# Patient Record
Sex: Female | Born: 1945 | Race: Black or African American | Hispanic: No | Smoking: Never smoker
Health system: Southern US, Community
[De-identification: ages and names within clinical notes are randomized; demographics above are authoritative.]

## PROBLEM LIST (undated history)

## (undated) ENCOUNTER — Emergency Department (HOSPITAL_COMMUNITY): Payer: MEDICAID

## (undated) DIAGNOSIS — E119 Type 2 diabetes mellitus without complications: Secondary | ICD-10-CM

## (undated) DIAGNOSIS — C3490 Malignant neoplasm of unspecified part of unspecified bronchus or lung: Secondary | ICD-10-CM

## (undated) DIAGNOSIS — M1990 Unspecified osteoarthritis, unspecified site: Secondary | ICD-10-CM

## (undated) DIAGNOSIS — I10 Essential (primary) hypertension: Secondary | ICD-10-CM

## (undated) HISTORY — PX: ABDOMINAL HYSTERECTOMY: SHX81

## (undated) HISTORY — PX: ECTOPIC PREGNANCY SURGERY: SHX613

---

## 2019-12-30 ENCOUNTER — Inpatient Hospital Stay (HOSPITAL_COMMUNITY)
Admission: EM | Admit: 2019-12-30 | Discharge: 2020-01-05 | DRG: 180 | Disposition: A | Discharge status: Home / Self Care | Payer: Medicare Other | Attending: Internal Medicine | Admitting: Internal Medicine

## 2019-12-30 ENCOUNTER — Observation Stay (HOSPITAL_COMMUNITY): Payer: Medicare Other

## 2019-12-30 ENCOUNTER — Encounter (HOSPITAL_COMMUNITY): Payer: Self-pay

## 2019-12-30 ENCOUNTER — Other Ambulatory Visit: Payer: Self-pay

## 2019-12-30 ENCOUNTER — Emergency Department (HOSPITAL_COMMUNITY): Payer: Medicare Other

## 2019-12-30 DIAGNOSIS — J9819 Other pulmonary collapse: Secondary | ICD-10-CM

## 2019-12-30 DIAGNOSIS — Z9114 Patient's other noncompliance with medication regimen: Secondary | ICD-10-CM

## 2019-12-30 DIAGNOSIS — J9602 Acute respiratory failure with hypercapnia: Secondary | ICD-10-CM | POA: Diagnosis present

## 2019-12-30 DIAGNOSIS — Z90710 Acquired absence of both cervix and uterus: Secondary | ICD-10-CM

## 2019-12-30 DIAGNOSIS — Z20822 Contact with and (suspected) exposure to covid-19: Secondary | ICD-10-CM | POA: Diagnosis present

## 2019-12-30 DIAGNOSIS — C3432 Malignant neoplasm of lower lobe, left bronchus or lung: Secondary | ICD-10-CM | POA: Diagnosis not present

## 2019-12-30 DIAGNOSIS — R0600 Dyspnea, unspecified: Secondary | ICD-10-CM | POA: Diagnosis not present

## 2019-12-30 DIAGNOSIS — E1165 Type 2 diabetes mellitus with hyperglycemia: Secondary | ICD-10-CM | POA: Diagnosis present

## 2019-12-30 DIAGNOSIS — Z9889 Other specified postprocedural states: Secondary | ICD-10-CM

## 2019-12-30 DIAGNOSIS — Z87891 Personal history of nicotine dependence: Secondary | ICD-10-CM

## 2019-12-30 DIAGNOSIS — J90 Pleural effusion, not elsewhere classified: Secondary | ICD-10-CM | POA: Diagnosis present

## 2019-12-30 DIAGNOSIS — R0609 Other forms of dyspnea: Secondary | ICD-10-CM

## 2019-12-30 DIAGNOSIS — E119 Type 2 diabetes mellitus without complications: Secondary | ICD-10-CM

## 2019-12-30 DIAGNOSIS — I313 Pericardial effusion (noninflammatory): Secondary | ICD-10-CM | POA: Diagnosis present

## 2019-12-30 DIAGNOSIS — J910 Malignant pleural effusion: Secondary | ICD-10-CM

## 2019-12-30 DIAGNOSIS — E278 Other specified disorders of adrenal gland: Secondary | ICD-10-CM | POA: Diagnosis present

## 2019-12-30 DIAGNOSIS — I5022 Chronic systolic (congestive) heart failure: Secondary | ICD-10-CM | POA: Diagnosis present

## 2019-12-30 DIAGNOSIS — I110 Hypertensive heart disease with heart failure: Secondary | ICD-10-CM | POA: Diagnosis present

## 2019-12-30 DIAGNOSIS — J9601 Acute respiratory failure with hypoxia: Secondary | ICD-10-CM | POA: Diagnosis present

## 2019-12-30 DIAGNOSIS — I10 Essential (primary) hypertension: Secondary | ICD-10-CM

## 2019-12-30 DIAGNOSIS — R590 Localized enlarged lymph nodes: Secondary | ICD-10-CM | POA: Diagnosis present

## 2019-12-30 DIAGNOSIS — Z79899 Other long term (current) drug therapy: Secondary | ICD-10-CM

## 2019-12-30 DIAGNOSIS — J9811 Atelectasis: Secondary | ICD-10-CM | POA: Diagnosis present

## 2019-12-30 DIAGNOSIS — R634 Abnormal weight loss: Secondary | ICD-10-CM | POA: Diagnosis present

## 2019-12-30 DIAGNOSIS — I3139 Other pericardial effusion (noninflammatory): Secondary | ICD-10-CM | POA: Diagnosis present

## 2019-12-30 HISTORY — DX: Type 2 diabetes mellitus without complications: E11.9

## 2019-12-30 HISTORY — DX: Essential (primary) hypertension: I10

## 2019-12-30 HISTORY — DX: Unspecified osteoarthritis, unspecified site: M19.90

## 2019-12-30 LAB — CBC WITH DIFFERENTIAL/PLATELET
Abs Immature Granulocytes: 0.02 10*3/uL (ref 0.00–0.07)
Basophils Absolute: 0 10*3/uL (ref 0.0–0.1)
Basophils Relative: 0 %
Eosinophils Absolute: 0 10*3/uL (ref 0.0–0.5)
Eosinophils Relative: 0 %
HCT: 43.1 % (ref 36.0–46.0)
Hemoglobin: 13.7 g/dL (ref 12.0–15.0)
Immature Granulocytes: 0 %
Lymphocytes Relative: 15 %
Lymphs Abs: 1 10*3/uL (ref 0.7–4.0)
MCH: 28.1 pg (ref 26.0–34.0)
MCHC: 31.8 g/dL (ref 30.0–36.0)
MCV: 88.5 fL (ref 80.0–100.0)
Monocytes Absolute: 0.4 10*3/uL (ref 0.1–1.0)
Monocytes Relative: 5 %
Neutro Abs: 5.3 10*3/uL (ref 1.7–7.7)
Neutrophils Relative %: 80 %
Platelets: 364 10*3/uL (ref 150–400)
RBC: 4.87 MIL/uL (ref 3.87–5.11)
RDW: 12.6 % (ref 11.5–15.5)
WBC: 6.6 10*3/uL (ref 4.0–10.5)
nRBC: 0 % (ref 0.0–0.2)

## 2019-12-30 LAB — CBG MONITORING, ED
Glucose-Capillary: 312 mg/dL — ABNORMAL HIGH (ref 70–99)
Glucose-Capillary: 302 mg/dL — ABNORMAL HIGH (ref 70–99)

## 2019-12-30 LAB — COMPREHENSIVE METABOLIC PANEL
ALT: 46 U/L — ABNORMAL HIGH (ref 0–44)
AST: 29 U/L (ref 15–41)
Albumin: 3.7 g/dL (ref 3.5–5.0)
Alkaline Phosphatase: 143 U/L — ABNORMAL HIGH (ref 38–126)
Anion gap: 12 (ref 5–15)
BUN: 10 mg/dL (ref 8–23)
CO2: 24 mmol/L (ref 22–32)
Calcium: 9.4 mg/dL (ref 8.9–10.3)
Chloride: 99 mmol/L (ref 98–111)
Creatinine, Ser: 0.69 mg/dL (ref 0.44–1.00)
GFR, Estimated: >60 mL/min (ref >60)
Glucose, Bld: 309 mg/dL — ABNORMAL HIGH (ref 70–99)
Potassium: 3.7 mmol/L (ref 3.5–5.1)
Sodium: 135 mmol/L (ref 135–145)
Total Bilirubin: 0.6 mg/dL (ref 0.3–1.2)
Total Protein: 8.6 g/dL — ABNORMAL HIGH (ref 6.5–8.1)

## 2019-12-30 LAB — BODY FLUID CELL COUNT WITH DIFFERENTIAL
Eos, Fluid: 0 %
Lymphs, Fluid: 81 %
Monocyte-Macrophage-Serous Fluid: 13 % — ABNORMAL LOW (ref 50–90)
Neutrophil Count, Fluid: 6 % (ref 0–25)
Total Nucleated Cell Count, Fluid: 1187 cu mm — ABNORMAL HIGH (ref 0–1000)

## 2019-12-30 LAB — RESPIRATORY PANEL BY RT PCR (FLU A&B, COVID)
Influenza A by PCR: NEGATIVE
Influenza B by PCR: NEGATIVE
SARS Coronavirus 2 by RT PCR: NEGATIVE

## 2019-12-30 LAB — BLOOD GAS, VENOUS
Acid-Base Excess: 2.8 mmol/L — ABNORMAL HIGH (ref 0.0–2.0)
Bicarbonate: 25.5 mmol/L (ref 20.0–28.0)
FIO2: 28
O2 Saturation: 49.1 %
Patient temperature: 37
pCO2, Ven: 42.6 mmHg — ABNORMAL LOW (ref 44.0–60.0)
pH, Ven: 7.418 (ref 7.250–7.430)
pO2, Ven: 31.1 mmHg — CL (ref 32.0–45.0)

## 2019-12-30 LAB — PROTEIN, PLEURAL OR PERITONEAL FLUID: Total protein, fluid: 5.1 g/dL

## 2019-12-30 LAB — HEMOGLOBIN A1C
Hgb A1c MFr Bld: 10.7 % — ABNORMAL HIGH (ref 4.8–5.6)
Mean Plasma Glucose: 260.39 mg/dL

## 2019-12-30 LAB — LACTIC ACID, PLASMA: Lactic Acid, Venous: 1.7 mmol/L (ref 0.5–1.9)

## 2019-12-30 LAB — LACTATE DEHYDROGENASE: LDH: 242 U/L — ABNORMAL HIGH (ref 98–192)

## 2019-12-30 LAB — PROTEIN, TOTAL: Total Protein: 8.1 g/dL (ref 6.5–8.1)

## 2019-12-30 LAB — LACTATE DEHYDROGENASE, PLEURAL OR PERITONEAL FLUID: LD, Fluid: 264 U/L — ABNORMAL HIGH (ref 3–23)

## 2019-12-30 MED ORDER — AMLODIPINE BESYLATE 5 MG PO TABS
5.0000 mg | ORAL_TABLET | Freq: Every day | ORAL | Status: DC
  Administered 2019-12-30 – 2020-01-05 (×7): 5 mg via ORAL
  Filled 2019-12-30 (×7): qty 1

## 2019-12-30 MED ORDER — SODIUM CHLORIDE 0.9 % IV SOLN: INTRAVENOUS | Status: DC

## 2019-12-30 MED ORDER — LABETALOL HCL 5 MG/ML IV SOLN
20.0000 mg | INTRAVENOUS | Status: DC | PRN
  Administered 2019-12-30 (×2): 20 mg via INTRAVENOUS
  Filled 2019-12-30 (×2): qty 4

## 2019-12-30 MED ORDER — IOHEXOL 300 MG/ML  SOLN
75.0000 mL | Freq: Once | INTRAMUSCULAR | Status: AC | PRN
  Administered 2019-12-30: 75 mL via INTRAVENOUS

## 2019-12-30 MED ORDER — HYDRALAZINE HCL 20 MG/ML IJ SOLN
5.0000 mg | Freq: Four times a day (QID) | INTRAMUSCULAR | Status: DC | PRN
  Administered 2019-12-30: 5 mg via INTRAVENOUS
  Filled 2019-12-30: qty 1

## 2019-12-30 MED ORDER — INSULIN ASPART 100 UNIT/ML ~~LOC~~ SOLN
0.0000 [IU] | Freq: Three times a day (TID) | SUBCUTANEOUS | Status: DC
  Administered 2019-12-30: 11 [IU] via SUBCUTANEOUS
  Administered 2019-12-31: 5 [IU] via SUBCUTANEOUS
  Administered 2019-12-31: 8 [IU] via SUBCUTANEOUS
  Administered 2019-12-31 – 2020-01-01 (×2): 2 [IU] via SUBCUTANEOUS
  Administered 2020-01-01 (×2): 5 [IU] via SUBCUTANEOUS
  Administered 2020-01-02 (×2): 8 [IU] via SUBCUTANEOUS
  Administered 2020-01-02: 3 [IU] via SUBCUTANEOUS
  Administered 2020-01-03: 5 [IU] via SUBCUTANEOUS
  Administered 2020-01-03: 8 [IU] via SUBCUTANEOUS
  Administered 2020-01-04: 3 [IU] via SUBCUTANEOUS
  Administered 2020-01-04: 8 [IU] via SUBCUTANEOUS
  Filled 2019-12-30: qty 1

## 2019-12-30 MED ORDER — INSULIN ASPART 100 UNIT/ML ~~LOC~~ SOLN
0.0000 [IU] | Freq: Every day | SUBCUTANEOUS | Status: DC
  Administered 2019-12-30: 4 [IU] via SUBCUTANEOUS
  Administered 2020-01-03: 3 [IU] via SUBCUTANEOUS
  Administered 2020-01-04: 2 [IU] via SUBCUTANEOUS
  Filled 2019-12-30: qty 1

## 2019-12-30 NOTE — ED Notes (Signed)
Pt in CT.

## 2019-12-30 NOTE — ED Notes (Signed)
Date and time results received: 12/30/19 1138   Test: VBG, PO2 Critical Value: 31.1  Name of Provider Notified: Eulis Foster

## 2019-12-30 NOTE — ED Notes (Signed)
RN notified of patient blood pressure

## 2019-12-30 NOTE — Procedures (Signed)
PreOperative Dx: LEFT pleural effusion Postoperative Dx: LEFT pleural effusion Procedure:   US guided LEFT thoracentesis Radiologist:  Thornton Papas Anesthesia:  10 ml of 1% lidocaine Specimen:  1.08 L of yellow sl coudy colored fluid EBL:   < 1 ml Complications: None

## 2019-12-30 NOTE — ED Triage Notes (Signed)
Pt presents to ED with complaints of increased SOB x 2 days. Pt states she had went to Urgent Care and was treated for bronchitis.

## 2019-12-30 NOTE — Progress Notes (Signed)
Patient admitted to 308. She is currently on 2L O2 via [place]. Her respiratory rate is 20-23 MD aware, O2 sats 100% and other vitals stable at this time. She denies pain or discomfort, will continue to monitor.

## 2019-12-30 NOTE — H&P (Signed)
TRH H&P   Patient Demographics:    Sara Ferguson, is a 74 y.o. female  MRN: [id number]   DOB - [birth year]  Admit Date - 12/30/2019  Outpatient Primary MD for the patient is System, Provider Not In  Referring MD/NP/PA: Dr Eulis Foster    Patient coming from: home  Chief Complaint  Patient presents with  . Shortness of Breath      HPI:    Sara Ferguson  is a 74 y.o. female, with past medical history of hypertension, diabetes mellitus (stopped taking Metformin on her own), history of hysterectomy in 1984, patient presents to ED secondary to shortness of breath, reported this is an ongoing problem for last 2 weeks, she was recently treated for bronchitis by urgent care, reports no improvement of symptoms which prompted her to come back to ED, reports she is an ex-smoker, but 40 years ago, but reports husband was a heavy smoker, and recently passed away due to malignancy in August/2021, she does report some cough, nonproductive, reports weakness, fatigue, she denies fever, chills, chest pain, nausea or vomiting, no focal deficits, no hemoptysis, no sick contact, she did test negative for Covid yesterday, she is not vaccinated against Covid infection. -In ED her CT chest with contrast was significant for diffuse large pleural effusion, with surrounding lymphadenopathy, and an adrenal nodule, highly suspicious for malignancy, patient report last colonoscopy in 1984, mammogram was many years ago, reports hysterectomy in 1984, given she is dyspneic [facility] consulted to admit.    Review of systems:    In addition to the HPI above,  No Fever-chills, No Headache, No changes with Vision or hearing, No problems swallowing food or Liquids, No Chest pain, does report cough and shortness of breath. No Abdominal pain, No Nausea or Vommitting, Bowel movements are regular, No Blood in stool or  Urine, No dysuria, No new skin rashes or bruises, No new joints pains-aches,  No new weakness, tingling, numbness in any extremity, No recent weight gain or loss, No polyuria, polydypsia or polyphagia, No significant Mental Stressors.  A full 10 point Review of Systems was done, except as stated above, all other Review of Systems were negative.   With Past History of the following :    Past Medical History:  Diagnosis Date  . Arthritis   . Diabetes mellitus without complication ([place])   . Hypertension       Past Surgical History:  Procedure Laterality Date  . ABDOMINAL HYSTERECTOMY    . ECTOPIC PREGNANCY SURGERY        Social History:     Social History   Tobacco Use  . Smoking status: Never Smoker  . Smokeless tobacco: Never Used  Substance Use Topics  . Alcohol use: Not Currently        Family History :   She reports family history of lung cancer in her spouse recently.  As well  report mother died of liver cancer, but otherwise no family history of malignancy.   Home Medications:   Prior to Admission medications   Medication Sig Start Date End Date Taking? Authorizing Provider  albuterol (VENTOLIN HFA) 108 (90 Base) MCG/ACT inhaler Inhale into the lungs. 12/17/19  Yes [provider]  lisinopril (ZESTRIL) 2.5 MG tablet Take 2.5 mg by mouth daily. 10/23/19  Yes [provider]     Allergies:    No Known Allergies   Physical Exam:   Vitals  Blood pressure (!) 182/89, pulse 98, temperature 98.6 F (37 C), temperature source Oral, resp. rate (!) 27, height 5\' 4"  (1.626 m), weight 72.6 kg, SpO2 96 %.   1. General elderly female, sitting position due to dyspnea, in mild discomfort.    2. Normal affect and insight, Not Suicidal or Homicidal, Awake Alert, Oriented X 3.  3. No F.N deficits, ALL C.Nerves Intact, Strength 5/5 all 4 extremities, Sensation intact all 4 extremities, Plantars down going.  4. Ears and Eyes appear Normal,  Conjunctivae clear, PERRLA. Moist Oral Mucosa.  5. Supple Neck, No JVD, some fullness in the left axilla with suspicion of lymphadenopathy.  6. Symmetrical Chest wall movement, some increased work of breathing, significantly diminished air entry in the left side.  7. RRR, No Gallops, Rubs or Murmurs, No Parasternal Heave.  8. Positive Bowel Sounds, Abdomen Soft, No tenderness, No organomegaly appriciated,No rebound -guarding or rigidity.  9.  No Cyanosis, Normal Skin Turgor, No Skin Rash or Bruise.  10. Good muscle tone,  joints appear normal , no effusions, Normal ROM.   female chaperone RN Rolena Infante was present during physical exam and breast exam.   Data Review:    CBC Recent Labs  Lab 12/30/19 1118  WBC 6.6  HGB 13.7  HCT 43.1  PLT 364  MCV 88.5  MCH 28.1  MCHC 31.8  RDW 12.6  LYMPHSABS 1.0  MONOABS 0.4  EOSABS 0.0  BASOSABS 0.0   ------------------------------------------------------------------------------------------------------------------  Chemistries  Recent Labs  Lab 12/30/19 1118  NA 135  K 3.7  CL 99  CO2 24  GLUCOSE 309*  BUN 10  CREATININE 0.69  CALCIUM 9.4  AST 29  ALT 46*  ALKPHOS 143*  BILITOT 0.6   ------------------------------------------------------------------------------------------------------------------ estimated creatinine clearance is 58.5 mL/min (by C-G formula based on SCr of 0.69 mg/dL). ------------------------------------------------------------------------------------------------------------------ No results for input(s): TSH, T4TOTAL, T3FREE, THYROIDAB in the last 72 hours.  Invalid input(s): FREET3  Coagulation profile No results for input(s): INR, PROTIME in the last 168 hours. ------------------------------------------------------------------------------------------------------------------- No results for input(s): DDIMER in the last 72  hours. -------------------------------------------------------------------------------------------------------------------  Cardiac Enzymes No results for input(s): CKMB, TROPONINI, MYOGLOBIN in the last 168 hours.  Invalid input(s): CK ------------------------------------------------------------------------------------------------------------------ No results found for: BNP   ---------------------------------------------------------------------------------------------------------------  Urinalysis No results found for: COLORURINE, APPEARANCEUR, [place], [place], [place], [place], [place], [place], PROTEINUR, UROBILINOGEN, NITRITE, LEUKOCYTESUR  ----------------------------------------------------------------------------------------------------------------   Imaging Results:    CT Chest W Contrast  Result Date: 12/30/2019 CLINICAL DATA:  Large left pleural effusion on chest x-ray. EXAM: CT CHEST WITH CONTRAST TECHNIQUE: Multidetector CT imaging of the chest was performed during intravenous contrast administration. CONTRAST:  47mL OMNIPAQUE IOHEXOL 300 MG/ML  SOLN COMPARISON:  Chest x-ray earlier today. FINDINGS: Cardiovascular: Heart is enlarged. Small to moderate pericardial effusion. Atherosclerotic calcification is noted in the wall of the thoracic aorta. Mediastinum/Nodes: Supraclavicular, thoracic inlet, mediastinal, left internal mammary and bilateral hilar lymphadenopathy evident. Index precarinal lymph node measures 2.5 cm short axis on image  44/series 3. Subcarinal lymph node measures 2.0 cm short axis on 63/3. 1.6 cm short axis right hilar node visible on 57/3 1.6 cm soft tissue nodule in the anterior left hilum is probably a lymph node (image 50/3). Tiny hiatal hernia. The esophagus has normal imaging features. No right axillary lymphadenopathy. 13 mm short axis left subpectoral node is visible on 28/3 with mild left axillary lymphadenopathy evident. Lungs/Pleura: Large left  pleural effusion has anterior loculated component. Some aerated lung noted left upper lobe with otherwise near complete collapse of the left upper and lower lobes. Multiple small enhancing pleural nodules are seen in the posterior left hemithorax (paraspinal on 70/3, posterior on 107/3, and posterior on 113/3. Upper Abdomen: 2.4 cm left adrenal nodule cannot be definitively characterized. No definite focal lesion within the visualized liver. Musculoskeletal: No worrisome lytic or sclerotic osseous abnormality. No obvious left breast mass although the entire left breast has not been included in the field of view. IMPRESSION: 1. Large left pleural effusion has anterior loculated component and near complete collapse of the left upper and lower lobes. 2. Supraclavicular, mediastinal, bilateral hilar, and left subpectoral/axillary lymphadenopathy consistent with metastatic disease. No primary lesion evident although left lung cancer or left breast cancer (left breast incompletely visualized on this study) would be distinct considerations. 3. 2.4 cm left adrenal nodule cannot be definitively characterized. Metastatic disease a concern. 4. Small to moderate pericardial effusion. 5. Aortic Atherosclerosis (ICD10-I70.0). Electronically Signed   By: Misty Stanley M.D.   On: 12/30/2019 14:29   DG Chest Portable 1 View  Result Date: 12/30/2019 CLINICAL DATA:  74 year old female with shortness of breath EXAM: PORTABLE CHEST 1 VIEW COMPARISON:  None. FINDINGS: Opacity of the left chest obscures the left hemidiaphragm and the left heart border with meniscus at the top of the opacity. No pneumothorax. Coarsened interstitial markings. No confluent airspace disease within the right lung. IMPRESSION: Large left pleural effusion with associated atelectasis/consolidation. Electronically Signed   By: Corrie Mckusick D.O.   On: 12/30/2019 10:21       Assessment & Plan:    Active Problems:   Essential hypertension   Pleural  effusion, malignant   Pleural effusion    Malignant large left pleural effusion -Patient presents with progressive dyspnea, CT chest with evidence of large pleural effusion with near complete collapse of left upper and lower lobes, and metastatic lymph nodes in supraclavicular, mediastinal and bilateral hilar and left sup pectoral/axillary lymphadenopathy, 2.3 cm left adrenal nodule as well, with small to moderate pericardial effusion. -Is most likely malignant, so far it is unclear, given she is symptomatic with significant dyspnea, will proceed with thoracentesis and will check cytology. -We will obtain CT abdomen and pelvis tomorrow after IV hydration as she received IV contrast today. -Encouraged use incentive spirometer, -Reports she had hysterectomy in 1984, she had colonoscopy then as well, no colonoscopy since, reports she did not have any mammograms for so many years, she denies history of smoking, but reports husband history of heavy smoking for many years, he passed away this 10-21-2022 due to cancer.  Hypertension -Blood pressure is elevated due to discomfort, will resume home dose lisinopril, will add as needed hydralazine, and blood pressure remains significantly elevated so she will be started on 5 mg of Norvasc.  Diabetes mellitus, type II, uncontrolled with hyperglycemia -Reports history of diabetes mellitus, she is supposed to be on Metformin but she did stop it herself for a while, glucose is elevated at 309, she will be  started on insulin sliding scale during hospital stay, and likely will need to be started on glipizide upon discharge (unlikely Metformin as she did receive IV contrast), and she will be kept on insulin sliding scale during hospital stay .   DVT Prophylaxis Heparin -with a start date tomorrow afternoon  AM Labs Ordered, also please review Full Orders  Family Communication: Admission, patients condition and plan of care including tests being ordered have been  discussed with the patient  who indicate understanding and agree with the plan and Code Status.  Code Status Full  Likely DC to  Home  Condition GUARDED    Consults called: none    Admission status: observation    Time spent in minutes : 60 minutes   Phillips Climes M.D on 12/30/2019 at 4:40 PM   [facility] - Office  [phone]

## 2019-12-30 NOTE — ED Notes (Signed)
Pt in bed, pt placed on 2L O2 via [place], secondary to decreased O2 sat, pt denies pain at this time, blood and blood culture number one drawn via IV start.  Lab at bedside for blood culture number two.

## 2019-12-30 NOTE — ED Provider Notes (Signed)
[facility] EMERGENCY DEPARTMENT Provider Note   CSN: [id number] Arrival date & time: 12/30/19  1443     History Chief Complaint  Patient presents with   Shortness of Breath    Sara Ferguson is a 74 y.o. female.  HPI She presents for evaluation of shortness of breath for 2 weeks, without improvement after treated with antibiotics and guaifenesin for "bronchitis."  She followed up with the urgent care again, yesterday and was referred to the ER for further evaluation.  She is an ex-smoker, and is continued to produce clear sputum with cough.  She denies chest pain, fever, chills, nausea, vomiting, weakness or dizziness.  She has lost some weight in the last few weeks.  No known sick contacts.  She states she tested negative for Covid, yesterday.  She has not been vaccinated, against Covid infection.  There are no other known modifying factors.    Past Medical History:  Diagnosis Date   Arthritis    Diabetes mellitus without complication ([place])    Hypertension     There are no problems to display for this patient.   Past Surgical History:  Procedure Laterality Date   ABDOMINAL HYSTERECTOMY     ECTOPIC PREGNANCY SURGERY       OB History   No obstetric history on file.     No family history on file.  Social History   Tobacco Use   Smoking status: Never Smoker   Smokeless tobacco: Never Used  Substance Use Topics   Alcohol use: Not Currently   Drug use: Never    Home Medications Prior to Admission medications   Medication Sig Start Date End Date Taking? Authorizing Provider  albuterol (VENTOLIN HFA) 108 (90 Base) MCG/ACT inhaler Inhale into the lungs. 12/17/19  Yes [provider]  lisinopril (ZESTRIL) 2.5 MG tablet Take 2.5 mg by mouth daily. 10/23/19  Yes [provider]    Allergies    Patient has no known allergies.  Review of Systems   Review of Systems  All other systems reviewed and are negative.   Physical Exam Updated  Vital Signs BP (!) 165/80    Pulse 98    Temp 98.6 F (37 C) (Oral)    Resp (!) 22    Ht $R'5\' 4"'HB$  (1.626 m)    Wt 72.6 kg    SpO2 94%    BMI 27.46 kg/m   Physical Exam Vitals and nursing note reviewed.  Constitutional:      General: She is in acute distress.     Appearance: She is well-developed. She is ill-appearing. She is not toxic-appearing or diaphoretic.  HENT:     Head: Normocephalic and atraumatic.     Right Ear: External ear normal.     Left Ear: External ear normal.  Eyes:     Conjunctiva/sclera: Conjunctivae normal.     Pupils: Pupils are equal, round, and reactive to light.  Neck:     Trachea: Phonation normal.  Cardiovascular:     Rate and Rhythm: Normal rate and regular rhythm.     Heart sounds: Normal heart sounds.  Pulmonary:     Effort: Pulmonary effort is normal. No respiratory distress.     Comments: Tachypneic.  Decreased air movement, left.  No wheezes.  No increased work of breathing. Chest:     Comments: Breast exam: Bilateral normal-appearing breast, with normal nipples.  On palpation, nonspecific nodularity, some of which is plate-like, on both breasts.  No distinct areas of large mass concerning  for tumor. Abdominal:     General: There is no distension.     Palpations: Abdomen is soft.     Tenderness: There is no abdominal tenderness.  Musculoskeletal:        General: No tenderness. Normal range of motion.     Cervical back: Normal range of motion and neck supple.     Right lower leg: Edema present.     Left lower leg: Edema present.     Comments: 1+ bilateral lower extremity edema  Skin:    General: Skin is warm and dry.  Neurological:     Mental Status: She is alert and oriented to person, place, and time.     Cranial Nerves: No cranial nerve deficit.     Sensory: No sensory deficit.     Motor: No abnormal muscle tone.     Coordination: Coordination normal.  Psychiatric:        Mood and Affect: Mood normal.        Behavior: Behavior normal.          Thought Content: Thought content normal.        Judgment: Judgment normal.     ED Results / Procedures / Treatments   Labs (all labs ordered are listed, but only abnormal results are displayed) Labs Reviewed  BLOOD GAS, VENOUS - Abnormal; Notable for the following components:      Result Value   pCO2, Ven 42.6 (*)    pO2, Ven 31.1 (*)    Acid-Base Excess 2.8 (*)    All other components within normal limits  COMPREHENSIVE METABOLIC PANEL - Abnormal; Notable for the following components:   Glucose, Bld 309 (*)    Total Protein 8.6 (*)    ALT 46 (*)    Alkaline Phosphatase 143 (*)    All other components within normal limits  CULTURE, BLOOD (ROUTINE X 2)  CULTURE, BLOOD (ROUTINE X 2)  CBC WITH DIFFERENTIAL/PLATELET  LACTIC ACID, PLASMA    EKG None    Date: 12/30/2019  Rate: 127  Rhythm: sinus tachycardia  QRS Axis: left  PR and QT Intervals: normal  ST/T Wave abnormalities: normal  PR and QRS Conduction Disutrbances:none     Radiology CT Chest W Contrast  Result Date: 12/30/2019 CLINICAL DATA:  Large left pleural effusion on chest x-ray. EXAM: CT CHEST WITH CONTRAST TECHNIQUE: Multidetector CT imaging of the chest was performed during intravenous contrast administration. CONTRAST:  72mL OMNIPAQUE IOHEXOL 300 MG/ML  SOLN COMPARISON:  Chest x-ray earlier today. FINDINGS: Cardiovascular: Heart is enlarged. Small to moderate pericardial effusion. Atherosclerotic calcification is noted in the wall of the thoracic aorta. Mediastinum/Nodes: Supraclavicular, thoracic inlet, mediastinal, left internal mammary and bilateral hilar lymphadenopathy evident. Index precarinal lymph node measures 2.5 cm short axis on image 44/series 3. Subcarinal lymph node measures 2.0 cm short axis on 63/3. 1.6 cm short axis right hilar node visible on 57/3 1.6 cm soft tissue nodule in the anterior left hilum is probably a lymph node (image 50/3). Tiny hiatal hernia. The esophagus has normal imaging  features. No right axillary lymphadenopathy. 13 mm short axis left subpectoral node is visible on 28/3 with mild left axillary lymphadenopathy evident. Lungs/Pleura: Large left pleural effusion has anterior loculated component. Some aerated lung noted left upper lobe with otherwise near complete collapse of the left upper and lower lobes. Multiple small enhancing pleural nodules are seen in the posterior left hemithorax (paraspinal on 70/3, posterior on 107/3, and posterior on 113/3. Upper Abdomen: 2.4 cm  left adrenal nodule cannot be definitively characterized. No definite focal lesion within the visualized liver. Musculoskeletal: No worrisome lytic or sclerotic osseous abnormality. No obvious left breast mass although the entire left breast has not been included in the field of view. IMPRESSION: 1. Large left pleural effusion has anterior loculated component and near complete collapse of the left upper and lower lobes. 2. Supraclavicular, mediastinal, bilateral hilar, and left subpectoral/axillary lymphadenopathy consistent with metastatic disease. No primary lesion evident although left lung cancer or left breast cancer (left breast incompletely visualized on this study) would be distinct considerations. 3. 2.4 cm left adrenal nodule cannot be definitively characterized. Metastatic disease a concern. 4. Small to moderate pericardial effusion. 5. Aortic Atherosclerosis (ICD10-I70.0). Electronically Signed   By: Misty Stanley M.D.   On: 12/30/2019 14:29   DG Chest Portable 1 View  Result Date: 12/30/2019 CLINICAL DATA:  74 year old female with shortness of breath EXAM: PORTABLE CHEST 1 VIEW COMPARISON:  None. FINDINGS: Opacity of the left chest obscures the left hemidiaphragm and the left heart border with meniscus at the top of the opacity. No pneumothorax. Coarsened interstitial markings. No confluent airspace disease within the right lung. IMPRESSION: Large left pleural effusion with associated  atelectasis/consolidation. Electronically Signed   By: Corrie Mckusick D.O.   On: 12/30/2019 10:21    Procedures .Critical Care Performed by: Daleen Bo, MD Authorized by: Daleen Bo, MD   Critical care provider statement:    Critical care time (minutes):  50   Critical care start time:  12/30/2019 10:55 AM   Critical care end time:  12/30/2019 3:13 PM   Critical care time was exclusive of:  Separately billable procedures and treating other patients   Critical care was necessary to treat or prevent imminent or life-threatening deterioration of the following conditions:  Respiratory failure   Critical care was time spent personally by me on the following activities:  Blood draw for specimens, development of treatment plan with patient or surrogate, discussions with consultants, evaluation of patient's response to treatment, examination of patient, obtaining history from patient or surrogate, ordering and performing treatments and interventions, ordering and review of laboratory studies, pulse oximetry, re-evaluation of patient's condition, review of old charts and ordering and review of radiographic studies   (including critical care time)  Medications Ordered in ED Medications  0.9 %  sodium chloride infusion ( Intravenous New Bag/Given 12/30/19 1124)  labetalol (NORMODYNE) injection 20 mg (20 mg Intravenous Given 12/30/19 1423)  iohexol (OMNIPAQUE) 300 MG/ML solution 75 mL (75 mLs Intravenous Contrast Given 12/30/19 1355)    ED Course  I have reviewed the triage vital signs and the nursing notes.  Pertinent labs & imaging results that were available during my care of the patient were reviewed by me and considered in my medical decision making (see chart for details).      Clinical Course as of Dec 30 1511  Fri Dec 30, 2019  1438 Patient with initial room air oxygen saturation 92% on arrival.  Trial now, off oxygen, within 5 minutes she became tachypneic and had increased  work of breathing and became subjectively dyspneic.  Oxygen replaced for comfort.   [EW]    Clinical Course User Index [EW] Daleen Bo, MD   MDM Rules/Calculators/A&P                           Patient Vitals for the past 24 hrs:  BP Temp Temp src Pulse Resp SpO2  Height Weight  12/30/19 1446 (!) 165/80 -- -- 98 (!) 22 94 % -- --  12/30/19 1425 (!) 178/94 98.6 F (37 C) Oral (!) 101 (!) 23 100 % -- --  12/30/19 1341 (!) 162/94 -- -- (!) 101 (!) 24 94 % -- --  12/30/19 1330 (!) 162/80 -- -- 100 (!) 24 97 % -- --  12/30/19 1315 (!) 190/82 -- -- (!) 118 (!) 24 100 % -- --  12/30/19 1313 (!) 190/82 99 F (37.2 C) Oral (!) 124 (!) 24 98 % -- --  12/30/19 1230 (!) 220/114 -- -- (!) 126 (!) 30 99 % -- --  12/30/19 1215 -- -- -- (!) 122 (!) 30 99 % -- --  12/30/19 1201 (!) 213/105 -- -- (!) 124 (!) 27 99 % -- --  12/30/19 1200 (!) 201/100 -- -- (!) 123 (!) 31 99 % -- --  12/30/19 1145 -- -- -- (!) 123 (!) 30 98 % -- --  12/30/19 1130 (!) 201/92 -- -- (!) 123 (!) 23 99 % -- --  12/30/19 1126 -- -- -- -- -- 98 % -- --  12/30/19 1125 (!) 229/110 98.6 F (37 C) Oral (!) 129 (!) 28 92 % -- --  12/30/19 1115 -- -- -- (!) 123 (!) 33 94 % -- --  12/30/19 1100 (!) 228/107 -- -- (!) 123 (!) 29 92 % -- --  12/30/19 1056 (!) 218/113 98.7 F (37.1 C) Oral (!) 123 (!) 27 92 % -- --  12/30/19 1045 -- -- -- (!) 123 (!) 28 91 % -- --  12/30/19 1030 (!) 212/108 -- -- (!) 123 (!) 26 91 % -- --  12/30/19 0957 (!) 212/114 98.1 F (36.7 C) Oral (!) 127 (!) 24 93 % -- --  12/30/19 0956 -- -- -- -- -- -- $Rem'5\' 4"'VBZe$  (1.626 m) 72.6 kg    3:07 PM Reevaluation with update and discussion. After initial assessment and treatment, an updated evaluation reveals patient is uncomfortable.  She is agreeable to hospitalization.  Blood pressure improved after IV labetalol.Daleen Bo   Medical Decision Making:  This patient is presenting for evaluation of shortness of breath, which does require a range of treatment  options, and is a complaint that involves a high risk of morbidity and mortality. The differential diagnoses include pneumonia, bronchitis, acute lung disorder. I decided to review old records, and in summary elderly female, recently treated for bronchitis without improvement presenting for persistent symptoms.  I did not require additional historical information from anyone.  Clinical Laboratory Tests Ordered, included lactate, CBC, Metabolic panel and Urinalysis. Review indicates normal lactate, glucose elevated, ALT high, alk phos  high. Radiologic Tests Ordered, included chest x-ray, CT chest.  I independently Visualized: Radiographic images, which show left pleural effusion, left lung collapse, adenopathy left chest  Cardiac Monitor Tracing which shows normal sinus rhythm    Critical Interventions-clinical evaluation, laboratory testing, chest x-ray, advanced imaging chest, stabilization of blood pressure with IV labetalol observation reassessment  After These Interventions, the Patient was reevaluated and was found to be uncomfortable will require hospitalization for symptomatic pleural effusion.  Initial evaluation is concerning for metastatic lung cancer, etiology not clear.  Abnormal breast exam without clear evidence for breast cancer.  She has had mammograms in the past but none recently.  While not hypoxic, she does develop respiratory distress with removal oxygen, and has significant lung collapse, which will likely require hospitalization, and likely therapeutic/diagnostic thoracocentesis.  Patient  does not currently have a cancer history.  CRITICAL CARE-yes Performed by: Daleen Bo  Nursing Notes Reviewed/ Care Coordinated Applicable Imaging Reviewed Interpretation of Laboratory Data incorporated into ED treatment  3:18 PM-Consult complete with hospitalist. Patient case explained and discussed.  He agrees to admit patient for further evaluation and treatment. Call ended at  4:15 PM    Final Clinical Impression(s) / ED Diagnoses Final diagnoses:  Pleural effusion  Lung collapse  Hypertension, unspecified type  Collapse of left lung    Rx / DC Orders ED Discharge Orders    None       Daleen Bo, MD 12/30/19 1623

## 2019-12-31 ENCOUNTER — Observation Stay (HOSPITAL_COMMUNITY): Payer: Medicare Other

## 2019-12-31 ENCOUNTER — Encounter (HOSPITAL_COMMUNITY): Payer: Self-pay | Admitting: Radiology

## 2019-12-31 DIAGNOSIS — Z9114 Patient's other noncompliance with medication regimen: Secondary | ICD-10-CM | POA: Diagnosis not present

## 2019-12-31 DIAGNOSIS — R0600 Dyspnea, unspecified: Secondary | ICD-10-CM | POA: Diagnosis present

## 2019-12-31 DIAGNOSIS — J9602 Acute respiratory failure with hypercapnia: Secondary | ICD-10-CM | POA: Diagnosis present

## 2019-12-31 DIAGNOSIS — E1165 Type 2 diabetes mellitus with hyperglycemia: Secondary | ICD-10-CM | POA: Diagnosis present

## 2019-12-31 DIAGNOSIS — R590 Localized enlarged lymph nodes: Secondary | ICD-10-CM | POA: Diagnosis present

## 2019-12-31 DIAGNOSIS — J910 Malignant pleural effusion: Secondary | ICD-10-CM | POA: Diagnosis present

## 2019-12-31 DIAGNOSIS — E119 Type 2 diabetes mellitus without complications: Secondary | ICD-10-CM

## 2019-12-31 DIAGNOSIS — I5022 Chronic systolic (congestive) heart failure: Secondary | ICD-10-CM | POA: Diagnosis present

## 2019-12-31 DIAGNOSIS — C3432 Malignant neoplasm of lower lobe, left bronchus or lung: Secondary | ICD-10-CM | POA: Diagnosis present

## 2019-12-31 DIAGNOSIS — I110 Hypertensive heart disease with heart failure: Secondary | ICD-10-CM | POA: Diagnosis present

## 2019-12-31 DIAGNOSIS — E278 Other specified disorders of adrenal gland: Secondary | ICD-10-CM | POA: Diagnosis present

## 2019-12-31 DIAGNOSIS — I3139 Other pericardial effusion (noninflammatory): Secondary | ICD-10-CM | POA: Diagnosis present

## 2019-12-31 DIAGNOSIS — Z90710 Acquired absence of both cervix and uterus: Secondary | ICD-10-CM | POA: Diagnosis not present

## 2019-12-31 DIAGNOSIS — J9601 Acute respiratory failure with hypoxia: Secondary | ICD-10-CM | POA: Diagnosis present

## 2019-12-31 DIAGNOSIS — R634 Abnormal weight loss: Secondary | ICD-10-CM | POA: Diagnosis present

## 2019-12-31 DIAGNOSIS — J90 Pleural effusion, not elsewhere classified: Secondary | ICD-10-CM | POA: Diagnosis present

## 2019-12-31 DIAGNOSIS — J9811 Atelectasis: Secondary | ICD-10-CM | POA: Diagnosis present

## 2019-12-31 DIAGNOSIS — Z20822 Contact with and (suspected) exposure to covid-19: Secondary | ICD-10-CM | POA: Diagnosis present

## 2019-12-31 DIAGNOSIS — Z79899 Other long term (current) drug therapy: Secondary | ICD-10-CM | POA: Diagnosis not present

## 2019-12-31 DIAGNOSIS — I313 Pericardial effusion (noninflammatory): Secondary | ICD-10-CM | POA: Diagnosis present

## 2019-12-31 DIAGNOSIS — Z87891 Personal history of nicotine dependence: Secondary | ICD-10-CM | POA: Diagnosis not present

## 2019-12-31 LAB — GLUCOSE, CAPILLARY
Glucose-Capillary: 127 mg/dL — ABNORMAL HIGH (ref 70–99)
Glucose-Capillary: 221 mg/dL — ABNORMAL HIGH (ref 70–99)
Glucose-Capillary: 275 mg/dL — ABNORMAL HIGH (ref 70–99)
Glucose-Capillary: 143 mg/dL — ABNORMAL HIGH (ref 70–99)

## 2019-12-31 LAB — CBC
HCT: 42.3 % (ref 36.0–46.0)
Hemoglobin: 13.3 g/dL (ref 12.0–15.0)
MCH: 28.2 pg (ref 26.0–34.0)
MCHC: 31.4 g/dL (ref 30.0–36.0)
MCV: 89.8 fL (ref 80.0–100.0)
Platelets: 372 10*3/uL (ref 150–400)
RBC: 4.71 MIL/uL (ref 3.87–5.11)
RDW: 12.9 % (ref 11.5–15.5)
WBC: 7 10*3/uL (ref 4.0–10.5)
nRBC: 0 % (ref 0.0–0.2)

## 2019-12-31 LAB — COMPREHENSIVE METABOLIC PANEL
ALT: 35 U/L (ref 0–44)
AST: 21 U/L (ref 15–41)
Albumin: 3.1 g/dL — ABNORMAL LOW (ref 3.5–5.0)
Alkaline Phosphatase: 114 U/L (ref 38–126)
Anion gap: 10 (ref 5–15)
BUN: 16 mg/dL (ref 8–23)
CO2: 25 mmol/L (ref 22–32)
Calcium: 9.3 mg/dL (ref 8.9–10.3)
Chloride: 102 mmol/L (ref 98–111)
Creatinine, Ser: 0.7 mg/dL (ref 0.44–1.00)
GFR, Estimated: >60 mL/min (ref >60)
Glucose, Bld: 131 mg/dL — ABNORMAL HIGH (ref 70–99)
Potassium: 4 mmol/L (ref 3.5–5.1)
Sodium: 137 mmol/L (ref 135–145)
Total Bilirubin: 0.7 mg/dL (ref 0.3–1.2)
Total Protein: 7.4 g/dL (ref 6.5–8.1)

## 2019-12-31 MED ORDER — IPRATROPIUM-ALBUTEROL 0.5-2.5 (3) MG/3ML IN SOLN
3.0000 mL | Freq: Three times a day (TID) | RESPIRATORY_TRACT | Status: DC
  Administered 2020-01-01 – 2020-01-04 (×10): 3 mL via RESPIRATORY_TRACT
  Filled 2019-12-31 (×9): qty 3

## 2019-12-31 MED ORDER — HEPARIN SODIUM (PORCINE) 5000 UNIT/ML IJ SOLN
5000.0000 [IU] | Freq: Three times a day (TID) | INTRAMUSCULAR | Status: DC
  Administered 2019-12-31 – 2020-01-05 (×15): 5000 [IU] via SUBCUTANEOUS
  Filled 2019-12-31 (×16): qty 1

## 2019-12-31 MED ORDER — FUROSEMIDE 10 MG/ML IJ SOLN
40.0000 mg | Freq: Once | INTRAMUSCULAR | Status: AC
  Administered 2019-12-31: 40 mg via INTRAVENOUS
  Filled 2019-12-31: qty 4

## 2019-12-31 MED ORDER — IOHEXOL 300 MG/ML  SOLN
80.0000 mL | Freq: Once | INTRAMUSCULAR | Status: AC | PRN
  Administered 2019-12-31: 80 mL via INTRAVENOUS

## 2019-12-31 MED ORDER — ACETAMINOPHEN 650 MG RE SUPP: 650.0000 mg | Freq: Four times a day (QID) | RECTAL | Status: DC | PRN

## 2019-12-31 MED ORDER — IPRATROPIUM-ALBUTEROL 0.5-2.5 (3) MG/3ML IN SOLN
3.0000 mL | Freq: Four times a day (QID) | RESPIRATORY_TRACT | Status: DC
  Administered 2019-12-31: 3 mL via RESPIRATORY_TRACT
  Filled 2019-12-31: qty 3

## 2019-12-31 MED ORDER — LISINOPRIL 5 MG PO TABS
2.5000 mg | ORAL_TABLET | Freq: Every day | ORAL | Status: DC
  Administered 2019-12-31 – 2020-01-02 (×3): 2.5 mg via ORAL
  Filled 2019-12-31 (×3): qty 1

## 2019-12-31 MED ORDER — HYDROCODONE-ACETAMINOPHEN 5-325 MG PO TABS: 1.0000 | ORAL_TABLET | ORAL | Status: DC | PRN

## 2019-12-31 MED ORDER — ACETAMINOPHEN 325 MG PO TABS: 650.0000 mg | ORAL_TABLET | Freq: Four times a day (QID) | ORAL | Status: DC | PRN

## 2019-12-31 NOTE — Progress Notes (Signed)
   12/31/19 0300  Assess: MEWS Score  Temp 98 F (36.7 C)  BP 129/82  Pulse Rate 97  Resp 19  Level of Consciousness Alert  SpO2 96 %  O2 Device Nasal Cannula  O2 Flow Rate (L/min) 2 L/min  Assess: MEWS Score  MEWS Temp 0  MEWS Systolic 0  MEWS Pulse 0  MEWS RR 0  MEWS LOC 0  MEWS Score 0  MEWS Score Color Green  Treat  Pain Scale 0-10  Pain Score 0  Document  Patient Outcome Stabilized after interventions   Patient states she feels much better, is resting comfortably at this time. Q2 hour vitals have been completed x2 and Q4 will resume per unit protocol. Will continue to monitor.

## 2019-12-31 NOTE — Progress Notes (Signed)
PROGRESS NOTE    Sara Ferguson  MAU:[id number] DOB: November 08, 1945 DOA: 12/30/2019 PCP: System, Provider Not In    Brief Narrative:  74 year old female with a history of hypertension, diabetes, presents to the emergency room with progressive shortness of breath.  She was found to have large left-sided pleural effusion as well as pericardial effusion and was admitted for further treatment.   Assessment & Plan:   Active Problems:   Essential hypertension   Pleural effusion, malignant   Pleural effusion   Pleural effusion, left   Type 2 diabetes mellitus without complication (HCC)   Acute respiratory failure with hypoxia (HCC)   Pericardial effusion   Large left pleural effusion -Concern for underlying malignancy -Status post thoracentesis on 11/12 -Cytology in process -Appears to be exudative on fluid analysis  Acute respiratory failure with hypoxia -Likely related to pleural effusion -She is status post thoracentesis -Still requiring supplemental oxygen -She is still dyspneic on exertion as well as with conversation -Continue to wean off oxygen as tolerated  Pericardial effusion with bilateral pleural effusions -She does have some evidence of volume overload with peripheral edema -May also have concurrent CHF -Check BNP -Check echocardiogram -We will give a dose of IV Lasix  Type 2 diabetes -A1c 10.7 -Currently on sliding scale insulin -Continue to monitor  Hypertension -Currently on amlodipine and lisinopril -Blood pressure currently stable   DVT prophylaxis: heparin injection 5,000 Units Start: 12/31/19 1400 SCDs Start: 12/31/19 0218  Code Status: Full code Family Communication: Discussed with patient.  She did not want me to contact any family Disposition Plan: Status is: Inpatient  Remains inpatient appropriate because:Inpatient level of care appropriate due to severity of illness   Dispo: The patient is from: Home              Anticipated d/c is to:  Home              Anticipated d/c date is: 2 days              Patient currently is not medically stable to d/c.         Consultants:     Procedures:   11/12 ultrasound-guided thoracentesis with removal of 1.08 L of fluid  Antimicrobials:      Subjective: Initially felt better after thoracentesis.  Since then, she feels like she is developing worsening shortness of breath again.  She is having difficulty completing a sentence.  Objective: Vitals:   12/31/19 0300 12/31/19 0900 12/31/19 0908 12/31/19 1457  BP: 129/82 (!) 142/60 140/64 (!) 168/78  Pulse: 97 (!) 111 98 97  Resp: 19 19 18 17   Temp: 98 F (36.7 C) 99.2 F (37.3 C) 99 F (37.2 C) 98.3 F (36.8 C)  TempSrc: Oral Oral    SpO2: 96% 99% 99% 98%  Weight:      Height:        Intake/Output Summary (Last 24 hours) at 12/31/2019 1954 Last data filed at 12/31/2019 1700 Gross per 24 hour  Intake 1310.56 ml  Output --  Net 1310.56 ml   Filed Weights   12/30/19 0956 12/30/19 2320  Weight: 72.6 kg 72.6 kg    Examination:  General exam: Appears calm and comfortable  Respiratory system: Crackles at bases with diminished breath sounds at left base. Respiratory effort normal. Cardiovascular system: S1 & S2 heard, RRR. No JVD, murmurs, rubs, gallops or clicks.  1+ pitting edema bilaterally Gastrointestinal system: Abdomen is nondistended, soft and nontender. No organomegaly or masses felt. Normal  bowel sounds heard. Central nervous system: Alert and oriented. No focal neurological deficits. Extremities: Symmetric 5 x 5 power. Skin: No rashes, lesions or ulcers Psychiatry: Judgement and insight appear normal. Mood & affect appropriate.     Data Reviewed: I have personally reviewed following labs and imaging studies  CBC: Recent Labs  Lab 12/30/19 1118 12/31/19 0656  WBC 6.6 7.0  NEUTROABS 5.3  --   HGB 13.7 13.3  HCT 43.1 42.3  MCV 88.5 89.8  PLT 364 732   Basic Metabolic Panel: Recent Labs   Lab 12/30/19 1118 12/31/19 0656  NA 135 137  K 3.7 4.0  CL 99 102  CO2 24 25  GLUCOSE 309* 131*  BUN 10 16  CREATININE 0.69 0.70  CALCIUM 9.4 9.3   GFR: Estimated Creatinine Clearance: 58.5 mL/min (by C-G formula based on SCr of 0.7 mg/dL). Liver Function Tests: Recent Labs  Lab 12/30/19 1118 12/30/19 1646 12/31/19 0656  AST 29  --  21  ALT 46*  --  35  ALKPHOS 143*  --  114  BILITOT 0.6  --  0.7  PROT 8.6* 8.1 7.4  ALBUMIN 3.7  --  3.1*   No results for input(s): LIPASE, AMYLASE in the last 168 hours. No results for input(s): AMMONIA in the last 168 hours. Coagulation Profile: No results for input(s): INR, PROTIME in the last 168 hours. Cardiac Enzymes: No results for input(s): CKTOTAL, CKMB, CKMBINDEX, TROPONINI in the last 168 hours. BNP (last 3 results) No results for input(s): PROBNP in the last 8760 hours. HbA1C: Recent Labs    12/30/19 1118  HGBA1C 10.7*   CBG: Recent Labs  Lab 12/30/19 1658 12/30/19 2216 12/31/19 0807 12/31/19 1116 12/31/19 1657  GLUCAP 312* 302* 127* 221* 275*   Lipid Profile: No results for input(s): CHOL, HDL, LDLCALC, TRIG, CHOLHDL, LDLDIRECT in the last 72 hours. Thyroid Function Tests: No results for input(s): TSH, T4TOTAL, FREET4, T3FREE, THYROIDAB in the last 72 hours. Anemia Panel: No results for input(s): VITAMINB12, FOLATE, FERRITIN, TIBC, IRON, RETICCTPCT in the last 72 hours. Sepsis Labs: Recent Labs  Lab 12/30/19 1118  LATICACIDVEN 1.7    Recent Results (from the past 240 hour(s))  Culture, blood (routine x 2)     Status: None (Preliminary result)   Collection Time: 12/30/19 11:19 AM   Specimen: Left Antecubital; Blood  Result Value Ref Range Status   Specimen Description   Final    LEFT ANTECUBITAL BOTTLES DRAWN AEROBIC AND ANAEROBIC   Special Requests Blood Culture adequate volume  Final   Culture   Final    NO GROWTH < 24 HOURS Performed at [facility], [address] [place], [place] [address]     Report Status PENDING  Incomplete  Culture, blood (routine x 2)     Status: None (Preliminary result)   Collection Time: 12/30/19 11:20 AM   Specimen: Right Antecubital; Blood  Result Value Ref Range Status   Specimen Description   Final    RIGHT ANTECUBITAL BOTTLES DRAWN AEROBIC AND ANAEROBIC   Special Requests   Final    Blood Culture results may not be optimal due to an inadequate volume of blood received in culture bottles   Culture   Final    NO GROWTH < 24 HOURS Performed at [facility], [address] [place],  [address]    Report Status PENDING  Incomplete  Respiratory Panel by RT PCR (Flu A&B, Covid) - Nasopharyngeal Swab     Status: None  Collection Time: 12/30/19  4:18 PM   Specimen: Nasopharyngeal Swab  Result Value Ref Range Status   SARS Coronavirus 2 by RT PCR NEGATIVE NEGATIVE Final    Comment: (NOTE) SARS-CoV-2 target nucleic acids are NOT DETECTED.  The SARS-CoV-2 RNA is generally detectable in upper respiratoy specimens during the acute phase of infection. The lowest concentration of SARS-CoV-2 viral copies this assay can detect is 131 copies/mL. A negative result does not preclude SARS-Cov-2 infection and should not be used as the sole basis for treatment or other patient management decisions. A negative result may occur with  improper specimen collection/handling, submission of specimen other than nasopharyngeal swab, presence of viral mutation(s) within the areas targeted by this assay, and inadequate number of viral copies (<131 copies/mL). A negative result must be combined with clinical observations, patient history, and epidemiological information. The expected result is Negative.  Fact Sheet for Patients:  PinkCheek.be  Fact Sheet for Healthcare Providers:  GravelBags.it  This test is no t yet approved or cleared by the [place] FDA and  has been authorized for detection and/or  diagnosis of SARS-CoV-2 by FDA under an Emergency Use Authorization (EUA). This EUA will remain  in effect (meaning this test can be used) for the duration of the COVID-19 declaration under Section 564(b)(1) of the Act, 21 U.S.C. section 360bbb-3(b)(1), unless the authorization is terminated or revoked sooner.     Influenza A by PCR NEGATIVE NEGATIVE Final   Influenza B by PCR NEGATIVE NEGATIVE Final    Comment: (NOTE) The Xpert Xpress SARS-CoV-2/FLU/RSV assay is intended as an aid in  the diagnosis of influenza from Nasopharyngeal swab specimens and  should not be used as a sole basis for treatment. Nasal washings and  aspirates are unacceptable for Xpert Xpress SARS-CoV-2/FLU/RSV  testing.  Fact Sheet for Patients: PinkCheek.be  Fact Sheet for Healthcare Providers: GravelBags.it  This test is not yet approved or cleared by the [place] FDA and  has been authorized for detection and/or diagnosis of SARS-CoV-2 by  FDA under an Emergency Use Authorization (EUA). This EUA will remain  in effect (meaning this test can be used) for the duration of the  Covid-19 declaration under Section 564(b)(1) of the Act, 21  U.S.C. section 360bbb-3(b)(1), unless the authorization is  terminated or revoked. Performed at [facility], [address] [place],  [address]          Radiology Studies: CT Chest W Contrast  Result Date: 12/30/2019 CLINICAL DATA:  Large left pleural effusion on chest x-ray. EXAM: CT CHEST WITH CONTRAST TECHNIQUE: Multidetector CT imaging of the chest was performed during intravenous contrast administration. CONTRAST:  80mL OMNIPAQUE IOHEXOL 300 MG/ML  SOLN COMPARISON:  Chest x-ray earlier today. FINDINGS: Cardiovascular: Heart is enlarged. Small to moderate pericardial effusion. Atherosclerotic calcification is noted in the wall of the thoracic aorta. Mediastinum/Nodes: Supraclavicular, thoracic  inlet, mediastinal, left internal mammary and bilateral hilar lymphadenopathy evident. Index precarinal lymph node measures 2.5 cm short axis on image 44/series 3. Subcarinal lymph node measures 2.0 cm short axis on 63/3. 1.6 cm short axis right hilar node visible on 57/3 1.6 cm soft tissue nodule in the anterior left hilum is probably a lymph node (image 50/3). Tiny hiatal hernia. The esophagus has normal imaging features. No right axillary lymphadenopathy. 13 mm short axis left subpectoral node is visible on 28/3 with mild left axillary lymphadenopathy evident. Lungs/Pleura: Large left pleural effusion has anterior loculated component. Some aerated lung noted left upper lobe with  otherwise near complete collapse of the left upper and lower lobes. Multiple small enhancing pleural nodules are seen in the posterior left hemithorax (paraspinal on 70/3, posterior on 107/3, and posterior on 113/3. Upper Abdomen: 2.4 cm left adrenal nodule cannot be definitively characterized. No definite focal lesion within the visualized liver. Musculoskeletal: No worrisome lytic or sclerotic osseous abnormality. No obvious left breast mass although the entire left breast has not been included in the field of view. IMPRESSION: 1. Large left pleural effusion has anterior loculated component and near complete collapse of the left upper and lower lobes. 2. Supraclavicular, mediastinal, bilateral hilar, and left subpectoral/axillary lymphadenopathy consistent with metastatic disease. No primary lesion evident although left lung cancer or left breast cancer (left breast incompletely visualized on this study) would be distinct considerations. 3. 2.4 cm left adrenal nodule cannot be definitively characterized. Metastatic disease a concern. 4. Small to moderate pericardial effusion. 5. Aortic Atherosclerosis (ICD10-I70.0). Electronically Signed   By: Misty Stanley M.D.   On: 12/30/2019 14:29   CT ABDOMEN PELVIS W CONTRAST  Result Date:  12/31/2019 CLINICAL DATA:  Cancer of unknown primary. EXAM: CT ABDOMEN AND PELVIS WITH CONTRAST TECHNIQUE: Multidetector CT imaging of the abdomen and pelvis was performed using the standard protocol following bolus administration of intravenous contrast. CONTRAST:  63mL OMNIPAQUE IOHEXOL 300 MG/ML  SOLN COMPARISON:  December 30, 2019 FINDINGS: Lower chest: Moderate pericardial effusion. Partial visualization of heterogeneously enhancing LEFT lower lung opacities as well as a LEFT pleural effusion. 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 Trace RIGHT pleural effusion. Mild interlobular septal thickening likely reflecting underlying pulmonary edema. Hepatobiliary: Focal fatty deposition adjacent to the falciform ligament. Excreted contrast within the gallbladder. No intrahepatic or extrahepatic biliary ductal dilation. Portal veins are patent. Pancreas: Unremarkable. No pancreatic ductal dilatation or surrounding inflammatory changes. Spleen: Normal in size without focal abnormality. Adrenals/Urinary Tract: There is a 2.4 cm LEFT adrenal nodule. RIGHT adrenal gland is unremarkable. No hydronephrosis. Kidneys enhance symmetrically. Punctate nonobstructive RIGHT-sided nephrolithiasis. Bladder is unremarkable. Stomach/Bowel: Metallic clips within the pelvis the level of the rectum and LEFT pelvic sidewall. Small hiatal hernia. No focal or diffuse bowel wall thickening. Appendix is normal. No evidence of bowel obstruction. Moderate colonic stool burden. Vascular/Lymphatic: There is a 11 mm likely lymph node posterior to the LEFT diaphragmatic crus (series 2, image 10). There is a prominent LEFT perinephric lymph node which measures 8 mm in the short axis (series 2, image 25). Moderate atherosclerotic calcifications throughout the aorta. Reproductive: Status post hysterectomy. No adnexal masses. Other: Trace free fluid. Musculoskeletal:  Osteopenia. Multilevel degenerative changes of the thoracolumbar spine. A LEFT-sided pars defect at L3-4. Degenerative changes of the RIGHT greater than LEFT hip. IMPRESSION: 1. No evidence of primary intra-abdominal or intrapelvic malignancy. Partial visualization of heterogeneously enhancing LEFT lower lung opacities as well as a LEFT pleural effusion. 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 Findings may reflect underlying primary pulmonary malignancy versus metastatic disease. 2. There is a 2.4 cm LEFT adrenal nodule. Findings are concerning for metastatic disease. This could be further evaluated with dedicated adrenal protocol CT or MRI if clinically indicated. 3. Prominent lymph node posterior to the LEFT diaphragmatic crus and prominent LEFT perinephric lymph node which are nonspecific but likely reflect nodal metastatic disease. 4. Moderate pericardial effusion. 5. Small RIGHT pleural effusion and likely pulmonary edema. Aortic Atherosclerosis (ICD10-I70.0). Electronically Signed   By: Colletta Maryland  Peacock MD   On: 12/31/2019 13:54   DG Chest Portable 1 View  Result Date: 12/30/2019 CLINICAL DATA:  Post thoracentesis on LEFT EXAM: PORTABLE CHEST 1 VIEW COMPARISON:  Portable exam 1738 hours compared to 10/11 hours FINDINGS: Enlargement of cardiac silhouette with vascular congestion. Atherosclerotic calcification aorta. Peribronchial thickening. Scattered interstitial infiltrates, question pulmonary edema, accentuated by expiratory technique. Significant decrease in LEFT pleural effusion post thoracentesis, with residual atelectasis at LEFT base. No pneumothorax. IMPRESSION: No pneumothorax following LEFT thoracentesis. Electronically Signed   By: Lavonia Dana M.D.   On: 12/30/2019 17:46   DG Chest Portable 1 View  Result Date: 12/30/2019 CLINICAL DATA:  74 year old female with shortness of breath EXAM: PORTABLE CHEST 1 VIEW  COMPARISON:  None. FINDINGS: Opacity of the left chest obscures the left hemidiaphragm and the left heart border with meniscus at the top of the opacity. No pneumothorax. Coarsened interstitial markings. No confluent airspace disease within the right lung. IMPRESSION: Large left pleural effusion with associated atelectasis/consolidation. Electronically Signed   By: Corrie Mckusick D.O.   On: 12/30/2019 10:21   US THORACENTESIS ASP PLEURAL SPACE W/IMG GUIDE  Result Date: 12/30/2019 INDICATION: LEFT pleural effusion EXAM: ULTRASOUND GUIDED DIAGNOSTIC AND THERAPEUTIC LEFT THORACENTESIS MEDICATIONS: None. COMPLICATIONS: None immediate. PROCEDURE: An ultrasound guided thoracentesis was thoroughly discussed with the patient and questions answered. The benefits, risks, alternatives and complications were also discussed. The patient understands and wishes to proceed with the procedure. Written consent was obtained. Ultrasound was performed to localize and mark an adequate pocket of fluid in the LEFT chest. The area was then prepped and draped in the normal sterile fashion. 1% Lidocaine was used for local anesthesia. Under ultrasound guidance a 8 French thoracentesis catheter was introduced. Thoracentesis was performed. The catheter was removed and a dressing applied. FINDINGS: A total of approximately 1.08 L of cloudy yellow fluid was removed. Samples were sent to the laboratory as requested by the clinical team. IMPRESSION: Successful ultrasound guided LEFT thoracentesis yielding 1.08 L of pleural fluid. Electronically Signed   By: Lavonia Dana M.D.   On: 12/30/2019 17:42        Scheduled Meds: . amLODipine  5 mg Oral Daily  . furosemide  40 mg Intravenous Once  . heparin  5,000 Units Subcutaneous Q8H  . insulin aspart  0-15 Units Subcutaneous TID WC  . insulin aspart  0-5 Units Subcutaneous QHS  . ipratropium-albuterol  3 mL Nebulization Q6H  . lisinopril  2.5 mg Oral Daily   Continuous Infusions:    LOS: 0 days    Time spent: 83mins    Kathie Dike, MD [facility]   If 7PM-7AM, please contact night-coverage [email or web address]  12/31/2019, 7:54 PM

## 2020-01-01 ENCOUNTER — Inpatient Hospital Stay (HOSPITAL_COMMUNITY): Payer: Medicare Other

## 2020-01-01 DIAGNOSIS — I313 Pericardial effusion (noninflammatory): Secondary | ICD-10-CM

## 2020-01-01 LAB — GLUCOSE, CAPILLARY
Glucose-Capillary: 138 mg/dL — ABNORMAL HIGH (ref 70–99)
Glucose-Capillary: 242 mg/dL — ABNORMAL HIGH (ref 70–99)
Glucose-Capillary: 250 mg/dL — ABNORMAL HIGH (ref 70–99)
Glucose-Capillary: 184 mg/dL — ABNORMAL HIGH (ref 70–99)

## 2020-01-01 LAB — BASIC METABOLIC PANEL
Anion gap: 9 (ref 5–15)
BUN: 19 mg/dL (ref 8–23)
CO2: 27 mmol/L (ref 22–32)
Calcium: 9 mg/dL (ref 8.9–10.3)
Chloride: 99 mmol/L (ref 98–111)
Creatinine, Ser: 0.78 mg/dL (ref 0.44–1.00)
GFR, Estimated: >60 mL/min (ref >60)
Glucose, Bld: 153 mg/dL — ABNORMAL HIGH (ref 70–99)
Potassium: 3.5 mmol/L (ref 3.5–5.1)
Sodium: 135 mmol/L (ref 135–145)

## 2020-01-01 LAB — BRAIN NATRIURETIC PEPTIDE: B Natriuretic Peptide: 55 pg/mL (ref 0.0–100.0)

## 2020-01-01 LAB — ECHOCARDIOGRAM COMPLETE
Area-P 1/2: 12.04 cm2
Height: 64 in
S' Lateral: 2.27 cm
Weight: 2560.86 oz

## 2020-01-01 MED ORDER — POTASSIUM CHLORIDE CRYS ER 20 MEQ PO TBCR
40.0000 meq | EXTENDED_RELEASE_TABLET | Freq: Once | ORAL | Status: AC
  Administered 2020-01-01: 40 meq via ORAL
  Filled 2020-01-01: qty 2

## 2020-01-01 NOTE — Progress Notes (Signed)
PROGRESS NOTE    Sara Ferguson  RFF:[id number] DOB: [birth year] DOA: 12/30/2019 PCP: System, Provider Not In    Brief Narrative:  74 year old female with a history of hypertension, diabetes, presents to the emergency room with progressive shortness of breath.  She was found to have large left-sided pleural effusion as well as pericardial effusion and was admitted for further treatment.   Assessment & Plan:   Active Problems:   Essential hypertension   Pleural effusion, malignant   Pleural effusion   Pleural effusion, left   Type 2 diabetes mellitus without complication (HCC)   Acute respiratory failure with hypoxia (HCC)   Pericardial effusion   Large left pleural effusion -Concern for underlying malignancy -Status post thoracentesis on 11/12 -Cytology in process -Appears to be exudative on fluid analysis  Acute respiratory failure with hypoxia -Likely related to pleural effusion -She is status post thoracentesis -Still requiring supplemental oxygen at 2 L -She is still dyspneic on exertion as well as with conversation -Continue to wean off oxygen as tolerated  Pericardial effusion with bilateral pleural effusions -She continues to show some evidence of volume overload with peripheral edema -May also have concurrent CHF -BNP 55 -Echocardiogram shows normal ejection fraction -Continue on IV Lasix -Recheck chest x-ray in a.m.  Type 2 diabetes -A1c 10.7 -Currently on sliding scale insulin -Continue to monitor  Hypertension -Currently on amlodipine and lisinopril -Blood pressure currently stable   DVT prophylaxis: heparin injection 5,000 Units Start: 12/31/19 1400 SCDs Start: 12/31/19 3570  Code Status: Full code Family Communication: Discussed with patient.  She did not want me to contact any family Disposition Plan: Status is: Inpatient  Remains inpatient appropriate because:Inpatient level of care appropriate due to severity of illness   Dispo: The  patient is from: Home              Anticipated d/c is to: Home              Anticipated d/c date is: 2 days              Patient currently is not medically stable to d/c.         Consultants:     Procedures:   11/12 ultrasound-guided thoracentesis with removal of 1.08 L of fluid  Antimicrobials:      Subjective: Continues to feel short of breath.  Does not feel that respiratory status is back to baseline.  Still becomes short of breath in conversation.  Objective: Vitals:   01/01/20 0822 01/01/20 0858 01/01/20 1331 01/01/20 1430  BP: (!) 151/73   (!) 143/64  Pulse: (!) 111   89  Resp:    20  Temp:      TempSrc:      SpO2: 99% 95% 98% 97%  Weight:      Height:        Intake/Output Summary (Last 24 hours) at 01/01/2020 1626 Last data filed at 01/01/2020 1442 Gross per 24 hour  Intake 720 ml  Output --  Net 720 ml   Filed Weights   12/30/19 0956 12/30/19 2320  Weight: 72.6 kg 72.6 kg    Examination:  General exam: Alert, awake, oriented x 3 Respiratory system: Crackles at bases, diminished breath sounds at left base Cardiovascular system:RRR. No murmurs, rubs, gallops. Gastrointestinal system: Abdomen is nondistended, soft and nontender. No organomegaly or masses felt. Normal bowel sounds heard. Central nervous system: Alert and oriented. No focal neurological deficits. Extremities: No C/C/E, +pedal pulses Skin: No rashes, lesions or  ulcers Psychiatry: Judgement and insight appear normal. Mood & affect appropriate.      Data Reviewed: I have personally reviewed following labs and imaging studies  CBC: Recent Labs  Lab 12/30/19 1118 12/31/19 0656  WBC 6.6 7.0  NEUTROABS 5.3  --   HGB 13.7 13.3  HCT 43.1 42.3  MCV 88.5 89.8  PLT 364 956   Basic Metabolic Panel: Recent Labs  Lab 12/30/19 1118 12/31/19 0656 01/01/20 0617  NA 135 137 135  K 3.7 4.0 3.5  CL 99 102 99  CO2 24 25 27   GLUCOSE 309* 131* 153*  BUN 10 16 19   CREATININE  0.69 0.70 0.78  CALCIUM 9.4 9.3 9.0   GFR: Estimated Creatinine Clearance: 58.5 mL/min (by C-G formula based on SCr of 0.78 mg/dL). Liver Function Tests: Recent Labs  Lab 12/30/19 1118 12/30/19 1646 12/31/19 0656  AST 29  --  21  ALT 46*  --  35  ALKPHOS 143*  --  114  BILITOT 0.6  --  0.7  PROT 8.6* 8.1 7.4  ALBUMIN 3.7  --  3.1*   No results for input(s): LIPASE, AMYLASE in the last 168 hours. No results for input(s): AMMONIA in the last 168 hours. Coagulation Profile: No results for input(s): INR, PROTIME in the last 168 hours. Cardiac Enzymes: No results for input(s): CKTOTAL, CKMB, CKMBINDEX, TROPONINI in the last 168 hours. BNP (last 3 results) No results for input(s): PROBNP in the last 8760 hours. HbA1C: Recent Labs    12/30/19 1118  HGBA1C 10.7*   CBG: Recent Labs  Lab 12/31/19 1116 12/31/19 1657 12/31/19 2220 01/01/20 0737 01/01/20 1200  GLUCAP 221* 275* 143* 138* 242*   Lipid Profile: No results for input(s): CHOL, HDL, LDLCALC, TRIG, CHOLHDL, LDLDIRECT in the last 72 hours. Thyroid Function Tests: No results for input(s): TSH, T4TOTAL, FREET4, T3FREE, THYROIDAB in the last 72 hours. Anemia Panel: No results for input(s): VITAMINB12, FOLATE, FERRITIN, TIBC, IRON, RETICCTPCT in the last 72 hours. Sepsis Labs: Recent Labs  Lab 12/30/19 1118  LATICACIDVEN 1.7    Recent Results (from the past 240 hour(s))  Culture, blood (routine x 2)     Status: None (Preliminary result)   Collection Time: 12/30/19 11:19 AM   Specimen: Left Antecubital; Blood  Result Value Ref Range Status   Specimen Description   Final    LEFT ANTECUBITAL BOTTLES DRAWN AEROBIC AND ANAEROBIC   Special Requests Blood Culture adequate volume  Final   Culture   Final    NO GROWTH < 24 HOURS Performed at [facility], [address], [place], [place] [address]    Report Status PENDING  Incomplete  Culture, blood (routine x 2)     Status: None (Preliminary result)   Collection  Time: 12/30/19 11:20 AM   Specimen: Right Antecubital; Blood  Result Value Ref Range Status   Specimen Description   Final    RIGHT ANTECUBITAL BOTTLES DRAWN AEROBIC AND ANAEROBIC   Special Requests   Final    Blood Culture results may not be optimal due to an inadequate volume of blood received in culture bottles   Culture   Final    NO GROWTH < 24 HOURS Performed at [facility], [address] [place], [place] [address]    Report Status PENDING  Incomplete  Respiratory Panel by RT PCR (Flu A&B, Covid) - Nasopharyngeal Swab     Status: None   Collection Time: 12/30/19  4:18 PM   Specimen: Nasopharyngeal Swab  Result Value Ref Range Status   SARS Coronavirus 2 by RT PCR NEGATIVE NEGATIVE Final    Comment: (NOTE) SARS-CoV-2 target nucleic acids are NOT DETECTED.  The SARS-CoV-2 RNA is generally detectable in upper respiratoy specimens during the acute phase of infection. The lowest concentration of SARS-CoV-2 viral copies this assay can detect is 131 copies/mL. A negative result does not preclude SARS-Cov-2 infection and should not be used as the sole basis for treatment or other patient management decisions. A negative result may occur with  improper specimen collection/handling, submission of specimen other than nasopharyngeal swab, presence of viral mutation(s) within the areas targeted by this assay, and inadequate number of viral copies (<131 copies/mL). A negative result must be combined with clinical observations, patient history, and epidemiological information. The expected result is Negative.  Fact Sheet for Patients:  PinkCheek.be  Fact Sheet for Healthcare Providers:  GravelBags.it  This test is no t yet approved or cleared by the [place] FDA and  has been authorized for detection and/or diagnosis of SARS-CoV-2 by FDA under an Emergency Use Authorization (EUA). This EUA will remain  in effect (meaning  this test can be used) for the duration of the COVID-19 declaration under Section 564(b)(1) of the Act, 21 U.S.C. section 360bbb-3(b)(1), unless the authorization is terminated or revoked sooner.     Influenza A by PCR NEGATIVE NEGATIVE Final   Influenza B by PCR NEGATIVE NEGATIVE Final    Comment: (NOTE) The Xpert Xpress SARS-CoV-2/FLU/RSV assay is intended as an aid in  the diagnosis of influenza from Nasopharyngeal swab specimens and  should not be used as a sole basis for treatment. Nasal washings and  aspirates are unacceptable for Xpert Xpress SARS-CoV-2/FLU/RSV  testing.  Fact Sheet for Patients: PinkCheek.be  Fact Sheet for Healthcare Providers: GravelBags.it  This test is not yet approved or cleared by the [place] FDA and  has been authorized for detection and/or diagnosis of SARS-CoV-2 by  FDA under an Emergency Use Authorization (EUA). This EUA will remain  in effect (meaning this test can be used) for the duration of the  Covid-19 declaration under Section 564(b)(1) of the Act, 21  U.S.C. section 360bbb-3(b)(1), unless the authorization is  terminated or revoked. Performed at [facility], [address], [place], [place] [address]          Radiology Studies: CT ABDOMEN PELVIS W CONTRAST  Result Date: 12/31/2019 CLINICAL DATA:  Cancer of unknown primary. EXAM: CT ABDOMEN AND PELVIS WITH CONTRAST TECHNIQUE: Multidetector CT imaging of the abdomen and pelvis was performed using the standard protocol following bolus administration of intravenous contrast. CONTRAST:  68mL OMNIPAQUE IOHEXOL 300 MG/ML  SOLN COMPARISON:  December 30, 2019 FINDINGS: Lower chest: Moderate pericardial effusion. Partial visualization of heterogeneously enhancing LEFT lower lung opacities as well as a LEFT pleural effusion. There is enhancing nodularity along the pleural surface which likely reflects underlying malignant pleural  effusion. Trace RIGHT pleural effusion. Mild interlobular septal thickening likely reflecting underlying pulmonary edema. Hepatobiliary: Focal fatty deposition adjacent to the falciform ligament. Excreted contrast within the gallbladder. No intrahepatic or extrahepatic biliary ductal dilation. Portal veins are patent. Pancreas: Unremarkable. No pancreatic ductal dilatation or surrounding inflammatory changes. Spleen: Normal in size without focal abnormality. Adrenals/Urinary Tract: There is a 2.4 cm LEFT adrenal nodule. RIGHT adrenal gland is unremarkable. No hydronephrosis. Kidneys enhance symmetrically. Punctate nonobstructive RIGHT-sided nephrolithiasis. Bladder is unremarkable. Stomach/Bowel: Metallic clips within the pelvis the level of the rectum and LEFT pelvic sidewall. Small hiatal  hernia. No focal or diffuse bowel wall thickening. Appendix is normal. No evidence of bowel obstruction. Moderate colonic stool burden. Vascular/Lymphatic: There is a 11 mm likely lymph node posterior to the LEFT diaphragmatic crus (series 2, image 10). There is a prominent LEFT perinephric lymph node which measures 8 mm in the short axis (series 2, image 25). Moderate atherosclerotic calcifications throughout the aorta. Reproductive: Status post hysterectomy. No adnexal masses. Other: Trace free fluid. Musculoskeletal: Osteopenia. Multilevel degenerative changes of the thoracolumbar spine. A LEFT-sided pars defect at L3-4. Degenerative changes of the RIGHT greater than LEFT hip. IMPRESSION: 1. No evidence of primary intra-abdominal or intrapelvic malignancy. Partial visualization of heterogeneously enhancing LEFT lower lung opacities as well as a LEFT pleural effusion. 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 Findings may reflect underlying primary pulmonary malignancy versus metastatic disease. 2. There is a 2.4 cm LEFT adrenal nodule. Findings are  concerning for metastatic disease. This could be further evaluated with dedicated adrenal protocol CT or MRI if clinically indicated. 3. Prominent lymph node posterior to the LEFT diaphragmatic crus and prominent LEFT perinephric lymph node which are nonspecific but likely reflect nodal metastatic disease. 4. Moderate pericardial effusion. 5. Small RIGHT pleural effusion and likely pulmonary edema. Aortic Atherosclerosis (ICD10-I70.0). Electronically Signed   By: Valentino Saxon MD   On: 12/31/2019 13:54   DG Chest Portable 1 View  Result Date: 12/30/2019 CLINICAL DATA:  Post thoracentesis on LEFT EXAM: PORTABLE CHEST 1 VIEW COMPARISON:  Portable exam 1738 hours compared to 10/11 hours FINDINGS: Enlargement of cardiac silhouette with vascular congestion. Atherosclerotic calcification aorta. Peribronchial thickening. Scattered interstitial infiltrates, question pulmonary edema, accentuated by expiratory technique. Significant decrease in LEFT pleural effusion post thoracentesis, with residual atelectasis at LEFT base. No pneumothorax. IMPRESSION: No pneumothorax following LEFT thoracentesis. Electronically Signed   By: Lavonia Dana M.D.   On: 12/30/2019 17:46   ECHOCARDIOGRAM COMPLETE  Result Date: 01/01/2020    ECHOCARDIOGRAM REPORT   Patient Name:   Sara Ferguson Date of Exam: 01/01/2020 Medical Rec #:  [id number]      Height:       64.0 in Accession #:    [id number]     Weight:       160.1 lb Date of Birth:  1945/11/17      BSA:          1.780 m Patient Age:    34 years       BP:           151/73 mmHg Patient Gender: F              HR:           111 bpm. Exam Location:  [facility] Procedure: 2D Echo, Cardiac Doppler and Color Doppler STAT ECHO Indications:    Pericardial effusion  History:        Patient has no prior history of Echocardiogram examinations.                 Pericardial Disease, Signs/Symptoms:Shortness of Breath and                 Large pleural effusion; Risk Factors:Hypertension and  Diabetes.  Sonographer:    Dustin Flock RDCS Referring Phys: [place]  1. Left ventricular ejection fraction, by estimation, is 40 to 45%. The left ventricle has mildly decreased function. The left ventricle demonstrates global hypokinesis. There is moderate concentric left ventricular hypertrophy. Indeterminate  diastolic filling due to E-A fusion.  2. Right ventricular systolic function is normal. The right ventricular size is normal. Tricuspid regurgitation signal is inadequate for assessing PA pressure.  3. A small pericardial effusion is present. The pericardial effusion is circumferential. There is no evidence of cardiac tamponade.  4. The mitral valve is normal in structure. No evidence of mitral valve regurgitation. No evidence of mitral stenosis.  5. The aortic valve is normal in structure. Aortic valve regurgitation is not visualized. No aortic stenosis is present.  6. The inferior vena cava is dilated in size with <50% respiratory variability, suggesting right atrial pressure of 15 mmHg. FINDINGS  Left Ventricle: Left ventricular ejection fraction, by estimation, is 40 to 45%. The left ventricle has mildly decreased function. The left ventricle demonstrates global hypokinesis. The left ventricular internal cavity size was normal in size. There is  moderate concentric left ventricular hypertrophy. Indeterminate diastolic filling due to E-A fusion. Right Ventricle: The right ventricular size is normal. No increase in right ventricular wall thickness. Right ventricular systolic function is normal. Tricuspid regurgitation signal is inadequate for assessing PA pressure. Left Atrium: Left atrial size was normal in size. Right Atrium: Right atrial size was normal in size. Pericardium: A small pericardial effusion is present. The pericardial effusion is circumferential. There is no evidence of cardiac tamponade. Mitral Valve: The mitral valve is normal in structure. Mild mitral annular  calcification. No evidence of mitral valve regurgitation. No evidence of mitral valve stenosis. Tricuspid Valve: The tricuspid valve is normal in structure. Tricuspid valve regurgitation is not demonstrated. No evidence of tricuspid stenosis. Aortic Valve: The aortic valve is normal in structure. Aortic valve regurgitation is not visualized. No aortic stenosis is present. Pulmonic Valve: The pulmonic valve was normal in structure. Pulmonic valve regurgitation is not visualized. No evidence of pulmonic stenosis. Aorta: The aortic root is normal in size and structure. Venous: The inferior vena cava is dilated in size with less than 50% respiratory variability, suggesting right atrial pressure of 15 mmHg. IAS/Shunts: No atrial level shunt detected by color flow Doppler.  LEFT VENTRICLE PLAX 2D LVIDd:         3.09 cm  Diastology LVIDs:         2.27 cm  LV e' medial:    4.24 cm/s LV PW:         1.65 cm  LV E/e' medial:  23.2 LV IVS:        1.67 cm  LV e' lateral:   4.68 cm/s LVOT diam:     1.80 cm  LV E/e' lateral: 21.0 LV SV:         42 LV SV Index:   23 LVOT Area:     2.54 cm  RIGHT VENTRICLE RV Basal diam:  2.77 cm RV S prime:     13.20 cm/s TAPSE (M-mode): 2.5 cm LEFT ATRIUM             Index       RIGHT ATRIUM           Index LA diam:        3.60 cm 2.02 cm/m  RA Area:     12.30 cm LA Vol (A2C):   46.0 ml 25.85 ml/m RA Volume:   28.50 ml  16.01 ml/m LA Vol (A4C):   49.0 ml 27.53 ml/m LA Biplane Vol: 52.1 ml 29.28 ml/m  AORTIC VALVE LVOT Vmax:   86.70 cm/s LVOT Vmean:  63.300 cm/s LVOT VTI:  0.164 m  AORTA Ao Root diam: 2.70 cm MITRAL VALVE MV Area (PHT): 12.04 cm    SHUNTS MV Decel Time: 63 msec      Systemic VTI:  0.16 m MV E velocity: 98.50 cm/s   Systemic Diam: 1.80 cm MV A velocity: 147.00 cm/s MV E/A ratio:  0.67 Mihai Croitoru MD Electronically signed by Sanda Klein MD Signature Date/Time: 01/01/2020/1:41:21 PM    Final    US THORACENTESIS ASP PLEURAL SPACE W/IMG GUIDE  Result Date:  12/30/2019 INDICATION: LEFT pleural effusion EXAM: ULTRASOUND GUIDED DIAGNOSTIC AND THERAPEUTIC LEFT THORACENTESIS MEDICATIONS: None. COMPLICATIONS: None immediate. PROCEDURE: An ultrasound guided thoracentesis was thoroughly discussed with the patient and questions answered. The benefits, risks, alternatives and complications were also discussed. The patient understands and wishes to proceed with the procedure. Written consent was obtained. Ultrasound was performed to localize and mark an adequate pocket of fluid in the LEFT chest. The area was then prepped and draped in the normal sterile fashion. 1% Lidocaine was used for local anesthesia. Under ultrasound guidance a 8 French thoracentesis catheter was introduced. Thoracentesis was performed. The catheter was removed and a dressing applied. FINDINGS: A total of approximately 1.08 L of cloudy yellow fluid was removed. Samples were sent to the laboratory as requested by the clinical team. IMPRESSION: Successful ultrasound guided LEFT thoracentesis yielding 1.08 L of pleural fluid. Electronically Signed   By: Lavonia Dana M.D.   On: 12/30/2019 17:42        Scheduled Meds:  amLODipine  5 mg Oral Daily   heparin  5,000 Units Subcutaneous Q8H   insulin aspart  0-15 Units Subcutaneous TID WC   insulin aspart  0-5 Units Subcutaneous QHS   ipratropium-albuterol  3 mL Nebulization TID   lisinopril  2.5 mg Oral Daily   Continuous Infusions:   LOS: 1 day    Time spent: 62mins    Kathie Dike, MD [facility]   If 7PM-7AM, please contact night-coverage [email or web address]  01/01/2020, 4:26 PM

## 2020-01-01 NOTE — Progress Notes (Signed)
  Echocardiogram 2D Echocardiogram has been performed.  Sara Ferguson 01/01/2020, 9:02 AM

## 2020-01-02 ENCOUNTER — Inpatient Hospital Stay (HOSPITAL_COMMUNITY): Payer: Medicare Other

## 2020-01-02 ENCOUNTER — Other Ambulatory Visit: Payer: Self-pay

## 2020-01-02 DIAGNOSIS — J90 Pleural effusion, not elsewhere classified: Secondary | ICD-10-CM

## 2020-01-02 DIAGNOSIS — J9601 Acute respiratory failure with hypoxia: Secondary | ICD-10-CM

## 2020-01-02 LAB — GLUCOSE, CAPILLARY
Glucose-Capillary: 152 mg/dL — ABNORMAL HIGH (ref 70–99)
Glucose-Capillary: 284 mg/dL — ABNORMAL HIGH (ref 70–99)
Glucose-Capillary: 252 mg/dL — ABNORMAL HIGH (ref 70–99)
Glucose-Capillary: 133 mg/dL — ABNORMAL HIGH (ref 70–99)

## 2020-01-02 LAB — BASIC METABOLIC PANEL
Anion gap: 8 (ref 5–15)
BUN: 18 mg/dL (ref 8–23)
CO2: 26 mmol/L (ref 22–32)
Calcium: 8.8 mg/dL — ABNORMAL LOW (ref 8.9–10.3)
Chloride: 101 mmol/L (ref 98–111)
Creatinine, Ser: 0.81 mg/dL (ref 0.44–1.00)
GFR, Estimated: >60 mL/min (ref >60)
Glucose, Bld: 151 mg/dL — ABNORMAL HIGH (ref 70–99)
Potassium: 4 mmol/L (ref 3.5–5.1)
Sodium: 135 mmol/L (ref 135–145)

## 2020-01-02 MED ORDER — AZITHROMYCIN 250 MG PO TABS
500.0000 mg | ORAL_TABLET | Freq: Every day | ORAL | Status: DC
  Administered 2020-01-02 – 2020-01-03 (×2): 500 mg via ORAL
  Filled 2020-01-02 (×2): qty 2

## 2020-01-02 MED ORDER — METOPROLOL TARTRATE 25 MG PO TABS
25.0000 mg | ORAL_TABLET | Freq: Two times a day (BID) | ORAL | Status: DC
  Administered 2020-01-02: 25 mg via ORAL
  Filled 2020-01-02: qty 1

## 2020-01-02 MED ORDER — SODIUM CHLORIDE 0.9 % IV SOLN
1.0000 g | INTRAVENOUS | Status: DC
  Administered 2020-01-02 – 2020-01-03 (×2): 1 g via INTRAVENOUS
  Filled 2020-01-02 (×2): qty 10

## 2020-01-02 MED ORDER — DM-GUAIFENESIN ER 30-600 MG PO TB12
1.0000 | ORAL_TABLET | Freq: Two times a day (BID) | ORAL | Status: DC
  Administered 2020-01-02 – 2020-01-05 (×7): 1 via ORAL
  Filled 2020-01-02 (×7): qty 1

## 2020-01-02 MED ORDER — METOPROLOL TARTRATE 25 MG PO TABS
12.5000 mg | ORAL_TABLET | Freq: Two times a day (BID) | ORAL | Status: DC
  Administered 2020-01-02 – 2020-01-04 (×4): 12.5 mg via ORAL
  Filled 2020-01-02 (×4): qty 1

## 2020-01-02 NOTE — Progress Notes (Signed)
   01/02/20 0835  Assess: MEWS Score  Temp 99.2 F (37.3 C)  BP (!) 169/74  Pulse Rate (!) 113  Resp 18  Level of Consciousness Alert  SpO2 99 %  O2 Device Room Air  Assess: MEWS Score  MEWS Temp 0  MEWS Systolic 0  MEWS Pulse 2  MEWS RR 0  MEWS LOC 0  MEWS Score 2  MEWS Score Color Yellow  Assess: if the MEWS score is Yellow or Red  Were vital signs taken at a resting state? Yes  Focused Assessment No change from prior assessment  Early Detection of Sepsis Score *See Row Information* Low  MEWS guidelines implemented *See Row Information* No, vital signs rechecked  Treat  Pain Scale 0-10  Pain Score 0  Take Vital Signs  Increase Vital Sign Frequency  Yellow: Q 2hr X 2 then Q 4hr X 2, if remains yellow, continue Q 4hrs  Escalate  MEWS: Escalate Yellow: discuss with charge nurse/RN and consider discussing with provider and RRT  Notify: Charge Nurse/RN  Name of Charge Nurse/RN Notified Lattie Haw RN   Date Charge Nurse/RN Notified 01/02/20  Time Charge Nurse/RN Notified 0938  Notify: Provider  Provider Name/Title Memon MD   Date Provider Notified 01/02/20  Time Provider Notified [phone]  Notification Type Page  Notification Reason Change in status  Response See new orders  Date of Provider Response 01/02/20  Time of Provider Response [phone]

## 2020-01-02 NOTE — Progress Notes (Signed)
Patient in shower.  Ambulated back to bed and nasal canula placed and was 100% on 1.5 liters but sob.  Productive cough.  New IV placed.

## 2020-01-02 NOTE — Consult Note (Signed)
NAME:  Sara Ferguson, MRN:  [id number], DOB:  1945-04-30, LOS: 2 ADMISSION DATE:  12/30/2019, CONSULTATION DATE:  01/02/20  REFERRING MD: [facility] CHIEF COMPLAINT:  Sob/ L effusion ? Etiology    Brief History   19 yowbf remote smoker with 3-4 month illness with cough and progressive sob rx as bronchitis as outpt > admit with large L effusion > tap c/w exudate, L > P with cytology pending and PCCM service asked to consult pm 11/15   History of present illness    Sara Ferguson  is a 74 y.o. female, with past medical history of hypertension, diabetes mellitus (stopped taking Metformin on her own), history of hysterectomy in 1984, patient presented to ED secondary to shortness of breath, reported this is an ongoing problem for last 2 weeks (pt says worse x 2 weeks, but onset was 3-4 m PTA) , she was recently treated for bronchitis by urgent care, reports no improvement of symptoms which prompted her to come back to ED, reports she is an ex-smoker, but quit 40 years ago, but reports husband was a heavy smoker, and recently passed away due to malignancy in August/2021, she does report some cough, nonproductive, reports weakness, fatigue, she denies fever, chills, chest pain, nausea or vomiting, no focal deficits, no hemoptysis, no sick contact, she did test negative for Covid one day PTA, she is not vaccinated against Covid infection. -In ED her CT chest with contrast was significant for diffuse large pleural effusion, with surrounding lymphadenopathy, and an adrenal nodule, highly suspicious for malignancy, patient report last colonoscopy in 1984, mammogram was many years ago, reports hysterectomy in 1984, given she is dyspneic [facility] consulted to admit.   Past Medical History  HBP AODM   Significant Hospital Events     Consults:  PCCM 11/15   Procedures:     Significant Diagnostic Tests:   - Left thoracentesis 11/12   With 1 liter exudate, no glucose, wbc 1187 mostly  L, cytology pending   - Echo 11/14 . Left ventricular ejection fraction, by estimation, is 40 to 45%. The  left ventricle has mildly decreased function. The left ventricle  demonstrates global hypokinesis. There is moderate concentric left  ventricular hypertrophy. Indeterminate diastolic  filling due to E-A fusion.   LA nl size 2. Right ventricular systolic function is normal. The right ventricular  size is normal. Tricuspid regurgitation signal is inadequate for assessing  PA pressure.  3. A small pericardial effusion is present. The pericardial effusion is  circumferential. There is no evidence of cardiac tamponade.  4. The mitral valve is normal in structure. No evidence of mitral valve  regurgitation. No evidence of mitral stenosis.  5. The aortic valve is normal in structure. Aortic valve regurgitation is  not visualized. No aortic stenosis is present.  6. The inferior vena cava is dilated in size with <50% respiratory  variability, suggesting right atrial pressure of 15 mmHg   Micro Data:  BC x 2  11/12 >>> COVID PCR  11/12  Neg   Antimicrobials:  Roc 11/15 >>> Zmax  11/15 >>>  Scheduled Meds: . amLODipine  5 mg Oral Daily  . azithromycin  500 mg Oral Daily  . dextromethorphan-guaiFENesin  1 tablet Oral BID  . heparin  5,000 Units Subcutaneous Q8H  . insulin aspart  0-15 Units Subcutaneous TID WC  . insulin aspart  0-5 Units Subcutaneous QHS  . ipratropium-albuterol  3 mL Nebulization TID  . metoprolol tartrate  25 mg Oral  BID   Continuous Infusions: . cefTRIAXone (ROCEPHIN)  IV 1 g (01/02/20 1206)   PRN Meds:.acetaminophen **OR** acetaminophen, hydrALAZINE, HYDROcodone-acetaminophen, labetalol    Interim history/subjective:  Now able to lie flat which was not tru prior to tap/ no cp   Objective   Blood pressure 127/64, pulse (!) 47, temperature 98 F (36.7 C), resp. rate 20, height $RemoveBe'5\' 4"'LElFnJMPU$  (1.626 m), weight 72.6 kg, SpO2 98 %.        Intake/Output  Summary (Last 24 hours) at 01/02/2020 1617 Last data filed at 01/02/2020 1441 Gross per 24 hour  Intake 480 ml  Output --  Net 480 ml   Filed Weights   12/30/19 0956 12/30/19 2320  Weight: 72.6 kg 72.6 kg    Examination: General;  Elderly Bf nad on 1.5 lpm with sats 98%  Tmax 99.6  HENT: dentition ok  Lungs: clear x decreased L base with dullness  Cardiovascular: RRR no s3 / m  Abdomen: soft, nl excursion Extremities: warm s clubbing/ edema Neuro: alert/approp s apparent deficits     I personally reviewed images and agree with radiology impression as follows:  CXR:   01/02/20 PA and lateral Increased left lower lobe consolidation. Small left pleural effusion cannot be excluded.  Stable cardiomegaly. Resolution of previously seen diffuse interstitial infiltrates. My review:  Hard to tell how much is fluid but I supect still significcant with still somewhat of a "mass effect" / certainly not vol loss on L  Resolved Hospital Problem list     Assessment & Plan:   1) massive L effusion in pt sick x 3-4 m, exudative and lymphocytic is either malignant or late parapneumonic  Rec:  Consider retap dry then CT chest so we can really see what's going on in the LLL  Repeat cytology and glucose on the second tap, no other studies needed  ? CAP a concern radiographically but typically these late parapneuminic processes are inflammatory, not infected   >>> check esr in am   2) Acute hypoxemic  respiratory failure/ 02 dep at present rx IS/ mobilize to see if can recruit LL to reduce v/q mismatch     3) HBP on ACEi - hold until we sort out her vol status with freq taps   Labs   CBC: Recent Labs  Lab 12/30/19 1118 12/31/19 0656  WBC 6.6 7.0  NEUTROABS 5.3  --   HGB 13.7 13.3  HCT 43.1 42.3  MCV 88.5 89.8  PLT 364 892    Basic Metabolic Panel: Recent Labs  Lab 12/30/19 1118 12/31/19 0656 01/01/20 0617 01/02/20 0703  NA 135 137 135 135  K 3.7 4.0 3.5 4.0  CL  99 102 99 101  CO2 $Re'24 25 27 26  'MCa$ GLUCOSE 309* 131* 153* 151*  BUN $Re'10 16 19 18  'pSZ$ CREATININE 0.69 0.70 0.78 0.81  CALCIUM 9.4 9.3 9.0 8.8*   GFR: Estimated Creatinine Clearance: 57.7 mL/min (by C-G formula based on SCr of 0.81 mg/dL). Recent Labs  Lab 12/30/19 1118 12/31/19 0656  WBC 6.6 7.0  LATICACIDVEN 1.7  --     Liver Function Tests: Recent Labs  Lab 12/30/19 1118 12/30/19 1646 12/31/19 0656  AST 29  --  21  ALT 46*  --  35  ALKPHOS 143*  --  114  BILITOT 0.6  --  0.7  PROT 8.6* 8.1 7.4  ALBUMIN 3.7  --  3.1*   No results for input(s): LIPASE, AMYLASE in the last 168 hours. No results  for input(s): AMMONIA in the last 168 hours.  ABG    Component Value Date/Time   HCO3 25.5 12/30/2019 1118   O2SAT 49.1 12/30/2019 1118     Coagulation Profile: No results for input(s): INR, PROTIME in the last 168 hours.  Cardiac Enzymes: No results for input(s): CKTOTAL, CKMB, CKMBINDEX, TROPONINI in the last 168 hours.  HbA1C: Hgb A1c MFr Bld  Date/Time Value Ref Range Status  12/30/2019 11:18 AM 10.7 (H) 4.8 - 5.6 % Final    Comment:    (NOTE) Pre diabetes:          5.7%-6.4%  Diabetes:              >6.4%  Glycemic control for   <7.0% adults with diabetes     CBG: Recent Labs  Lab 01/01/20 1200 01/01/20 1711 01/01/20 2133 01/02/20 0807 01/02/20 1120  GLUCAP 242* 250* 184* 152* 284*       Past Medical History  She,  has a past medical history of Arthritis, Diabetes mellitus without complication ([place]), and Hypertension.   Surgical History    Past Surgical History:  Procedure Laterality Date  . ABDOMINAL HYSTERECTOMY    . ECTOPIC PREGNANCY SURGERY       Social History   reports that she has never smoked. She has never used smokeless tobacco. She reports previous alcohol use. She reports that she does not use drugs.   Family History   Her family history is not on file.   Allergies No Known Allergies   Home Medications  Prior to Admission  medications   Medication Sig Start Date End Date Taking? Authorizing Provider  albuterol (VENTOLIN HFA) 108 (90 Base) MCG/ACT inhaler Inhale into the lungs. 12/17/19  Yes [provider]  lisinopril (ZESTRIL) 2.5 MG tablet Take 2.5 mg by mouth daily. 10/23/19  Yes [provider]      Christinia Gully, MD [facility] [place] [phone]   After 7:00 pm call Elink  [phone]

## 2020-01-02 NOTE — Progress Notes (Deleted)
   01/02/20 0835  Assess: MEWS Score  Temp 99.2 F (37.3 C)  BP (!) 169/74  Pulse Rate (!) 113  Resp 18  Level of Consciousness Alert  SpO2 99 %  O2 Device Room Air  Assess: MEWS Score  MEWS Temp 0  MEWS Systolic 0  MEWS Pulse 2  MEWS RR 0  MEWS LOC 0  MEWS Score 2  MEWS Score Color Yellow  Assess: if the MEWS score is Yellow or Red  Were vital signs taken at a resting state? Yes  Focused Assessment No change from prior assessment  Early Detection of Sepsis Score *See Row Information* Low  MEWS guidelines implemented *See Row Information* No, vital signs rechecked  Treat  Pain Scale 0-10  Pain Score 0  Take Vital Signs  Increase Vital Sign Frequency  Yellow: Q 2hr X 2 then Q 4hr X 2, if remains yellow, continue Q 4hrs  Escalate  MEWS: Escalate Yellow: discuss with charge nurse/RN and consider discussing with provider and RRT  Notify: Charge Nurse/RN  Name of Charge Nurse/RN Notified Lattie Haw RN   Date Charge Nurse/RN Notified 01/02/20  Time Charge Nurse/RN Notified [id number]  Notify: Provider  Provider Name/Title Memon MD   Date Provider Notified 01/02/20  Time Provider Notified [id number]  Notification Type Page  Notification Reason Change in status  Response No new orders  Date of Provider Response 01/02/20

## 2020-01-02 NOTE — Progress Notes (Signed)
PROGRESS NOTE    Sara Ferguson  RWE:[id number] DOB: 03/26/1945 DOA: 12/30/2019 PCP: System, Provider Not In    Brief Narrative:  74 year old female with a history of hypertension, diabetes, presents to the emergency room with progressive shortness of breath.  She was found to have large left-sided pleural effusion as well as pericardial effusion and was admitted for further treatment.   Assessment & Plan:   Active Problems:   Essential hypertension   Pleural effusion, malignant   Pleural effusion   Pleural effusion, left   Type 2 diabetes mellitus without complication (HCC)   Acute respiratory failure with hypoxia (HCC)   Pericardial effusion   Large left pleural effusion -Concern for underlying malignancy -Status post thoracentesis on 11/12 -Cytology in process -Appears to be exudative on fluid analysis -Repeat chest x-ray on 11/15 showed persistent consolidation/effusion of the left lower lobe -Appreciate pulmonology assistance -We will attempt another thoracentesis -Started on antibiotics for any element of pneumonia since she is coughing  Acute respiratory failure with hypoxia -Likely related to pleural effusion -She is status post thoracentesis -Still requiring supplemental oxygen at 2 L -Continue to wean off oxygen as tolerated  Pericardial effusion with bilateral pleural effusions -She continues to show some evidence of volume overload with peripheral edema -May also have concurrent CHF -BNP 55 -Echocardiogram shows normal ejection fraction -Continue on IV Lasix   Type 2 diabetes -A1c 10.7 -Currently on sliding scale insulin -Continue to monitor  Hypertension -Currently on amlodipine -Blood pressure currently stable   DVT prophylaxis: heparin injection 5,000 Units Start: 12/31/19 1400 SCDs Start: 12/31/19 0218  Code Status: Full code Family Communication: Discussed with patient.  She did not want me to contact any family Disposition Plan: Status  is: Inpatient  Remains inpatient appropriate because:Inpatient level of care appropriate due to severity of illness   Dispo: The patient is from: Home              Anticipated d/c is to: Home              Anticipated d/c date is: 2 days              Patient currently is not medically stable to d/c.     Consultants:   Pulmonology  Procedures:   11/12 ultrasound-guided thoracentesis with removal of 1.08 L of fluid  Antimicrobials:      Subjective: Continues to have shortness of breath.  She has productive cough.  Objective: Vitals:   01/02/20 0835 01/02/20 1045 01/02/20 1400 01/02/20 1405  BP: (!) 169/74 (!) 174/69 127/64   Pulse: (!) 113 (!) 122 (!) 47   Resp: 18 18 20    Temp: 99.2 F (37.3 C) 98.3 F (36.8 C) 98 F (36.7 C)   TempSrc: Oral Oral    SpO2: 99% 100% 98% 98%  Weight:      Height:        Intake/Output Summary (Last 24 hours) at 01/02/2020 1935 Last data filed at 01/02/2020 1700 Gross per 24 hour  Intake 960 ml  Output --  Net 960 ml   Filed Weights   12/30/19 0956 12/30/19 2320  Weight: 72.6 kg 72.6 kg    Examination:  General exam: Alert, awake, oriented x 3 Respiratory system: Diminished breath sounds at left base. Respiratory effort normal. Cardiovascular system:RRR. No murmurs, rubs, gallops. Gastrointestinal system: Abdomen is nondistended, soft and nontender. No organomegaly or masses felt. Normal bowel sounds heard. Central nervous system: Alert and oriented. No focal neurological deficits. Extremities:  No C/C/E, +pedal pulses Skin: No rashes, lesions or ulcers Psychiatry: Judgement and insight appear normal. Mood & affect appropriate.    Data Reviewed: I have personally reviewed following labs and imaging studies  CBC: Recent Labs  Lab 12/30/19 1118 12/31/19 0656  WBC 6.6 7.0  NEUTROABS 5.3  --   HGB 13.7 13.3  HCT 43.1 42.3  MCV 88.5 89.8  PLT 364 923   Basic Metabolic Panel: Recent Labs  Lab 12/30/19 1118  12/31/19 0656 01/01/20 0617 01/02/20 0703  NA 135 137 135 135  K 3.7 4.0 3.5 4.0  CL 99 102 99 101  CO2 24 25 27 26   GLUCOSE 309* 131* 153* 151*  BUN 10 16 19 18   CREATININE 0.69 0.70 0.78 0.81  CALCIUM 9.4 9.3 9.0 8.8*   GFR: Estimated Creatinine Clearance: 57.7 mL/min (by C-G formula based on SCr of 0.81 mg/dL). Liver Function Tests: Recent Labs  Lab 12/30/19 1118 12/30/19 1646 12/31/19 0656  AST 29  --  21  ALT 46*  --  35  ALKPHOS 143*  --  114  BILITOT 0.6  --  0.7  PROT 8.6* 8.1 7.4  ALBUMIN 3.7  --  3.1*   No results for input(s): LIPASE, AMYLASE in the last 168 hours. No results for input(s): AMMONIA in the last 168 hours. Coagulation Profile: No results for input(s): INR, PROTIME in the last 168 hours. Cardiac Enzymes: No results for input(s): CKTOTAL, CKMB, CKMBINDEX, TROPONINI in the last 168 hours. BNP (last 3 results) No results for input(s): PROBNP in the last 8760 hours. HbA1C: No results for input(s): HGBA1C in the last 72 hours. CBG: Recent Labs  Lab 01/01/20 1711 01/01/20 2133 01/02/20 0807 01/02/20 1120 01/02/20 1641  GLUCAP 250* 184* 152* 284* 252*   Lipid Profile: No results for input(s): CHOL, HDL, LDLCALC, TRIG, CHOLHDL, LDLDIRECT in the last 72 hours. Thyroid Function Tests: No results for input(s): TSH, T4TOTAL, FREET4, T3FREE, THYROIDAB in the last 72 hours. Anemia Panel: No results for input(s): VITAMINB12, FOLATE, FERRITIN, TIBC, IRON, RETICCTPCT in the last 72 hours. Sepsis Labs: Recent Labs  Lab 12/30/19 1118  LATICACIDVEN 1.7    Recent Results (from the past 240 hour(s))  Culture, blood (routine x 2)     Status: None (Preliminary result)   Collection Time: 12/30/19 11:19 AM   Specimen: Left Antecubital; Blood  Result Value Ref Range Status   Specimen Description   Final    LEFT ANTECUBITAL BOTTLES DRAWN AEROBIC AND ANAEROBIC   Special Requests Blood Culture adequate volume  Final   Culture   Final    NO GROWTH 3  DAYS Performed at [facility], [address], [place], [place] [address]    Report Status PENDING  Incomplete  Culture, blood (routine x 2)     Status: None (Preliminary result)   Collection Time: 12/30/19 11:20 AM   Specimen: Right Antecubital; Blood  Result Value Ref Range Status   Specimen Description   Final    RIGHT ANTECUBITAL BOTTLES DRAWN AEROBIC AND ANAEROBIC   Special Requests   Final    Blood Culture results may not be optimal due to an inadequate volume of blood received in culture bottles   Culture   Final    NO GROWTH 3 DAYS Performed at [facility], [address] [place], [place] [address]    Report Status PENDING  Incomplete  Respiratory Panel by RT PCR (Flu A&B, Covid) - Nasopharyngeal Swab     Status: None  Collection Time: 12/30/19  4:18 PM   Specimen: Nasopharyngeal Swab  Result Value Ref Range Status   SARS Coronavirus 2 by RT PCR NEGATIVE NEGATIVE Final    Comment: (NOTE) SARS-CoV-2 target nucleic acids are NOT DETECTED.  The SARS-CoV-2 RNA is generally detectable in upper respiratoy specimens during the acute phase of infection. The lowest concentration of SARS-CoV-2 viral copies this assay can detect is 131 copies/mL. A negative result does not preclude SARS-Cov-2 infection and should not be used as the sole basis for treatment or other patient management decisions. A negative result may occur with  improper specimen collection/handling, submission of specimen other than nasopharyngeal swab, presence of viral mutation(s) within the areas targeted by this assay, and inadequate number of viral copies (<131 copies/mL). A negative result must be combined with clinical observations, patient history, and epidemiological information. The expected result is Negative.  Fact Sheet for Patients:  PinkCheek.be  Fact Sheet for Healthcare Providers:  GravelBags.it  This test is no t yet approved or  cleared by the [place] FDA and  has been authorized for detection and/or diagnosis of SARS-CoV-2 by FDA under an Emergency Use Authorization (EUA). This EUA will remain  in effect (meaning this test can be used) for the duration of the COVID-19 declaration under Section 564(b)(1) of the Act, 21 U.S.C. section 360bbb-3(b)(1), unless the authorization is terminated or revoked sooner.     Influenza A by PCR NEGATIVE NEGATIVE Final   Influenza B by PCR NEGATIVE NEGATIVE Final    Comment: (NOTE) The Xpert Xpress SARS-CoV-2/FLU/RSV assay is intended as an aid in  the diagnosis of influenza from Nasopharyngeal swab specimens and  should not be used as a sole basis for treatment. Nasal washings and  aspirates are unacceptable for Xpert Xpress SARS-CoV-2/FLU/RSV  testing.  Fact Sheet for Patients: PinkCheek.be  Fact Sheet for Healthcare Providers: GravelBags.it  This test is not yet approved or cleared by the [place] FDA and  has been authorized for detection and/or diagnosis of SARS-CoV-2 by  FDA under an Emergency Use Authorization (EUA). This EUA will remain  in effect (meaning this test can be used) for the duration of the  Covid-19 declaration under Section 564(b)(1) of the Act, 21  U.S.C. section 360bbb-3(b)(1), unless the authorization is  terminated or revoked. Performed at [facility], [address] [place], [place] [address]          Radiology Studies: DG Chest 2 View  Result Date: 01/02/2020 CLINICAL DATA:  Shortness of breath. Follow-up left pleural effusion. EXAM: CHEST - 2 VIEW COMPARISON:  12/30/2019 FINDINGS: Cardiomegaly remains stable. Previously seen diffuse interstitial infiltrates have resolved. Increased consolidation is seen in the left lower lobe since prior exam. Small left pleural effusion cannot be excluded. IMPRESSION: Increased left lower lobe consolidation. Small left pleural  effusion cannot be excluded. Stable cardiomegaly. Resolution of previously seen diffuse interstitial infiltrates. Electronically Signed   By: Marlaine Hind M.D.   On: 01/02/2020 11:22   ECHOCARDIOGRAM COMPLETE  Result Date: 01/01/2020    ECHOCARDIOGRAM REPORT   Patient Name:   Sara Ferguson Date of Exam: 01/01/2020 Medical Rec #:  [id number]      Height:       64.0 in Accession #:    [id number]     Weight:       160.1 lb Date of Birth:  22-May-1945      BSA:          1.780 m Patient Age:  74 years       BP:           151/73 mmHg Patient Gender: F              HR:           111 bpm. Exam Location:  [facility] Procedure: 2D Echo, Cardiac Doppler and Color Doppler STAT ECHO Indications:    Pericardial effusion  History:        Patient has no prior history of Echocardiogram examinations.                 Pericardial Disease, Signs/Symptoms:Shortness of Breath and                 Large pleural effusion; Risk Factors:Hypertension and Diabetes.  Sonographer:    Dustin Flock RDCS Referring Phys: [place]  1. Left ventricular ejection fraction, by estimation, is 40 to 45%. The left ventricle has mildly decreased function. The left ventricle demonstrates global hypokinesis. There is moderate concentric left ventricular hypertrophy. Indeterminate diastolic filling due to E-A fusion.  2. Right ventricular systolic function is normal. The right ventricular size is normal. Tricuspid regurgitation signal is inadequate for assessing PA pressure.  3. A small pericardial effusion is present. The pericardial effusion is circumferential. There is no evidence of cardiac tamponade.  4. The mitral valve is normal in structure. No evidence of mitral valve regurgitation. No evidence of mitral stenosis.  5. The aortic valve is normal in structure. Aortic valve regurgitation is not visualized. No aortic stenosis is present.  6. The inferior vena cava is dilated in size with <50% respiratory variability,  suggesting right atrial pressure of 15 mmHg. FINDINGS  Left Ventricle: Left ventricular ejection fraction, by estimation, is 40 to 45%. The left ventricle has mildly decreased function. The left ventricle demonstrates global hypokinesis. The left ventricular internal cavity size was normal in size. There is  moderate concentric left ventricular hypertrophy. Indeterminate diastolic filling due to E-A fusion. Right Ventricle: The right ventricular size is normal. No increase in right ventricular wall thickness. Right ventricular systolic function is normal. Tricuspid regurgitation signal is inadequate for assessing PA pressure. Left Atrium: Left atrial size was normal in size. Right Atrium: Right atrial size was normal in size. Pericardium: A small pericardial effusion is present. The pericardial effusion is circumferential. There is no evidence of cardiac tamponade. Mitral Valve: The mitral valve is normal in structure. Mild mitral annular calcification. No evidence of mitral valve regurgitation. No evidence of mitral valve stenosis. Tricuspid Valve: The tricuspid valve is normal in structure. Tricuspid valve regurgitation is not demonstrated. No evidence of tricuspid stenosis. Aortic Valve: The aortic valve is normal in structure. Aortic valve regurgitation is not visualized. No aortic stenosis is present. Pulmonic Valve: The pulmonic valve was normal in structure. Pulmonic valve regurgitation is not visualized. No evidence of pulmonic stenosis. Aorta: The aortic root is normal in size and structure. Venous: The inferior vena cava is dilated in size with less than 50% respiratory variability, suggesting right atrial pressure of 15 mmHg. IAS/Shunts: No atrial level shunt detected by color flow Doppler.  LEFT VENTRICLE PLAX 2D LVIDd:         3.09 cm  Diastology LVIDs:         2.27 cm  LV e' medial:    4.24 cm/s LV PW:         1.65 cm  LV E/e' medial:  23.2 LV IVS:  1.67 cm  LV e' lateral:   4.68 cm/s LVOT diam:      1.80 cm  LV E/e' lateral: 21.0 LV SV:         42 LV SV Index:   23 LVOT Area:     2.54 cm  RIGHT VENTRICLE RV Basal diam:  2.77 cm RV S prime:     13.20 cm/s TAPSE (M-mode): 2.5 cm LEFT ATRIUM             Index       RIGHT ATRIUM           Index LA diam:        3.60 cm 2.02 cm/m  RA Area:     12.30 cm LA Vol (A2C):   46.0 ml 25.85 ml/m RA Volume:   28.50 ml  16.01 ml/m LA Vol (A4C):   49.0 ml 27.53 ml/m LA Biplane Vol: 52.1 ml 29.28 ml/m  AORTIC VALVE LVOT Vmax:   86.70 cm/s LVOT Vmean:  63.300 cm/s LVOT VTI:    0.164 m  AORTA Ao Root diam: 2.70 cm MITRAL VALVE MV Area (PHT): 12.04 cm    SHUNTS MV Decel Time: 63 msec      Systemic VTI:  0.16 m MV E velocity: 98.50 cm/s   Systemic Diam: 1.80 cm MV A velocity: 147.00 cm/s MV E/A ratio:  0.67 Mihai Croitoru MD Electronically signed by Sanda Klein MD Signature Date/Time: 01/01/2020/1:41:21 PM    Final         Scheduled Meds:  amLODipine  5 mg Oral Daily   azithromycin  500 mg Oral Daily   dextromethorphan-guaiFENesin  1 tablet Oral BID   heparin  5,000 Units Subcutaneous Q8H   insulin aspart  0-15 Units Subcutaneous TID WC   insulin aspart  0-5 Units Subcutaneous QHS   ipratropium-albuterol  3 mL Nebulization TID   metoprolol tartrate  12.5 mg Oral BID   Continuous Infusions:  cefTRIAXone (ROCEPHIN)  IV 1 g (01/02/20 1206)     LOS: 2 days    Time spent: 53mins    Kathie Dike, MD [facility]   If 7PM-7AM, please contact night-coverage [email or web address]  01/02/2020, 7:35 PM

## 2020-01-03 ENCOUNTER — Inpatient Hospital Stay (HOSPITAL_COMMUNITY): Payer: Medicare Other

## 2020-01-03 ENCOUNTER — Other Ambulatory Visit: Payer: Self-pay

## 2020-01-03 LAB — BASIC METABOLIC PANEL
Anion gap: 9 (ref 5–15)
BUN: 17 mg/dL (ref 8–23)
CO2: 25 mmol/L (ref 22–32)
Calcium: 9.2 mg/dL (ref 8.9–10.3)
Chloride: 100 mmol/L (ref 98–111)
Creatinine, Ser: 0.78 mg/dL (ref 0.44–1.00)
GFR, Estimated: >60 mL/min (ref >60)
Glucose, Bld: 113 mg/dL — ABNORMAL HIGH (ref 70–99)
Potassium: 4.3 mmol/L (ref 3.5–5.1)
Sodium: 134 mmol/L — ABNORMAL LOW (ref 135–145)

## 2020-01-03 LAB — GLUCOSE, CAPILLARY
Glucose-Capillary: 97 mg/dL (ref 70–99)
Glucose-Capillary: 211 mg/dL — ABNORMAL HIGH (ref 70–99)
Glucose-Capillary: 264 mg/dL — ABNORMAL HIGH (ref 70–99)
Glucose-Capillary: 253 mg/dL — ABNORMAL HIGH (ref 70–99)

## 2020-01-03 LAB — SEDIMENTATION RATE: Sed Rate: 68 mm/hr — ABNORMAL HIGH (ref 0–22)

## 2020-01-03 LAB — PH, BODY FLUID: pH, Body Fluid: 7.5

## 2020-01-03 NOTE — Progress Notes (Signed)
Inpatient Diabetes Program Recommendations  AACE/ADA: New Consensus Statement on Inpatient Glycemic Control   Target Ranges:  Prepandial:   less than 140 mg/dL      Peak postprandial:   less than 180 mg/dL (1-2 hours)      Critically ill patients:  140 - 180 mg/dL  Results for MARONDA, CAISON (MRN [id number]) as of 01/03/2020 06:45  Ref. Range 01/02/2020 08:07 01/02/2020 11:20 01/02/2020 16:41 01/02/2020 20:44  Glucose-Capillary Latest Ref Range: 70 - 99 mg/dL 152 (H) 284 (H) 252 (H) 133 (H)   Results for SHYNIA, DALEO (MRN [id number]) as of 01/03/2020 06:45  Ref. Range 12/30/2019 11:18  Hemoglobin A1C Latest Ref Range: 4.8 - 5.6 % 10.7 (H)   Review of Glycemic Control  Diabetes history:DM2 Outpatient Diabetes medications: None; use to take Metformin in the past Current orders for Inpatient glycemic control: Novolog 0-15 units TID with meals, Novolog 0-5 units QHS  Inpatient Diabetes Program Recommendations:    Insulin: Please consider ordering Novolog 3 units TID with meals for meal coverage if patient eats at least 50% of meals.  HbgA1C:  A1C 10.7% on 12/30/19 indicating an average glucose of 260 mg/dl over the past 2-3 months.  Thanks, Barnie Alderman, RN, MSN, CDE Diabetes Coordinator Inpatient Diabetes Program [phone] (Team Pager from 8am to 5pm)

## 2020-01-03 NOTE — Progress Notes (Addendum)
PROGRESS NOTE    Sara Ferguson  QQV:[id number] DOB: 12-31-1945 DOA: 12/30/2019 PCP: System, Provider Not In    Brief Narrative:  74 year old female with a history of hypertension, diabetes, presents to the emergency room with progressive shortness of breath.  She was found to have large left-sided pleural effusion as well as pericardial effusion and was admitted for further treatment.   Assessment & Plan:   Active Problems:   Essential hypertension   Pleural effusion, malignant   Pleural effusion   Pleural effusion, left   Type 2 diabetes mellitus without complication (HCC)   Acute respiratory failure with hypoxia (HCC)   Pericardial effusion   Large left pleural effusion, malignant -Concern for underlying malignancy -Status post thoracentesis on 11/12 -Cytology positive for non-small cell lung cancer -Repeat chest x-ray on 11/15 showed persistent consolidation/effusion of the left lower lobe -Appreciate pulmonology assistance -Repeat thoracentesis will be performed on 11/17 -Discontinue further antibiotics -After repeat thoracentesis, can consider repeat CT chest to better evaluate lung parenchyma -She will need outpatient follow-up with oncology due to new diagnosis of lung cancer -Discussed with Dr. Delton Coombes and he will set up an outpatient PET scan for further evaluation  Acute respiratory failure with hypoxia -Likely related to pleural effusion -She is status post thoracentesis -Still requiring supplemental oxygen at 2 L -Continue to wean off oxygen as tolerated -Hopeful that respiratory status should continue to improve after repeat thoracentesis  Pericardial effusion with bilateral pleural effusions -Found to have chronic systolic congestive heart failure with ejection fraction of 40 to 45% and global hypokinesis on echocardiogram -She was treated with intravenous Lasix and overall peripheral edema has improved -BNP 55 -Continue on IV Lasix -Unfortunately,  urine output has not been accurately recorded -Will likely need cardiology follow-up   Type 2 diabetes -A1c 10.7 -Currently on sliding scale insulin -Continue to monitor  Hypertension -Currently on amlodipine -Blood pressure currently stable   DVT prophylaxis: heparin injection 5,000 Units Start: 12/31/19 1400 SCDs Start: 12/31/19 0218  Code Status: Full code Family Communication: Discussed with patient.  She did not want me to contact any family Disposition Plan: Status is: Inpatient  Remains inpatient appropriate because:Inpatient level of care appropriate due to severity of illness   Dispo: The patient is from: Home              Anticipated d/c is to: Home              Anticipated d/c date is: 2 days              Patient currently is not medically stable to d/c.     Consultants:  Pulmonology  Procedures:  11/12 ultrasound-guided thoracentesis with removal of 1.08 L of fluid  Antimicrobials:     Subjective: Continues to have significant dyspnea on exertion.  Continues to have cough.  Does not feel that her breathing is back to baseline  Objective: Vitals:   01/03/20 0907 01/03/20 0910 01/03/20 1322 01/03/20 1431  BP: (!) 157/71 (!) 157/71 (!) 142/77   Pulse: (!) 101 (!) 101 88   Resp:  20 (!) 24   Temp:  99.2 F (37.3 C) 97.6 F (36.4 C)   TempSrc:  Oral Oral   SpO2:  100% 98% 93%  Weight:      Height:        Intake/Output Summary (Last 24 hours) at 01/03/2020 1826 Last data filed at 01/03/2020 0300 Gross per 24 hour  Intake 100 ml  Output --  Net  100 ml   Filed Weights   12/30/19 0956 12/30/19 2320  Weight: 72.6 kg 72.6 kg    Examination:  General exam: Alert, awake, oriented x 3 Respiratory system: Diminished breath sounds at left base.  Becomes short of breath with conversation. Cardiovascular system:RRR. No murmurs, rubs, gallops. Gastrointestinal system: Abdomen is nondistended, soft and nontender. No organomegaly or masses felt.  Normal bowel sounds heard. Central nervous system: Alert and oriented. No focal neurological deficits. Extremities: No C/C/E, +pedal pulses Skin: No rashes, lesions or ulcers Psychiatry: Judgement and insight appear normal. Mood & affect appropriate.     Data Reviewed: I have personally reviewed following labs and imaging studies  CBC: Recent Labs  Lab 12/30/19 1118 12/31/19 0656  WBC 6.6 7.0  NEUTROABS 5.3  --   HGB 13.7 13.3  HCT 43.1 42.3  MCV 88.5 89.8  PLT 364 185   Basic Metabolic Panel: Recent Labs  Lab 12/30/19 1118 12/31/19 0656 01/01/20 0617 01/02/20 0703 01/03/20 0602  NA 135 137 135 135 134*  K 3.7 4.0 3.5 4.0 4.3  CL 99 102 99 101 100  CO2 24 25 27 26 25   GLUCOSE 309* 131* 153* 151* 113*  BUN 10 16 19 18 17   CREATININE 0.69 0.70 0.78 0.81 0.78  CALCIUM 9.4 9.3 9.0 8.8* 9.2   GFR: Estimated Creatinine Clearance: 58.5 mL/min (by C-G formula based on SCr of 0.78 mg/dL). Liver Function Tests: Recent Labs  Lab 12/30/19 1118 12/30/19 1646 12/31/19 0656  AST 29  --  21  ALT 46*  --  35  ALKPHOS 143*  --  114  BILITOT 0.6  --  0.7  PROT 8.6* 8.1 7.4  ALBUMIN 3.7  --  3.1*   No results for input(s): LIPASE, AMYLASE in the last 168 hours. No results for input(s): AMMONIA in the last 168 hours. Coagulation Profile: No results for input(s): INR, PROTIME in the last 168 hours. Cardiac Enzymes: No results for input(s): CKTOTAL, CKMB, CKMBINDEX, TROPONINI in the last 168 hours. BNP (last 3 results) No results for input(s): PROBNP in the last 8760 hours. HbA1C: No results for input(s): HGBA1C in the last 72 hours. CBG: Recent Labs  Lab 01/02/20 1641 01/02/20 2044 01/03/20 0751 01/03/20 1119 01/03/20 1612  GLUCAP 252* 133* 97 211* 264*   Lipid Profile: No results for input(s): CHOL, HDL, LDLCALC, TRIG, CHOLHDL, LDLDIRECT in the last 72 hours. Thyroid Function Tests: No results for input(s): TSH, T4TOTAL, FREET4, T3FREE, THYROIDAB in the last 72  hours. Anemia Panel: No results for input(s): VITAMINB12, FOLATE, FERRITIN, TIBC, IRON, RETICCTPCT in the last 72 hours. Sepsis Labs: Recent Labs  Lab 12/30/19 1118  LATICACIDVEN 1.7    Recent Results (from the past 240 hour(s))  Culture, blood (routine x 2)     Status: None (Preliminary result)   Collection Time: 12/30/19 11:19 AM   Specimen: Left Antecubital; Blood  Result Value Ref Range Status   Specimen Description   Final    LEFT ANTECUBITAL BOTTLES DRAWN AEROBIC AND ANAEROBIC   Special Requests Blood Culture adequate volume  Final   Culture   Final    NO GROWTH 4 DAYS Performed at [facility], [address] [place], [place] [address]    Report Status PENDING  Incomplete  Culture, blood (routine x 2)     Status: None (Preliminary result)   Collection Time: 12/30/19 11:20 AM   Specimen: Right Antecubital; Blood  Result Value Ref Range Status   Specimen Description  Final    RIGHT ANTECUBITAL BOTTLES DRAWN AEROBIC AND ANAEROBIC   Special Requests   Final    Blood Culture results may not be optimal due to an inadequate volume of blood received in culture bottles   Culture   Final    NO GROWTH 4 DAYS Performed at [facility], [address] [place], [place] [address]    Report Status PENDING  Incomplete  Respiratory Panel by RT PCR (Flu A&B, Covid) - Nasopharyngeal Swab     Status: None   Collection Time: 12/30/19  4:18 PM   Specimen: Nasopharyngeal Swab  Result Value Ref Range Status   SARS Coronavirus 2 by RT PCR NEGATIVE NEGATIVE Final    Comment: (NOTE) SARS-CoV-2 target nucleic acids are NOT DETECTED.  The SARS-CoV-2 RNA is generally detectable in upper respiratoy specimens during the acute phase of infection. The lowest concentration of SARS-CoV-2 viral copies this assay can detect is 131 copies/mL. A negative result does not preclude SARS-Cov-2 infection and should not be used as the sole basis for treatment or other patient management decisions. A  negative result may occur with  improper specimen collection/handling, submission of specimen other than nasopharyngeal swab, presence of viral mutation(s) within the areas targeted by this assay, and inadequate number of viral copies (<131 copies/mL). A negative result must be combined with clinical observations, patient history, and epidemiological information. The expected result is Negative.  Fact Sheet for Patients:  PinkCheek.be  Fact Sheet for Healthcare Providers:  GravelBags.it  This test is no t yet approved or cleared by the [place] FDA and  has been authorized for detection and/or diagnosis of SARS-CoV-2 by FDA under an Emergency Use Authorization (EUA). This EUA will remain  in effect (meaning this test can be used) for the duration of the COVID-19 declaration under Section 564(b)(1) of the Act, 21 U.S.C. section 360bbb-3(b)(1), unless the authorization is terminated or revoked sooner.     Influenza A by PCR NEGATIVE NEGATIVE Final   Influenza B by PCR NEGATIVE NEGATIVE Final    Comment: (NOTE) The Xpert Xpress SARS-CoV-2/FLU/RSV assay is intended as an aid in  the diagnosis of influenza from Nasopharyngeal swab specimens and  should not be used as a sole basis for treatment. Nasal washings and  aspirates are unacceptable for Xpert Xpress SARS-CoV-2/FLU/RSV  testing.  Fact Sheet for Patients: PinkCheek.be  Fact Sheet for Healthcare Providers: GravelBags.it  This test is not yet approved or cleared by the [place] FDA and  has been authorized for detection and/or diagnosis of SARS-CoV-2 by  FDA under an Emergency Use Authorization (EUA). This EUA will remain  in effect (meaning this test can be used) for the duration of the  Covid-19 declaration under Section 564(b)(1) of the Act, 21  U.S.C. section 360bbb-3(b)(1), unless the authorization  is  terminated or revoked. Performed at [facility], [address] [place], [place] [address]          Radiology Studies: DG Chest 2 View  Result Date: 01/02/2020 CLINICAL DATA:  Shortness of breath. Follow-up left pleural effusion. EXAM: CHEST - 2 VIEW COMPARISON:  12/30/2019 FINDINGS: Cardiomegaly remains stable. Previously seen diffuse interstitial infiltrates have resolved. Increased consolidation is seen in the left lower lobe since prior exam. Small left pleural effusion cannot be excluded. IMPRESSION: Increased left lower lobe consolidation. Small left pleural effusion cannot be excluded. Stable cardiomegaly. Resolution of previously seen diffuse interstitial infiltrates. Electronically Signed   By: Marlaine Hind M.D.   On: 01/02/2020  11:22        Scheduled Meds:  amLODipine  5 mg Oral Daily   azithromycin  500 mg Oral Daily   dextromethorphan-guaiFENesin  1 tablet Oral BID   heparin  5,000 Units Subcutaneous Q8H   insulin aspart  0-15 Units Subcutaneous TID WC   insulin aspart  0-5 Units Subcutaneous QHS   ipratropium-albuterol  3 mL Nebulization TID   metoprolol tartrate  12.5 mg Oral BID   Continuous Infusions:  cefTRIAXone (ROCEPHIN)  IV 1 g (01/03/20 1301)     LOS: 3 days    Time spent: 41mins    Kathie Dike, MD [facility]   If 7PM-7AM, please contact night-coverage [email or web address]  01/03/2020, 6:26 PM

## 2020-01-03 NOTE — Progress Notes (Signed)
Pt resting quietly in bed, has already completed her am washup. Denies c/o pain or SOB, skin warm and dry. Discussed use of IS with patient, states understanding.

## 2020-01-04 ENCOUNTER — Inpatient Hospital Stay (HOSPITAL_COMMUNITY): Payer: Medicare Other

## 2020-01-04 LAB — GLUCOSE, CAPILLARY
Glucose-Capillary: 110 mg/dL — ABNORMAL HIGH (ref 70–99)
Glucose-Capillary: 251 mg/dL — ABNORMAL HIGH (ref 70–99)
Glucose-Capillary: 154 mg/dL — ABNORMAL HIGH (ref 70–99)
Glucose-Capillary: 201 mg/dL — ABNORMAL HIGH (ref 70–99)

## 2020-01-04 LAB — BASIC METABOLIC PANEL
Anion gap: 9 (ref 5–15)
BUN: 18 mg/dL (ref 8–23)
CO2: 25 mmol/L (ref 22–32)
Calcium: 8.7 mg/dL — ABNORMAL LOW (ref 8.9–10.3)
Chloride: 99 mmol/L (ref 98–111)
Creatinine, Ser: 0.76 mg/dL (ref 0.44–1.00)
GFR, Estimated: >60 mL/min (ref >60)
Glucose, Bld: 108 mg/dL — ABNORMAL HIGH (ref 70–99)
Potassium: 4.1 mmol/L (ref 3.5–5.1)
Sodium: 133 mmol/L — ABNORMAL LOW (ref 135–145)

## 2020-01-04 LAB — CULTURE, BLOOD (ROUTINE X 2)
Culture: NO GROWTH
Culture: NO GROWTH
Special Requests: ADEQUATE

## 2020-01-04 LAB — GLUCOSE, PLEURAL OR PERITONEAL FLUID: Glucose, Fluid: 110 mg/dL

## 2020-01-04 LAB — CYTOLOGY - NON PAP

## 2020-01-04 MED ORDER — FUROSEMIDE 20 MG PO TABS
20.0000 mg | ORAL_TABLET | Freq: Every day | ORAL | Status: DC
  Administered 2020-01-04 – 2020-01-05 (×2): 20 mg via ORAL
  Filled 2020-01-04 (×2): qty 1

## 2020-01-04 MED ORDER — IPRATROPIUM-ALBUTEROL 0.5-2.5 (3) MG/3ML IN SOLN
3.0000 mL | Freq: Two times a day (BID) | RESPIRATORY_TRACT | Status: DC
  Administered 2020-01-04 – 2020-01-05 (×2): 3 mL via RESPIRATORY_TRACT
  Filled 2020-01-04 (×2): qty 3

## 2020-01-04 MED ORDER — METOPROLOL TARTRATE 25 MG PO TABS
25.0000 mg | ORAL_TABLET | Freq: Two times a day (BID) | ORAL | Status: DC
  Administered 2020-01-04 – 2020-01-05 (×2): 25 mg via ORAL
  Filled 2020-01-04 (×2): qty 1

## 2020-01-04 MED ORDER — METOPROLOL TARTRATE 25 MG PO TABS
12.5000 mg | ORAL_TABLET | Freq: Once | ORAL | Status: AC
  Administered 2020-01-04: 12.5 mg via ORAL
  Filled 2020-01-04: qty 1

## 2020-01-04 MED ORDER — IOHEXOL 300 MG/ML  SOLN
75.0000 mL | Freq: Once | INTRAMUSCULAR | Status: AC | PRN
  Administered 2020-01-04: 75 mL via INTRAVENOUS

## 2020-01-04 NOTE — Evaluation (Signed)
Physical Therapy Evaluation Patient Details Name: Sara Ferguson MRN: [id number] DOB: 1945/04/12 Today's Date: 01/04/2020   History of Present Illness  Sara Ferguson  is a 74 y.o. female, with past medical history of hypertension, diabetes mellitus (stopped taking Metformin on her own), history of hysterectomy in 1984, patient presents to ED secondary to shortness of breath, reported this is an ongoing problem for last 2 weeks, she was recently treated for bronchitis by urgent care, reports no improvement of symptoms which prompted her to come back to ED, reports she is an ex-smoker, but 40 years ago, but reports husband was a heavy smoker, and recently passed away due to malignancy in August/2021, she does report some cough, nonproductive, reports weakness, fatigue, she denies fever, chills, chest pain, nausea or vomiting, no focal deficits, no hemoptysis, no sick contact, she did test negative for Covid yesterday, she is not vaccinated against Covid infection.-In ED her CT chest with contrast was significant for diffuse large pleural effusion, with surrounding lymphadenopathy, and an adrenal nodule, highly suspicious for malignancy, patient report last colonoscopy in 1984, mammogram was many years ago, reports hysterectomy in 1984    Clinical Impression  Patient exhibits new onset of issues with functional mobility and demonstrates gait/balance deviations leading to increase risk for falls. Patient requires frequent rest breaks during functional tasks and mobility related to medical condition and reduced activity tolerance.  Patient would benefit from use of SPC to improve balance and support during ambulation to reduce risk for falls. Decreased safety awareness with managing of O2 equipment also evident with patient requiring redirection to safety and task for safe management to reduce tripping/fall hazard. Patient will benefit from continued physical therapy in hospital and recommended venue below to  increase strength, balance, endurance for safe ADLs and gait.    Follow Up Recommendations Supervision - Intermittent    Equipment Recommendations  Cane    Recommendations for Other Services       Precautions / Restrictions Precautions Precautions: Fall Restrictions Weight Bearing Restrictions: No      Mobility  Bed Mobility Overal bed mobility: Independent                  Transfers Overall transfer level: Needs assistance Equipment used: 1 person hand held assist Transfers: Sit to/from Stand;Stand Pivot Transfers Sit to Stand: Min guard Stand pivot transfers: Min guard       General transfer comment: unsteadiness and episode of retrograde LOB when standing by EOB  Ambulation/Gait Ambulation/Gait assistance: Min guard Gait Distance (Feet): 40 Feet Assistive device: 1 person hand held assist Gait Pattern/deviations: Wide base of support     General Gait Details: deficits during turning  Stairs            Wheelchair Mobility    Modified Rankin (Stroke Patients Only)       Balance Overall balance assessment: Needs assistance Sitting-balance support: Single extremity supported Sitting balance-Leahy Scale: Good       Standing balance-Leahy Scale: Fair               High level balance activites: Turns High Level Balance Comments: balance activities for challenge to prepare for home re-entry w/ new use of O2/cannula             Pertinent Vitals/Pain Pain Assessment: No/denies pain    Home Living Family/patient expects to be discharged to:: Private residence Living Arrangements: Alone Available Help at Discharge: Family Type of Home: [place]:  One level Home Equipment: None      Prior Function Level of Independence: Independent               Hand Dominance        Extremity/Trunk Assessment   Upper Extremity Assessment Upper Extremity Assessment: Overall WFL for tasks assessed    Lower  Extremity Assessment Lower Extremity Assessment: Generalized weakness       Communication      Cognition Arousal/Alertness: Awake/alert Behavior During Therapy: WFL for tasks assessed/performed Overall Cognitive Status: Within Functional Limits for tasks assessed                                        General Comments      Exercises     Assessment/Plan    PT Assessment Patient needs continued PT services  PT Problem List Decreased strength;Decreased mobility;Decreased coordination;Decreased activity tolerance;Decreased balance;Decreased knowledge of use of DME;Decreased safety awareness       PT Treatment Interventions DME instruction;Therapeutic exercise;Gait training;Balance training;Stair training;Functional mobility training;Therapeutic activities;Patient/family education    PT Goals (Current goals can be found in the Care Plan section)  Acute Rehab PT Goals Patient Stated Goal: Be independent and get well PT Goal Formulation: With patient Time For Goal Achievement: 01/11/20 Potential to Achieve Goals: Good    Frequency Min 3X/week   Barriers to discharge Decreased caregiver support patient reports she is planning on D/C to home of neice who works from home and can provide support    Co-evaluation               AM-PAC PT "6 Clicks" Mobility  Outcome Measure Help needed turning from your back to your side while in a flat bed without using bedrails?: None Help needed moving from lying on your back to sitting on the side of a flat bed without using bedrails?: None Help needed moving to and from a bed to a chair (including a wheelchair)?: A Little Help needed standing up from a chair using your arms (e.g., wheelchair or bedside chair)?: A Little Help needed to walk in hospital room?: A Little Help needed climbing 3-5 steps with a railing? : A Little 6 Click Score: 20    End of Session   Activity Tolerance: Patient limited by  fatigue Patient left: in bed;with call bell/phone within reach Nurse Communication: Mobility status PT Visit Diagnosis: Unsteadiness on feet (R26.81);Muscle weakness (generalized) (M62.81);Difficulty in walking, not elsewhere classified (R26.2)    Time: 1342-1410 PT Time Calculation (min) (ACUTE ONLY): 28 min   Charges:   PT Evaluation $PT Eval Low Complexity: 1 Low PT Treatments $Gait Training: 8-22 mins $Therapeutic Activity: 8-22 mins       2:16 PM, 01/04/20 M. Sherlyn Lees, PT, DPT Physical Therapist- [place] Office Number: [phone]

## 2020-01-04 NOTE — Procedures (Addendum)
   US guided Left thoracentesis Left pleural effusion  850 cc strawberry color fluid  Sent for labs per MD  Pt tolerated well EBL: none  CXR: No PTX per Dr Janeece Fitting

## 2020-01-04 NOTE — Progress Notes (Signed)
0830: Pt down via stretcher to radiology department for thoracentesis. Pt states that she feels a little more SOB today than yesterday and that right now she has a slight headache. 7989: Pt back from procedure. Bandaid dressing to left lateral back dry and intact. Pt states breathing, "a lot easier since they took that fluid off" and states that she no longer has a headache. Pt sitting on side of bed, eating breakfast.

## 2020-01-04 NOTE — TOC Initial Note (Signed)
Transition of Care [facility]) - Initial/Assessment Note   Patient Details  Name: Sara Ferguson MRN: [id number] Date of Birth: January 11, 1946  [facility]) CM/SW Contact:    Sherie Don, LCSW Phone Number: 01/04/2020, 11:05 AM  Clinical Narrative: Patient is a 74 year old female who was admitted for hypertension. Patient has a history of malignant pleural effusion, type 2 diabetes mellitus, acute respiratory failure with hypoxia, and pericardial effusion. TOC notified patient will need home O2 due to new lung cancer diagnosis. CSW spoke with patient and patient agreeable to referral to [facility] for O2. CSW made O2 referral to [facility] with [facility]. TOC to follow.  Expected Discharge Plan: Home/Self Care Barriers to Discharge: Continued Medical Work up  Patient Goals and CMS Choice Patient states their goals for this hospitalization and ongoing recovery are:: Discharge home CMS Medicare.gov Compare Post Acute Care list provided to:: Patient Choice offered to / list presented to : Patient  Expected Discharge Plan and Services Expected Discharge Plan: Home/Self Care In-house Referral: Clinical Social Work Discharge Planning Services: NA Post Acute Care Choice: Durable Medical Equipment Living arrangements for the past 2 months: Single Family Home             DME Arranged: Oxygen DME Agency: [facility] Date DME Agency Contacted: 01/04/20 Time DME Agency Contacted: 52 Representative spoke with at DME Agency: [place]: NA [place] Agency: NA  Prior Living Arrangements/Services Living arrangements for the past 2 months: [place] with:: Self Patient language and need for interpreter reviewed:: Yes Do you feel safe going back to the place where you live?: Yes      Need for Family Participation in Patient Care: No (Comment) Care giver support system in place?: Yes (comment) Criminal Activity/Legal Involvement Pertinent to Current Situation/Hospitalization: No -  Comment as needed  Activities of Daily Living Home Assistive Devices/Equipment: None ADL Screening (condition at time of admission) Patient's cognitive ability adequate to safely complete daily activities?: Yes Is the patient deaf or have difficulty hearing?: No Does the patient have difficulty seeing, even when wearing glasses/contacts?: No Does the patient have difficulty concentrating, remembering, or making decisions?: No Patient able to express need for assistance with ADLs?: Yes Does the patient have difficulty dressing or bathing?: No Independently performs ADLs?: Yes (appropriate for developmental age) Does the patient have difficulty walking or climbing stairs?: Yes Weakness of Legs: Both Weakness of Arms/Hands: None  Permission Sought/Granted Permission sought to share information with : Facility Art therapist granted to share info w AGENCY: [facility]  Emotional Assessment Appearance:: Appears stated age Attitude/Demeanor/Rapport: Engaged Affect (typically observed): Accepting Orientation: : Oriented to Self, Oriented to Place, Oriented to  Time, Oriented to Situation Alcohol / Substance Use: Not Applicable Psych Involvement: No (comment)  Admission diagnosis:  Dyspnea [R06.00] Pleural effusion [J90] Lung collapse [J98.19] Collapse of left lung [J98.11] Hypertension, unspecified type [I10] Pleural effusion, left [J90] Patient Active Problem List   Diagnosis Date Noted  . Pleural effusion, left 12/31/2019  . Type 2 diabetes mellitus without complication ([place]) 99/83/3825  . Acute respiratory failure with hypoxia ([place]) 12/31/2019  . Pericardial effusion 12/31/2019  . Essential hypertension 12/30/2019  . Pleural effusion, malignant 12/30/2019  . Pleural effusion 12/30/2019   PCP:  System, Provider Not In Pharmacy:   [id number] - [place], [place] [address] [place] [address] Phone: [phone] Fax:  [phone]  Readmission Risk Interventions No flowsheet data found.

## 2020-01-04 NOTE — Progress Notes (Signed)
SATURATION QUALIFICATIONS: (This note is used to comply with regulatory documentation for home oxygen)  Patient Saturations on Room Air at Rest = 100%  Patient Saturations on Room Air while Ambulating = 97%  Patient Saturations on 2 Liters of oxygen while Ambulating = 100%  Please briefly explain why patient needs home oxygen:  Pt ambulated 100 ft without oxygen, tolerated fair, resp rate up to 32/min and pt had to make frequent stops to rest and catch her breath. Once pt back to bed and sitting, resp down to 24//min after 5 minutes of rest, SaO2 98%. Pt states length ambulated just now is approx same length she has to walk inside her home from room to room.

## 2020-01-04 NOTE — Plan of Care (Signed)
  Problem: Acute Rehab PT Goals(only PT should resolve) Goal: Patient Will Transfer Sit To/From Stand Outcome: Progressing Flowsheets (Taken 01/04/2020 1417) Patient will transfer sit to/from stand: Independently Goal: Pt Will Transfer Bed To Chair/Chair To Bed Outcome: Progressing Flowsheets (Taken 01/04/2020 1417) Pt will Transfer Bed to Chair/Chair to Bed: Independently Goal: Pt Will Perform Standing Balance Or Pre-Gait Outcome: Progressing Flowsheets (Taken 01/04/2020 1417) Pt will perform standing balance or pre-gait:  with Modified Independent  3- 5 min Goal: Pt Will Ambulate Outcome: Progressing Flowsheets (Taken 01/04/2020 1417) Pt will Ambulate:  100 feet  with modified independence   2:18 PM, 01/04/20 M. Sherlyn Lees, PT, DPT Physical Therapist- [place] Office Number: [phone]

## 2020-01-04 NOTE — Progress Notes (Signed)
PROGRESS NOTE    Sara Ferguson  BPZ:[id number] DOB: 1945-09-29 DOA: 12/30/2019 PCP: System, Provider Not In   Chief Complaint  Patient presents with  . Shortness of Breath    Brief Narrative: 74 year old female with history of hypertension, diabetes presented to the ED with hypoxia shortness of breath found to have large left-sided pleural effusion as well as pericardial effusion. 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  Repeat chest x-ray 11/15 showed persistent consolidation/effusion of the left lower lobe, and repeat thoracentesis performed 11/17 with 850 mL fluid removal.  Subjective: Seen this morning, resting comfortably, shortness with much improved after thoracentesis this morning.  Assessment & Plan:  Malignant left-sided pleural effusion massive, symptomatic, cytology positive for non-small cell carcinoma on 11/12.  Status post left-sided paracentesis 11/12 and repeat paracentesis 11/17-850 mL's turbid colored fluid removed and labs sent.  Respiratory status stable on 2 L nasal cannula, will qualify for home oxygen check ambulatory pulse ox. Discussed with Dr. Melvyn Novas from pulmonary- and plan for ct chest  to evaluate.Off antibiotics,esr 68 on 11/16.  Acute respiratory failure with hypoxia in the setting of pleural effusion, on 2 L nasal cannula check oxygen to see if she needs to be on oxygen at Home.  Pericardial effusion with bilateral pleural effusions, with chronic systolic CHF EF 40 to 24% and global hypokinesia on echocardiogram.  Diuresed with IV Lasix overall edema improved he still has mild leg edema-we will add on POI Lasix.  Diabetes mellitus hemoglobin A1c 10.7 uncontrolled.  Stable continue sliding scale insulin  Essential hypertension: Poorly controlled 140s to 130s on amlodipine 5 mg and metoprolol 12.5, will increase to 25 twice daily,  normally on lisinopril low-dose at home and has been discontinued.  Nutrition: Diet Order            Diet Heart Room service appropriate? Yes; Fluid consistency: Thin  Diet effective now                 Body mass index is 27.47 kg/m.  DVT prophylaxis: heparin injection 5,000 Units Start: 12/31/19 1400 SCDs Start: 12/31/19 2353 Code Status:   Code Status: Full Code  Family Communication: plan of care discussed with patient at bedside.  Status is: Inpatient Remains inpatient appropriate because:IV treatments appropriate due to intensity of illness or inability to take PO and Inpatient level of care appropriate due to severity of illness   Dispo: The patient is from: Home lives alone              Anticipated d/c is to: Home, planning to go to her niece house in [place] on discharge.  PT evaluation requested.              Anticipated d/c date is: 1 day              Patient currently is not medically stable to d/c.  Consultants:see note  Procedures:see note  Culture/Microbiology    Component Value Date/Time   SDES  12/30/2019 1120    RIGHT ANTECUBITAL BOTTLES DRAWN AEROBIC AND ANAEROBIC   SPECREQUEST  12/30/2019 1120    Blood Culture results may not be optimal due to an inadequate volume of blood received in culture bottles   CULT  12/30/2019 1120    NO GROWTH 5 DAYS Performed at [facility], [address] [place], [place] [address]    REPTSTATUS 01/04/2020 FINAL 12/30/2019 1120  Other culture-see note  Medications: Scheduled Meds: . amLODipine  5 mg Oral Daily  . dextromethorphan-guaiFENesin  1 tablet Oral BID  . heparin  5,000 Units Subcutaneous Q8H  . insulin aspart  0-15 Units Subcutaneous TID WC  . insulin aspart  0-5 Units Subcutaneous QHS  . ipratropium-albuterol  3 mL Nebulization BID  . metoprolol tartrate  12.5 mg Oral BID   Continuous Infusions:  Antimicrobials: Anti-infectives (From admission, onward)   Start     Dose/Rate Route Frequency  Ordered Stop   01/02/20 1300  cefTRIAXone (ROCEPHIN) 1 g in sodium chloride 0.9 % 100 mL IVPB  Status:  Discontinued        1 g 200 mL/hr over 30 Minutes Intravenous Every 24 hours 01/02/20 1127 01/03/20 1910   01/02/20 1215  azithromycin (ZITHROMAX) tablet 500 mg  Status:  Discontinued        500 mg Oral Daily 01/02/20 1127 01/03/20 1910     Objective: Vitals: Today's Vitals   01/04/20 0722 01/04/20 0828 01/04/20 0841 01/04/20 1136  BP:  (!) 183/61 (!) 163/57 (!) 165/59  Pulse:  91 89 (!) 101  Resp:  20 20   Temp:  (!) 97.5 F (36.4 C) 97.6 F (36.4 C)   TempSrc:  Oral Oral   SpO2: 95% 100% 100%   Weight:      Height:      PainSc:  0-No pain     No intake or output data in the 24 hours ending 01/04/20 1321 Filed Weights   12/30/19 0956 12/30/19 2320  Weight: 72.6 kg 72.6 kg   Weight change:   Intake/Output from previous day: No intake/output data recorded. Intake/Output this shift: No intake/output data recorded.  Examination: General exam: AAO ,NAD, weak appearing. HEENT:Oral mucosa moist, Ear/Nose WNL grossly,dentition normal. Respiratory system: bilaterally clear,no wheezing or crackles,no use of accessory muscle, non tender. Cardiovascular system: S1 & S2 +, regular, No JVD. Gastrointestinal system: Abdomen soft, NT,ND, BS+. Nervous System:Alert, awake, moving extremities and grossly nonfocal Extremities: leg edema+, distal peripheral pulses palpable.  Skin: No rashes,no icterus. MSK: Normal muscle bulk,tone, power  Data Reviewed: I have personally reviewed following labs and imaging studies CBC: Recent Labs  Lab 12/30/19 1118 12/31/19 0656  WBC 6.6 7.0  NEUTROABS 5.3  --   HGB 13.7 13.3  HCT 43.1 42.3  MCV 88.5 89.8  PLT 364 885   Basic Metabolic Panel: Recent Labs  Lab 12/31/19 0656 01/01/20 0617 01/02/20 0703 01/03/20 0602 01/04/20 0549  NA 137 135 135 134* 133*  K 4.0 3.5 4.0 4.3 4.1  CL 102 99 101 100 99  CO2 _0 GLUCOSE  131* 153* 151* 113* 108*  BUN _1 CREATININE 0.70 0.78 0.81 0.78 0.76  CALCIUM 9.3 9.0 8.8* 9.2 8.7*   GFR: Estimated Creatinine Clearance: 58.5 mL/min (by C-G formula based on SCr of 0.76 mg/dL). Liver Function Tests: Recent Labs  Lab 12/30/19 1118 12/30/19 1646 12/31/19 0656  AST 29  --  21  ALT 46*  --  35  ALKPHOS 143*  --  114  BILITOT 0.6  --  0.7  PROT 8.6* 8.1 7.4  ALBUMIN 3.7  --  3.1*   No results for input(s): LIPASE, AMYLASE in the last 168 hours. No results for input(s): AMMONIA in the last 168 hours. Coagulation Profile: No results for input(s): INR, PROTIME in the last 168 hours. Cardiac Enzymes: No results for input(s): CKTOTAL, CKMB, CKMBINDEX,  TROPONINI in the last 168 hours. BNP (last 3 results) No results for input(s): PROBNP in the last 8760 hours. HbA1C: No results for input(s): HGBA1C in the last 72 hours. CBG: Recent Labs  Lab 01/03/20 1119 01/03/20 1612 01/03/20 2011 01/04/20 0740 01/04/20 1118  GLUCAP 211* 264* 253* 110* 251*   Lipid Profile: No results for input(s): CHOL, HDL, LDLCALC, TRIG, CHOLHDL, LDLDIRECT in the last 72 hours. Thyroid Function Tests: No results for input(s): TSH, T4TOTAL, FREET4, T3FREE, THYROIDAB in the last 72 hours. Anemia Panel: No results for input(s): VITAMINB12, FOLATE, FERRITIN, TIBC, IRON, RETICCTPCT in the last 72 hours. Sepsis Labs: Recent Labs  Lab 12/30/19 1118  LATICACIDVEN 1.7    Recent Results (from the past 240 hour(s))  Culture, blood (routine x 2)     Status: None   Collection Time: 12/30/19 11:19 AM   Specimen: Left Antecubital; Blood  Result Value Ref Range Status   Specimen Description   Final    LEFT ANTECUBITAL BOTTLES DRAWN AEROBIC AND ANAEROBIC   Special Requests Blood Culture adequate volume  Final   Culture   Final    NO GROWTH 5 DAYS Performed at [facility], [address] [place], [place] [address]    Report Status 01/04/2020 FINAL  Final  Culture, blood  (routine x 2)     Status: None   Collection Time: 12/30/19 11:20 AM   Specimen: Right Antecubital; Blood  Result Value Ref Range Status   Specimen Description   Final    RIGHT ANTECUBITAL BOTTLES DRAWN AEROBIC AND ANAEROBIC   Special Requests   Final    Blood Culture results may not be optimal due to an inadequate volume of blood received in culture bottles   Culture   Final    NO GROWTH 5 DAYS Performed at [facility], [address] [place], [place] [address]    Report Status 01/04/2020 FINAL  Final  Respiratory Panel by RT PCR (Flu A&B, Covid) - Nasopharyngeal Swab     Status: None   Collection Time: 12/30/19  4:18 PM   Specimen: Nasopharyngeal Swab  Result Value Ref Range Status   SARS Coronavirus 2 by RT PCR NEGATIVE NEGATIVE Final    Comment: (NOTE) SARS-CoV-2 target nucleic acids are NOT DETECTED.  The SARS-CoV-2 RNA is generally detectable in upper respiratoy specimens during the acute phase of infection. The lowest concentration of SARS-CoV-2 viral copies this assay can detect is 131 copies/mL. A negative result does not preclude SARS-Cov-2 infection and should not be used as the sole basis for treatment or other patient management decisions. A negative result may occur with  improper specimen collection/handling, submission of specimen other than nasopharyngeal swab, presence of viral mutation(s) within the areas targeted by this assay, and inadequate number of viral copies (<131 copies/mL). A negative result must be combined with clinical observations, patient history, and epidemiological information. The expected result is Negative.  Fact Sheet for Patients:  PinkCheek.be  Fact Sheet for Healthcare Providers:  GravelBags.it  This test is no t yet approved or cleared by the [place] FDA and  has been authorized for detection and/or diagnosis of SARS-CoV-2 by FDA under an Emergency Use Authorization  (EUA). This EUA will remain  in effect (meaning this test can be used) for the duration of the COVID-19 declaration under Section 564(b)(1) of the Act, 21 U.S.C. section 360bbb-3(b)(1), unless the authorization is terminated or revoked sooner.     Influenza A by PCR NEGATIVE NEGATIVE Final   Influenza  B by PCR NEGATIVE NEGATIVE Final    Comment: (NOTE) The Xpert Xpress SARS-CoV-2/FLU/RSV assay is intended as an aid in  the diagnosis of influenza from Nasopharyngeal swab specimens and  should not be used as a sole basis for treatment. Nasal washings and  aspirates are unacceptable for Xpert Xpress SARS-CoV-2/FLU/RSV  testing.  Fact Sheet for Patients: PinkCheek.be  Fact Sheet for Healthcare Providers: GravelBags.it  This test is not yet approved or cleared by the [place] FDA and  has been authorized for detection and/or diagnosis of SARS-CoV-2 by  FDA under an Emergency Use Authorization (EUA). This EUA will remain  in effect (meaning this test can be used) for the duration of the  Covid-19 declaration under Section 564(b)(1) of the Act, 21  U.S.C. section 360bbb-3(b)(1), unless the authorization is  terminated or revoked. Performed at [facility], [address] [place], [place] [address]      Radiology Studies: DG Chest 1 View  Result Date: 01/04/2020 CLINICAL DATA:  Left thoracentesis, pleural effusions EXAM: CHEST  1 VIEW COMPARISON:  01/02/2020 FINDINGS: Notable reduction in size of the left pleural effusion. No significant pneumothorax. Stable enlargement of the cardiopericardial silhouette. Atherosclerotic calcification of the aortic arch. Bony demineralization. IMPRESSION: 1. Notable reduction in size of the left pleural effusion, status post thoracentesis. No significant pneumothorax. 2. Stable enlargement of the cardiopericardial silhouette. Electronically Signed   By: Van Clines M.D.   On:  01/04/2020 09:08     LOS: 4 days   Antonieta Pert, MD [facility]  01/04/2020, 1:21 PM

## 2020-01-04 NOTE — Sedation Documentation (Signed)
PT tolerated left sided thoracentesis procedure well today and 850 mL of clear amber fluid removed and labs sent for processing. PT taken to xray post procedure and read by radiologist prior to returning back to inpatient room assignment. PT denies any pain at completion of procedure and no acute distress noted.

## 2020-01-04 NOTE — Progress Notes (Signed)
Inpatient Diabetes Program Recommendations  AACE/ADA: New Consensus Statement on Inpatient Glycemic Control   Target Ranges:  Prepandial:   less than 140 mg/dL      Peak postprandial:   less than 180 mg/dL (1-2 hours)      Critically ill patients:  140 - 180 mg/dL  Results for LETZY, GULLICKSON (MRN [id number]) as of 01/04/2020 12:07  Ref. Range 01/03/2020 07:51 01/03/2020 11:19 01/03/2020 16:12 01/03/2020 20:11 01/04/2020 07:40 01/04/2020 11:18  Glucose-Capillary Latest Ref Range: 70 - 99 mg/dL 97 211 (H) 264 (H) 253 (H) 110 (H) 251 (H)   Results for ASIA, DUSENBURY (MRN [id number]) as of 01/04/2020 12:07  Ref. Range 12/30/2019 11:18  Hemoglobin A1C Latest Ref Range: 4.8 - 5.6 % 10.7 (H)   Review of Glycemic Control  Diabetes history:DM2 Outpatient Diabetes medications: None; use to take Metformin in the past Current orders for Inpatient glycemic control: Novolog 0-15 units TID with meals, Novolog 0-5 units QHS  Inpatient Diabetes Program Recommendations:    Insulin: Please consider ordering Novolog 4 units TID with meals for meal coverage if patient eats at least 50% of meals.  HbgA1C:  A1C 10.7% on 12/30/19 indicating an average glucose of 260 mg/dl over the past 2-3 months. Please prescribe DM medication at time of discharge. Patient notes that she does not tolerate Metformin and would prefer oral DM medication for outpatient DM management.  NOTE: Spoke with patient over the phone regarding diabetes and home regimen for diabetes control. Patient reports being followed by PCP and does not currently take any medications for DM. Patient states that she use to be prescribed Metformin but she stopped taking it because it made her feel sick. Patient would prefer not to resume Metformin. Patient states that she use to check glucose but she does not know where her glucometer and testing supplies are as it has been a very long time since she used them.  Discussed A1C results (10.7% on 12/30/19  ) and explained that current A1C indicates an average glucose of 260 mg/dl over the past 2-3 months. Discussed glucose and A1C goals. Discussed importance of checking CBGs and maintaining good CBG control to prevent long-term and short-term complications. Explained how hyperglycemia leads to damage within blood vessels which lead to the common complications seen with uncontrolled diabetes. Stressed to the patient the importance of improving glycemic control to prevent further complications from uncontrolled diabetes. Discussed need for outpatient DM medications and encouraged patient to take DM medications as prescribed. Patient states that she does not want to use Metformin but she is agreeable to take other oral DM medications as an outpatient. Discussed how different DM medications work and risk of hypoglycemia with some oral DM medications. Reviewed hypoglycemia along with proper treatment.  Encouraged patient to check glucose as directed, take DM medications as prescribed, follow up with PCP regarding glycemic control.  Patient verbalized understanding of information discussed and reports no further questions at this time related to diabetes. At time of discharge please provide Rx for: glucose monitoring kit (#[id number]) and DM medication(s).  Thanks, Barnie Alderman, RN, MSN, CDE Diabetes Coordinator Inpatient Diabetes Program [phone] (Team Pager from 8am to 5pm)

## 2020-01-05 ENCOUNTER — Encounter (HOSPITAL_COMMUNITY): Payer: Self-pay

## 2020-01-05 ENCOUNTER — Other Ambulatory Visit (HOSPITAL_COMMUNITY): Payer: Self-pay

## 2020-01-05 DIAGNOSIS — J910 Malignant pleural effusion: Secondary | ICD-10-CM

## 2020-01-05 DIAGNOSIS — C3490 Malignant neoplasm of unspecified part of unspecified bronchus or lung: Secondary | ICD-10-CM

## 2020-01-05 LAB — CYTOLOGY - NON PAP

## 2020-01-05 LAB — GLUCOSE, CAPILLARY: Glucose-Capillary: 79 mg/dL (ref 70–99)

## 2020-01-05 MED ORDER — AMLODIPINE BESYLATE 5 MG PO TABS: 5.0000 mg | ORAL_TABLET | Freq: Every day | ORAL | 1 refills | Status: DC

## 2020-01-05 MED ORDER — METOPROLOL TARTRATE 25 MG PO TABS: 25.0000 mg | ORAL_TABLET | Freq: Two times a day (BID) | ORAL | 1 refills | Status: DC

## 2020-01-05 MED ORDER — FUROSEMIDE 20 MG PO TABS: 20.0000 mg | ORAL_TABLET | Freq: Every day | ORAL | 0 refills | Status: DC

## 2020-01-05 MED ORDER — ALBUTEROL SULFATE HFA 108 (90 BASE) MCG/ACT IN AERS: 2.0000 | INHALATION_SPRAY | Freq: Four times a day (QID) | RESPIRATORY_TRACT | 1 refills | Status: AC | PRN

## 2020-01-05 NOTE — Progress Notes (Signed)
NAME:  Sara Ferguson, MRN:  [id number], DOB:  August 20, 1945, LOS: 5 ADMISSION DATE:  12/30/2019, CONSULTATION DATE:  01/02/20  REFERRING MD: [facility] CHIEF COMPLAINT:  Sob/ L effusion ? Etiology    Brief History   64 yowbf remote smoker with 3-4 month illness with cough and progressive sob rx as bronchitis as outpt > admit with large L effusion > tap c/w exudate, L > P with cytology pending and PCCM service asked to consult pm 11/15   History of present illness    Sara Ferguson  is a 74 y.o. female, with past medical history of hypertension, diabetes mellitus (stopped taking Metformin on her own), history of hysterectomy in 1984, patient presented to ED secondary to shortness of breath, reported this is an ongoing problem for last 2 weeks (pt says worse x 2 weeks, but onset was 3-4 m PTA) , she was recently treated for bronchitis by urgent care, reports no improvement of symptoms which prompted her to come back to ED, reports she is an ex-smoker, but quit 40 years ago, but reports husband was a heavy smoker, and recently passed away due to malignancy in August/2021, she does report some cough, nonproductive, reports weakness, fatigue, she denies fever, chills, chest pain, nausea or vomiting, no focal deficits, no hemoptysis, no sick contact, she did test negative for Covid one day PTA, she is not vaccinated against Covid infection. -In ED her CT chest with contrast was significant for diffuse large pleural effusion, with surrounding lymphadenopathy, and an adrenal nodule, highly suspicious for malignancy, patient report last colonoscopy in 1984, mammogram was many years ago, reports hysterectomy in 1984, given she is dyspneic [facility] consulted to admit.   Past Medical History  HBP AODM   Significant Hospital Events     Consults:  PCCM 11/15   Procedures:     Significant Diagnostic Tests:   - Left thoracentesis 11/12   With 1 liter exudate, no glucose, wbc 1187 mostly  L, cytology pos for  [facility] Lung ca    - Echo 11/14 . Left ventricular ejection fraction, by estimation, is 40 to 45%. The  left ventricle has mildly decreased function. The left ventricle  demonstrates global hypokinesis. There is moderate concentric left  ventricular hypertrophy. Indeterminate diastolic  filling due to E-A fusion.   LA nl size 2. Right ventricular systolic function is normal. The right ventricular  size is normal. Tricuspid regurgitation signal is inadequate for assessing  PA pressure.  3. A small pericardial effusion is present. The pericardial effusion is  circumferential. There is no evidence of cardiac tamponade.  4. The mitral valve is normal in structure. No evidence of mitral valve  regurgitation. No evidence of mitral stenosis.  5. The aortic valve is normal in structure. Aortic valve regurgitation is  not visualized. No aortic stenosis is present.  6. The inferior vena cava is dilated in size with <50% respiratory  variability, suggesting right atrial pressure of 15 mmHg  - L thoracentesis  11/17  repeat 850 cc with relief of sob   CT with contrast 11/17 1. Interval decreased volume of left pleural effusion following thoracentesis. No pneumothorax. 2. Improved aeration of the left lung with residual left infrahilar mass-like density, possibly reflecting the primary bronchogenic carcinoma. 3. Grossly stable left axillary, confluent mediastinal and hilar adenopathy consistent with metastatic disease. 4. Stable left adrenal nodule, suspicious for metastasis. 5. New small dependent right pleural effusion. Stable pericardial effusion.   Micro Data:  BC x 2  11/12 >>> COVID PCR  11/12  Neg   Antimicrobials:  Roc 11/15 >  11/16 Zmax  11/15 > 11/16   Scheduled Meds: . amLODipine  5 mg Oral Daily  . dextromethorphan-guaiFENesin  1 tablet Oral BID  . furosemide  20 mg Oral Daily  . heparin  5,000 Units Subcutaneous Q8H  . insulin aspart  0-15 Units  Subcutaneous TID WC  . insulin aspart  0-5 Units Subcutaneous QHS  . ipratropium-albuterol  3 mL Nebulization BID  . metoprolol tartrate  25 mg Oral BID   Continuous Infusions:  PRN Meds:.acetaminophen **OR** acetaminophen, hydrALAZINE, HYDROcodone-acetaminophen, labetalol    Interim history/subjective:   improved to point yesterday of walking same distance she does at home on RA with sats 97%   Objective   Blood pressure (!) 150/52, pulse 82, temperature 97.9 F (36.6 C), resp. rate 19, height 5\' 4"  (1.626 m), weight 72.6 kg, SpO2 99 %.       No intake or output data in the 24 hours ending 01/05/20 0609 Filed Weights   12/30/19 0956 12/30/19 2320  Weight: 72.6 kg 72.6 kg    Examination: Tmax 98.1  Pt alert, approp nad lying flat in bed  No jvd Neck supple Lungs with a few scattered exp > insp rhonchi bilaterally decreased bs L base  RRR no s3 or or sign murmur Abd soft / nl  excursion  Extr warm with no edema or clubbing noted Neuro  Sensorium intact         Resolved Hospital Problem list     Assessment & Plan:   1) massive L effusion in pt sick x 3-4 m, exudative and lymphocytic with malignant cytology c/w NSC lung ca, and glucose 110 which is actually a favorable sign, at least over the short run - assoc with small pericardial effusion which may turn out to be malignant as well noted   >>>> rec  Oncology eval asap , retap prn sob and place pleurex per T surgery if can't control the underlying process thru oncology rx   >>> echo may need to be repeated if clinically indicated but leave that to oncology to f/u    2) Acute hypoxemic  respiratory failure/ resolved by walking sats on RA 11/17   3) HBP on ACEi - ACE inhibitors are problematic in  pts with airway complaints because  even experienced pulmonologists can't always distinguish ace effects from copd/asthma.  By themselves they don't actually cause a problem, much like oxygen can't by itself start a fire,  but they certainly serve as a powerful catalyst or enhancer for any "fire"  or inflammatory process in the upper airway, be it caused by an ET  tube or more commonly reflux (especially in the obese or pts with known GERD or who are on biphoshonates).    In the era of ARB near equivalency until we have a better handle on the reversibility of the airway problem, it just makes sense to avoid ACEI  entirely in the short run and then decide later, having established a level of airway control using a reasonable limited regimen, whether to add back ace but even then being very careful to observe the pt for worsening airway control and number of meds used/ needed to control symptoms  >>> avoid acei if possible in this setting, esp if repeat taps needed as vol shifts occur which may result in transient renal hypoperfusion .     Labs   CBC: Recent Labs  Lab  12/30/19 1118 12/31/19 0656  WBC 6.6 7.0  NEUTROABS 5.3  --   HGB 13.7 13.3  HCT 43.1 42.3  MCV 88.5 89.8  PLT 364 371    Basic Metabolic Panel: Recent Labs  Lab 12/31/19 0656 01/01/20 0617 01/02/20 0703 01/03/20 0602 01/04/20 0549  NA 137 135 135 134* 133*  K 4.0 3.5 4.0 4.3 4.1  CL 102 99 101 100 99  CO2 25 27 26 25 25   GLUCOSE 131* 153* 151* 113* 108*  BUN 16 19 18 17 18   CREATININE 0.70 0.78 0.81 0.78 0.76  CALCIUM 9.3 9.0 8.8* 9.2 8.7*   GFR: Estimated Creatinine Clearance: 58.5 mL/min (by C-G formula based on SCr of 0.76 mg/dL). Recent Labs  Lab 12/30/19 1118 12/31/19 0656  WBC 6.6 7.0  LATICACIDVEN 1.7  --     Liver Function Tests: Recent Labs  Lab 12/30/19 1118 12/30/19 1646 12/31/19 0656  AST 29  --  21  ALT 46*  --  35  ALKPHOS 143*  --  114  BILITOT 0.6  --  0.7  PROT 8.6* 8.1 7.4  ALBUMIN 3.7  --  3.1*   No results for input(s): LIPASE, AMYLASE in the last 168 hours. No results for input(s): AMMONIA in the last 168 hours.  ABG    Component Value Date/Time   HCO3 25.5 12/30/2019 1118   O2SAT 49.1  12/30/2019 1118     Coagulation Profile: No results for input(s): INR, PROTIME in the last 168 hours.  Cardiac Enzymes: No results for input(s): CKTOTAL, CKMB, CKMBINDEX, TROPONINI in the last 168 hours.  HbA1C: Hgb A1c MFr Bld  Date/Time Value Ref Range Status  12/30/2019 11:18 AM 10.7 (H) 4.8 - 5.6 % Final    Comment:    (NOTE) Pre diabetes:          5.7%-6.4%  Diabetes:              >6.4%  Glycemic control for   <7.0% adults with diabetes     CBG: Recent Labs  Lab 01/03/20 2011 01/04/20 0740 01/04/20 1118 01/04/20 1647 01/04/20 2105  GLUCAP 253* 110* 251* 154* 201*      Christinia Gully, MD Pulmonary and [place] [phone]   After 7:00 pm call Elink  [phone]

## 2020-01-05 NOTE — Progress Notes (Signed)
Physical Therapy Treatment Patient Details Name: Sara Ferguson MRN: [id number] DOB: [birth year] Today's Date: 01/05/2020    History of Present Illness Sara Ferguson  is a 74 y.o. female, with past medical history of hypertension, diabetes mellitus (stopped taking Metformin on her own), history of hysterectomy in 1984, patient presents to ED secondary to shortness of breath, reported this is an ongoing problem for last 2 weeks, she was recently treated for bronchitis by urgent care, reports no improvement of symptoms which prompted her to come back to ED, reports she is an ex-smoker, but 40 years ago, but reports husband was a heavy smoker, and recently passed away due to malignancy in August/2021, she does report some cough, nonproductive, reports weakness, fatigue, she denies fever, chills, chest pain, nausea or vomiting, no focal deficits, no hemoptysis, no sick contact, she did test negative for Covid yesterday, she is not vaccinated against Covid infection.-In ED her CT chest with contrast was significant for diffuse large pleural effusion, with surrounding lymphadenopathy, and an adrenal nodule, highly suspicious for malignancy, patient report last colonoscopy in 1984, mammogram was many years ago, reports hysterectomy in 1984    PT Comments    Demonstrates improved functional performance today and maintaining O2% of 92-94% on room air with exertion (transfers/ambulation) and able to demonstrate good safety awareness with mobility without use of AD and performing short distance ambulation on level surfaces with modified independence w/ short standing rest periods ad lib.    Follow Up Recommendations  No PT follow up     Equipment Recommendations  None recommended by PT    Recommendations for Other Services       Precautions / Restrictions Precautions Precautions: None    Mobility  Bed Mobility Overal bed mobility: Independent                Transfers Overall transfer  level: Independent Equipment used: None Transfers: Sit to/from [employer] to Stand: Modified independent (Device/Increase time) Stand pivot transfers: Modified independent (Device/Increase time)          Ambulation/Gait Ambulation/Gait assistance: Modified independent (Device/Increase time) Gait Distance (Feet): 120 Feet Assistive device: None Gait Pattern/deviations: Wide base of support Gait velocity: decreased, standing rest periods ad lib       Stairs             Wheelchair Mobility    Modified Rankin (Stroke Patients Only)       Balance                                            Cognition Arousal/Alertness: Awake/alert Behavior During Therapy: WFL for tasks assessed/performed Overall Cognitive Status: Within Functional Limits for tasks assessed                                        Exercises      General Comments        Pertinent Vitals/Pain Pain Assessment: No/denies pain    Home Living Family/patient expects to be discharged to:: Private residence Living Arrangements: Other relatives Available Help at Discharge: Family Type of Home: Apartment Home Access: Level entry   Home Layout: One level Home Equipment: None      Prior Function Level of Independence: Independent  PT Goals (current goals can now be found in the care plan section) Acute Rehab PT Goals Patient Stated Goal: Be independent and get well PT Goal Formulation: With patient Time For Goal Achievement: 01/05/20 Potential to Achieve Goals: Good    Frequency           PT Plan      Co-evaluation              AM-PAC PT "6 Clicks" Mobility   Outcome Measure  Help needed turning from your back to your side while in a flat bed without using bedrails?: None Help needed moving from lying on your back to sitting on the side of a flat bed without using bedrails?: None Help needed moving to and from  a bed to a chair (including a wheelchair)?: None Help needed standing up from a chair using your arms (e.g., wheelchair or bedside chair)?: None Help needed to walk in hospital room?: None Help needed climbing 3-5 steps with a railing? : None 6 Click Score: 24    End of Session   Activity Tolerance: Patient tolerated treatment well Patient left: in bed;with call bell/phone within reach Nurse Communication: Mobility status PT Visit Diagnosis: Unsteadiness on feet (R26.81);Muscle weakness (generalized) (M62.81);Difficulty in walking, not elsewhere classified (R26.2)     Time: 0932-3557 PT Time Calculation (min) (ACUTE ONLY): 20 min  Charges:  $Gait Training: 8-22 mins                    10:59 AM, 01/05/20 M. Sherlyn Lees, PT, DPT Physical Therapist- [place] Office Number: [phone]

## 2020-01-05 NOTE — Progress Notes (Signed)
I met with this patient today prior to her hospital discharge. I introduced myself and explained my role in the patient's care. Patient reports transportation concerns moving forward as she does intend to move into her niece's house in [place]. Patient states that she wishes to keep her appts here at [facility] for now until she knows how long she will be living with her niece. I have reached out to our financial counselor and CSW to locate transportation options for the patient. I provided my contact information and encouraged the patient to call with any questions or concerns.

## 2020-01-05 NOTE — TOC Transition Note (Signed)
Transition of Care [facility]) - CM/SW Discharge Note   Patient Details  Name: Sara Ferguson MRN: [id number] Date of Birth: 1945-03-20  [facility]) CM/SW Contact:  Shade Flood, LCSW Phone Number: 01/05/2020, 10:50 AM   Clinical Narrative:     Pt stable for dc per MD. Received consult to assist with follow up oncology appointment. Reviewed pt's record and pt already has an appointment scheduled for Oncology follow up on 01/16/20 and that appointment information is on pt's AVS.   Pt does not need Home O2 for dc. Updated Barbaraann Rondo at [place].  There are no other TOC needs identified for dc.  Final next level of care: Home/Self Care Barriers to Discharge: Barriers Resolved   Patient Goals and CMS Choice Patient states their goals for this hospitalization and ongoing recovery are:: Discharge home CMS Medicare.gov Compare Post Acute Care list provided to:: Patient Choice offered to / list presented to : Patient  Discharge Placement                       Discharge Plan and Services In-house Referral: Clinical Social Work Discharge Planning Services: NA Post Acute Care Choice: Durable Medical Equipment          DME Arranged: Oxygen DME Agency: [employer] Date DME Agency Contacted: 01/04/20 Time DME Agency Contacted: 65 Representative spoke with at DME Agency: Barbaraann Rondo HH Arranged: NA [place] Agency: NA        Social Determinants of Health ([place]) Interventions     Readmission Risk Interventions No flowsheet data found.

## 2020-01-05 NOTE — Progress Notes (Signed)
PET scan ordered per Dr. Delton Coombes.

## 2020-01-05 NOTE — Plan of Care (Signed)

## 2020-01-05 NOTE — Discharge Summary (Signed)
Physician Discharge Summary  Sara Ferguson WNI:[id number] DOB: 09/27/1945 DOA: 12/30/2019  PCP: System, Provider Not In  Admit date: 12/30/2019 Discharge date: 01/05/2020  Admitted From: home Disposition:  home  Recommendations for Outpatient Follow-up:  1. Follow up with PCP in 1-2 weeks 2. Please obtain BMP/CBC in one week 3. Please follow up on the following pending results:  Home Health:No  Equipment/Devices: none  Discharge Condition: Stable Code Status:   Code Status: Full Code Diet recommendation:  Diet Order             Diet - low sodium heart healthy           Diet Heart Room service appropriate? Yes; Fluid consistency: Thin  Diet effective now                    Brief/Interim Summary:  74 year old female with history of hypertension, diabetes presented to the ED with hypoxia shortness of breath found to have large left-sided pleural effusion as well as pericardial effusion. 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  Repeat chest x-ray 11/15 showed persistent consolidation/effusion of the left lower lobe, and repeat thoracentesis performed 11/17 with 850 mL fluid removal. Patient underwent CT chest showed improved aeration of the left lung with residual left infrahilar masslike density possibly reflecting the primary bronchogenic carcinoma, grossly stable left axillary confluent mediastinal and hilar adenopathy consistent with metastatic disease, left adrenal nodule suspicious for metastasis, new small dependent right pleural effusion stable pericardial effusion. Patient is able to cough the oxygen ambulating well.  She is interested to go home TODAY. I have discussed with Dr. Melvyn Novas from pulmonary and also sent Ambulatory referral for oncology and she is scheduled to get a PET scan as outpatient.  Discharge Diagnoses:  Malignant left-sided  pleural effusion massive, symptomatic, cytology positive for non-small cell carcinoma on 11/12.  Status post left-sided paracentesis 11/12 and repeat paracentesis 11/17-850 mL's turbid colored fluid removed and labs sent.  Respiratory status stable on 2 L nasal cannula, will qualify for home oxygen check ambulatory pulse ox. Discussed with Dr. Melvyn Novas from pulmonary-and had a CT chest.  At this time she will go home today and follow-up with oncology and pulmonary as outpatient.    Acute respiratory failure with hypoxia  resolved after fluid removal.  She is doing well on room air.     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  Diuresed with IV Lasix overall edema improved he still has mild leg edema-we will added on PO Lasix.  She had a small pleural effusion on echo and it is stable by CT chest.   Diabetes mellitus hemoglobin A1c 10.7 uncontrolled.    Continue home regimen.   Essential hypertension: Poorly controlled she was started on amlodipine and metoprolol.  Blood pressure overall is stable she will continue on the current dose.    2DECHO 1. Left ventricular ejection fraction, by estimation, is 40 to 45%. The  left ventricle has mildly decreased function. The left ventricle  demonstrates global hypokinesis. There is moderate concentric left  ventricular hypertrophy. Indeterminate diastolic  filling due to E-A fusion.   2. Right ventricular systolic function is normal. The right ventricular  size is normal. Tricuspid regurgitation signal is inadequate for assessing  PA pressure.   3. A small pericardial effusion is present. The pericardial effusion  is  circumferential. There is no evidence of cardiac tamponade.   4. The mitral valve is normal in structure. No evidence of mitral valve  regurgitation. No evidence of mitral stenosis.   5. The aortic valve is normal in structure. Aortic valve  regurgitation is  not visualized. No aortic stenosis is present.   6. The inferior vena cava is dilated in size with <50% respiratory  variability, suggesting right atrial pressure of 15 mmHg. Consults:  Pulm, Oncology  Subjective: AAOX3, ON ra, NO sob, NO cp, NO NAUSEA VOMITING, AMBULATING ON ROOM AIR.  Discharge Exam: Vitals:   01/05/20 0514 01/05/20 0722  BP: (!) 150/52   Pulse: 82   Resp: 19   Temp: 97.9 F (36.6 C)   SpO2: 99% 98%   General: Pt is alert, awake, not in acute distress Cardiovascular: RRR, S1/S2 +, no rubs, no gallops Respiratory: CTA bilaterally, no wheezing, no rhonchi Abdominal: Soft, NT, ND, bowel sounds + Extremities: no edema, no cyanosis  Discharge Instructions  Discharge Instructions     Ambulatory referral to Oncology   Complete by: As directed    New lung cancer   Diet - low sodium heart healthy   Complete by: As directed    Discharge instructions   Complete by: As directed    He will need to follow-up with oncology office.  Please call the number provided if you do not hear from the in next 3 to 4 days  Please call call MD or return to ER for similar or worsening recurring problem that brought you to hospital or if any fever,nausea/vomiting,abdominal pain, uncontrolled pain, chest pain,  shortness of breath or any other alarming symptoms.  Please follow-up your doctor as instructed in a week time and call the office for appointment.  Please avoid alcohol, smoking, or any other illicit substance and maintain healthy habits including taking your regular medications as prescribed.  You were cared for by a hospitalist during your hospital stay. If you have any questions about your discharge medications or the care you received while you were in the hospital after you are discharged, you can call the unit and ask to speak with the hospitalist on call if the hospitalist that took care of you is not available.  Once you are discharged, your  primary care physician will handle any further medical issues. Please note that NO REFILLS for any discharge medications will be authorized once you are discharged, as it is imperative that you return to your primary care physician (or establish a relationship with a primary care physician if you do not have one) for your aftercare needs so that they can reassess your need for medications and monitor your lab values   Increase activity slowly   Complete by: As directed       Allergies as of 01/05/2020   No Known Allergies      Medication List     STOP taking these medications    lisinopril 2.5 MG tablet Commonly known as: ZESTRIL       TAKE these medications    albuterol 108 (90 Base) MCG/ACT inhaler Commonly known as: VENTOLIN HFA Inhale 2 puffs into the lungs every 6 (six) hours as needed for wheezing or shortness of breath. What changed:   how much to take  when to take this  reasons to take this   amLODipine 5 MG tablet Commonly known as: NORVASC Take 1 tablet (5 mg total) by mouth daily.   furosemide 20 MG  tablet Commonly known as: LASIX Take 1 tablet (20 mg total) by mouth daily for 14 days.   metoprolol tartrate 25 MG tablet Commonly known as: LOPRESSOR Take 1 tablet (25 mg total) by mouth 2 (two) times daily.        Follow-up Information     Derek Jack, MD. Call in 2 day(s).   Specialty: Hematology Contact information: [address] [place] [address] [phone]         Tanda Rockers, MD Follow up in 1 week(s).   Specialty: Pulmonary Disease Contact information: [place] [address] [phone]                No Known Allergies  The results of significant diagnostics from this hospitalization (including imaging, microbiology, ancillary and laboratory) are listed below for reference.    Microbiology: Recent Results (from the past 240 hour(s))  Culture, blood (routine x 2)     Status:  None   Collection Time: 12/30/19 11:19 AM   Specimen: Left Antecubital; Blood  Result Value Ref Range Status   Specimen Description   Final    LEFT ANTECUBITAL BOTTLES DRAWN AEROBIC AND ANAEROBIC   Special Requests Blood Culture adequate volume  Final   Culture   Final    NO GROWTH 5 DAYS Performed at [facility], [address], [place], [place] [address]    Report Status 01/04/2020 FINAL  Final  Culture, blood (routine x 2)     Status: None   Collection Time: 12/30/19 11:20 AM   Specimen: Right Antecubital; Blood  Result Value Ref Range Status   Specimen Description   Final    RIGHT ANTECUBITAL BOTTLES DRAWN AEROBIC AND ANAEROBIC   Special Requests   Final    Blood Culture results may not be optimal due to an inadequate volume of blood received in culture bottles   Culture   Final    NO GROWTH 5 DAYS Performed at [facility], [address], [place], [place] [address]    Report Status 01/04/2020 FINAL  Final  Respiratory Panel by RT PCR (Flu A&B, Covid) - Nasopharyngeal Swab     Status: None   Collection Time: 12/30/19  4:18 PM   Specimen: Nasopharyngeal Swab  Result Value Ref Range Status   SARS Coronavirus 2 by RT PCR NEGATIVE NEGATIVE Final    Comment: (NOTE) SARS-CoV-2 target nucleic acids are NOT DETECTED.  The SARS-CoV-2 RNA is generally detectable in upper respiratoy specimens during the acute phase of infection. The lowest concentration of SARS-CoV-2 viral copies this assay can detect is 131 copies/mL. A negative result does not preclude SARS-Cov-2 infection and should not be used as the sole basis for treatment or other patient management decisions. A negative result may occur with  improper specimen collection/handling, submission of specimen other than nasopharyngeal swab, presence of viral mutation(s) within the areas targeted by this assay, and inadequate number of viral copies (<131 copies/mL). A negative result must be combined with clinical observations,  patient history, and epidemiological information. The expected result is Negative.  Fact Sheet for Patients:  PinkCheek.be  Fact Sheet for Healthcare Providers:  GravelBags.it  This test is no t yet approved or cleared by the [place] FDA and  has been authorized for detection and/or diagnosis of SARS-CoV-2 by FDA under an Emergency Use Authorization (EUA). This EUA will remain  in effect (meaning this test can be used) for the duration of the COVID-19 declaration under Section  564(b)(1) of the Act, 21 U.S.C. section 360bbb-3(b)(1), unless the authorization is terminated or revoked sooner.     Influenza A by PCR NEGATIVE NEGATIVE Final   Influenza B by PCR NEGATIVE NEGATIVE Final    Comment: (NOTE) The Xpert Xpress SARS-CoV-2/FLU/RSV assay is intended as an aid in  the diagnosis of influenza from Nasopharyngeal swab specimens and  should not be used as a sole basis for treatment. Nasal washings and  aspirates are unacceptable for Xpert Xpress SARS-CoV-2/FLU/RSV  testing.  Fact Sheet for Patients: PinkCheek.be  Fact Sheet for Healthcare Providers: GravelBags.it  This test is not yet approved or cleared by the [place] FDA and  has been authorized for detection and/or diagnosis of SARS-CoV-2 by  FDA under an Emergency Use Authorization (EUA). This EUA will remain  in effect (meaning this test can be used) for the duration of the  Covid-19 declaration under Section 564(b)(1) of the Act, 21  U.S.C. section 360bbb-3(b)(1), unless the authorization is  terminated or revoked. Performed at [facility], [address], [place], [place] [address]     Procedures/Studies: . DG Chest 1 View .  Marland Kitchen Result Date: 01/04/2020 . CLINICAL DATA:  Left thoracentesis, pleural effusions EXAM: CHEST  1 VIEW COMPARISON:  01/02/2020 FINDINGS: Notable reduction in size of the  left pleural effusion. No significant pneumothorax. Stable enlargement of the cardiopericardial silhouette. Atherosclerotic calcification of the aortic arch. Bony demineralization. IMPRESSION: 1. Notable reduction in size of the left pleural effusion, status post thoracentesis. No significant pneumothorax. 2. Stable enlargement of the cardiopericardial silhouette. Electronically Signed   By: Van Clines M.D.   On: 01/04/2020 09:08  .  Marland Kitchen DG Chest 2 View .  Marland Kitchen Result Date: 01/02/2020 . CLINICAL DATA:  Shortness of breath. Follow-up left pleural effusion. EXAM: CHEST - 2 VIEW COMPARISON:  12/30/2019 FINDINGS: Cardiomegaly remains stable. Previously seen diffuse interstitial infiltrates have resolved. Increased consolidation is seen in the left lower lobe since prior exam. Small left pleural effusion cannot be excluded. IMPRESSION: Increased left lower lobe consolidation. Small left pleural effusion cannot be excluded. Stable cardiomegaly. Resolution of previously seen diffuse interstitial infiltrates. Electronically Signed   By: Marlaine Hind M.D.   On: 01/02/2020 11:22  .  Marland Kitchen CT CHEST W CONTRAST .  Marland Kitchen Result Date: 01/04/2020 . CLINICAL DATA:  Non-small cell lung cancer. Patient underwent thoracentesis today. EXAM: CT CHEST WITH CONTRAST TECHNIQUE: Multidetector CT imaging of the chest was performed during intravenous contrast administration. CONTRAST:  21mL OMNIPAQUE IOHEXOL 300 MG/ML  SOLN COMPARISON:  Abdominopelvic CT 12/31/2019.  Chest CT 12/30/2019. FINDINGS: Cardiovascular: Moderate atherosclerosis of the aorta, great vessels and coronary arteries. No acute vascular findings are seen. There is suboptimal opacification of the pulmonary arteries without evidence of acute pulmonary embolism. Mild cardiomegaly and small to moderate pericardial effusion are again noted. Mediastinum/Nodes: Again demonstrated is extensive confluent mediastinal and hilar adenopathy, similar to previous CT. Right  paratracheal node measures approximately 3.3 x 2.9 cm on image 37/2. AP window node measures 3.0 x 3.6 cm on image 37/2. There is a subcarinal node measuring 2.4 cm short axis on image 52/2. Mildly enlarged left axillary lymph nodes are stable, measuring up to 1.6 cm on image 32/2. The thyroid gland, trachea and esophagus demonstrate no significant findings. Lungs/Pleura: The left pleural effusion has decreased in volume and appears partially loculated. There is a new small dependent right pleural effusion. No pneumothorax. The overall aeration of the left lung has improved with a residual left  infrahilar mass-like density measuring up to 5.8 x 5.1 cm on image 60/6. There is diffuse central airway thickening in both lungs with narrowing of the left lower lobe central bronchi. Upper abdomen: Again demonstrated is a left adrenal nodule measuring 2.9 x 2.1 cm, suspicious for a metastasis. There is no right adrenal nodule. A nodular density projecting medial to the lower pole of the right kidney on image 28/3 likely represents bowel based on recent prior abdominal CT. No liver lesions are seen. Musculoskeletal/Chest wall: There is no chest wall mass or suspicious osseous finding. Mild degenerative changes throughout the spine. IMPRESSION: 1. Interval decreased volume of left pleural effusion following thoracentesis. No pneumothorax. 2. Improved aeration of the left lung with residual left infrahilar mass-like density, possibly reflecting the primary bronchogenic carcinoma. 3. Grossly stable left axillary, confluent mediastinal and hilar adenopathy consistent with metastatic disease. 4. Stable left adrenal nodule, suspicious for metastasis. 5. New small dependent right pleural effusion. Stable pericardial effusion. 6. Aortic Atherosclerosis (ICD10-I70.0). Electronically Signed   By: Richardean Sale M.D.   On: 01/04/2020 20:20  .  Marland Kitchen CT Chest W Contrast .  . Result Date: 12/30/2019 . CLINICAL DATA:  Large left pleural  effusion on chest x-ray. EXAM: CT CHEST WITH CONTRAST TECHNIQUE: Multidetector CT imaging of the chest was performed during intravenous contrast administration. CONTRAST:  30mL OMNIPAQUE IOHEXOL 300 MG/ML  SOLN COMPARISON:  Chest x-ray earlier today. FINDINGS: Cardiovascular: Heart is enlarged. Small to moderate pericardial effusion. Atherosclerotic calcification is noted in the wall of the thoracic aorta. Mediastinum/Nodes: Supraclavicular, thoracic inlet, mediastinal, left internal mammary and bilateral hilar lymphadenopathy evident. Index precarinal lymph node measures 2.5 cm short axis on image 44/series 3. Subcarinal lymph node measures 2.0 cm short axis on 63/3. 1.6 cm short axis right hilar node visible on 57/3 1.6 cm soft tissue nodule in the anterior left hilum is probably a lymph node (image 50/3). Tiny hiatal hernia. The esophagus has normal imaging features. No right axillary lymphadenopathy. 13 mm short axis left subpectoral node is visible on 28/3 with mild left axillary lymphadenopathy evident. Lungs/Pleura: Large left pleural effusion has anterior loculated component. Some aerated lung noted left upper lobe with otherwise near complete collapse of the left upper and lower lobes. Multiple small enhancing pleural nodules are seen in the posterior left hemithorax (paraspinal on 70/3, posterior on 107/3, and posterior on 113/3. Upper Abdomen: 2.4 cm left adrenal nodule cannot be definitively characterized. No definite focal lesion within the visualized liver. Musculoskeletal: No worrisome lytic or sclerotic osseous abnormality. No obvious left breast mass although the entire left breast has not been included in the field of view. IMPRESSION: 1. Large left pleural effusion has anterior loculated component and near complete collapse of the left upper and lower lobes. 2. Supraclavicular, mediastinal, bilateral hilar, and left subpectoral/axillary lymphadenopathy consistent with metastatic disease. No  primary lesion evident although left lung cancer or left breast cancer (left breast incompletely visualized on this study) would be distinct considerations. 3. 2.4 cm left adrenal nodule cannot be definitively characterized. Metastatic disease a concern. 4. Small to moderate pericardial effusion. 5. Aortic Atherosclerosis (ICD10-I70.0). Electronically Signed   By: Misty Stanley M.D.   On: 12/30/2019 14:29  .  Marland Kitchen CT ABDOMEN PELVIS W CONTRAST .  Marland Kitchen Result Date: 12/31/2019 . CLINICAL DATA:  Cancer of unknown primary. EXAM: CT ABDOMEN AND PELVIS WITH CONTRAST TECHNIQUE: Multidetector CT imaging of the abdomen and pelvis was performed using the standard protocol following bolus administration of intravenous  contrast. CONTRAST:  35mL OMNIPAQUE IOHEXOL 300 MG/ML  SOLN COMPARISON:  December 30, 2019 FINDINGS: Lower chest: Moderate pericardial effusion. Partial visualization of heterogeneously enhancing LEFT lower lung opacities as well as a LEFT pleural effusion. 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 Trace RIGHT pleural effusion. Mild interlobular septal thickening likely reflecting underlying pulmonary edema. Hepatobiliary: Focal fatty deposition adjacent to the falciform ligament. Excreted contrast within the gallbladder. No intrahepatic or extrahepatic biliary ductal dilation. Portal veins are patent. Pancreas: Unremarkable. No pancreatic ductal dilatation or surrounding inflammatory changes. Spleen: Normal in size without focal abnormality. Adrenals/Urinary Tract: There is a 2.4 cm LEFT adrenal nodule. RIGHT adrenal gland is unremarkable. No hydronephrosis. Kidneys enhance symmetrically. Punctate nonobstructive RIGHT-sided nephrolithiasis. Bladder is unremarkable. Stomach/Bowel: Metallic clips within the pelvis the level of the rectum and LEFT pelvic sidewall. Small hiatal hernia. No focal or diffuse bowel wall thickening. Appendix is  normal. No evidence of bowel obstruction. Moderate colonic stool burden. Vascular/Lymphatic: There is a 11 mm likely lymph node posterior to the LEFT diaphragmatic crus (series 2, image 10). There is a prominent LEFT perinephric lymph node which measures 8 mm in the short axis (series 2, image 25). Moderate atherosclerotic calcifications throughout the aorta. Reproductive: Status post hysterectomy. No adnexal masses. Other: Trace free fluid. Musculoskeletal: Osteopenia. Multilevel degenerative changes of the thoracolumbar spine. A LEFT-sided pars defect at L3-4. Degenerative changes of the RIGHT greater than LEFT hip. IMPRESSION: 1. No evidence of primary intra-abdominal or intrapelvic malignancy. Partial visualization of heterogeneously enhancing LEFT lower lung opacities as well as a LEFT pleural effusion. 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 Findings may reflect underlying primary pulmonary malignancy versus metastatic disease. 2. There is a 2.4 cm LEFT adrenal nodule. Findings are concerning for metastatic disease. This could be further evaluated with dedicated adrenal protocol CT or MRI if clinically indicated. 3. Prominent lymph node posterior to the LEFT diaphragmatic crus and prominent LEFT perinephric lymph node which are nonspecific but likely reflect nodal metastatic disease. 4. Moderate pericardial effusion. 5. Small RIGHT pleural effusion and likely pulmonary edema. Aortic Atherosclerosis (ICD10-I70.0). Electronically Signed   By: Valentino Saxon MD   On: 12/31/2019 13:54  .  Marland Kitchen DG Chest Portable 1 View .  Marland Kitchen Result Date: 12/30/2019 . CLINICAL DATA:  Post thoracentesis on LEFT EXAM: PORTABLE CHEST 1 VIEW COMPARISON:  Portable exam 1738 hours compared to 10/11 hours FINDINGS: Enlargement of cardiac silhouette with vascular congestion. Atherosclerotic calcification aorta. Peribronchial thickening. Scattered interstitial  infiltrates, question pulmonary edema, accentuated by expiratory technique. Significant decrease in LEFT pleural effusion post thoracentesis, with residual atelectasis at LEFT base. No pneumothorax. IMPRESSION: No pneumothorax following LEFT thoracentesis. Electronically Signed   By: Lavonia Dana M.D.   On: 12/30/2019 17:46  .  Marland Kitchen DG Chest Portable 1 View .  Marland Kitchen Result Date: 12/30/2019 . CLINICAL DATA:  74 year old female with shortness of breath EXAM: PORTABLE CHEST 1 VIEW COMPARISON:  None. FINDINGS: Opacity of the left chest obscures the left hemidiaphragm and the left heart border with meniscus at the top of the opacity. No pneumothorax. Coarsened interstitial markings. No confluent airspace disease within the right lung. IMPRESSION: Large left pleural effusion with associated atelectasis/consolidation. Electronically Signed   By: Corrie Mckusick D.O.   On: 12/30/2019 10:21  .  Marland Kitchen ECHOCARDIOGRAM COMPLETE .  Marland Kitchen Result Date: 01/01/2020 .    ECHOCARDIOGRAM REPORT   Patient Name:  Sara Ferguson Date of Exam: 01/01/2020 Medical Rec #:  [id number]      Height:       64.0 in Accession #:    [id number]     Weight:       160.1 lb Date of Birth:  1945/08/25      BSA:          1.780 m Patient Age:    39 years       BP:           151/73 mmHg Patient Gender: F              HR:           111 bpm. Exam Location:  [facility] Procedure: 2D Echo, Cardiac Doppler and Color Doppler STAT ECHO Indications:    Pericardial effusion  History:        Patient has no prior history of Echocardiogram examinations.                 Pericardial Disease, Signs/Symptoms:Shortness of Breath and                 Large pleural effusion; Risk Factors:Hypertension and Diabetes.  Sonographer:    Dustin Flock RDCS Referring Phys: [place]  1. Left ventricular ejection fraction, by estimation, is 40 to 45%. The left ventricle has mildly decreased function. The left ventricle demonstrates global hypokinesis. There is moderate  concentric left ventricular hypertrophy. Indeterminate diastolic filling due to E-A fusion.  2. Right ventricular systolic function is normal. The right ventricular size is normal. Tricuspid regurgitation signal is inadequate for assessing PA pressure.  3. A small pericardial effusion is present. The pericardial effusion is circumferential. There is no evidence of cardiac tamponade.  4. The mitral valve is normal in structure. No evidence of mitral valve regurgitation. No evidence of mitral stenosis.  5. The aortic valve is normal in structure. Aortic valve regurgitation is not visualized. No aortic stenosis is present.  6. The inferior vena cava is dilated in size with <50% respiratory variability, suggesting right atrial pressure of 15 mmHg. FINDINGS  Left Ventricle: Left ventricular ejection fraction, by estimation, is 40 to 45%. The left ventricle has mildly decreased function. The left ventricle demonstrates global hypokinesis. The left ventricular internal cavity size was normal in size. There is  moderate concentric left ventricular hypertrophy. Indeterminate diastolic filling due to E-A fusion. Right Ventricle: The right ventricular size is normal. No increase in right ventricular wall thickness. Right ventricular systolic function is normal. Tricuspid regurgitation signal is inadequate for assessing PA pressure. Left Atrium: Left atrial size was normal in size. Right Atrium: Right atrial size was normal in size. Pericardium: A small pericardial effusion is present. The pericardial effusion is circumferential. There is no evidence of cardiac tamponade. Mitral Valve: The mitral valve is normal in structure. Mild mitral annular calcification. No evidence of mitral valve regurgitation. No evidence of mitral valve stenosis. Tricuspid Valve: The tricuspid valve is normal in structure. Tricuspid valve regurgitation is not demonstrated. No evidence of tricuspid stenosis. Aortic Valve: The aortic valve is normal in  structure. Aortic valve regurgitation is not visualized. No aortic stenosis is present. Pulmonic Valve: The pulmonic valve was normal in structure. Pulmonic valve regurgitation is not visualized. No evidence of pulmonic stenosis. Aorta: The aortic root is normal in size and structure. Venous: The inferior vena cava is dilated in size with less than 50% respiratory variability, suggesting right atrial pressure of 15 mmHg. IAS/Shunts:  No atrial level shunt detected by color flow Doppler.  LEFT VENTRICLE PLAX 2D LVIDd:         3.09 cm  Diastology LVIDs:         2.27 cm  LV e' medial:    4.24 cm/s LV PW:         1.65 cm  LV E/e' medial:  23.2 LV IVS:        1.67 cm  LV e' lateral:   4.68 cm/s LVOT diam:     1.80 cm  LV E/e' lateral: 21.0 LV SV:         42 LV SV Index:   23 LVOT Area:     2.54 cm  RIGHT VENTRICLE RV Basal diam:  2.77 cm RV S prime:     13.20 cm/s TAPSE (M-mode): 2.5 cm LEFT ATRIUM             Index       RIGHT ATRIUM           Index LA diam:        3.60 cm 2.02 cm/m  RA Area:     12.30 cm LA Vol (A2C):   46.0 ml 25.85 ml/m RA Volume:   28.50 ml  16.01 ml/m LA Vol (A4C):   49.0 ml 27.53 ml/m LA Biplane Vol: 52.1 ml 29.28 ml/m  AORTIC VALVE LVOT Vmax:   86.70 cm/s LVOT Vmean:  63.300 cm/s LVOT VTI:    0.164 m  AORTA Ao Root diam: 2.70 cm MITRAL VALVE MV Area (PHT): 12.04 cm    SHUNTS MV Decel Time: 63 msec      Systemic VTI:  0.16 m MV E velocity: 98.50 cm/s   Systemic Diam: 1.80 cm MV A velocity: 147.00 cm/s MV E/A ratio:  0.67 Mihai Croitoru MD Electronically signed by Sanda Klein MD Signature Date/Time: 01/01/2020/1:41:21 PM    Final   .  . US THORACENTESIS ASP PLEURAL SPACE W/IMG GUIDE .  Marland Kitchen Result Date: 01/04/2020 . INDICATION: Left pleural effusion Known Non small cell lung cancer EXAM: ULTRASOUND GUIDED LEFT THORACENTESIS MEDICATIONS: 10 cc 1% lidocaine. COMPLICATIONS: None immediate. PROCEDURE: An ultrasound guided thoracentesis was thoroughly discussed with the patient and questions  answered. The benefits, risks, alternatives and complications were also discussed. The patient understands and wishes to proceed with the procedure. Written consent was obtained. Ultrasound was performed to localize and mark an adequate pocket of fluid in the left chest. The area was then prepped and draped in the normal sterile fashion. 1% Lidocaine was used for local anesthesia. Under ultrasound guidance a 19 G Yueh catheter was introduced. Thoracentesis was performed. The catheter was removed and a dressing applied. FINDINGS: A total of approximately 850 cc of serosanguinous fluid was removed. Samples were sent to the laboratory as requested by the clinical team. IMPRESSION: Successful ultrasound guided left thoracentesis yielding 850 cc of pleural fluid. Postprocedural chest radiograph showed no pneumothorax. Read by Lavonia Drafts [facility] Electronically Signed   By: Van Clines M.D.   On: 01/04/2020 09:03  .  Marland Kitchen US THORACENTESIS ASP PLEURAL SPACE W/IMG GUIDE .  Marland Kitchen Result Date: 12/30/2019 . INDICATION: LEFT pleural effusion EXAM: ULTRASOUND GUIDED DIAGNOSTIC AND THERAPEUTIC LEFT THORACENTESIS MEDICATIONS: None. COMPLICATIONS: None immediate. PROCEDURE: An ultrasound guided thoracentesis was thoroughly discussed with the patient and questions answered. The benefits, risks, alternatives and complications were also discussed. The patient understands and wishes to proceed with the procedure. Written consent was obtained. Ultrasound was performed to  localize and mark an adequate pocket of fluid in the LEFT chest. The area was then prepped and draped in the normal sterile fashion. 1% Lidocaine was used for local anesthesia. Under ultrasound guidance a 8 French thoracentesis catheter was introduced. Thoracentesis was performed. The catheter was removed and a dressing applied. FINDINGS: A total of approximately 1.08 L of cloudy yellow fluid was removed. Samples were sent to the laboratory as requested by the  clinical team. IMPRESSION: Successful ultrasound guided LEFT thoracentesis yielding 1.08 L of pleural fluid. Electronically Signed   By: Lavonia Dana M.D.   On: 12/30/2019 17:42  .   Labs: BNP (last 3 results) Recent Labs    01/01/20 0617  BNP 50.2   Basic Metabolic Panel: Recent Labs  Lab 12/31/19 0656 01/01/20 0617 01/02/20 0703 01/03/20 0602 01/04/20 0549  NA 137 135 135 134* 133*  K 4.0 3.5 4.0 4.3 4.1  CL 102 99 101 100 99  CO2 25 27 26 25 25   GLUCOSE 131* 153* 151* 113* 108*  BUN 16 19 18 17 18   CREATININE 0.70 0.78 0.81 0.78 0.76  CALCIUM 9.3 9.0 8.8* 9.2 8.7*   Liver Function Tests: Recent Labs  Lab 12/30/19 1118 12/30/19 1646 12/31/19 0656  AST 29  --  21  ALT 46*  --  35  ALKPHOS 143*  --  114  BILITOT 0.6  --  0.7  PROT 8.6* 8.1 7.4  ALBUMIN 3.7  --  3.1*   No results for input(s): LIPASE, AMYLASE in the last 168 hours. No results for input(s): AMMONIA in the last 168 hours. CBC: Recent Labs  Lab 12/30/19 1118 12/31/19 0656  WBC 6.6 7.0  NEUTROABS 5.3  --   HGB 13.7 13.3  HCT 43.1 42.3  MCV 88.5 89.8  PLT 364 372   Cardiac Enzymes: No results for input(s): CKTOTAL, CKMB, CKMBINDEX, TROPONINI in the last 168 hours. BNP: Invalid input(s): POCBNP CBG: Recent Labs  Lab 01/04/20 0740 01/04/20 1118 01/04/20 1647 01/04/20 2105 01/05/20 0728  GLUCAP 110* 251* 154* 201* 79   D-Dimer No results for input(s): DDIMER in the last 72 hours. Hgb A1c No results for input(s): HGBA1C in the last 72 hours. Lipid Profile No results for input(s): CHOL, HDL, LDLCALC, TRIG, CHOLHDL, LDLDIRECT in the last 72 hours. Thyroid function studies No results for input(s): TSH, T4TOTAL, T3FREE, THYROIDAB in the last 72 hours.  Invalid input(s): FREET3 Anemia work up No results for input(s): VITAMINB12, FOLATE, FERRITIN, TIBC, IRON, RETICCTPCT in the last 72 hours. Urinalysis No results found for: COLORURINE, APPEARANCEUR, [place], [place], [place], [place],  [place], [place], [place], UROBILINOGEN, NITRITE, LEUKOCYTESUR Sepsis Labs Invalid input(s): PROCALCITONIN,  WBC,  LACTICIDVEN Microbiology Recent Results (from the past 240 hour(s))  Culture, blood (routine x 2)     Status: None   Collection Time: 12/30/19 11:19 AM   Specimen: Left Antecubital; Blood  Result Value Ref Range Status   Specimen Description   Final    LEFT ANTECUBITAL BOTTLES DRAWN AEROBIC AND ANAEROBIC   Special Requests Blood Culture adequate volume  Final   Culture   Final    NO GROWTH 5 DAYS Performed at [facility], [address] [place], [place] [address]    Report Status 01/04/2020 FINAL  Final  Culture, blood (routine x 2)     Status: None   Collection Time: 12/30/19 11:20 AM   Specimen: Right Antecubital; Blood  Result Value Ref Range Status   Specimen Description   Final  RIGHT ANTECUBITAL BOTTLES DRAWN AEROBIC AND ANAEROBIC   Special Requests   Final    Blood Culture results may not be optimal due to an inadequate volume of blood received in culture bottles   Culture   Final    NO GROWTH 5 DAYS Performed at [facility], [address], [place], [place] [address]    Report Status 01/04/2020 FINAL  Final  Respiratory Panel by RT PCR (Flu A&B, Covid) - Nasopharyngeal Swab     Status: None   Collection Time: 12/30/19  4:18 PM   Specimen: Nasopharyngeal Swab  Result Value Ref Range Status   SARS Coronavirus 2 by RT PCR NEGATIVE NEGATIVE Final    Comment: (NOTE) SARS-CoV-2 target nucleic acids are NOT DETECTED.  The SARS-CoV-2 RNA is generally detectable in upper respiratoy specimens during the acute phase of infection. The lowest concentration of SARS-CoV-2 viral copies this assay can detect is 131 copies/mL. A negative result does not preclude SARS-Cov-2 infection and should not be used as the sole basis for treatment or other patient management decisions. A negative result may occur with  improper specimen collection/handling, submission  of specimen other than nasopharyngeal swab, presence of viral mutation(s) within the areas targeted by this assay, and inadequate number of viral copies (<131 copies/mL). A negative result must be combined with clinical observations, patient history, and epidemiological information. The expected result is Negative.  Fact Sheet for Patients:  PinkCheek.be  Fact Sheet for Healthcare Providers:  GravelBags.it  This test is no t yet approved or cleared by the [place] FDA and  has been authorized for detection and/or diagnosis of SARS-CoV-2 by FDA under an Emergency Use Authorization (EUA). This EUA will remain  in effect (meaning this test can be used) for the duration of the COVID-19 declaration under Section 564(b)(1) of the Act, 21 U.S.C. section 360bbb-3(b)(1), unless the authorization is terminated or revoked sooner.     Influenza A by PCR NEGATIVE NEGATIVE Final   Influenza B by PCR NEGATIVE NEGATIVE Final    Comment: (NOTE) The Xpert Xpress SARS-CoV-2/FLU/RSV assay is intended as an aid in  the diagnosis of influenza from Nasopharyngeal swab specimens and  should not be used as a sole basis for treatment. Nasal washings and  aspirates are unacceptable for Xpert Xpress SARS-CoV-2/FLU/RSV  testing.  Fact Sheet for Patients: PinkCheek.be  Fact Sheet for Healthcare Providers: GravelBags.it  This test is not yet approved or cleared by the [place] FDA and  has been authorized for detection and/or diagnosis of SARS-CoV-2 by  FDA under an Emergency Use Authorization (EUA). This EUA will remain  in effect (meaning this test can be used) for the duration of the  Covid-19 declaration under Section 564(b)(1) of the Act, 21  U.S.C. section 360bbb-3(b)(1), unless the authorization is  terminated or revoked. Performed at [facility], [address]  [place], [place] [address]      Time coordinating discharge: 25 minutes  SIGNED: Antonieta Pert, MD  [facility] 01/05/2020, 9:41 AM  If 7PM-7AM, please contact night-coverage [email or web address]

## 2020-01-09 ENCOUNTER — Other Ambulatory Visit: Payer: Self-pay | Admitting: Physician Assistant

## 2020-01-09 ENCOUNTER — Other Ambulatory Visit (HOSPITAL_COMMUNITY): Payer: Self-pay

## 2020-01-09 ENCOUNTER — Encounter (HOSPITAL_COMMUNITY)
Admission: RE | Admit: 2020-01-09 | Discharge: 2020-01-09 | Disposition: A | Discharge status: Home / Self Care | Payer: Self-pay | Source: Ambulatory Visit | Attending: Hematology | Admitting: Hematology

## 2020-01-09 DIAGNOSIS — J910 Malignant pleural effusion: Secondary | ICD-10-CM

## 2020-01-09 DIAGNOSIS — C3490 Malignant neoplasm of unspecified part of unspecified bronchus or lung: Secondary | ICD-10-CM | POA: Insufficient documentation

## 2020-01-09 LAB — GLUCOSE, CAPILLARY: Glucose-Capillary: 229 mg/dL — ABNORMAL HIGH (ref 70–99)

## 2020-01-09 MED ORDER — FLUDEOXYGLUCOSE F - 18 (FDG) INJECTION
7.6000 | Freq: Once | INTRAVENOUS | Status: AC | PRN
  Administered 2020-01-09: 7.6 via INTRAVENOUS

## 2020-01-10 ENCOUNTER — Inpatient Hospital Stay: Payer: Self-pay | Admitting: Physician Assistant

## 2020-01-10 ENCOUNTER — Inpatient Hospital Stay: Payer: Self-pay

## 2020-01-10 ENCOUNTER — Telehealth: Payer: Self-pay | Admitting: *Deleted

## 2020-01-10 ENCOUNTER — Telehealth: Payer: Self-pay | Admitting: Physician Assistant

## 2020-01-10 NOTE — Telephone Encounter (Signed)
I called to re-schedule Sara Ferguson.  I called her niece and they would like to be seen this Friday.  Dr.  Julien Nordmann does not work on Friday's so I told her that I would update new patient coordinator to call and re-schedule with another provider.  I will update now.

## 2020-01-10 NOTE — Telephone Encounter (Signed)
I first called the patient's phone to see if she was coming to her appointment today. Her voicemail is full. I then called the patient's niece, [name], after seeing a note in the computer to contact her regarding scheduling appointments. I spoke to the patient's niece. She states she tried calling scheduling yesterday to reschedule the patient's appointments. She states she is not able to come today due to her work schedule and is requesting someone call her back to coordinate the appointment with her work schedule. I told her someone from scheduling will call her back. The patient is currently staying with her niece here in [place].

## 2020-01-13 ENCOUNTER — Encounter: Payer: Self-pay | Admitting: *Deleted

## 2020-01-13 ENCOUNTER — Other Ambulatory Visit: Payer: Self-pay

## 2020-01-13 ENCOUNTER — Inpatient Hospital Stay: Payer: Self-pay | Attending: Physician Assistant | Admitting: Hematology and Oncology

## 2020-01-13 ENCOUNTER — Encounter: Payer: Self-pay | Admitting: Hematology and Oncology

## 2020-01-13 ENCOUNTER — Inpatient Hospital Stay: Payer: Self-pay

## 2020-01-13 DIAGNOSIS — J90 Pleural effusion, not elsewhere classified: Secondary | ICD-10-CM | POA: Insufficient documentation

## 2020-01-13 DIAGNOSIS — C3492 Malignant neoplasm of unspecified part of left bronchus or lung: Secondary | ICD-10-CM

## 2020-01-13 DIAGNOSIS — E119 Type 2 diabetes mellitus without complications: Secondary | ICD-10-CM | POA: Insufficient documentation

## 2020-01-13 DIAGNOSIS — I313 Pericardial effusion (noninflammatory): Secondary | ICD-10-CM | POA: Insufficient documentation

## 2020-01-13 DIAGNOSIS — I10 Essential (primary) hypertension: Secondary | ICD-10-CM | POA: Insufficient documentation

## 2020-01-13 DIAGNOSIS — E279 Disorder of adrenal gland, unspecified: Secondary | ICD-10-CM | POA: Insufficient documentation

## 2020-01-13 DIAGNOSIS — R634 Abnormal weight loss: Secondary | ICD-10-CM | POA: Insufficient documentation

## 2020-01-13 DIAGNOSIS — C3490 Malignant neoplasm of unspecified part of unspecified bronchus or lung: Secondary | ICD-10-CM | POA: Insufficient documentation

## 2020-01-13 DIAGNOSIS — Z90710 Acquired absence of both cervix and uterus: Secondary | ICD-10-CM | POA: Insufficient documentation

## 2020-01-13 DIAGNOSIS — Z7189 Other specified counseling: Secondary | ICD-10-CM | POA: Insufficient documentation

## 2020-01-13 NOTE — Progress Notes (Signed)
[name] CONSULT NOTE  Patient Care Team: System, Provider Not In as PCP - General Dishmon, [name], RN as Oncology Nurse Navigator (Oncology)  CHIEF COMPLAINTS/PURPOSE OF CONSULTATION:   Adenocarcinoma of the lung  ASSESSMENT & PLAN:  No problem-specific Assessment & Plan notes found for this encounter.  This is a very pleasant 74 year old female patient with past medical history significant for hypertension, diabetes mellitus type 2 referred to hematology and medical oncology for new diagnosis of adenocarcinoma of the lung. [name] arrived to the appointment today with her niece [name].  According to patient, she has been having symptoms for the past 4 to 5 months predominantly shortness of breath, cough without any hemoptysis and some weight loss which she attributed to the dietary changes.  She was admitted at [facility] with the above-mentioned complaints and had imaging suggestive of pleural effusion and left lung, mild to moderate pericardial effusion and g changes reflecting primary bronchogenic carcinoma grossly stable left axillary confluent mediastinal and hilar adenopathy.  CT abdomen with left adrenal nodule suggestive of possible metastatic disease.  She had cytology which confirmed the diagnosis of adenocarcinoma.  Molecular spending. 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 She is here for an initial discussion about the new diagnosis of adenocarcinoma  of the left lung.  She is a never smoker.  Given her non-smoking history and adenocarcinoma histology, it is extremely beneficial to proceed with molecular evaluation.  If foundation 1 testing demonstrates presence of any targetable mutation, we will proceed with first-line molecular therapy.  I explained that foundation one testing may take 2 to 3 weeks to results.  If she is a high PD-L1 expressive and no other targetable mutations, we will proceed with first-line immunotherapy versus chemoimmunotherapy depending on expression. I have clearly explained claimed that this is a stage IV lung cancer and the intent of treatment is palliative. We will also need an MRI brain to complete staging, this has been ordered.  Anticipate initiation of treatment in the next 3 weeks.  I have very clearly explained to patient and her niece to report to me as soon as possible if there is any worsening shortness of breath since she may need repeat thoracentesis. They both expressed understanding.  Have also encouraged compliance to her current medication, her blood pressure today was very high and she did not take any of her blood pressure medications before coming to the appointment.  She does not have any symptoms consistent with hyper tensive emergency. They are agreeable to the above-mentioned plan.  Return to clinic in 3 weeks with me. Engaged NN team to assist with management. Thank you for consulting [place] in the care of this patient.  Please do not hesitate to contact us with any additional questions or concerns.  Orders Placed This Encounter  Procedures  . MR Brain W Wo Contrast    Standing Status:   Future    Standing Expiration Date:   01/12/2021    Order Specific Question:   If indicated for the ordered procedure, I authorize the administration of contrast media per Radiology protocol  Answer:   Yes    Order Specific Question:   What is the patient's sedation requirement?    Answer:   No Sedation    Order  Specific Question:   Does the patient have a pacemaker or implanted devices?    Answer:   No    Order Specific Question:   Use SRS Protocol?    Answer:   No    Order Specific Question:   Preferred imaging location?    Answer:   [facility] (table limit - 550 lbs)    HISTORY OF PRESENTING ILLNESS:   Sara Ferguson 74 y.o. female is here because of new diagnosis of adenocarcinoma lung.  Chronology  74 year old female with history of hypertension, diabetes presented to the ED with hypoxia shortness of breath found to have large left-sided pleural effusion as well as pericardial effusion. Patient tells me that she had SOB for about 5 months prior to presentation and she initially thought it was bronchitis. She has also noted some weight loss of about 15 lbs and attributed this to dietary changes. Cough noted, lot of phlegm , no hemoptysis. A day before presentation to the hospital, she noticed that she could not walk to the mailbox and hence went to the hospital. 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 Repeat chest x-ray 11/15 showed persistent consolidation/effusion of the left lower lobe, and repeat thoracentesis performed 11/17 with 850 mL fluid removal. Patient underwent CT chest showed improved aeration of the left lung with residual left infrahilar masslike density possibly reflecting the primary bronchogenic carcinoma, grossly stable left axillary confluent mediastinal and hilar adenopathy consistent with metastatic disease, left adrenal nodule suspicious for metastasis, new small dependent right pleural effusion stable pericardial effusion. CT abdomen pelvis showed no evidence of primary intra abdominal or intrapelvic malignancy. Partial visualization of heterogeneously enhancing left lower lung opacities as well as Left pleural effusion. 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 There is a 2.4 cm LEFT adrenal nodule. Findings are concerning for metastatic disease. She is here for a initial visit with her niece. She is planning to move in with her niece. Since thoracentesis, breathing is much better. She has been using her incentive spirometer and this has been very helpful. She denies any worsening cough, hemoptysis, change in bowel habits, change in urinary habits or new neurological complaints. She never smoked or chewed any tobacco products No occupational exposure. Worked in [employer] (medical) [employer], mom died of liver cancer in 37's, sister died of breast cancer in 53's She hasnt had mammogram in some time, hasnt taken COVID vaccine. Rest of the pertinent 10 point ROS reviewed and negative.  REVIEW OF SYSTEMS:    Constitutional: Denies fevers, chills or abnormal night sweats Eyes: Denies blurriness of vision, double vision or watery eyes Ears, nose, mouth, throat, and face: Denies mucositis or sore throat Respiratory: As mentioned in the HPI. Cardiovascular: Denies palpitation, chest discomfort or lower extremity swelling Gastrointestinal:  Denies nausea, heartburn or change in bowel habits Skin: Denies abnormal skin rashes Lymphatics: Denies new lymphadenopathy or easy bruising Neurological:Denies numbness, tingling or new weaknesses Behavioral/Psych: Mood is stable, no new changes  All other systems were reviewed with the patient and are negative.  MEDICAL HISTORY:  Past Medical History:  Diagnosis Date  . Arthritis   . Diabetes mellitus without complication ([place])   . Hypertension  SURGICAL HISTORY: Past Surgical History:  Procedure Laterality Date  . ABDOMINAL HYSTERECTOMY    . ECTOPIC PREGNANCY SURGERY      SOCIAL HISTORY: Social History   Socioeconomic History  . Marital status: Widowed    Spouse name: Not on file  . Number of children: Not on file  . Years of education:  Not on file  . Highest education level: Not on file  Occupational History  . Not on file  Tobacco Use  . Smoking status: Never Smoker  . Smokeless tobacco: Never Used  Substance and Sexual Activity  . Alcohol use: Not Currently  . Drug use: Never  . Sexual activity: Not on file  Other Topics Concern  . Not on file  Social History Narrative  . Not on file   Social Determinants of Health   Financial Resource Strain: Low Risk   . Difficulty of Paying Living Expenses: Not hard at all  Food Insecurity: No Food Insecurity  . Worried About Charity fundraiser in the Last Year: Never true  . Ran Out of Food in the Last Year: Never true  Transportation Needs: No Transportation Needs  . Lack of Transportation (Medical): No  . Lack of Transportation (Non-Medical): No  Physical Activity: Inactive  . Days of Exercise per Week: 0 days  . Minutes of Exercise per Session: 0 min  Stress: No Stress Concern Present  . Feeling of Stress : Not at all  Social Connections: Socially Isolated  . Frequency of Communication with Friends and Family: Once a week  . Frequency of Social Gatherings with Friends and Family: Once a week  . Attends Religious Services: Never  . Active Member of Clubs or Organizations: No  . Attends Archivist Meetings: 1 to 4 times per year  . Marital Status: Widowed  Intimate Partner Violence: Not At Risk  . Fear of Current or Ex-Partner: No  . Emotionally Abused: No  . Physically Abused: No  . Sexually Abused: No    FAMILY HISTORY: No family history on file.  ALLERGIES:  has No Known Allergies.  MEDICATIONS:  Current Outpatient Medications  Medication Sig Dispense Refill  . albuterol (VENTOLIN HFA) 108 (90 Base) MCG/ACT inhaler Inhale 2 puffs into the lungs every 6 (six) hours as needed for wheezing or shortness of breath. 1 each 1  . amLODipine (NORVASC) 5 MG tablet Take 1 tablet (5 mg total) by mouth daily. 30 tablet 1  . furosemide (LASIX) 20 MG  tablet Take 1 tablet (20 mg total) by mouth daily for 14 days. 14 tablet 0  . metoprolol tartrate (LOPRESSOR) 25 MG tablet Take 1 tablet (25 mg total) by mouth 2 (two) times daily. 60 tablet 1   No current facility-administered medications for this visit.     PHYSICAL EXAMINATION: ECOG PERFORMANCE STATUS: 2 - Symptomatic, <50% confined to bed  Vitals:   01/13/20 0909  BP: (!) 224/83  Pulse: 100  Resp: 18  Temp: 98.3 F (36.8 C)  SpO2: 93%   Filed Weights   01/13/20 0909  Weight: 168 lb 1.6 oz (76.2 kg)    GENERAL:alert, no distress and comfortable SKIN: skin color, texture, turgor are normal, no rashes or significant lesions EYES: normal, conjunctiva are pink and non-injected, sclera clear OROPHARYNX:no exudate, no erythema and lips, buccal mucosa, and tongue normal  NECK: supple, thyroid normal size, non-tender, without nodularity LYMPH: palpable supraclavicular LN on the left, multiple and small. No other lymphadenopathy LUNGS: clear to auscultation and percussion  with normal breathing effort. Left lower pleural effusion HEART: regular rate & rhythm and no murmurs and no lower extremity edema. On exam heart sounds are loud and clear, no large pericardial effusion. ABDOMEN:abdomen soft, non-tender and normal bowel sounds Musculoskeletal:no cyanosis of digits and no clubbing  PSYCH: alert & oriented x 3 with fluent speech NEURO: no focal motor/sensory deficits  LABORATORY DATA:  I have reviewed the data as listed Lab Results  Component Value Date   WBC 7.0 12/31/2019   HGB 13.3 12/31/2019   HCT 42.3 12/31/2019   MCV 89.8 12/31/2019   PLT 372 12/31/2019     Chemistry      Component Value Date/Time   NA 133 (L) 01/04/2020 0549   K 4.1 01/04/2020 0549   CL 99 01/04/2020 0549   CO2 25 01/04/2020 0549   BUN 18 01/04/2020 0549   CREATININE 0.76 01/04/2020 0549      Component Value Date/Time   CALCIUM 8.7 (L) 01/04/2020 0549   ALKPHOS 114 12/31/2019 0656   AST 21  12/31/2019 0656   ALT 35 12/31/2019 0656   BILITOT 0.7 12/31/2019 0656       RADIOGRAPHIC STUDIES: I have personally reviewed the radiological images as listed and agreed with the findings in the report. DG Chest 1 View  Result Date: 01/04/2020 CLINICAL DATA:  Left thoracentesis, pleural effusions EXAM: CHEST  1 VIEW COMPARISON:  01/02/2020 FINDINGS: Notable reduction in size of the left pleural effusion. No significant pneumothorax. Stable enlargement of the cardiopericardial silhouette. Atherosclerotic calcification of the aortic arch. Bony demineralization. IMPRESSION: 1. Notable reduction in size of the left pleural effusion, status post thoracentesis. No significant pneumothorax. 2. Stable enlargement of the cardiopericardial silhouette. Electronically Signed   By: Van Clines M.D.   On: 01/04/2020 09:08   DG Chest 2 View  Result Date: 01/02/2020 CLINICAL DATA:  Shortness of breath. Follow-up left pleural effusion. EXAM: CHEST - 2 VIEW COMPARISON:  12/30/2019 FINDINGS: Cardiomegaly remains stable. Previously seen diffuse interstitial infiltrates have resolved. Increased consolidation is seen in the left lower lobe since prior exam. Small left pleural effusion cannot be excluded. IMPRESSION: Increased left lower lobe consolidation. Small left pleural effusion cannot be excluded. Stable cardiomegaly. Resolution of previously seen diffuse interstitial infiltrates. Electronically Signed   By: Marlaine Hind M.D.   On: 01/02/2020 11:22   CT CHEST W CONTRAST  Result Date: 01/04/2020 CLINICAL DATA:  Non-small cell lung cancer. Patient underwent thoracentesis today. EXAM: CT CHEST WITH CONTRAST TECHNIQUE: Multidetector CT imaging of the chest was performed during intravenous contrast administration. CONTRAST:  101m OMNIPAQUE IOHEXOL 300 MG/ML  SOLN COMPARISON:  Abdominopelvic CT 12/31/2019.  Chest CT 12/30/2019. FINDINGS: Cardiovascular: Moderate atherosclerosis of the aorta, great vessels  and coronary arteries. No acute vascular findings are seen. There is suboptimal opacification of the pulmonary arteries without evidence of acute pulmonary embolism. Mild cardiomegaly and small to moderate pericardial effusion are again noted. Mediastinum/Nodes: Again demonstrated is extensive confluent mediastinal and hilar adenopathy, similar to previous CT. Right paratracheal node measures approximately 3.3 x 2.9 cm on image 37/2. AP window node measures 3.0 x 3.6 cm on image 37/2. There is a subcarinal node measuring 2.4 cm short axis on image 52/2. Mildly enlarged left axillary lymph nodes are stable, measuring up to 1.6 cm on image 32/2. The thyroid gland, trachea and esophagus demonstrate no significant findings. Lungs/Pleura: The left pleural effusion has decreased in volume and appears partially loculated. There is a new small dependent right pleural effusion.  No pneumothorax. The overall aeration of the left lung has improved with a residual left infrahilar mass-like density measuring up to 5.8 x 5.1 cm on image 60/6. There is diffuse central airway thickening in both lungs with narrowing of the left lower lobe central bronchi. Upper abdomen: Again demonstrated is a left adrenal nodule measuring 2.9 x 2.1 cm, suspicious for a metastasis. There is no right adrenal nodule. A nodular density projecting medial to the lower pole of the right kidney on image 28/3 likely represents bowel based on recent prior abdominal CT. No liver lesions are seen. Musculoskeletal/Chest wall: There is no chest wall mass or suspicious osseous finding. Mild degenerative changes throughout the spine. IMPRESSION: 1. Interval decreased volume of left pleural effusion following thoracentesis. No pneumothorax. 2. Improved aeration of the left lung with residual left infrahilar mass-like density, possibly reflecting the primary bronchogenic carcinoma. 3. Grossly stable left axillary, confluent mediastinal and hilar adenopathy consistent  with metastatic disease. 4. Stable left adrenal nodule, suspicious for metastasis. 5. New small dependent right pleural effusion. Stable pericardial effusion. 6. Aortic Atherosclerosis (ICD10-I70.0). Electronically Signed   By: Richardean Sale M.D.   On: 01/04/2020 20:20   CT Chest W Contrast  Result Date: 12/30/2019 CLINICAL DATA:  Large left pleural effusion on chest x-ray. EXAM: CT CHEST WITH CONTRAST TECHNIQUE: Multidetector CT imaging of the chest was performed during intravenous contrast administration. CONTRAST:  23m OMNIPAQUE IOHEXOL 300 MG/ML  SOLN COMPARISON:  Chest x-ray earlier today. FINDINGS: Cardiovascular: Heart is enlarged. Small to moderate pericardial effusion. Atherosclerotic calcification is noted in the wall of the thoracic aorta. Mediastinum/Nodes: Supraclavicular, thoracic inlet, mediastinal, left internal mammary and bilateral hilar lymphadenopathy evident. Index precarinal lymph node measures 2.5 cm short axis on image 44/series 3. Subcarinal lymph node measures 2.0 cm short axis on 63/3. 1.6 cm short axis right hilar node visible on 57/3 1.6 cm soft tissue nodule in the anterior left hilum is probably a lymph node (image 50/3). Tiny hiatal hernia. The esophagus has normal imaging features. No right axillary lymphadenopathy. 13 mm short axis left subpectoral node is visible on 28/3 with mild left axillary lymphadenopathy evident. Lungs/Pleura: Large left pleural effusion has anterior loculated component. Some aerated lung noted left upper lobe with otherwise near complete collapse of the left upper and lower lobes. Multiple small enhancing pleural nodules are seen in the posterior left hemithorax (paraspinal on 70/3, posterior on 107/3, and posterior on 113/3. Upper Abdomen: 2.4 cm left adrenal nodule cannot be definitively characterized. No definite focal lesion within the visualized liver. Musculoskeletal: No worrisome lytic or sclerotic osseous abnormality. No obvious left breast  mass although the entire left breast has not been included in the field of view. IMPRESSION: 1. Large left pleural effusion has anterior loculated component and near complete collapse of the left upper and lower lobes. 2. Supraclavicular, mediastinal, bilateral hilar, and left subpectoral/axillary lymphadenopathy consistent with metastatic disease. No primary lesion evident although left lung cancer or left breast cancer (left breast incompletely visualized on this study) would be distinct considerations. 3. 2.4 cm left adrenal nodule cannot be definitively characterized. Metastatic disease a concern. 4. Small to moderate pericardial effusion. 5. Aortic Atherosclerosis (ICD10-I70.0). Electronically Signed   By: EMisty StanleyM.D.   On: 12/30/2019 14:29   CT ABDOMEN PELVIS W CONTRAST  Result Date: 12/31/2019 CLINICAL DATA:  Cancer of unknown primary. EXAM: CT ABDOMEN AND PELVIS WITH CONTRAST TECHNIQUE: Multidetector CT imaging of the abdomen and pelvis was performed using the standard protocol  following bolus administration of intravenous contrast. CONTRAST:  22m OMNIPAQUE IOHEXOL 300 MG/ML  SOLN COMPARISON:  December 30, 2019 FINDINGS: Lower chest: Moderate pericardial effusion. Partial visualization of heterogeneously enhancing LEFT lower lung opacities as well as a LEFT pleural effusion. 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 Trace RIGHT pleural effusion. Mild interlobular septal thickening likely reflecting underlying pulmonary edema. Hepatobiliary: Focal fatty deposition adjacent to the falciform ligament. Excreted contrast within the gallbladder. No intrahepatic or extrahepatic biliary ductal dilation. Portal veins are patent. Pancreas: Unremarkable. No pancreatic ductal dilatation or surrounding inflammatory changes. Spleen: Normal in size without focal abnormality. Adrenals/Urinary Tract: There is a 2.4 cm LEFT adrenal nodule.  RIGHT adrenal gland is unremarkable. No hydronephrosis. Kidneys enhance symmetrically. Punctate nonobstructive RIGHT-sided nephrolithiasis. Bladder is unremarkable. Stomach/Bowel: Metallic clips within the pelvis the level of the rectum and LEFT pelvic sidewall. Small hiatal hernia. No focal or diffuse bowel wall thickening. Appendix is normal. No evidence of bowel obstruction. Moderate colonic stool burden. Vascular/Lymphatic: There is a 11 mm likely lymph node posterior to the LEFT diaphragmatic crus (series 2, image 10). There is a prominent LEFT perinephric lymph node which measures 8 mm in the short axis (series 2, image 25). Moderate atherosclerotic calcifications throughout the aorta. Reproductive: Status post hysterectomy. No adnexal masses. Other: Trace free fluid. Musculoskeletal: Osteopenia. Multilevel degenerative changes of the thoracolumbar spine. A LEFT-sided pars defect at L3-4. Degenerative changes of the RIGHT greater than LEFT hip. IMPRESSION: 1. No evidence of primary intra-abdominal or intrapelvic malignancy. Partial visualization of heterogeneously enhancing LEFT lower lung opacities as well as a LEFT pleural effusion. 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 Findings may reflect underlying primary pulmonary malignancy versus metastatic disease. 2. There is a 2.4 cm LEFT adrenal nodule. Findings are concerning for metastatic disease. This could be further evaluated with dedicated adrenal protocol CT or MRI if clinically indicated. 3. Prominent lymph node posterior to the LEFT diaphragmatic crus and prominent LEFT perinephric lymph node which are nonspecific but likely reflect nodal metastatic disease. 4. Moderate pericardial effusion. 5. Small RIGHT pleural effusion and likely pulmonary edema. Aortic Atherosclerosis (ICD10-I70.0). Electronically Signed   By: SValentino SaxonMD   On: 12/31/2019 13:54   NM PET Image  Initial (PI) Skull Base To Thigh  Result Date: 01/09/2020 CLINICAL DATA:  Initial treatment strategy for non-small cell lung carcinoma. EXAM: NUCLEAR MEDICINE PET SKULL BASE TO THIGH TECHNIQUE: 7.6 mCi F-18 FDG was injected intravenously. Full-ring PET imaging was performed from the skull base to thigh after the radiotracer. CT data was obtained and used for attenuation correction and anatomic localization. Fasting blood glucose: 229 mg/dl COMPARISON:  Chest CT on 03/06/2019 FINDINGS: Mediastinal blood-pool activity (background): SUV max = 2.7 Liver activity (reference): SUV max = N/A NECK: Hypermetabolic lymphadenopathy seen in the lower jugular chains bilaterally, and bilateral supraclavicular regions. Incidental CT findings:  None. CHEST: A rounded masslike opacity is seen in the central left lower lobe which measures 5.2 x 4.7 cm on image 66/4, and is hypermetabolic with SUV max of 4.9. This masslike opacity appears to obstruct the posterior left lower lobe bronchus, resulting in left lower lobe postobstructive atelectasis and pneumonitis. Another focal area of hypermetabolic activity is seen in the posteroinferior left lower lobe which measures approximately 4 cm and has an SUV max of 6.6. This may represent another left lower lobe pulmonary mass. A moderate multiloculated left pleural effusion  is seen without hypermetabolic activity. Diffuse left lung interstitial thickening is seen, suspicious for lymphangitic spread of carcinoma. Mild lymphadenopathy is seen throughout the mediastinum, bilateral hilar regions, left internal mammary chain, and left subpectoral and axillary regions, which is hypermetabolic and consistent with metastatic disease. Index lymph node in the subcarinal region measures 2.0 cm on image 61/4, with SUV max of 5.9. Incidental CT findings: Small to moderate pericardial effusion, without associated hypermetabolic activity. ABDOMEN/PELVIS: No abnormal hypermetabolic activity within the  liver, pancreas, or spleen. A 2.4 cm left adrenal mass is seen which shows mild FDG uptake with SUV max of 2.8. This is suspicious for adrenal metastasis. A 1.0 cm left retrocrural lymph node is seen on image 82/4 which is hypermetabolic, with SUV max of 4.2. No other hypermetabolic lymph nodes in the abdomen or pelvis. Incidental CT findings:  None. SKELETON: No focal hypermetabolic bone lesions to suggest skeletal metastasis. Incidental CT findings:  None. IMPRESSION: Hypermetabolic masslike opacities in the central left lower lobe and possibly the posterior-inferior left lower lobe, consistent with primary bronchogenic carcinoma. Moderate multiloculated left pleural effusion, and findings consistent with diffuse left lung lymphangitic spread of carcinoma. Hypermetabolic lymphadenopathy throughout the chest, lower neck, and left retrocrural region, consistent with metastatic disease. 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 Small to moderate pericardial effusion, without associated FDG activity. Electronically Signed   By: Marlaine Hind M.D.   On: 01/09/2020 16:04   DG Chest Portable 1 View  Result Date: 12/30/2019 CLINICAL DATA:  Post thoracentesis on LEFT EXAM: PORTABLE CHEST 1 VIEW COMPARISON:  Portable exam 1738 hours compared to 10/11 hours FINDINGS: Enlargement of cardiac silhouette with vascular congestion. Atherosclerotic calcification aorta. Peribronchial thickening. Scattered interstitial infiltrates, question pulmonary edema, accentuated by expiratory technique. Significant decrease in LEFT pleural effusion post thoracentesis, with residual atelectasis at LEFT base. No pneumothorax. IMPRESSION: No pneumothorax following LEFT thoracentesis. Electronically Signed   By: Lavonia Dana M.D.   On: 12/30/2019 17:46   DG Chest Portable 1 View  Result Date: 12/30/2019 CLINICAL DATA:  74 year old female with shortness of breath EXAM: PORTABLE CHEST 1 VIEW COMPARISON:  None. FINDINGS: Opacity of the left chest  obscures the left hemidiaphragm and the left heart border with meniscus at the top of the opacity. No pneumothorax. Coarsened interstitial markings. No confluent airspace disease within the right lung. IMPRESSION: Large left pleural effusion with associated atelectasis/consolidation. Electronically Signed   By: Corrie Mckusick D.O.   On: 12/30/2019 10:21   ECHOCARDIOGRAM COMPLETE  Result Date: 01/01/2020    ECHOCARDIOGRAM REPORT   Patient Name:   Sara Ferguson Date of Exam: 01/01/2020 Medical Rec #:  [id number]      Height:       64.0 in Accession #:    [id number]     Weight:       160.1 lb Date of Birth:  Apr 09, 1945      BSA:          1.780 m Patient Age:    69 years       BP:           151/73 mmHg Patient Gender: F              HR:           111 bpm. Exam Location:  [facility] Procedure: 2D Echo, Cardiac Doppler and Color Doppler STAT ECHO Indications:    Pericardial effusion  History:        Patient has no prior history of Echocardiogram examinations.  Pericardial Disease, Signs/Symptoms:Shortness of Breath and                 Large pleural effusion; Risk Factors:Hypertension and Diabetes.  Sonographer:    Dustin Flock RDCS Referring Phys: [place]  1. Left ventricular ejection fraction, by estimation, is 40 to 45%. The left ventricle has mildly decreased function. The left ventricle demonstrates global hypokinesis. There is moderate concentric left ventricular hypertrophy. Indeterminate diastolic filling due to E-A fusion.  2. Right ventricular systolic function is normal. The right ventricular size is normal. Tricuspid regurgitation signal is inadequate for assessing PA pressure.  3. A small pericardial effusion is present. The pericardial effusion is circumferential. There is no evidence of cardiac tamponade.  4. The mitral valve is normal in structure. No evidence of mitral valve regurgitation. No evidence of mitral stenosis.  5. The aortic valve is normal in  structure. Aortic valve regurgitation is not visualized. No aortic stenosis is present.  6. The inferior vena cava is dilated in size with <50% respiratory variability, suggesting right atrial pressure of 15 mmHg. FINDINGS  Left Ventricle: Left ventricular ejection fraction, by estimation, is 40 to 45%. The left ventricle has mildly decreased function. The left ventricle demonstrates global hypokinesis. The left ventricular internal cavity size was normal in size. There is  moderate concentric left ventricular hypertrophy. Indeterminate diastolic filling due to E-A fusion. Right Ventricle: The right ventricular size is normal. No increase in right ventricular wall thickness. Right ventricular systolic function is normal. Tricuspid regurgitation signal is inadequate for assessing PA pressure. Left Atrium: Left atrial size was normal in size. Right Atrium: Right atrial size was normal in size. Pericardium: A small pericardial effusion is present. The pericardial effusion is circumferential. There is no evidence of cardiac tamponade. Mitral Valve: The mitral valve is normal in structure. Mild mitral annular calcification. No evidence of mitral valve regurgitation. No evidence of mitral valve stenosis. Tricuspid Valve: The tricuspid valve is normal in structure. Tricuspid valve regurgitation is not demonstrated. No evidence of tricuspid stenosis. Aortic Valve: The aortic valve is normal in structure. Aortic valve regurgitation is not visualized. No aortic stenosis is present. Pulmonic Valve: The pulmonic valve was normal in structure. Pulmonic valve regurgitation is not visualized. No evidence of pulmonic stenosis. Aorta: The aortic root is normal in size and structure. Venous: The inferior vena cava is dilated in size with less than 50% respiratory variability, suggesting right atrial pressure of 15 mmHg. IAS/Shunts: No atrial level shunt detected by color flow Doppler.  LEFT VENTRICLE PLAX 2D LVIDd:         3.09 cm   Diastology LVIDs:         2.27 cm  LV e' medial:    4.24 cm/s LV PW:         1.65 cm  LV E/e' medial:  23.2 LV IVS:        1.67 cm  LV e' lateral:   4.68 cm/s LVOT diam:     1.80 cm  LV E/e' lateral: 21.0 LV SV:         42 LV SV Index:   23 LVOT Area:     2.54 cm  RIGHT VENTRICLE RV Basal diam:  2.77 cm RV S prime:     13.20 cm/s TAPSE (M-mode): 2.5 cm LEFT ATRIUM             Index       RIGHT ATRIUM  Index LA diam:        3.60 cm 2.02 cm/m  RA Area:     12.30 cm LA Vol (A2C):   46.0 ml 25.85 ml/m RA Volume:   28.50 ml  16.01 ml/m LA Vol (A4C):   49.0 ml 27.53 ml/m LA Biplane Vol: 52.1 ml 29.28 ml/m  AORTIC VALVE LVOT Vmax:   86.70 cm/s LVOT Vmean:  63.300 cm/s LVOT VTI:    0.164 m  AORTA Ao Root diam: 2.70 cm MITRAL VALVE MV Area (PHT): 12.04 cm    SHUNTS MV Decel Time: 63 msec      Systemic VTI:  0.16 m MV E velocity: 98.50 cm/s   Systemic Diam: 1.80 cm MV A velocity: 147.00 cm/s MV E/A ratio:  0.67 Mihai Croitoru MD Electronically signed by Sanda Klein MD Signature Date/Time: 01/01/2020/1:41:21 PM    Final    US THORACENTESIS ASP PLEURAL SPACE W/IMG GUIDE  Result Date: 01/04/2020 INDICATION: Left pleural effusion Known Non small cell lung cancer EXAM: ULTRASOUND GUIDED LEFT THORACENTESIS MEDICATIONS: 10 cc 1% lidocaine. COMPLICATIONS: None immediate. PROCEDURE: An ultrasound guided thoracentesis was thoroughly discussed with the patient and questions answered. The benefits, risks, alternatives and complications were also discussed. The patient understands and wishes to proceed with the procedure. Written consent was obtained. Ultrasound was performed to localize and mark an adequate pocket of fluid in the left chest. The area was then prepped and draped in the normal sterile fashion. 1% Lidocaine was used for local anesthesia. Under ultrasound guidance a 19 G Yueh catheter was introduced. Thoracentesis was performed. The catheter was removed and a dressing applied. FINDINGS: A total of  approximately 850 cc of serosanguinous fluid was removed. Samples were sent to the laboratory as requested by the clinical team. IMPRESSION: Successful ultrasound guided left thoracentesis yielding 850 cc of pleural fluid. Postprocedural chest radiograph showed no pneumothorax. Read by Lavonia Drafts [facility] Electronically Signed   By: Van Clines M.D.   On: 01/04/2020 09:03   US THORACENTESIS ASP PLEURAL SPACE W/IMG GUIDE  Result Date: 12/30/2019 INDICATION: LEFT pleural effusion EXAM: ULTRASOUND GUIDED DIAGNOSTIC AND THERAPEUTIC LEFT THORACENTESIS MEDICATIONS: None. COMPLICATIONS: None immediate. PROCEDURE: An ultrasound guided thoracentesis was thoroughly discussed with the patient and questions answered. The benefits, risks, alternatives and complications were also discussed. The patient understands and wishes to proceed with the procedure. Written consent was obtained. Ultrasound was performed to localize and mark an adequate pocket of fluid in the LEFT chest. The area was then prepped and draped in the normal sterile fashion. 1% Lidocaine was used for local anesthesia. Under ultrasound guidance a 8 French thoracentesis catheter was introduced. Thoracentesis was performed. The catheter was removed and a dressing applied. FINDINGS: A total of approximately 1.08 L of cloudy yellow fluid was removed. Samples were sent to the laboratory as requested by the clinical team. IMPRESSION: Successful ultrasound guided LEFT thoracentesis yielding 1.08 L of pleural fluid. Electronically Signed   By: Lavonia Dana M.D.   On: 12/30/2019 17:42    Pathology  12/30/2019  Component 2 wk ago  CYTOLOGY - NON GYN CYTOLOGY - NON PAP   THIS IS AN ADDENDUM REPORT  CASE: [id number]  PATIENT: Sara Ferguson  Non-Gynecological Cytology Report     Clinical History: None provided  Specimen Submitted: A. PLEURAL FLUID, LEFT, THORACENTESIS:    FINAL MICROSCOPIC DIAGNOSIS:  - Malignant cells consistent with  non-small cell carcinoma  - See comment.   SPECIMEN ADEQUACY:  Satisfactory for evaluation  DIAGNOSTIC COMMENTS:  Immunohistochemistry will be performed and reported as an addendum.     Component 9 d ago  CYTOLOGY - NON GYN CYTOLOGY - NON PAP  CASE: [id number]  PATIENT: Sophi Olano  Non-Gynecological Cytology Report      Clinical History: None provided  Specimen Submitted: A. PLEURAL FLUID, LEFT, THORACENTESIS:    FINAL MICROSCOPIC DIAGNOSIS:  - Malignant cells consistent with metastatic adenocarcinoma   SPECIMEN ADEQUACY:  Satisfactory for evaluation      All questions were answered. The patient knows to call the clinic with any problems, questions or concerns. I spent a total of 60 minutes in the care of this patient, including H and P< review of outside records and imaging, counseling and coordination of care.     [name], MD 01/13/2020 10:18 AM

## 2020-01-13 NOTE — Progress Notes (Signed)
Per Dr. [name], I notified pathology to send molecular and PDL 1 testing to [facility].

## 2020-01-16 ENCOUNTER — Ambulatory Visit (HOSPITAL_COMMUNITY): Payer: Self-pay | Admitting: Hematology

## 2020-01-16 ENCOUNTER — Encounter (HOSPITAL_COMMUNITY): Payer: Self-pay

## 2020-01-16 ENCOUNTER — Encounter: Payer: Self-pay | Admitting: *Deleted

## 2020-01-16 NOTE — Progress Notes (Signed)
I followed up on Sara Ferguson's plan of care.  She needs MRI brain.  This scan has not been authorized with insurance, I reached out to revenue cycle team to help expedite authorization.

## 2020-01-20 ENCOUNTER — Encounter: Payer: Self-pay | Admitting: *Deleted

## 2020-01-20 NOTE — Progress Notes (Signed)
I followed up on Sara Ferguson's authorization for brain scan. This is completed and her scan is scheduled.  Patient has a follow up with Dr. [name] on 12/14.

## 2020-01-23 ENCOUNTER — Other Ambulatory Visit: Payer: Self-pay | Admitting: *Deleted

## 2020-01-23 ENCOUNTER — Other Ambulatory Visit: Payer: Self-pay | Admitting: Hematology and Oncology

## 2020-01-23 ENCOUNTER — Telehealth: Payer: Self-pay | Admitting: *Deleted

## 2020-01-23 DIAGNOSIS — J910 Malignant pleural effusion: Secondary | ICD-10-CM

## 2020-01-23 NOTE — Telephone Encounter (Signed)
Received call from patients niece Steffanie Dunn stating that they feel a repeat thoracentesis is warranted.  Per Dr. Rob Hickman last office note was instructed to call when it is needed.  Patient unable to wait until 02/01/2020.  Patient is experiencing worsening of shortness of breath with cough.  Not yet urgent to require ED visit.  Routed to Dr [name] to advise if ok to proceed with thoracentesis.

## 2020-01-23 NOTE — Progress Notes (Signed)
Ordered US thoracentesis to relieve malignant pleural effusion. Awaiting moleculars before starting treatment.

## 2020-01-24 ENCOUNTER — Encounter (HOSPITAL_COMMUNITY): Payer: Self-pay | Admitting: Hematology and Oncology

## 2020-01-25 ENCOUNTER — Encounter: Payer: Self-pay | Admitting: *Deleted

## 2020-01-25 ENCOUNTER — Inpatient Hospital Stay (HOSPITAL_COMMUNITY)
Admission: EM | Admit: 2020-01-25 | Discharge: 2020-01-28 | DRG: 180 | Disposition: A | Discharge status: Home / Self Care | Payer: Medicare Other | Attending: Internal Medicine | Admitting: Internal Medicine

## 2020-01-25 ENCOUNTER — Emergency Department (HOSPITAL_COMMUNITY): Payer: Medicare Other

## 2020-01-25 ENCOUNTER — Encounter (HOSPITAL_COMMUNITY): Payer: Self-pay | Admitting: Emergency Medicine

## 2020-01-25 DIAGNOSIS — C782 Secondary malignant neoplasm of pleura: Secondary | ICD-10-CM | POA: Diagnosis present

## 2020-01-25 DIAGNOSIS — E876 Hypokalemia: Secondary | ICD-10-CM | POA: Diagnosis present

## 2020-01-25 DIAGNOSIS — I313 Pericardial effusion (noninflammatory): Secondary | ICD-10-CM | POA: Diagnosis present

## 2020-01-25 DIAGNOSIS — R0600 Dyspnea, unspecified: Secondary | ICD-10-CM

## 2020-01-25 DIAGNOSIS — I5033 Acute on chronic diastolic (congestive) heart failure: Secondary | ICD-10-CM | POA: Diagnosis present

## 2020-01-25 DIAGNOSIS — E8770 Fluid overload, unspecified: Secondary | ICD-10-CM

## 2020-01-25 DIAGNOSIS — E119 Type 2 diabetes mellitus without complications: Secondary | ICD-10-CM

## 2020-01-25 DIAGNOSIS — I110 Hypertensive heart disease with heart failure: Secondary | ICD-10-CM | POA: Diagnosis present

## 2020-01-25 DIAGNOSIS — C3492 Malignant neoplasm of unspecified part of left bronchus or lung: Secondary | ICD-10-CM | POA: Diagnosis present

## 2020-01-25 DIAGNOSIS — Z79899 Other long term (current) drug therapy: Secondary | ICD-10-CM | POA: Diagnosis not present

## 2020-01-25 DIAGNOSIS — E1165 Type 2 diabetes mellitus with hyperglycemia: Secondary | ICD-10-CM | POA: Diagnosis present

## 2020-01-25 DIAGNOSIS — C7972 Secondary malignant neoplasm of left adrenal gland: Secondary | ICD-10-CM | POA: Diagnosis present

## 2020-01-25 DIAGNOSIS — I10 Essential (primary) hypertension: Secondary | ICD-10-CM | POA: Diagnosis present

## 2020-01-25 DIAGNOSIS — I160 Hypertensive urgency: Secondary | ICD-10-CM | POA: Diagnosis present

## 2020-01-25 DIAGNOSIS — Z91138 Patient's unintentional underdosing of medication regimen for other reason: Secondary | ICD-10-CM | POA: Diagnosis not present

## 2020-01-25 DIAGNOSIS — J910 Malignant pleural effusion: Secondary | ICD-10-CM | POA: Diagnosis present

## 2020-01-25 DIAGNOSIS — J90 Pleural effusion, not elsewhere classified: Secondary | ICD-10-CM | POA: Diagnosis present

## 2020-01-25 DIAGNOSIS — T501X6A Underdosing of loop [high-ceiling] diuretics, initial encounter: Secondary | ICD-10-CM | POA: Diagnosis present

## 2020-01-25 DIAGNOSIS — J9621 Acute and chronic respiratory failure with hypoxia: Secondary | ICD-10-CM | POA: Diagnosis present

## 2020-01-25 DIAGNOSIS — Z9889 Other specified postprocedural states: Secondary | ICD-10-CM | POA: Diagnosis present

## 2020-01-25 DIAGNOSIS — Z20822 Contact with and (suspected) exposure to covid-19: Secondary | ICD-10-CM | POA: Diagnosis present

## 2020-01-25 DIAGNOSIS — C771 Secondary and unspecified malignant neoplasm of intrathoracic lymph nodes: Secondary | ICD-10-CM | POA: Diagnosis present

## 2020-01-25 DIAGNOSIS — J9601 Acute respiratory failure with hypoxia: Secondary | ICD-10-CM

## 2020-01-25 DIAGNOSIS — J9602 Acute respiratory failure with hypercapnia: Secondary | ICD-10-CM | POA: Diagnosis present

## 2020-01-25 HISTORY — DX: Malignant neoplasm of unspecified part of unspecified bronchus or lung: C34.90

## 2020-01-25 LAB — CBC WITH DIFFERENTIAL/PLATELET
Abs Immature Granulocytes: 0.04 10*3/uL (ref 0.00–0.07)
Basophils Absolute: 0 10*3/uL (ref 0.0–0.1)
Basophils Relative: 0 %
Eosinophils Absolute: 0 10*3/uL (ref 0.0–0.5)
Eosinophils Relative: 0 %
HCT: 44.1 % (ref 36.0–46.0)
Hemoglobin: 14.2 g/dL (ref 12.0–15.0)
Immature Granulocytes: 1 %
Lymphocytes Relative: 12 %
Lymphs Abs: 0.9 10*3/uL (ref 0.7–4.0)
MCH: 28.6 pg (ref 26.0–34.0)
MCHC: 32.2 g/dL (ref 30.0–36.0)
MCV: 88.7 fL (ref 80.0–100.0)
Monocytes Absolute: 0.4 10*3/uL (ref 0.1–1.0)
Monocytes Relative: 5 %
Neutro Abs: 5.7 10*3/uL (ref 1.7–7.7)
Neutrophils Relative %: 82 %
Platelets: 275 10*3/uL (ref 150–400)
RBC: 4.97 MIL/uL (ref 3.87–5.11)
RDW: 13.1 % (ref 11.5–15.5)
WBC: 7 10*3/uL (ref 4.0–10.5)
nRBC: 0 % (ref 0.0–0.2)

## 2020-01-25 LAB — RESP PANEL BY RT-PCR (FLU A&B, COVID) ARPGX2
Influenza A by PCR: NEGATIVE
Influenza B by PCR: NEGATIVE
SARS Coronavirus 2 by RT PCR: NEGATIVE

## 2020-01-25 LAB — CBG MONITORING, ED: Glucose-Capillary: 184 mg/dL — ABNORMAL HIGH (ref 70–99)

## 2020-01-25 LAB — COMPREHENSIVE METABOLIC PANEL
ALT: 54 U/L — ABNORMAL HIGH (ref 0–44)
AST: 36 U/L (ref 15–41)
Albumin: 3.7 g/dL (ref 3.5–5.0)
Alkaline Phosphatase: 199 U/L — ABNORMAL HIGH (ref 38–126)
Anion gap: 11 (ref 5–15)
BUN: 11 mg/dL (ref 8–23)
CO2: 27 mmol/L (ref 22–32)
Calcium: 9.4 mg/dL (ref 8.9–10.3)
Chloride: 100 mmol/L (ref 98–111)
Creatinine, Ser: 0.58 mg/dL (ref 0.44–1.00)
GFR, Estimated: >60 mL/min (ref >60)
Glucose, Bld: 211 mg/dL — ABNORMAL HIGH (ref 70–99)
Potassium: 3.4 mmol/L — ABNORMAL LOW (ref 3.5–5.1)
Sodium: 138 mmol/L (ref 135–145)
Total Bilirubin: 1.3 mg/dL — ABNORMAL HIGH (ref 0.3–1.2)
Total Protein: 8.7 g/dL — ABNORMAL HIGH (ref 6.5–8.1)

## 2020-01-25 LAB — BRAIN NATRIURETIC PEPTIDE: B Natriuretic Peptide: 118.3 pg/mL — ABNORMAL HIGH (ref 0.0–100.0)

## 2020-01-25 LAB — TROPONIN I (HIGH SENSITIVITY)
Troponin I (High Sensitivity): 11 ng/L (ref <18)
Troponin I (High Sensitivity): 13 ng/L (ref <18)

## 2020-01-25 MED ORDER — ACETAMINOPHEN 650 MG RE SUPP: 650.0000 mg | Freq: Four times a day (QID) | RECTAL | Status: DC | PRN

## 2020-01-25 MED ORDER — ACETAMINOPHEN 325 MG PO TABS: 650.0000 mg | ORAL_TABLET | Freq: Four times a day (QID) | ORAL | Status: DC | PRN

## 2020-01-25 MED ORDER — ALBUTEROL SULFATE HFA 108 (90 BASE) MCG/ACT IN AERS
2.0000 | INHALATION_SPRAY | Freq: Four times a day (QID) | RESPIRATORY_TRACT | Status: DC | PRN
  Administered 2020-01-27: 2 via RESPIRATORY_TRACT
  Filled 2020-01-25: qty 6.7

## 2020-01-25 MED ORDER — MORPHINE SULFATE (PF) 2 MG/ML IV SOLN: 2.0000 mg | INTRAVENOUS | Status: DC | PRN

## 2020-01-25 MED ORDER — SODIUM CHLORIDE 0.9% FLUSH
3.0000 mL | Freq: Two times a day (BID) | INTRAVENOUS | Status: DC
  Administered 2020-01-26 – 2020-01-28 (×5): 3 mL via INTRAVENOUS

## 2020-01-25 MED ORDER — HYDROCODONE-ACETAMINOPHEN 5-325 MG PO TABS: 1.0000 | ORAL_TABLET | ORAL | Status: DC | PRN

## 2020-01-25 MED ORDER — HYDRALAZINE HCL 20 MG/ML IJ SOLN
10.0000 mg | Freq: Three times a day (TID) | INTRAMUSCULAR | Status: DC | PRN
  Administered 2020-01-26 – 2020-01-27 (×2): 10 mg via INTRAVENOUS
  Filled 2020-01-25 (×2): qty 1

## 2020-01-25 MED ORDER — ENOXAPARIN SODIUM 40 MG/0.4ML ~~LOC~~ SOLN
40.0000 mg | SUBCUTANEOUS | Status: DC
  Administered 2020-01-25 – 2020-01-27 (×3): 40 mg via SUBCUTANEOUS
  Filled 2020-01-25 (×3): qty 0.4

## 2020-01-25 MED ORDER — POTASSIUM CHLORIDE 10 MEQ/100ML IV SOLN
10.0000 meq | INTRAVENOUS | Status: AC
  Administered 2020-01-25 (×2): 10 meq via INTRAVENOUS
  Filled 2020-01-25: qty 100

## 2020-01-25 MED ORDER — AMLODIPINE BESYLATE 5 MG PO TABS
5.0000 mg | ORAL_TABLET | Freq: Every day | ORAL | Status: DC
  Administered 2020-01-26 – 2020-01-28 (×3): 5 mg via ORAL
  Filled 2020-01-25 (×3): qty 1

## 2020-01-25 MED ORDER — SODIUM CHLORIDE 0.9 % IV SOLN: INTRAVENOUS | Status: DC

## 2020-01-25 MED ORDER — POLYETHYLENE GLYCOL 3350 17 G PO PACK: 17.0000 g | PACK | Freq: Every day | ORAL | Status: DC | PRN

## 2020-01-25 MED ORDER — METOPROLOL TARTRATE 25 MG PO TABS
25.0000 mg | ORAL_TABLET | Freq: Two times a day (BID) | ORAL | Status: DC
  Administered 2020-01-25 – 2020-01-28 (×6): 25 mg via ORAL
  Filled 2020-01-25 (×6): qty 1

## 2020-01-25 MED ORDER — INSULIN GLARGINE 100 UNIT/ML ~~LOC~~ SOLN
5.0000 [IU] | Freq: Every day | SUBCUTANEOUS | Status: DC
  Administered 2020-01-25 – 2020-01-28 (×4): 5 [IU] via SUBCUTANEOUS
  Filled 2020-01-25 (×4): qty 0.05

## 2020-01-25 MED ORDER — FUROSEMIDE 10 MG/ML IJ SOLN
40.0000 mg | Freq: Once | INTRAMUSCULAR | Status: AC
  Administered 2020-01-25: 40 mg via INTRAVENOUS
  Filled 2020-01-25: qty 4

## 2020-01-25 NOTE — ED Notes (Signed)
Patient ambulated to restroom on RA. spo2 dropped to 84%. 2lpm [place] applied.

## 2020-01-25 NOTE — ED Notes (Signed)
Patient ambulatory to restroom with supervision

## 2020-01-25 NOTE — Consult Note (Signed)
NAME:  Sara Ferguson, MRN:  [id number], DOB:  [birth year], LOS: 0 ADMISSION DATE:  01/25/2020, CONSULTATION DATE:  12/8 REFERRING MD:  Dr. Neysa Bonito, CHIEF COMPLAINT:  SOB   Brief History   74 y/o F admitted with SOB and LE swelling.  Recent dx of metastatic non-small cell lung cancer by thoracentesis on 12/30/19.   History of present illness   74 y/o F who presented to [facility] ER on 12/8 with reports of increasing shortness of breath & LE swelling.   The patient was admitted from 11/12-11/18 for SOB and found to have a left pleural effusion and pericardial effusion.  She underwent a left thoracentesis on 11/12 at that time with cytology consistent with non-small cell lung cancer.  Fluid re-accumulated and she required a second thoracentesis on 11/17 with 851ml of fluid removed. Follow up CT post thoracentesis demonstrated residual left infrahilar mass-like density, grossly stable left axillary confluent mediastinal and hilar adenopathy consistent with metastatic disease, stable left adrenal nodule suspicious for metastasis, stable pericardial effusion.  Pleural cytology consistent with metastatic adenocarcinoma. 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  She was evaluated by Dr. [name] and felt it would be beneficial to proceed with molecular evaluation as the patient is a never smoker in the hopes of being able to treat with palliative immunotherapy.  Of note, her BP was very high at the [place] office visit.   She  returns to [facility] ER on 12/8 with reports of increasing shortness of breath and lower extremity swelling. She states she was on lasix at home but ran out.  Symptoms gradually worsened since discharge. The patient's niece called the Oncology office on 12/6 inquiring about a repeat thoracentesis due to shortness of breath. She ultimately presented to the ER with the same complaints.  Initial exam found her to be profoundly hypertensive.  Initial labs - Na 138, K 3.4, glucose 211, BUN 11 / Cr 0.58, alk phos 199, BNP 118, HS troponin 11, Hgb 7, Hgb 14.2, and platelets 275. CXR demonstrated recurrent accumulation of left effusion.     Past Medical History  DM  HTN Arthritis  Non-Small Cell [facility] Events   12/08 Admit with increased SOB, LE swelling   Consults:    Procedures:    Significant Diagnostic Tests:   Limited ECHO 12/8 >>   Micro Data:  COVID 12/8 >> negative  Influenza 12/8 >> negative   Antimicrobials:    Interim history/subjective:  Pt reports she started having symptoms gradually. She ran out of lasix and then legs started swelling.   Objective   Blood pressure (!) 225/94, pulse 99, temperature 98.1 F (36.7 C), temperature source Oral, resp. rate (!) 28, height $RemoveBe'5\' 4"'[id number]$  (1.626 m), weight 76.2 kg, SpO2 97 %.       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   -plan per ONC as outpatient -reviewed at bedside with [place], possible pleurX catheter placement 12/9 vs thora pending approval for bottles for home -will check with CM regarding insurance coverage for bottles for drainage as outpatient  -work toward improved BP control before proceeding with pleurX  -lasix per primary  -PRN albuterol  -follow intermittent CXR  Pericardial Effusion  Did not have FDG update on PET scan.   -given lower extremity swelling, consider enlarging effusion, assess limited ECHO   BLE Swelling  -assess LE for DVT in setting of known malignancy   Hypertensive Urgency  -per primary   Best practice (evaluated daily)  Diet: as tolerated  Pain/Anxiety/Delirium protocol (if indicated): n/a  VAP protocol (if indicated): n/a  DVT prophylaxis: per primary  GI prophylaxis: n/a  Glucose control: per primary  Mobility: as tolerated Last date of multidisciplinary goals of care discussion: per primary  Family and staff present: n/a  Summary of discussion: n/a  Follow up goals of care discussion due: 12/15  Code Status: Full Code, consider early palliative involvement  Disposition: Per [facility]  Labs   CBC: Recent Labs  Lab 01/25/20 1158  WBC 7.0  NEUTROABS 5.7  HGB 14.2  HCT 44.1  MCV 88.7  PLT 536    Basic Metabolic Panel: Recent Labs  Lab 01/25/20 1158  NA 138  K 3.4*  CL 100  CO2 27  GLUCOSE 211*  BUN 11  CREATININE 0.58  CALCIUM 9.4   GFR: Estimated Creatinine Clearance: 59.8 mL/min (by C-G formula based on SCr of 0.58 mg/dL). Recent Labs  Lab 01/25/20 1158  WBC 7.0    Liver Function Tests: Recent Labs  Lab 01/25/20 1158  AST 36  ALT 54*  ALKPHOS 199*  BILITOT 1.3*  PROT 8.7*  ALBUMIN 3.7   No results for input(s): LIPASE, AMYLASE in the last 168 hours. No results for input(s): AMMONIA in the last 168 hours.  ABG    Component  Value Date/Time   HCO3 25.5 12/30/2019 1118   O2SAT 49.1 12/30/2019 1118     Coagulation Profile: No results for input(s): INR, PROTIME in the last 168 hours.  Cardiac Enzymes: No results for input(s): CKTOTAL, CKMB, CKMBINDEX, TROPONINI in the last 168 hours.  HbA1C: Hgb A1c MFr Bld  Date/Time Value Ref Range Status  12/30/2019 11:18 AM 10.7 (H) 4.8 - 5.6 % Final    Comment:    (NOTE) Pre diabetes:          5.7%-6.4%  Diabetes:              >6.4%  Glycemic control for   <7.0% adults with diabetes     CBG: No results for input(s): GLUCAP in the last 168 hours.  Review of Systems: Positives in [place]  Gen: Denies fever, chills, weight change, fatigue, night sweats HEENT: Denies blurred vision, double vision, hearing loss, tinnitus, sinus congestion, rhinorrhea, sore throat, neck stiffness, dysphagia PULM: Denies shortness of breath, cough, sputum production, hemoptysis, wheezing CV: Denies chest pain, LE  edema, orthopnea, paroxysmal nocturnal dyspnea, palpitations GI: Denies abdominal pain, nausea, vomiting, diarrhea, hematochezia, melena, constipation, change in bowel habits GU: Denies dysuria, hematuria, polyuria, oliguria, urethral discharge Endocrine: Denies hot or cold intolerance, polyuria, polyphagia or appetite change Derm: Denies rash, dry skin, scaling or peeling skin change Heme: Denies easy bruising, bleeding, bleeding gums Neuro: Denies headache, numbness, weakness, slurred speech, loss of memory or consciousness   Past Medical History  She,  has a past medical history of Arthritis, Diabetes mellitus without complication ([place]), Hypertension, and Lung cancer ([place]) (12/2019).   Surgical History    Past Surgical History:  Procedure Laterality Date  . ABDOMINAL HYSTERECTOMY    . ECTOPIC PREGNANCY SURGERY       Social History   reports that she has never smoked. She has never used smokeless tobacco. She reports previous alcohol use. She reports that she does  not use drugs.   Family History   Her family history is not on file.   Allergies No Known Allergies   Home Medications  Prior to Admission medications   Medication Sig Start Date End Date Taking? Authorizing Provider  albuterol (VENTOLIN HFA) 108 (90 Base) MCG/ACT inhaler Inhale 2 puffs into the lungs every 6 (six) hours as needed for wheezing or shortness of breath. 01/05/20 02/04/20  Antonieta Pert, MD  amLODipine (NORVASC) 5 MG tablet Take 1 tablet (5 mg total) by mouth daily. 01/05/20 03/05/20  Antonieta Pert, MD  furosemide (LASIX) 20 MG tablet Take 1 tablet (20 mg total) by mouth daily for 14 days. 01/05/20 01/19/20  Antonieta Pert, MD  metoprolol tartrate (LOPRESSOR) 25 MG tablet Take 1 tablet (25 mg total) by mouth 2 (two) times daily. 01/05/20 03/05/20  Antonieta Pert, MD     Critical care time: n/a    Noe Gens, MSN, NP-C, AGACNP-BC [facility] 01/25/2020, 4:26 PM   Please see [email or web address] for pager details.

## 2020-01-25 NOTE — ED Provider Notes (Signed)
[facility] DEPT Provider Note   CSN: [id number] Arrival date & time: 01/25/20  1113     History Chief Complaint  Patient presents with  . Shortness of Breath    Sara Ferguson is a 74 y.o. female.  74 year old female with history of fluid retention presents with dyspnea on exertion times several days.  Also endorses orthopnea.  Has had some chest tightness but no anginal type chest pain.  Nonproductive cough without fever or chills.  She has not been vaccinated against Covid.  Symptoms have been progressively worse and unresponsive to her home Lasix.        Past Medical History:  Diagnosis Date  . Arthritis   . Diabetes mellitus without complication ([place])   . Hypertension   . Lung cancer ([place]) 12/2019    Patient Active Problem List   Diagnosis Date Noted  . Adenocarcinoma of left lung, stage 4 ([place]) 01/13/2020  . Goals of care, counseling/discussion 01/13/2020  . Pleural effusion, left 12/31/2019  . Type 2 diabetes mellitus without complication ([place]) 25/42/7062  . Acute respiratory failure with hypoxia ([place]) 12/31/2019  . Pericardial effusion 12/31/2019  . Essential hypertension 12/30/2019  . Pleural effusion, malignant 12/30/2019  . Pleural effusion 12/30/2019    Past Surgical History:  Procedure Laterality Date  . ABDOMINAL HYSTERECTOMY    . ECTOPIC PREGNANCY SURGERY       OB History   No obstetric history on file.     History reviewed. No pertinent family history.  Social History   Tobacco Use  . Smoking status: Never Smoker  . Smokeless tobacco: Never Used  Substance Use Topics  . Alcohol use: Not Currently  . Drug use: Never    Home Medications Prior to Admission medications   Medication Sig Start Date End Date Taking? Authorizing Provider  albuterol (VENTOLIN HFA) 108 (90 Base) MCG/ACT inhaler Inhale 2 puffs into the lungs every 6 (six) hours as needed for wheezing or shortness of breath. 01/05/20 02/04/20  Antonieta Pert, MD  amLODipine (NORVASC) 5 MG tablet Take 1 tablet (5 mg total) by mouth daily. 01/05/20 03/05/20  Antonieta Pert, MD  furosemide (LASIX) 20 MG tablet Take 1 tablet (20 mg total) by mouth daily for 14 days. 01/05/20 01/19/20  Antonieta Pert, MD  metoprolol tartrate (LOPRESSOR) 25 MG tablet Take 1 tablet (25 mg total) by mouth 2 (two) times daily. 01/05/20 03/05/20  Antonieta Pert, MD    Allergies    Patient has no known allergies.  Review of Systems   Review of Systems  All other systems reviewed and are negative.   Physical Exam Updated Vital Signs BP (!) 189/101 (BP Location: Left Arm)   Pulse 92   Temp 98.1 F (36.7 C) (Oral)   Resp (!) 25   Ht 1.626 m (5\' 4" )   Wt 76.2 kg   SpO2 90%   BMI 28.84 kg/m   Physical Exam Vitals and nursing note reviewed.  Constitutional:      General: She is not in acute distress.    Appearance: Normal appearance. She is well-developed. She is not toxic-appearing.  HENT:     Head: Normocephalic and atraumatic.  Eyes:     General: Lids are normal.     Conjunctiva/sclera: Conjunctivae normal.     Pupils: Pupils are equal, round, and reactive to light.  Neck:     Thyroid: No thyroid mass.     Trachea: No tracheal deviation.  Cardiovascular:     Rate and  Rhythm: Normal rate and regular rhythm.     Heart sounds: Normal heart sounds. No murmur heard.  No gallop.   Pulmonary:     Effort: Pulmonary effort is normal. No respiratory distress.     Breath sounds: No stridor. Examination of the left-upper field reveals decreased breath sounds and rhonchi. Examination of the left-middle field reveals decreased breath sounds and rhonchi. Examination of the left-lower field reveals decreased breath sounds and rhonchi. Decreased breath sounds and rhonchi present. No wheezing or rales.  Abdominal:     General: Bowel sounds are normal. There is no distension.     Palpations: Abdomen is soft.     Tenderness: There is no abdominal tenderness. There is no  rebound.  Musculoskeletal:        General: No tenderness. Normal range of motion.     Cervical back: Normal range of motion and neck supple.     Comments: Bilateral le edema 2 plus  Skin:    General: Skin is warm and dry.     Findings: No abrasion or rash.  Neurological:     Mental Status: She is alert and oriented to person, place, and time.     GCS: GCS eye subscore is 4. GCS verbal subscore is 5. GCS motor subscore is 6.     Cranial Nerves: No cranial nerve deficit.     Sensory: No sensory deficit.  Psychiatric:        Speech: Speech normal.        Behavior: Behavior normal.     ED Results / Procedures / Treatments   Labs (all labs ordered are listed, but only abnormal results are displayed) Labs Reviewed  RESP PANEL BY RT-PCR (FLU A&B, COVID) ARPGX2  BRAIN NATRIURETIC PEPTIDE  CBC WITH DIFFERENTIAL/PLATELET  COMPREHENSIVE METABOLIC PANEL  TROPONIN I (HIGH SENSITIVITY)    EKG EKG Interpretation  Date/Time:  Wednesday January 25 2020 11:22:48 EST Ventricular Rate:  92 PR Interval:    QRS Duration: 76 QT Interval:  383 QTC Calculation: 474 R Axis:   34 Text Interpretation: Sinus rhythm 12 Lead; [place] Confirmed by Lacretia Leigh ([id number]) on 01/25/2020 11:47:34 AM   Radiology DG Chest 2 View  Result Date: 01/25/2020 CLINICAL DATA:  Shortness of breath EXAM: CHEST - 2 VIEW COMPARISON:  01/04/2020 FINDINGS: Cardiomegaly. Near complete opacification of the left hemithorax, likely a combination of effusion and airspace disease. Only and small amount of aerated left upper lobe noted which appears to contain airspace disease. Right lung clear. No acute bony abnormality. IMPRESSION: Near complete opacification of the left hemithorax, likely a combination of pleural effusion and airspace disease. Cardiomegaly Electronically Signed   By: Rolm Baptise M.D.   On: 01/25/2020 11:52    Procedures Procedures (including critical care time)  Medications Ordered in  ED Medications  0.9 %  sodium chloride infusion (has no administration in time range)    ED Course  I have reviewed the triage vital signs and the nursing notes.  Pertinent labs & imaging results that were available during my care of the patient were reviewed by me and considered in my medical decision making (see chart for details).    MDM Rules/Calculators/A&P                          Pt with large left sided effusion and sob Will require admission D/w triad who will admit Final Clinical Impression(s) / ED Diagnoses Final diagnoses:  None  Rx / DC Orders ED Discharge Orders    None       Lacretia Leigh, MD 01/25/20 1352

## 2020-01-25 NOTE — TOC Initial Note (Addendum)
Transition of Care [facility]) - Initial/Assessment Note    Patient Details  Name: Sara Ferguson MRN: [id number] Date of Birth: Jun 02, 1945  [facility]) CM/SW Contact:    Erenest Rasher, RN Phone Number: [phone] 01/25/2020, 6:25 PM  Clinical Narrative:                  TOC CM spoke to pt's niece. She is currently living with niece, [name]. Contacted [employer] and they cannot give info on benefits until they run insurance coverage. Contact [employer] and they do not accept insurance. Gave number for [facility] [phone] and [facility] [phone]. They do have patient assistance for pleurx bottles. Niece says that her Medicaid should be approved in 1-2 weeks. Will contact [facility] and [facility] to follow up on patient assistance programs to assist with pleurx supplies. Pt does not have DME or HH in the past.    Expected Discharge Plan: [place] Barriers to Discharge: Continued Medical Work up   Patient Goals and CMS Choice Patient states their goals for this hospitalization and ongoing recovery are:: prefers patient dc home CMS Medicare.gov Compare Post Acute Care list provided to:: Patient Represenative (must comment) Choice offered to / list presented to : Adult Children  Expected Discharge Plan and Services Expected Discharge Plan: [place] In-house Referral: Clinical Social Work, Development worker, community Discharge Planning Services: CM Consult Post Acute Care Choice: [place] arrangements for the past 2 months: [place]                                      Prior Living Arrangements/Services Living arrangements for the past 2 months: Single Family Home Lives with:: Adult Children Patient language and need for interpreter reviewed:: Yes Do you feel safe going back to the place where you live?: Yes      Need for Family Participation in Patient Care: Yes  (Comment) Care giver support system in place?: Yes (comment)   Criminal Activity/Legal Involvement Pertinent to Current Situation/Hospitalization: No - Comment as needed  Activities of Daily Living Home Assistive Devices/Equipment: Eyeglasses (reading glasses) ADL Screening (condition at time of admission) Patient's cognitive ability adequate to safely complete daily activities?: Yes Is the patient deaf or have difficulty hearing?: No Does the patient have difficulty seeing, even when wearing glasses/contacts?: No Does the patient have difficulty concentrating, remembering, or making decisions?: No Patient able to express need for assistance with ADLs?: Yes Does the patient have difficulty dressing or bathing?: No Independently performs ADLs?: Yes (appropriate for developmental age) Does the patient have difficulty walking or climbing stairs?: Yes Weakness of Legs: Both Weakness of Arms/Hands: None  Permission Sought/Granted Permission sought to share information with : Case Manager, Customer service manager, PCP, Family Supports Permission granted to share information with : Yes, Verbal Permission Granted  Share Information with NAME: Nayomi Tabron  Permission granted to share info w AGENCY: [place] granted to share info w Relationship: niece  Permission granted to share info w Contact Information: [phone]  Emotional Assessment       Orientation: : Oriented to Self, Oriented to Place, Oriented to  Time, Oriented to Situation   Psych Involvement: No (comment)  Admission diagnosis:  Acute respiratory failure with hypoxia ([place]) [J96.01] Patient Active Problem List   Diagnosis Date Noted  . Hypokalemia 01/25/2020  .  Volume overload 01/25/2020  . Adenocarcinoma of left lung, stage 4 ([place]) 01/13/2020  . Goals of care, counseling/discussion 01/13/2020  . Pleural effusion, left 12/31/2019  . Type 2 diabetes mellitus without complication ([place])  59/97/7414  . Acute respiratory failure with hypoxia ([place]) 12/31/2019  . Pericardial effusion 12/31/2019  . Essential hypertension 12/30/2019  . Pleural effusion, malignant 12/30/2019  . Pleural effusion 12/30/2019   PCP:  [name], MD Pharmacy:   [id number] - [place], [place] [address] [place] [address] Phone: [phone] Fax: [phone]     Social Determinants of Health (SDOH) Interventions    Readmission Risk Interventions No flowsheet data found.

## 2020-01-25 NOTE — H&P (Signed)
History and Physical        Hospital Admission Note Date: 01/25/2020  Patient name: Sara Ferguson Medical record number: [id number] Date of birth: 1945/04/09 Age: 74 y.o. Gender: female  PCP: [name], MD  Patient coming from: home Lives with: alone At baseline, ambulates: independently  Chief Complaint    Chief Complaint  Patient presents with  . Shortness of Breath      HPI:   This is a 74 year old female with past medical history of hypertension, diabetes, adenocarcinoma of the lung, recurrent malignant pleural effusion, HFrEF (last EF 40 to 45% in November) who presented to the ED with worsening shortness of breath over the past several days.  Admits to orthopnea.  Shortness of breath worse on exertion.  Also with nonproductive cough without fever or chills.  No chest pain, nausea, vomiting.  She was recently seen on 12/6 by her oncologist, Dr. [name], who had ordered a US guided thoracentesis for her pleural effusion although her symptoms have become much worse prompting her to come to the ED.  Has also had bilateral lower extremity swelling without improvement with her home Lasix  ED Course: Afebrile, hypertensive, hypoxic (SpO2 87%) placed on 2 L/min. Notable Labs: K3.4, glucose 211, alkaline phosphatase 199, AST 36, ALT 54, total protein 8.7, T bili 1.3, BNP 118, troponin 11, WBC 7.0, Hb 14.2. Notable Imaging: CXR with near complete opacification of left hemithorax likely a combination of pleural effusion and airspace disease and cardiomegaly.     Vitals:   01/25/20 1338 01/25/20 1420  BP:  (!) 225/94  Pulse: 98 99  Resp: (!) 25 (!) 28  Temp:    SpO2: 92% 97%     Review of Systems:  Review of Systems  All other systems reviewed and are negative.    Medical/Social/Family History   Past Medical History: Past Medical History:  Diagnosis Date  .  Arthritis   . Diabetes mellitus without complication ([place])   . Hypertension   . Non-small cell lung cancer ([place])    Dx 12/30/19 by thoracentesis     Past Surgical History:  Procedure Laterality Date  . ABDOMINAL HYSTERECTOMY    . ECTOPIC PREGNANCY SURGERY      Medications: Prior to Admission medications   Medication Sig Start Date End Date Taking? Authorizing Provider  albuterol (VENTOLIN HFA) 108 (90 Base) MCG/ACT inhaler Inhale 2 puffs into the lungs every 6 (six) hours as needed for wheezing or shortness of breath. 01/05/20 02/04/20  Antonieta Pert, MD  amLODipine (NORVASC) 5 MG tablet Take 1 tablet (5 mg total) by mouth daily. 01/05/20 03/05/20  Antonieta Pert, MD  furosemide (LASIX) 20 MG tablet Take 1 tablet (20 mg total) by mouth daily for 14 days. 01/05/20 01/19/20  Antonieta Pert, MD  metoprolol tartrate (LOPRESSOR) 25 MG tablet Take 1 tablet (25 mg total) by mouth 2 (two) times daily. 01/05/20 03/05/20  Antonieta Pert, MD    Allergies:  No Known Allergies  Social History:  reports that she has never smoked. She has never used smokeless tobacco. She reports previous alcohol use. She reports that she does not use drugs.  Family History: History reviewed. No pertinent family history.   Objective  Physical Exam: Blood pressure (!) 225/94, pulse 99, temperature 98.1 F (36.7 C), temperature source Oral, resp. rate (!) 28, height 5\' 4"  (1.626 m), weight 76.2 kg, SpO2 97 %.  Physical Exam Vitals and nursing note reviewed.  Constitutional:      General: She is not in acute distress.    Appearance: Normal appearance.  HENT:     Head: Normocephalic and atraumatic.  Eyes:     Conjunctiva/sclera: Conjunctivae normal.  Cardiovascular:     Rate and Rhythm: Normal rate and regular rhythm.  Pulmonary:     Breath sounds: Examination of the left-upper field reveals decreased breath sounds. Examination of the left-middle field reveals decreased breath sounds. Examination of the left-lower  field reveals decreased breath sounds. Decreased breath sounds present.     Comments: Conversational dyspnea Abdominal:     General: Abdomen is flat.     Palpations: Abdomen is soft.  Musculoskeletal:        General: No swelling.     Right lower leg: No tenderness. Edema present.     Left lower leg: No tenderness. Edema present.  Skin:    Coloration: Skin is not jaundiced or pale.  Neurological:     General: No focal deficit present.     Mental Status: She is alert. Mental status is at baseline.  Psychiatric:        Mood and Affect: Mood normal.        Behavior: Behavior normal.     LABS on Admission: I have personally reviewed all the labs and imaging below    Basic Metabolic Panel: Recent Labs  Lab 01/25/20 1158  NA 138  K 3.4*  CL 100  CO2 27  GLUCOSE 211*  BUN 11  CREATININE 0.58  CALCIUM 9.4   Liver Function Tests: Recent Labs  Lab 01/25/20 1158  AST 36  ALT 54*  ALKPHOS 199*  BILITOT 1.3*  PROT 8.7*  ALBUMIN 3.7   No results for input(s): LIPASE, AMYLASE in the last 168 hours. No results for input(s): AMMONIA in the last 168 hours. CBC: Recent Labs  Lab 01/25/20 1158  WBC 7.0  NEUTROABS 5.7  HGB 14.2  HCT 44.1  MCV 88.7  PLT 275   Cardiac Enzymes: No results for input(s): CKTOTAL, CKMB, CKMBINDEX, TROPONINI in the last 168 hours. BNP: Invalid input(s): POCBNP CBG: No results for input(s): GLUCAP in the last 168 hours.  Radiological Exams on Admission:  DG Chest 2 View  Result Date: 01/25/2020 CLINICAL DATA:  Shortness of breath EXAM: CHEST - 2 VIEW COMPARISON:  01/04/2020 FINDINGS: Cardiomegaly. Near complete opacification of the left hemithorax, likely a combination of effusion and airspace disease. Only and small amount of aerated left upper lobe noted which appears to contain airspace disease. Right lung clear. No acute bony abnormality. IMPRESSION: Near complete opacification of the left hemithorax, likely a combination of pleural  effusion and airspace disease. Cardiomegaly Electronically Signed   By: Rolm Baptise M.D.   On: 01/25/2020 11:52      EKG: Independently reviewed   A & P   Principal Problem:   Acute respiratory failure with hypoxia (HCC) Active Problems:   Essential hypertension   Pleural effusion, left   Type 2 diabetes mellitus without complication (HCC)   Adenocarcinoma of left lung, stage 4 (H[facility])   Hypokalemia   Volume overload   1. Acute hypoxic respiratory failure secondary to recurrent left-sided pleural effusion a. O2 87% on RA, currently tolerating 2 LPM and typically on  room air b. History of malignant effusion back in November with cytology positive for non-small cell carcinoma c. CXR with near complete opacification of left hemithorax likely combination of left pleural effusion and airspace disease d. PCCM consulted for suspected thoracentesis, discussed with Dr. Carlis Abbott  2. Possible mild systolic heart failure exacerbation a. Echo 01/01/2020-EF 40 to 45% with mildly decreased LV function and global hypokinesis and moderate concentric LVH.  Small circumferential pericardial effusion without evidence of tamponade b. will give a dose of IV Lasix c. Trend intake/output and daily weights  3. Stage IV adenocarcinoma of the lung a. Follows with Dr. [name] b. Plan is for palliative chemo c. Currently patient is unsure if she wants DNR or full code.  Will make full code for now.  Recommend readdressing in a.m.  4. Hypertensive urgency a. Likely from not getting her a.m. meds b. Restart home meds c. Add on as needed hydralazine  5. Poorly controlled diabetes a. Currently n.p.o. for possible procedure b. will add on Lantus  6. Hypokalemia a. Replete    DVT prophylaxis: Lovenox   Code Status: Full Code  Diet: N.p.o. Family Communication: Admission, patients condition and plan of care including tests being ordered have been discussed with the patient who indicates understanding and  agrees with the plan and Code Status. Disposition Plan: The appropriate patient status for this patient is INPATIENT. Inpatient status is judged to be reasonable and necessary in order to provide the required intensity of service to ensure the patient's safety. The patient's presenting symptoms, physical exam findings, and initial radiographic and laboratory data in the context of their chronic comorbidities is felt to place them at high risk for further clinical deterioration. Furthermore, it is not anticipated that the patient will be medically stable for discharge from the hospital within 2 midnights of admission. The following factors support the patient status of inpatient.   " The patient's presenting symptoms include shortness of breath. " The worrisome physical exam findings include hypoxia, and conversational dyspnea dyspnea. " The initial radiographic and laboratory data are worrisome because of recurrent left pleural effusion. " The chronic co-morbidities include malignancy, poorly controlled diabetes, hypertension.   * I certify that at the point of admission it is my clinical judgment that the patient will require inpatient hospital care spanning beyond 2 midnights from the point of admission due to high intensity of service, high risk for further deterioration and high frequency of surveillance required.*   Status is: Inpatient  Remains inpatient appropriate because:Ongoing diagnostic testing needed not appropriate for outpatient work up, IV treatments appropriate due to intensity of illness or inability to take PO and Inpatient level of care appropriate due to severity of illness   Dispo: The patient is from: Home              Anticipated d/c is to: Home              Anticipated d/c date is: 2 days              Patient currently is not medically stable to d/c.         The medical decision making on this patient was of high complexity and the patient is at high risk for  clinical deterioration, therefore this is a level 3 admission.  Consultants  . PCCM  Procedures  . None  Time Spent on Admission: 70 minutes    Harold Hedge, DO [facility]  01/25/2020, 3:17 PM

## 2020-01-25 NOTE — ED Notes (Signed)
Pt ambulatory to restroom

## 2020-01-25 NOTE — ED Triage Notes (Addendum)
Pt states that she was seen about a month ago for SOB and fluid retention and dx with lung CA. States she now has the same again along with lower leg and feet swelling. Has not started treatment yet. Alert and oriented. SOB on exertion.

## 2020-01-25 NOTE — Progress Notes (Signed)
I received message from [facility] asking about molecular test results.  I followed up on [facility] patient portal and Ms. Probus's molecular test and PDL 1.  The PDL 1 results are completed but the molecular tests are pending and will be resulted out on 01/30/20.  I notified Dr. Rob Hickman nurse Eustaquio Maize.

## 2020-01-26 ENCOUNTER — Inpatient Hospital Stay (HOSPITAL_COMMUNITY): Payer: Medicare Other

## 2020-01-26 ENCOUNTER — Telehealth: Payer: Self-pay | Admitting: *Deleted

## 2020-01-26 ENCOUNTER — Encounter (HOSPITAL_COMMUNITY)
Admission: EM | Disposition: A | Discharge status: Home / Self Care | Payer: Self-pay | Source: Home / Self Care | Attending: Internal Medicine

## 2020-01-26 ENCOUNTER — Other Ambulatory Visit: Payer: Self-pay

## 2020-01-26 ENCOUNTER — Other Ambulatory Visit: Payer: Self-pay | Admitting: *Deleted

## 2020-01-26 DIAGNOSIS — M7989 Other specified soft tissue disorders: Secondary | ICD-10-CM

## 2020-01-26 DIAGNOSIS — R0600 Dyspnea, unspecified: Secondary | ICD-10-CM

## 2020-01-26 DIAGNOSIS — J910 Malignant pleural effusion: Secondary | ICD-10-CM

## 2020-01-26 DIAGNOSIS — R52 Pain, unspecified: Secondary | ICD-10-CM

## 2020-01-26 DIAGNOSIS — E119 Type 2 diabetes mellitus without complications: Secondary | ICD-10-CM

## 2020-01-26 DIAGNOSIS — E8770 Fluid overload, unspecified: Secondary | ICD-10-CM

## 2020-01-26 DIAGNOSIS — I313 Pericardial effusion (noninflammatory): Secondary | ICD-10-CM

## 2020-01-26 DIAGNOSIS — J90 Pleural effusion, not elsewhere classified: Secondary | ICD-10-CM

## 2020-01-26 LAB — ECHOCARDIOGRAM LIMITED
Height: 64 in
S' Lateral: 2.1 cm
Weight: 2688 oz

## 2020-01-26 LAB — CBC
HCT: 42.9 % (ref 36.0–46.0)
Hemoglobin: 14 g/dL (ref 12.0–15.0)
MCH: 28.8 pg (ref 26.0–34.0)
MCHC: 32.6 g/dL (ref 30.0–36.0)
MCV: 88.3 fL (ref 80.0–100.0)
Platelets: 238 10*3/uL (ref 150–400)
RBC: 4.86 MIL/uL (ref 3.87–5.11)
RDW: 13 % (ref 11.5–15.5)
WBC: 6.4 10*3/uL (ref 4.0–10.5)
nRBC: 0 % (ref 0.0–0.2)

## 2020-01-26 LAB — BASIC METABOLIC PANEL
Anion gap: 12 (ref 5–15)
BUN: 12 mg/dL (ref 8–23)
CO2: 23 mmol/L (ref 22–32)
Calcium: 9.1 mg/dL (ref 8.9–10.3)
Chloride: 100 mmol/L (ref 98–111)
Creatinine, Ser: 0.62 mg/dL (ref 0.44–1.00)
GFR, Estimated: >60 mL/min (ref >60)
Glucose, Bld: 211 mg/dL — ABNORMAL HIGH (ref 70–99)
Potassium: 3.4 mmol/L — ABNORMAL LOW (ref 3.5–5.1)
Sodium: 135 mmol/L (ref 135–145)

## 2020-01-26 LAB — GLUCOSE, CAPILLARY
Glucose-Capillary: 295 mg/dL — ABNORMAL HIGH (ref 70–99)
Glucose-Capillary: 280 mg/dL — ABNORMAL HIGH (ref 70–99)

## 2020-01-26 LAB — MAGNESIUM: Magnesium: 1.8 mg/dL (ref 1.7–2.4)

## 2020-01-26 SURGERY — INSERTION, PLEURAL DRAINAGE CATHETER
Anesthesia: LOCAL | Laterality: Left

## 2020-01-26 MED ORDER — POTASSIUM CHLORIDE 20 MEQ PO PACK
40.0000 meq | PACK | Freq: Once | ORAL | Status: AC
  Administered 2020-01-26: 40 meq via ORAL
  Filled 2020-01-26: qty 2

## 2020-01-26 MED ORDER — LABETALOL HCL 5 MG/ML IV SOLN
10.0000 mg | Freq: Once | INTRAVENOUS | Status: AC
  Administered 2020-01-26: 10 mg via INTRAVENOUS
  Filled 2020-01-26: qty 4

## 2020-01-26 NOTE — Progress Notes (Signed)
Follow up on Pleurx bottles. TOC CM contacted [facility] [phone], they are unable to assist through pt assistance program due to Medicare. They only provide pt with Medicaid and uninsured. Contacted [facility] and they do not bill insurance. [place], [place] ED TOC CM [phone]

## 2020-01-26 NOTE — ED Notes (Signed)
Pt wheeled to restroom by this Lexicographer. Pt washing up herself and getting dressed in new gown. Linens changed and room straightened up.

## 2020-01-26 NOTE — Progress Notes (Signed)
NAME:  Sara Ferguson, MRN:  [id number], DOB:  1945/04/09, LOS: 1 ADMISSION DATE:  01/25/2020, CONSULTATION DATE:  12/8 REFERRING MD:  Dr. Neysa Bonito, CHIEF COMPLAINT:  SOB   Brief History   74 y/o F admitted with SOB and LE swelling.  Recent dx of metastatic non-small cell lung cancer by thoracentesis on 12/30/19.   History of present illness   74 y/o F who presented to [facility] ER on 12/8 with reports of increasing shortness of breath & LE swelling.   The patient was admitted from 11/12-11/18 for SOB and found to have a left pleural effusion and pericardial effusion.  She underwent a left thoracentesis on 11/12 at that time with cytology consistent with non-small cell lung cancer.  Fluid re-accumulated and she required a second thoracentesis on 11/17 with 857m of fluid removed. Follow up CT post thoracentesis demonstrated residual left infrahilar mass-like density, grossly stable left axillary confluent mediastinal and hilar adenopathy consistent with metastatic disease, stable left adrenal nodule suspicious for metastasis, stable pericardial effusion.  Pleural cytology consistent with metastatic adenocarcinoma. 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  She was evaluated by Dr. IChryl Heckand felt it would be beneficial to proceed with molecular evaluation as the patient is a never smoker in the hopes of being able to treat with palliative immunotherapy.  Of note, her BP was very high at the [place].   [facility] reports of increasing shortness of breath and lower extremity swelling. She states she was on lasix at home but ran out.  Symptoms gradually worsened since discharge. The patient's niece called the Oncology office on 12/6 inquiring about a repeat thoracentesis due to shortness of breath. She ultimately presented to the ER with the same complaints.  Initial exam found her to be profoundly hypertensive.  Initial labs - Na 138, K 3.4, glucose 211, BUN 11 / Cr 0.58, alk phos 199, BNP 118, HS troponin 11, Hgb 7, Hgb 14.2, and platelets 275. CXR demonstrated recurrent accumulation of left effusion.     Past Medical History  DM  HTN Arthritis  [facility]   12/08 Admit with increased SOB, LE swelling   Consults:    Procedures:    Significant Diagnostic Tests:   Limited ECHO 12/8 >>   Micro Data:  COVID 12/8 >> negative  Influenza 12/8 >> negative   Antimicrobials:    Interim history/subjective:  Today SOB is stable. She denies new complaints.  Objective   Blood pressure (!) 201/115, pulse 95, temperature 97.7 F (36.5 C), temperature source Oral, resp. rate 20, height _0  (1.626 m), weight 76.2 kg, SpO2 99 %.        Intake/Output Summary (Last 24 hours) at 01/26/2020 1328 Last data filed at 01/25/2020 2107 Gross per 24 hour  Intake 289.83 ml  Output 1000 ml  Net -710.17 ml   Filed Weights   01/25/20 1125  Weight: 76.2 kg    Examination: General: elderly woman sitting up in bed in NAD HEENT: [place]/AT, eyes anicteric  Neuro: awake, answering questions appropriately  CV: S1S2,  PULM: tachypnea, sitting more upright today, still speaking in fairly normal sentences. On [place]. GI: soft, nondistended Extremities: persistent peripheral edema, no cyanosis  Skin: no rashes or wounds  Resolved Hospital Problem list     Assessment & Plan:   Dyspnea- multifactorial due to pericardial and pleural  effusions Metastatic Non-Small Cell Carcinoma of the Lung Metastatic Loculated Left Pleural Effusion   Mets to pleural and pericardial space, adrenal gland on left.   -Discussed plans with Dr. [name], hopefully going to be able to start treatment soon. -Thoracentesis today; given her non-smoking status and location of disease, she is felt to be highly likely to be a candidate for immunotherapy for a targetable mutation and is likely to have a response to therapy, therefore Oncology feels like she is unlikely to require a pleurX. This remains an option in the future if she needs it. She needs weekly follow up to ensure she is able to remain out of the hospital until her effusions resolve. -post-CXR likely with cardiomegaly and pericardial effusion as there was minimal residual pleural fluid on [place] post-thora.  Malignant pericardial effusion, enlarging -appreciate Cardiology's input -cautious diuresis  BLE Swelling - no DVT on preliminary read of doppler US -assess LE for DVT in setting of known malignancy   Hypertensive Urgency  -management per primary   Updated patient and bedside and niece over the phone.  Best practice (evaluated daily)  Per primary  Labs   CBC: Recent Labs  Lab 01/25/20 1158 01/26/20 0455  WBC 7.0 6.4  NEUTROABS 5.7  --   HGB 14.2 14.0  HCT 44.1 42.9  MCV 88.7 88.3  PLT 275 254    Basic Metabolic Panel: Recent Labs  Lab 01/25/20 1158 01/26/20 0455  NA 138 135  K 3.4* 3.4*  CL 100 100  CO2 27 23  GLUCOSE 211* 211*  BUN 11 12  CREATININE 0.58 0.62  CALCIUM 9.4 9.1  MG  --  1.8   GFR: Estimated Creatinine Clearance: 59.8 mL/min (by C-G formula based on SCr of 0.62 mg/dL). Recent Labs  Lab 01/25/20 1158 01/26/20 0455  WBC 7.0 6.4    Liver Function Tests: Recent Labs  Lab 01/25/20 1158  AST 36  ALT 54*  ALKPHOS 199*  BILITOT 1.3*  PROT 8.7*  ALBUMIN 3.7   No results for input(s): LIPASE, AMYLASE in the last 168 hours. No results  for input(s): AMMONIA in the last 168 hours.  ABG    Component Value Date/Time   HCO3 25.5 12/30/2019 1118   O2SAT 49.1 12/30/2019 1118     Coagulation Profile: No results for input(s): INR, PROTIME in the last 168 hours.  Cardiac Enzymes: No results for input(s): CKTOTAL, CKMB, CKMBINDEX, TROPONINI in the last 168 hours.  HbA1C: Hgb A1c MFr Bld  Date/Time Value Ref Range Status  12/30/2019 11:18 AM 10.7 (H) 4.8 - 5.6 % Final    Comment:    (NOTE) Pre diabetes:          5.7%-6.4%  Diabetes:              >6.4%  Glycemic control for   <7.0% adults with diabetes     CBG: Recent Labs  Lab 01/25/20 [place] Reshunda Strider, DO 01/26/20 4:14 PM [facility]

## 2020-01-26 NOTE — Progress Notes (Signed)
[facility]  PROGRESS NOTE  Sara Ferguson EYC:[id number] DOB: [birth year] DOA: 01/25/2020 PCP: [name], MD Admit date - 01/25/2020   Admitting Physician Harold Hedge, MD  Outpatient Primary MD for the patient is [name], MD  LOS - 1 Brief Narrative   Sara Ferguson is a 74 y.o. year old female with medical history significant for HTN, diabetes, recent diagnosed adenocarcinoma of the lung who presented on 01/25/2020 with worsening shortness of breath over the past few days and lower extremity swelling and was found to have increased size and malignant pleural effusion with increasing pericardial effusion and hypertensive urgency.   Subjective  Seen prior to thoracentesis she stated she was having difficulty breathing.  No chest pain. A & P  Acute hypoxic respiratory failure secondary to malignant left pleural effusion.  Currently on 3 L O2 previous home regimen is no O2 requirements.  Status post thoracentesis chest x-ray shows decreasing size left effusion moderate in size, likely concern for atelectasis versus infiltrate as well as mild vascular congestion -Will likely repeat thoracentesis given malignancy, -Given vascular congestion would like to continue Lasix however given increasing pericardial effusion will hold off -Wean O2 as able, will likely need ambulatory O2 sats to determine O2 needs  Metastatic non-small cell cancer of the lung.  Status post thoracentesis on 12/9. -Dr.Iruku plans to start immunotherapy treatment within the week and feels she will be very responsive to this therapy, no plan for Pleurx catheter at this admission -MRI brain while inpatient for further staging  Malignant pericardial effusion, enlarging.  Last TTE in November show small in size now moderate on TTE here.  No evidence of cardiac tamponade on TTE, additionally clinically patient is maintaining normal pressures -Cardiology consulted to assist -We will hold off on further  diuresis  Acute diastolic CHF extubation.  EF preserved on TTE this admission.  Obvious vascular congestion and edema on chest x-ray.  Status post IV Lasix on admission -Given evidence of pericardial effusion need to maintain normal blood pressure will hold off on further IV diuresis  Bilateral lower extremity swelling.  Venous duplex negative for DVT.  Likely related to diastolic CHF exacerbation given vascular congestion as noted above  Hypertensive urgency.  Blood pressure peaked at 200s, now in the 140s.  Likely due to volume overload a further complicated by poor adherence to blood pressure regimen.  Patient is not taking her medications -Resume amlodipine, metoprolol -IV hydralazine as needed      Family Communication  : None  Code Status : Full  Disposition Plan  :  Patient is from home. Anticipated d/c date: 2 to 3 days. Barriers to d/c or necessity for inpatient status:  Close monitoring of blood pressure, close monitoring respiratory status, ability to wean O2, close monitoring of effusions (pericardial pleural Consults  : Oncology, PCCM Procedures  : Thoracentesis 12/9  DVT Prophylaxis  :  Lovenox   MDM: The below labs and imaging reports were reviewed and summarized above.  Medication management as above.  Lab Results  Component Value Date   PLT 238 01/26/2020    Diet :  Diet Order            Diet heart healthy/carb modified Room service appropriate? Yes; Fluid consistency: Thin  Diet effective now                  Inpatient Medications Scheduled Meds: . amLODipine  5 mg Oral Daily  . enoxaparin (LOVENOX) injection  40 mg Subcutaneous  Q24H  . insulin glargine  5 Units Subcutaneous Daily  . metoprolol tartrate  25 mg Oral BID  . sodium chloride flush  3 mL Intravenous Q12H   Continuous Infusions: . sodium chloride Stopped (01/25/20 2107)   PRN Meds:.acetaminophen **OR** acetaminophen, albuterol, hydrALAZINE, HYDROcodone-acetaminophen, morphine  injection, polyethylene glycol  Antibiotics  :   Anti-infectives (From admission, onward)   None       Objective   Vitals:   01/26/20 1145 01/26/20 1200 01/26/20 1422 01/26/20 1450  BP: (!) 201/115  (!) 149/63 (!) 145/58  Pulse: (!) 101 95 100 98  Resp: 20  18 16   Temp:    98.5 F (36.9 C)  TempSrc:    Oral  SpO2: 97% 99% 96% 94%  Weight:      Height:        SpO2: 94 % O2 Flow Rate (L/min): 2 L/min  Wt Readings from Last 3 Encounters:  01/25/20 76.2 kg  01/13/20 76.2 kg  12/30/19 72.6 kg     Intake/Output Summary (Last 24 hours) at 01/26/2020 1625 Last data filed at 01/25/2020 2107 Gross per 24 hour  Intake 289.83 ml  Output 1000 ml  Net -710.17 ml    Physical Exam:     Awake Alert, Oriented X 3, Normal affect No new F.N deficits,  [place].AT, Easy conversational dyspnea on 3 L nasal cannula, diminished breath sounds bilateral bases +ve B.Sounds, Abd Soft, No tenderness, No rebound, guarding or rigidity. 1+ pitting edema bilateral lower extremities  I have personally reviewed the following:   Data Reviewed:  CBC Recent Labs  Lab 01/25/20 1158 01/26/20 0455  WBC 7.0 6.4  HGB 14.2 14.0  HCT 44.1 42.9  PLT 275 238  MCV 88.7 88.3  MCH 28.6 28.8  MCHC 32.2 32.6  RDW 13.1 13.0  LYMPHSABS 0.9  --   MONOABS 0.4  --   EOSABS 0.0  --   BASOSABS 0.0  --     Chemistries  Recent Labs  Lab 01/25/20 1158 01/26/20 0455  NA 138 135  K 3.4* 3.4*  CL 100 100  CO2 27 23  GLUCOSE 211* 211*  BUN 11 12  CREATININE 0.58 0.62  CALCIUM 9.4 9.1  MG  --  1.8  AST 36  --   ALT 54*  --   ALKPHOS 199*  --   BILITOT 1.3*  --    ------------------------------------------------------------------------------------------------------------------ No results for input(s): CHOL, HDL, LDLCALC, TRIG, CHOLHDL, LDLDIRECT in the last 72 hours.  Lab Results  Component Value Date   HGBA1C 10.7 (H) 12/30/2019    ------------------------------------------------------------------------------------------------------------------ No results for input(s): TSH, T4TOTAL, T3FREE, THYROIDAB in the last 72 hours.  Invalid input(s): FREET3 ------------------------------------------------------------------------------------------------------------------ No results for input(s): VITAMINB12, FOLATE, FERRITIN, TIBC, IRON, RETICCTPCT in the last 72 hours.  Coagulation profile No results for input(s): INR, PROTIME in the last 168 hours.  No results for input(s): DDIMER in the last 72 hours.  Cardiac Enzymes No results for input(s): CKMB, TROPONINI, MYOGLOBIN in the last 168 hours.  Invalid input(s): CK ------------------------------------------------------------------------------------------------------------------    Component Value Date/Time   BNP 118.3 (H) 01/25/2020 1158    Micro Results Recent Results (from the past 240 hour(s))  Resp Panel by RT-PCR (Flu A&B, Covid) Nasopharyngeal Swab     Status: None   Collection Time: 01/25/20 11:58 AM   Specimen: Nasopharyngeal Swab; Nasopharyngeal(NP) swabs in vial transport medium  Result Value Ref Range Status   SARS Coronavirus 2 by RT PCR NEGATIVE NEGATIVE Final  Comment: (NOTE) SARS-CoV-2 target nucleic acids are NOT DETECTED.  The SARS-CoV-2 RNA is generally detectable in upper respiratory specimens during the acute phase of infection. The lowest concentration of SARS-CoV-2 viral copies this assay can detect is 138 copies/mL. A negative result does not preclude SARS-Cov-2 infection and should not be used as the sole basis for treatment or other patient management decisions. A negative result may occur with  improper specimen collection/handling, submission of specimen other than nasopharyngeal swab, presence of viral mutation(s) within the areas targeted by this assay, and inadequate number of viral copies(<138 copies/mL). A negative result must  be combined with clinical observations, patient history, and epidemiological information. The expected result is Negative.  Fact Sheet for Patients:  EntrepreneurPulse.com.au  Fact Sheet for Healthcare Providers:  IncredibleEmployment.be  This test is no t yet approved or cleared by the [place] FDA and  has been authorized for detection and/or diagnosis of SARS-CoV-2 by FDA under an Emergency Use Authorization (EUA). This EUA will remain  in effect (meaning this test can be used) for the duration of the COVID-19 declaration under Section 564(b)(1) of the Act, 21 U.S.C.section 360bbb-3(b)(1), unless the authorization is terminated  or revoked sooner.       Influenza A by PCR NEGATIVE NEGATIVE Final   Influenza B by PCR NEGATIVE NEGATIVE Final    Comment: (NOTE) The Xpert Xpress SARS-CoV-2/FLU/RSV plus assay is intended as an aid in the diagnosis of influenza from Nasopharyngeal swab specimens and should not be used as a sole basis for treatment. Nasal washings and aspirates are unacceptable for Xpert Xpress SARS-CoV-2/FLU/RSV testing.  Fact Sheet for Patients: EntrepreneurPulse.com.au  Fact Sheet for Healthcare Providers: IncredibleEmployment.be  This test is not yet approved or cleared by the [place] FDA and has been authorized for detection and/or diagnosis of SARS-CoV-2 by FDA under an Emergency Use Authorization (EUA). This EUA will remain in effect (meaning this test can be used) for the duration of the COVID-19 declaration under Section 564(b)(1) of the Act, 21 U.S.C. section 360bbb-3(b)(1), unless the authorization is terminated or revoked.  Performed at [facility], [place] [address], [place], [place] [address]     Radiology Reports DG Chest 1 View  Result Date: 01/04/2020 CLINICAL DATA:  Left thoracentesis, pleural effusions EXAM: CHEST  1 VIEW COMPARISON:   01/02/2020 FINDINGS: Notable reduction in size of the left pleural effusion. No significant pneumothorax. Stable enlargement of the cardiopericardial silhouette. Atherosclerotic calcification of the aortic arch. Bony demineralization. IMPRESSION: 1. Notable reduction in size of the left pleural effusion, status post thoracentesis. No significant pneumothorax. 2. Stable enlargement of the cardiopericardial silhouette. Electronically Signed   By: Van Clines M.D.   On: 01/04/2020 09:08   DG Chest 2 View  Result Date: 01/25/2020 CLINICAL DATA:  Shortness of breath EXAM: CHEST - 2 VIEW COMPARISON:  01/04/2020 FINDINGS: Cardiomegaly. Near complete opacification of the left hemithorax, likely a combination of effusion and airspace disease. Only and small amount of aerated left upper lobe noted which appears to contain airspace disease. Right lung clear. No acute bony abnormality. IMPRESSION: Near complete opacification of the left hemithorax, likely a combination of pleural effusion and airspace disease. Cardiomegaly Electronically Signed   By: Rolm Baptise M.D.   On: 01/25/2020 11:52   DG Chest 2 View  Result Date: 01/02/2020 CLINICAL DATA:  Shortness of breath. Follow-up left pleural effusion. EXAM: CHEST - 2 VIEW COMPARISON:  12/30/2019 FINDINGS: Cardiomegaly remains stable. Previously seen diffuse interstitial infiltrates have resolved.  Increased consolidation is seen in the left lower lobe since prior exam. Small left pleural effusion cannot be excluded. IMPRESSION: Increased left lower lobe consolidation. Small left pleural effusion cannot be excluded. Stable cardiomegaly. Resolution of previously seen diffuse interstitial infiltrates. Electronically Signed   By: Marlaine Hind M.D.   On: 01/02/2020 11:22   CT CHEST W CONTRAST  Result Date: 01/04/2020 CLINICAL DATA:  Non-small cell lung cancer. Patient underwent thoracentesis today. EXAM: CT CHEST WITH CONTRAST TECHNIQUE: Multidetector CT imaging  of the chest was performed during intravenous contrast administration. CONTRAST:  66mL OMNIPAQUE IOHEXOL 300 MG/ML  SOLN COMPARISON:  Abdominopelvic CT 12/31/2019.  Chest CT 12/30/2019. FINDINGS: Cardiovascular: Moderate atherosclerosis of the aorta, great vessels and coronary arteries. No acute vascular findings are seen. There is suboptimal opacification of the pulmonary arteries without evidence of acute pulmonary embolism. Mild cardiomegaly and small to moderate pericardial effusion are again noted. Mediastinum/Nodes: Again demonstrated is extensive confluent mediastinal and hilar adenopathy, similar to previous CT. Right paratracheal node measures approximately 3.3 x 2.9 cm on image 37/2. AP window node measures 3.0 x 3.6 cm on image 37/2. There is a subcarinal node measuring 2.4 cm short axis on image 52/2. Mildly enlarged left axillary lymph nodes are stable, measuring up to 1.6 cm on image 32/2. The thyroid gland, trachea and esophagus demonstrate no significant findings. Lungs/Pleura: The left pleural effusion has decreased in volume and appears partially loculated. There is a new small dependent right pleural effusion. No pneumothorax. The overall aeration of the left lung has improved with a residual left infrahilar mass-like density measuring up to 5.8 x 5.1 cm on image 60/6. There is diffuse central airway thickening in both lungs with narrowing of the left lower lobe central bronchi. Upper abdomen: Again demonstrated is a left adrenal nodule measuring 2.9 x 2.1 cm, suspicious for a metastasis. There is no right adrenal nodule. A nodular density projecting medial to the lower pole of the right kidney on image 28/3 likely represents bowel based on recent prior abdominal CT. No liver lesions are seen. Musculoskeletal/Chest wall: There is no chest wall mass or suspicious osseous finding. Mild degenerative changes throughout the spine. IMPRESSION: 1. Interval decreased volume of left pleural effusion  following thoracentesis. No pneumothorax. 2. Improved aeration of the left lung with residual left infrahilar mass-like density, possibly reflecting the primary bronchogenic carcinoma. 3. Grossly stable left axillary, confluent mediastinal and hilar adenopathy consistent with metastatic disease. 4. Stable left adrenal nodule, suspicious for metastasis. 5. New small dependent right pleural effusion. Stable pericardial effusion. 6. Aortic Atherosclerosis (ICD10-I70.0). Electronically Signed   By: Richardean Sale M.D.   On: 01/04/2020 20:20   CT Chest W Contrast  Result Date: 12/30/2019 CLINICAL DATA:  Large left pleural effusion on chest x-ray. EXAM: CT CHEST WITH CONTRAST TECHNIQUE: Multidetector CT imaging of the chest was performed during intravenous contrast administration. CONTRAST:  65mL OMNIPAQUE IOHEXOL 300 MG/ML  SOLN COMPARISON:  Chest x-ray earlier today. FINDINGS: Cardiovascular: Heart is enlarged. Small to moderate pericardial effusion. Atherosclerotic calcification is noted in the wall of the thoracic aorta. Mediastinum/Nodes: Supraclavicular, thoracic inlet, mediastinal, left internal mammary and bilateral hilar lymphadenopathy evident. Index precarinal lymph node measures 2.5 cm short axis on image 44/series 3. Subcarinal lymph node measures 2.0 cm short axis on 63/3. 1.6 cm short axis right hilar node visible on 57/3 1.6 cm soft tissue nodule in the anterior left hilum is probably a lymph node (image 50/3). Tiny hiatal hernia. The esophagus has normal imaging  features. No right axillary lymphadenopathy. 13 mm short axis left subpectoral node is visible on 28/3 with mild left axillary lymphadenopathy evident. Lungs/Pleura: Large left pleural effusion has anterior loculated component. Some aerated lung noted left upper lobe with otherwise near complete collapse of the left upper and lower lobes. Multiple small enhancing pleural nodules are seen in the posterior left hemithorax (paraspinal on 70/3,  posterior on 107/3, and posterior on 113/3. Upper Abdomen: 2.4 cm left adrenal nodule cannot be definitively characterized. No definite focal lesion within the visualized liver. Musculoskeletal: No worrisome lytic or sclerotic osseous abnormality. No obvious left breast mass although the entire left breast has not been included in the field of view. IMPRESSION: 1. Large left pleural effusion has anterior loculated component and near complete collapse of the left upper and lower lobes. 2. Supraclavicular, mediastinal, bilateral hilar, and left subpectoral/axillary lymphadenopathy consistent with metastatic disease. No primary lesion evident although left lung cancer or left breast cancer (left breast incompletely visualized on this study) would be distinct considerations. 3. 2.4 cm left adrenal nodule cannot be definitively characterized. Metastatic disease a concern. 4. Small to moderate pericardial effusion. 5. Aortic Atherosclerosis (ICD10-I70.0). Electronically Signed   By: Misty Stanley M.D.   On: 12/30/2019 14:29   CT ABDOMEN PELVIS W CONTRAST  Result Date: 12/31/2019 CLINICAL DATA:  Cancer of unknown primary. EXAM: CT ABDOMEN AND PELVIS WITH CONTRAST TECHNIQUE: Multidetector CT imaging of the abdomen and pelvis was performed using the standard protocol following bolus administration of intravenous contrast. CONTRAST:  80mL OMNIPAQUE IOHEXOL 300 MG/ML  SOLN COMPARISON:  December 30, 2019 FINDINGS: Lower chest: Moderate pericardial effusion. Partial visualization of heterogeneously enhancing LEFT lower lung opacities as well as a LEFT pleural effusion. 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 Trace RIGHT pleural effusion. Mild interlobular septal thickening likely reflecting underlying pulmonary edema. Hepatobiliary: Focal fatty deposition adjacent to the falciform ligament. Excreted contrast within the gallbladder. No intrahepatic  or extrahepatic biliary ductal dilation. Portal veins are patent. Pancreas: Unremarkable. No pancreatic ductal dilatation or surrounding inflammatory changes. Spleen: Normal in size without focal abnormality. Adrenals/Urinary Tract: There is a 2.4 cm LEFT adrenal nodule. RIGHT adrenal gland is unremarkable. No hydronephrosis. Kidneys enhance symmetrically. Punctate nonobstructive RIGHT-sided nephrolithiasis. Bladder is unremarkable. Stomach/Bowel: Metallic clips within the pelvis the level of the rectum and LEFT pelvic sidewall. Small hiatal hernia. No focal or diffuse bowel wall thickening. Appendix is normal. No evidence of bowel obstruction. Moderate colonic stool burden. Vascular/Lymphatic: There is a 11 mm likely lymph node posterior to the LEFT diaphragmatic crus (series 2, image 10). There is a prominent LEFT perinephric lymph node which measures 8 mm in the short axis (series 2, image 25). Moderate atherosclerotic calcifications throughout the aorta. Reproductive: Status post hysterectomy. No adnexal masses. Other: Trace free fluid. Musculoskeletal: Osteopenia. Multilevel degenerative changes of the thoracolumbar spine. A LEFT-sided pars defect at L3-4. Degenerative changes of the RIGHT greater than LEFT hip. IMPRESSION: 1. No evidence of primary intra-abdominal or intrapelvic malignancy. Partial visualization of heterogeneously enhancing LEFT lower lung opacities as well as a LEFT pleural effusion. 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 Findings may reflect underlying primary pulmonary malignancy versus metastatic disease. 2. There is a 2.4 cm LEFT adrenal nodule. Findings are concerning for metastatic disease. This could be further evaluated with dedicated adrenal protocol CT or MRI if clinically indicated. 3. Prominent lymph node posterior to the LEFT  diaphragmatic crus and prominent LEFT perinephric lymph node which are  nonspecific but likely reflect nodal metastatic disease. 4. Moderate pericardial effusion. 5. Small RIGHT pleural effusion and likely pulmonary edema. Aortic Atherosclerosis (ICD10-I70.0). Electronically Signed   By: Valentino Saxon MD   On: 12/31/2019 13:54   NM PET Image Initial (PI) Skull Base To Thigh  Result Date: 01/09/2020 CLINICAL DATA:  Initial treatment strategy for non-small cell lung carcinoma. EXAM: NUCLEAR MEDICINE PET SKULL BASE TO THIGH TECHNIQUE: 7.6 mCi F-18 FDG was injected intravenously. Full-ring PET imaging was performed from the skull base to thigh after the radiotracer. CT data was obtained and used for attenuation correction and anatomic localization. Fasting blood glucose: 229 mg/dl COMPARISON:  Chest CT on 03/06/2019 FINDINGS: Mediastinal blood-pool activity (background): SUV max = 2.7 Liver activity (reference): SUV max = N/A NECK: Hypermetabolic lymphadenopathy seen in the lower jugular chains bilaterally, and bilateral supraclavicular regions. Incidental CT findings:  None. CHEST: A rounded masslike opacity is seen in the central left lower lobe which measures 5.2 x 4.7 cm on image 66/4, and is hypermetabolic with SUV max of 4.9. This masslike opacity appears to obstruct the posterior left lower lobe bronchus, resulting in left lower lobe postobstructive atelectasis and pneumonitis. Another focal area of hypermetabolic activity is seen in the posteroinferior left lower lobe which measures approximately 4 cm and has an SUV max of 6.6. This may represent another left lower lobe pulmonary mass. A moderate multiloculated left pleural effusion is seen without hypermetabolic activity. Diffuse left lung interstitial thickening is seen, suspicious for lymphangitic spread of carcinoma. Mild lymphadenopathy is seen throughout the mediastinum, bilateral hilar regions, left internal mammary chain, and left subpectoral and axillary regions, which is hypermetabolic and consistent with  metastatic disease. Index lymph node in the subcarinal region measures 2.0 cm on image 61/4, with SUV max of 5.9. Incidental CT findings: Small to moderate pericardial effusion, without associated hypermetabolic activity. ABDOMEN/PELVIS: No abnormal hypermetabolic activity within the liver, pancreas, or spleen. A 2.4 cm left adrenal mass is seen which shows mild FDG uptake with SUV max of 2.8. This is suspicious for adrenal metastasis. A 1.0 cm left retrocrural lymph node is seen on image 82/4 which is hypermetabolic, with SUV max of 4.2. No other hypermetabolic lymph nodes in the abdomen or pelvis. Incidental CT findings:  None. SKELETON: No focal hypermetabolic bone lesions to suggest skeletal metastasis. Incidental CT findings:  None. IMPRESSION: Hypermetabolic masslike opacities in the central left lower lobe and possibly the posterior-inferior left lower lobe, consistent with primary bronchogenic carcinoma. Moderate multiloculated left pleural effusion, and findings consistent with diffuse left lung lymphangitic spread of carcinoma. Hypermetabolic lymphadenopathy throughout the chest, lower neck, and left retrocrural region, consistent with metastatic disease. 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 Small to moderate pericardial effusion, without associated FDG activity. Electronically Signed   By: Marlaine Hind M.D.   On: 01/09/2020 16:04   DG CHEST PORT 1 VIEW  Result Date: 01/26/2020 CLINICAL DATA:  Post thoracentesis EXAM: PORTABLE CHEST 1 VIEW COMPARISON:  01/25/2020 FINDINGS: Decreasing left effusion, now moderate. Improved aeration in the left lung with continued left lower lobe atelectasis or infiltrate. No pneumothorax. Right lung clear. Cardiomegaly, mild vascular congestion. No acute bony abnormality. IMPRESSION: Moderate left effusion, decreasing since prior study. No pneumothorax. Continued left lower lobe atelectasis or infiltrate. Cardiomegaly, vascular congestion. Electronically Signed   By: Rolm Baptise M.D.   On: 01/26/2020 13:16   DG Chest Portable 1 View  Result Date: 12/30/2019 CLINICAL  DATA:  Post thoracentesis on LEFT EXAM: PORTABLE CHEST 1 VIEW COMPARISON:  Portable exam 1738 hours compared to 10/11 hours FINDINGS: Enlargement of cardiac silhouette with vascular congestion. Atherosclerotic calcification aorta. Peribronchial thickening. Scattered interstitial infiltrates, question pulmonary edema, accentuated by expiratory technique. Significant decrease in LEFT pleural effusion post thoracentesis, with residual atelectasis at LEFT base. No pneumothorax. IMPRESSION: No pneumothorax following LEFT thoracentesis. Electronically Signed   By: Lavonia Dana M.D.   On: 12/30/2019 17:46   DG Chest Portable 1 View  Result Date: 12/30/2019 CLINICAL DATA:  74 year old female with shortness of breath EXAM: PORTABLE CHEST 1 VIEW COMPARISON:  None. FINDINGS: Opacity of the left chest obscures the left hemidiaphragm and the left heart border with meniscus at the top of the opacity. No pneumothorax. Coarsened interstitial markings. No confluent airspace disease within the right lung. IMPRESSION: Large left pleural effusion with associated atelectasis/consolidation. Electronically Signed   By: Corrie Mckusick D.O.   On: 12/30/2019 10:21   ECHOCARDIOGRAM COMPLETE  Result Date: 01/01/2020    ECHOCARDIOGRAM REPORT   Patient Name:   Sara Ferguson Date of Exam: 01/01/2020 Medical Rec #:  [id number]      Height:       64.0 in Accession #:    [id number]     Weight:       160.1 lb Date of Birth:  08/09/1945      BSA:          1.780 m Patient Age:    56 years       BP:           151/73 mmHg Patient Gender: F              HR:           111 bpm. Exam Location:  [facility] Procedure: 2D Echo, Cardiac Doppler and Color Doppler STAT ECHO Indications:    Pericardial effusion  History:        Patient has no prior history of Echocardiogram examinations.                 Pericardial Disease, Signs/Symptoms:Shortness of Breath  and                 Large pleural effusion; Risk Factors:Hypertension and Diabetes.  Sonographer:    Dustin Flock RDCS Referring Phys: [place]  1. Left ventricular ejection fraction, by estimation, is 40 to 45%. The left ventricle has mildly decreased function. The left ventricle demonstrates global hypokinesis. There is moderate concentric left ventricular hypertrophy. Indeterminate diastolic filling due to E-A fusion.  2. Right ventricular systolic function is normal. The right ventricular size is normal. Tricuspid regurgitation signal is inadequate for assessing PA pressure.  3. A small pericardial effusion is present. The pericardial effusion is circumferential. There is no evidence of cardiac tamponade.  4. The mitral valve is normal in structure. No evidence of mitral valve regurgitation. No evidence of mitral stenosis.  5. The aortic valve is normal in structure. Aortic valve regurgitation is not visualized. No aortic stenosis is present.  6. The inferior vena cava is dilated in size with <50% respiratory variability, suggesting right atrial pressure of 15 mmHg. FINDINGS  Left Ventricle: Left ventricular ejection fraction, by estimation, is 40 to 45%. The left ventricle has mildly decreased function. The left ventricle demonstrates global hypokinesis. The left ventricular internal cavity size was normal in size. There is  moderate concentric left ventricular hypertrophy. Indeterminate diastolic filling due to E-A fusion. Right  Ventricle: The right ventricular size is normal. No increase in right ventricular wall thickness. Right ventricular systolic function is normal. Tricuspid regurgitation signal is inadequate for assessing PA pressure. Left Atrium: Left atrial size was normal in size. Right Atrium: Right atrial size was normal in size. Pericardium: A small pericardial effusion is present. The pericardial effusion is circumferential. There is no evidence of cardiac tamponade.  Mitral Valve: The mitral valve is normal in structure. Mild mitral annular calcification. No evidence of mitral valve regurgitation. No evidence of mitral valve stenosis. Tricuspid Valve: The tricuspid valve is normal in structure. Tricuspid valve regurgitation is not demonstrated. No evidence of tricuspid stenosis. Aortic Valve: The aortic valve is normal in structure. Aortic valve regurgitation is not visualized. No aortic stenosis is present. Pulmonic Valve: The pulmonic valve was normal in structure. Pulmonic valve regurgitation is not visualized. No evidence of pulmonic stenosis. Aorta: The aortic root is normal in size and structure. Venous: The inferior vena cava is dilated in size with less than 50% respiratory variability, suggesting right atrial pressure of 15 mmHg. IAS/Shunts: No atrial level shunt detected by color flow Doppler.  LEFT VENTRICLE PLAX 2D LVIDd:         3.09 cm  Diastology LVIDs:         2.27 cm  LV e' medial:    4.24 cm/s LV PW:         1.65 cm  LV E/e' medial:  23.2 LV IVS:        1.67 cm  LV e' lateral:   4.68 cm/s LVOT diam:     1.80 cm  LV E/e' lateral: 21.0 LV SV:         42 LV SV Index:   23 LVOT Area:     2.54 cm  RIGHT VENTRICLE RV Basal diam:  2.77 cm RV S prime:     13.20 cm/s TAPSE (M-mode): 2.5 cm LEFT ATRIUM             Index       RIGHT ATRIUM           Index LA diam:        3.60 cm 2.02 cm/m  RA Area:     12.30 cm LA Vol (A2C):   46.0 ml 25.85 ml/m RA Volume:   28.50 ml  16.01 ml/m LA Vol (A4C):   49.0 ml 27.53 ml/m LA Biplane Vol: 52.1 ml 29.28 ml/m  AORTIC VALVE LVOT Vmax:   86.70 cm/s LVOT Vmean:  63.300 cm/s LVOT VTI:    0.164 m  AORTA Ao Root diam: 2.70 cm MITRAL VALVE MV Area (PHT): 12.04 cm    SHUNTS MV Decel Time: 63 msec      Systemic VTI:  0.16 m MV E velocity: 98.50 cm/s   Systemic Diam: 1.80 cm MV A velocity: 147.00 cm/s MV E/A ratio:  0.67 Mihai Croitoru MD Electronically signed by Sanda Klein MD Signature Date/Time: 01/01/2020/1:41:21 PM    Final     VAS [place] LOWER EXTREMITY VENOUS (DVT)  Result Date: 01/26/2020  Lower Venous DVT Study Indications: Pain, and Swelling.  Risk Factors: None identified. Limitations: Body habitus and poor ultrasound/tissue interface. Comparison Study: No prior studies. Performing Technologist: Oliver Hum RVT  Examination Guidelines: A complete evaluation includes B-mode imaging, spectral Doppler, color Doppler, and power Doppler as needed of all accessible portions of each vessel. Bilateral testing is considered an integral part of a complete examination. Limited examinations for reoccurring indications may be performed  as noted. The reflux portion of the exam is performed with the patient in reverse Trendelenburg.  +---------+---------------+---------+-----------+----------+--------------+ RIGHT    CompressibilityPhasicitySpontaneityPropertiesThrombus Aging +---------+---------------+---------+-----------+----------+--------------+ CFV      Full           Yes      Yes                                 +---------+---------------+---------+-----------+----------+--------------+ SFJ      Full                                                        +---------+---------------+---------+-----------+----------+--------------+ FV Prox  Full                                                        +---------+---------------+---------+-----------+----------+--------------+ FV Mid   Full                                                        +---------+---------------+---------+-----------+----------+--------------+ FV DistalFull                                                        +---------+---------------+---------+-----------+----------+--------------+ PFV      Full                                                        +---------+---------------+---------+-----------+----------+--------------+ POP      Full           Yes      Yes                                  +---------+---------------+---------+-----------+----------+--------------+ PTV      Full                                                        +---------+---------------+---------+-----------+----------+--------------+ PERO     Full                                                        +---------+---------------+---------+-----------+----------+--------------+   +---------+---------------+---------+-----------+----------+--------------+ LEFT     CompressibilityPhasicitySpontaneityPropertiesThrombus Aging +---------+---------------+---------+-----------+----------+--------------+ CFV      Full           Yes  Yes                                 +---------+---------------+---------+-----------+----------+--------------+ SFJ      Full                                                        +---------+---------------+---------+-----------+----------+--------------+ FV Prox  Full                                                        +---------+---------------+---------+-----------+----------+--------------+ FV Mid   Full                                                        +---------+---------------+---------+-----------+----------+--------------+ FV DistalFull                                                        +---------+---------------+---------+-----------+----------+--------------+ PFV      Full                                                        +---------+---------------+---------+-----------+----------+--------------+ POP      Full           Yes      Yes                                 +---------+---------------+---------+-----------+----------+--------------+ PTV      Full                                                        +---------+---------------+---------+-----------+----------+--------------+ PERO     Full                                                         +---------+---------------+---------+-----------+----------+--------------+     Summary: RIGHT: - There is no evidence of deep vein thrombosis in the lower extremity.  - No cystic structure found in the popliteal fossa.  LEFT: - There is no evidence of deep vein thrombosis in the lower extremity.  - No cystic structure found in the popliteal fossa.  *See table(s) above for measurements and observations.    Preliminary    ECHOCARDIOGRAM LIMITED  Result Date: 01/26/2020  ECHOCARDIOGRAM LIMITED REPORT   Patient Name:   Sara Ferguson Date of Exam: 01/26/2020 Medical Rec #:  [id number]      Height:       64.0 in Accession #:    [id number]     Weight:       168.0 lb Date of Birth:  October 19, 1945      BSA:          1.817 m Patient Age:    18 years       BP:           175/125 mmHg Patient Gender: F              HR:           111 bpm. Exam [facility]  [facility] Procedure: Cardiac Doppler, Color Doppler and Limited Echo REPORT CONTAINS CRITICAL RESULT Indications:    I31.3 Pericardial effusion (noninflammatory) Respiratory                 failure.  History:        Patient has prior history of Echocardiogram examinations, most                 recent 01/01/2020. Abnormal ECG, Signs/Symptoms:Shortness of                 Breath and Dyspnea; Risk Factors:Hypertension and Diabetes.                 Cancer. Pericardial effusion. Pleural effusion.  Sonographer:    Roseanna Rainbow RDCS Referring Phys: [id number] Donita Brooks  Sonographer Comments: Technically difficult study due to poor echo windows. IMPRESSIONS  1. Left ventricular ejection fraction, by estimation, is 55 to 60%. The left ventricle has normal function. The left ventricle has no regional wall motion abnormalities.  2. Right ventricular systolic function is normal. The right ventricular size is normal.  3. Moderate pericardial effusion surrounds heart Measurese about 14 mm in maximal dimension. Ther is mild RV/RA indentation Mitral inflow is without respiratory variation. IVC  is dilated and does not change significantly with inspiration. Overall does not meet full echo criteria for tamponade. Clinical correlation indicated. Compared to previous echo, effusion is circumferential Page sent to Dr Lonny Prude. Large pleural effusion.  4. The mitral valve is abnormal. Trivial mitral valve regurgitation.  5. The aortic valve is tricuspid. Mild aortic valve sclerosis is present, with no evidence of aortic valve stenosis.  6. The inferior vena cava is dilated in size with <50% respiratory variability, suggesting right atrial pressure of 15 mmHg. FINDINGS  Left Ventricle: Left ventricular ejection fraction, by estimation, is 55 to 60%. The left ventricle has normal function. The left ventricle has no regional wall motion abnormalities. Right Ventricle: The right ventricular size is normal. Right vetricular wall thickness was not assessed. Right ventricular systolic function is normal. Right Atrium: Right atrial size was normal in size. Pericardium: Moderate pericardial effusion surrounds heart Measurese about 14 mm in maximal dimension. Ther is mild RV/RA indentation Mitral inflow is without respiratory variation. IVC is dilated and does not change significantly with inspiration. Overall does not meet full echo criteria for tamponade. Clinical correlation indicated. Compared to previous echo, effusion is circumferential Page sent to Dr Lonny Prude. The pericardial effusion is circumferential. Mitral Valve: The mitral valve is abnormal. There is mild thickening of the mitral valve leaflet(s). Mild mitral annular calcification. Trivial mitral valve regurgitation. Tricuspid Valve: The tricuspid valve is grossly normal. Tricuspid valve regurgitation is trivial. Aortic Valve: The aortic  valve is tricuspid. Mild aortic valve sclerosis is present, with no evidence of aortic valve stenosis. Aorta: The aortic root is normal in size and structure. Venous: The inferior vena cava is dilated in size with less than 50%  respiratory variability, suggesting right atrial pressure of 15 mmHg. Additional Comments: There is a large pleural effusion. LEFT VENTRICLE PLAX 2D LVIDd:         2.95 cm LVIDs:         2.10 cm LV PW:         1.85 cm LV IVS:        1.35 cm LVOT diam:     1.60 cm LVOT Area:     2.01 cm  LEFT ATRIUM         Index LA diam:    2.10 cm 1.16 cm/m   AORTA Ao Root diam: 2.65 cm  SHUNTS Systemic Diam: 1.60 cm Dorris Carnes MD Electronically signed by Dorris Carnes MD Signature Date/Time: 01/26/2020/12:03:22 PM    Final    US THORACENTESIS ASP PLEURAL SPACE W/IMG GUIDE  Result Date: 01/04/2020 INDICATION: Left pleural effusion Known Non small cell lung cancer EXAM: ULTRASOUND GUIDED LEFT THORACENTESIS MEDICATIONS: 10 cc 1% lidocaine. COMPLICATIONS: None immediate. PROCEDURE: An ultrasound guided thoracentesis was thoroughly discussed with the patient and questions answered. The benefits, risks, alternatives and complications were also discussed. The patient understands and wishes to proceed with the procedure. Written consent was obtained. Ultrasound was performed to localize and mark an adequate pocket of fluid in the left chest. The area was then prepped and draped in the normal sterile fashion. 1% Lidocaine was used for local anesthesia. Under ultrasound guidance a 19 G Yueh catheter was introduced. Thoracentesis was performed. The catheter was removed and a dressing applied. FINDINGS: A total of approximately 850 cc of serosanguinous fluid was removed. Samples were sent to the laboratory as requested by the clinical team. IMPRESSION: Successful ultrasound guided left thoracentesis yielding 850 cc of pleural fluid. Postprocedural chest radiograph showed no pneumothorax. Read by Lavonia Drafts [facility] Electronically Signed   By: Van Clines M.D.   On: 01/04/2020 09:03   US THORACENTESIS ASP PLEURAL SPACE W/IMG GUIDE  Result Date: 12/30/2019 INDICATION: LEFT pleural effusion EXAM: ULTRASOUND GUIDED DIAGNOSTIC AND  THERAPEUTIC LEFT THORACENTESIS MEDICATIONS: None. COMPLICATIONS: None immediate. PROCEDURE: An ultrasound guided thoracentesis was thoroughly discussed with the patient and questions answered. The benefits, risks, alternatives and complications were also discussed. The patient understands and wishes to proceed with the procedure. Written consent was obtained. Ultrasound was performed to localize and mark an adequate pocket of fluid in the LEFT chest. The area was then prepped and draped in the normal sterile fashion. 1% Lidocaine was used for local anesthesia. Under ultrasound guidance a 8 French thoracentesis catheter was introduced. Thoracentesis was performed. The catheter was removed and a dressing applied. FINDINGS: A total of approximately 1.08 L of cloudy yellow fluid was removed. Samples were sent to the laboratory as requested by the clinical team. IMPRESSION: Successful ultrasound guided LEFT thoracentesis yielding 1.08 L of pleural fluid. Electronically Signed   By: Lavonia Dana M.D.   On: 12/30/2019 17:42     Time Spent in minutes  30     Desiree Hane M.D on 01/26/2020 at 4:25 PM  To page go to [email or web address] - password [facility]

## 2020-01-26 NOTE — Procedures (Signed)
Thoracentesis  Procedure Note  Sara Ferguson  [id number]  12-17-1945  Date:01/26/20  Time:12:22 PM   Provider Performing:Maudell Stanbrough Naomie Dean   Procedure: Thoracentesis with imaging guidance ([id number])  Indication(s) Pleural Effusion  Consent Risks of the procedure as well as the alternatives and risks of each were explained to the patient and/or caregiver.  Consent for the procedure was obtained and is signed in the bedside chart  Anesthesia Topical only with 1% lidocaine    Time Out Verified patient identification, verified procedure, site/side was marked, verified correct patient position, special equipment/implants available, medications/allergies/relevant history reviewed, required imaging and test results available.   Sterile Technique Maximal sterile technique including full sterile barrier drape, hand hygiene, sterile gown, sterile gloves, mask, hair covering, sterile ultrasound probe cover (if used).  Procedure Description Ultrasound was used to identify appropriate pleural anatomy for placement and overlying skin marked.  Area of drainage cleaned and draped in sterile fashion. Lidocaine was used to anesthetize the skin and subcutaneous tissue.  1500 ccs of amber appearing fluid was drained from the left pleural space. Catheter then removed and bandaid applied to site.   Complications/Tolerance None; patient tolerated the procedure well. Chest X-ray is ordered to confirm no post-procedural complication.   EBL Minimal   Specimen(s) None  Julian Hy, DO 01/26/20 12:23 PM [facility]

## 2020-01-26 NOTE — Progress Notes (Signed)
Bilateral lower extremity venous duplex has been completed. Preliminary results can be found in CV Proc through chart review.   01/26/20 9:43 AM Carlos Levering RVT

## 2020-01-26 NOTE — Consult Note (Signed)
[facility] CONSULT NOTE  Patient Care Team: Sara Pike, MD as PCP - General (Hematology and Oncology) Sara Ferguson, Sara Brothers, RN as Oncology Nurse Navigator (Oncology)  CHIEF COMPLAINTS/PURPOSE OF CONSULTATION:  SOB  ASSESSMENT & PLAN:  No problem-specific Assessment & Plan notes found for this encounter.  This is a very pleasant 74 year old female patient with past medical history significant for diabetes mellitus, hypertension who was referred to oncology for adenocarcinoma of the lung.  Patient was found to have pleural effusion, mild to moderate pericardial effusion upon presentation.   On initial imaging from November, she was found to have large left pleural effusion, supraclavicular, mediastinal, bilateral hilar, left subpectoral, axillary adenopathy consistent with metastatic disease. Head CT showed hypermetabolic masslike opacities in the central left lower lobe and possibly the posterior inferior left lower lobe consistent with primary bronchogenic carcinoma, moderate multiloculated left pleural effusion and findings consistent with diffuse lymphangitic spread of carcinoma.  There was hypermetabolic lymphadenopathy throughout the chest, lower neck and left retrocrural region suggestive of metastatic disease and possibly left adrenal metastasis. Cytology from the pleural fluid on the left side suggested metastatic adenocarcinoma.  PD-L1 30%, rest of the foundation panel is pending. We are hoping to receive foundation one results back on 01/30/2020.   I have seen the patient and discussed that we should consider doing thoracentesis without Pleurex since she could have an excellent response if she is a candidate for targeted therapy. She is a non-smoker and has adenocarcinoma histology hence we are hoping to find a molecular mutation that will allow [place] to use targeted therapy and some of these patients can have sustained excellent responses and may not need thoracentesis for  a very long time after initiating treatment.  Again we are hoping to start this treatment as soon as we have the results.  Please consider doing an MRI brain if patient will be patient to complete staging.  She has a scheduled for outpatient on 01/28/2020. We will see her periodically while she is admitted.  Have discussed the plan of care with niece and Dr Sara Ferguson. Thank you and we appreciate your assistance in taking care of her patient.   Will follow her up outpatient upon discharge.  HISTORY OF PRESENTING ILLNESS:  Sara Ferguson 73 y.o. female is here because of worsening SOB  This is a very pleasant 74 year old female patient with past medical history significant for hypertension, diabetes mellitus type 2 referred to hematology and medical oncology for new diagnosis of adenocarcinoma of the lung. [name] arrived to the appointment today with her niece Sara Ferguson.  According to patient, she has been having symptoms for the past 4 to 5 months predominantly shortness of breath, cough without any hemoptysis and some weight loss which she attributed to the dietary changes.  She was admitted at [facility] with the above-mentioned complaints and had imaging suggestive of pleural effusion and left lung, mild to moderate pericardial effusion and g changes reflecting primary bronchogenic carcinoma grossly stable left axillary confluent mediastinal and hilar adenopathy.  CT abdomen with left adrenal nodule suggestive of possible metastatic disease.  She had cytology which confirmed the diagnosis of adenocarcinoma.  Molecular spending. 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 Given her non-smoking history and adenocarcinoma histology, it is extremely beneficial to proceed with molecular evaluation. PDL 1 30 % Rest of the foundation one profile is pending at this time, expect results to be out on 01/30/2020. Niece called [place] Monday that patient is SOB and will need thoracocentesis. We arranged for it outpatient but the earliest appointment was 01/27/2020 She however got very SOB and hence decided to come to ED. She has also noted LE swelling.  BP has not been well controlled, she had high BP even during my visit a couple weeks ago Rest of the pertinent ROS negative.  MEDICAL HISTORY:  Past Medical History:  Diagnosis Date  . Arthritis   . Diabetes mellitus without complication ([place])   . Hypertension   . Non-small cell lung cancer ([place])    Dx 12/30/19 by thoracentesis     SURGICAL HISTORY: Past Surgical History:  Procedure Laterality Date  . ABDOMINAL HYSTERECTOMY    . ECTOPIC PREGNANCY SURGERY      SOCIAL HISTORY: Social History   Socioeconomic History  . Marital status: Widowed    Spouse name: Not on file  . Number of children: Not on file  . Years of education: Not on file  . Highest education level: Not on file  Occupational History  . Not on file  Tobacco Use  . Smoking status: Never Smoker  . Smokeless tobacco: Never Used  Substance and Sexual Activity  . Alcohol use: Not Currently  . Drug use: Never  . Sexual activity: Not on file  Other Topics Concern  . Not on file  Social History Narrative  . Not on file   Social Determinants of Health   Financial Resource Strain: Low Risk   . Difficulty of Paying Living Expenses: Not hard at all  Food Insecurity: No Food Insecurity  . Worried About Charity fundraiser in the Last Year: Never true  . Ran Out of Food  in the Last Year: Never true  Transportation Needs: No Transportation Needs  . Lack of Transportation (Medical): No  . Lack of Transportation (Non-Medical): No  Physical Activity: Inactive  . Days of Exercise per Week: 0 days  . Minutes of Exercise per Session: 0 min  Stress: No Stress Concern Present  . Feeling of Stress : Not at all  Social Connections: Socially Isolated  . Frequency of Communication with Friends and Family: Once a week  . Frequency of Social Gatherings with Friends and Family: Once a week  . Attends Religious Services: Never  . Active Member of Clubs or Organizations: No  . Attends Archivist Meetings: 1 to 4 times per year  . Marital Status: Widowed  Intimate Partner Violence: Not At Risk  . Fear of Current or Ex-Partner: No  . Emotionally Abused: No  . Physically Abused: No  . Sexually Abused: No    FAMILY HISTORY: History reviewed. No pertinent family history.  ALLERGIES:  has No Known Allergies.  MEDICATIONS:  Current Facility-Administered Medications  Medication Dose Route Frequency Provider Last Rate Last Admin  . 0.9 %  sodium chloride infusion   Intravenous Continuous Harold Hedge, MD   Stopped at 01/25/20 2107  . acetaminophen (TYLENOL) tablet 650 mg  650 mg Oral Q6H PRN Harold Hedge, MD       Or  . acetaminophen (TYLENOL) suppository 650 mg  650 mg  Rectal Q6H PRN Harold Hedge, MD      . albuterol (VENTOLIN HFA) 108 (90 Base) MCG/ACT inhaler 2 puff  2 puff Inhalation Q6H PRN Harold Hedge, MD      . amLODipine (NORVASC) tablet 5 mg  5 mg Oral Daily Harold Hedge, MD   5 mg at 01/26/20 1014  . enoxaparin (LOVENOX) injection 40 mg  40 mg Subcutaneous Q24H Harold Hedge, MD   40 mg at 01/25/20 1525  . hydrALAZINE (APRESOLINE) injection 10 mg  10 mg Intravenous Q8H PRN Harold Hedge, MD   10 mg at 01/26/20 0214  . HYDROcodone-acetaminophen (NORCO/VICODIN) 5-325 MG per tablet 1-2 tablet  1-2 tablet Oral Q4H PRN Harold Hedge, MD       . insulin glargine (LANTUS) injection 5 Units  5 Units Subcutaneous Daily Harold Hedge, MD   5 Units at 01/26/20 1026  . metoprolol tartrate (LOPRESSOR) tablet 25 mg  25 mg Oral BID Harold Hedge, MD   25 mg at 01/26/20 1014  . morphine 2 MG/ML injection 2 mg  2 mg Intravenous Q2H PRN Harold Hedge, MD      . polyethylene glycol (MIRALAX / GLYCOLAX) packet 17 g  17 g Oral Daily PRN Harold Hedge, MD      . sodium chloride flush (NS) 0.9 % injection 3 mL  3 mL Intravenous Q12H Harold Hedge, MD   3 mL at 01/26/20 1015   Current Outpatient Medications  Medication Sig Dispense Refill  . albuterol (VENTOLIN HFA) 108 (90 Base) MCG/ACT inhaler Inhale 2 puffs into the lungs every 6 (six) hours as needed for wheezing or shortness of breath. 1 each 1  . amLODipine (NORVASC) 5 MG tablet Take 1 tablet (5 mg total) by mouth daily. 30 tablet 1  . guaifenesin (ROBITUSSIN) 100 MG/5ML syrup Take 200 mg by mouth 3 (three) times daily as needed for cough.    . hydrocortisone cream 1 % Apply 1 application topically daily as needed for itching (rash).    . metoprolol tartrate (LOPRESSOR) 25 MG tablet Take 1 tablet (25 mg total) by mouth 2 (two) times daily. 60 tablet 1  . tetrahydrozoline 0.05 % ophthalmic solution Place 1 drop into both eyes daily as needed (dry, itchy eyes).    . furosemide (LASIX) 20 MG tablet Take 1 tablet (20 mg total) by mouth daily for 14 days. 14 tablet 0     PHYSICAL EXAMINATION: ECOG PERFORMANCE STATUS: 2 - Symptomatic, <50% confined to bed  Vitals:   01/26/20 1145 01/26/20 1200  BP: (!) 201/115   Pulse: (!) 101 95  Resp: 20   Temp:    SpO2: 97% 99%   Filed Weights   01/25/20 1125  Weight: 168 lb (76.2 kg)    GENERAL:aler, mild respiratory distress, sitting in chair. HEART: regular rate & rhythm and no murmurs and no lower extremity edema Diminished air entry left lung. NEURO: no focal motor/sensory deficits, alert able to maintain the conversation. BLE  edema.  LABORATORY DATA:  I have reviewed the data as listed Lab Results  Component Value Date   WBC 6.4 01/26/2020   HGB 14.0 01/26/2020   HCT 42.9 01/26/2020   MCV 88.3 01/26/2020   PLT 238 01/26/2020   '@LASTCHEM'$ @  RADIOGRAPHIC STUDIES: I have personally reviewed the radiological images as listed and agreed with the findings in the report. DG Chest 1 View  Result Date: 01/04/2020 CLINICAL DATA:  Left thoracentesis, pleural  effusions EXAM: CHEST  1 VIEW COMPARISON:  01/02/2020 FINDINGS: Notable reduction in size of the left pleural effusion. No significant pneumothorax. Stable enlargement of the cardiopericardial silhouette. Atherosclerotic calcification of the aortic arch. Bony demineralization. IMPRESSION: 1. Notable reduction in size of the left pleural effusion, status post thoracentesis. No significant pneumothorax. 2. Stable enlargement of the cardiopericardial silhouette. Electronically Signed   By: Van Clines M.D.   On: 01/04/2020 09:08   DG Chest 2 View  Result Date: 01/25/2020 CLINICAL DATA:  Shortness of breath EXAM: CHEST - 2 VIEW COMPARISON:  01/04/2020 FINDINGS: Cardiomegaly. Near complete opacification of the left hemithorax, likely a combination of effusion and airspace disease. Only and small amount of aerated left upper lobe noted which appears to contain airspace disease. Right lung clear. No acute bony abnormality. IMPRESSION: Near complete opacification of the left hemithorax, likely a combination of pleural effusion and airspace disease. Cardiomegaly Electronically Signed   By: Rolm Baptise M.D.   On: 01/25/2020 11:52   DG Chest 2 View  Result Date: 01/02/2020 CLINICAL DATA:  Shortness of breath. Follow-up left pleural effusion. EXAM: CHEST - 2 VIEW COMPARISON:  12/30/2019 FINDINGS: Cardiomegaly remains stable. Previously seen diffuse interstitial infiltrates have resolved. Increased consolidation is seen in the left lower lobe since prior exam. Small left  pleural effusion cannot be excluded. IMPRESSION: Increased left lower lobe consolidation. Small left pleural effusion cannot be excluded. Stable cardiomegaly. Resolution of previously seen diffuse interstitial infiltrates. Electronically Signed   By: Marlaine Hind M.D.   On: 01/02/2020 11:22   CT CHEST W CONTRAST  Result Date: 01/04/2020 CLINICAL DATA:  Non-small cell lung cancer. Patient underwent thoracentesis today. EXAM: CT CHEST WITH CONTRAST TECHNIQUE: Multidetector CT imaging of the chest was performed during intravenous contrast administration. CONTRAST:  74mL OMNIPAQUE IOHEXOL 300 MG/ML  SOLN COMPARISON:  Abdominopelvic CT 12/31/2019.  Chest CT 12/30/2019. FINDINGS: Cardiovascular: Moderate atherosclerosis of the aorta, great vessels and coronary arteries. No acute vascular findings are seen. There is suboptimal opacification of the pulmonary arteries without evidence of acute pulmonary embolism. Mild cardiomegaly and small to moderate pericardial effusion are again noted. Mediastinum/Nodes: Again demonstrated is extensive confluent mediastinal and hilar adenopathy, similar to previous CT. Right paratracheal node measures approximately 3.3 x 2.9 cm on image 37/2. AP window node measures 3.0 x 3.6 cm on image 37/2. There is a subcarinal node measuring 2.4 cm short axis on image 52/2. Mildly enlarged left axillary lymph nodes are stable, measuring up to 1.6 cm on image 32/2. The thyroid gland, trachea and esophagus demonstrate no significant findings. Lungs/Pleura: The left pleural effusion has decreased in volume and appears partially loculated. There is a new small dependent right pleural effusion. No pneumothorax. The overall aeration of the left lung has improved with a residual left infrahilar mass-like density measuring up to 5.8 x 5.1 cm on image 60/6. There is diffuse central airway thickening in both lungs with narrowing of the left lower lobe central bronchi. Upper abdomen: Again demonstrated is  a left adrenal nodule measuring 2.9 x 2.1 cm, suspicious for a metastasis. There is no right adrenal nodule. A nodular density projecting medial to the lower pole of the right kidney on image 28/3 likely represents bowel based on recent prior abdominal CT. No liver lesions are seen. Musculoskeletal/Chest wall: There is no chest wall mass or suspicious osseous finding. Mild degenerative changes throughout the spine. IMPRESSION: 1. Interval decreased volume of left pleural effusion following thoracentesis. No pneumothorax. 2. Improved aeration of the  left lung with residual left infrahilar mass-like density, possibly reflecting the primary bronchogenic carcinoma. 3. Grossly stable left axillary, confluent mediastinal and hilar adenopathy consistent with metastatic disease. 4. Stable left adrenal nodule, suspicious for metastasis. 5. New small dependent right pleural effusion. Stable pericardial effusion. 6. Aortic Atherosclerosis (ICD10-I70.0). Electronically Signed   By: Richardean Sale M.D.   On: 01/04/2020 20:20   CT Chest W Contrast  Result Date: 12/30/2019 CLINICAL DATA:  Large left pleural effusion on chest x-ray. EXAM: CT CHEST WITH CONTRAST TECHNIQUE: Multidetector CT imaging of the chest was performed during intravenous contrast administration. CONTRAST:  52mL OMNIPAQUE IOHEXOL 300 MG/ML  SOLN COMPARISON:  Chest x-ray earlier today. FINDINGS: Cardiovascular: Heart is enlarged. Small to moderate pericardial effusion. Atherosclerotic calcification is noted in the wall of the thoracic aorta. Mediastinum/Nodes: Supraclavicular, thoracic inlet, mediastinal, left internal mammary and bilateral hilar lymphadenopathy evident. Index precarinal lymph node measures 2.5 cm short axis on image 44/series 3. Subcarinal lymph node measures 2.0 cm short axis on 63/3. 1.6 cm short axis right hilar node visible on 57/3 1.6 cm soft tissue nodule in the anterior left hilum is probably a lymph node (image 50/3). Tiny hiatal  hernia. The esophagus has normal imaging features. No right axillary lymphadenopathy. 13 mm short axis left subpectoral node is visible on 28/3 with mild left axillary lymphadenopathy evident. Lungs/Pleura: Large left pleural effusion has anterior loculated component. Some aerated lung noted left upper lobe with otherwise near complete collapse of the left upper and lower lobes. Multiple small enhancing pleural nodules are seen in the posterior left hemithorax (paraspinal on 70/3, posterior on 107/3, and posterior on 113/3. Upper Abdomen: 2.4 cm left adrenal nodule cannot be definitively characterized. No definite focal lesion within the visualized liver. Musculoskeletal: No worrisome lytic or sclerotic osseous abnormality. No obvious left breast mass although the entire left breast has not been included in the field of view. IMPRESSION: 1. Large left pleural effusion has anterior loculated component and near complete collapse of the left upper and lower lobes. 2. Supraclavicular, mediastinal, bilateral hilar, and left subpectoral/axillary lymphadenopathy consistent with metastatic disease. No primary lesion evident although left lung cancer or left breast cancer (left breast incompletely visualized on this study) would be distinct considerations. 3. 2.4 cm left adrenal nodule cannot be definitively characterized. Metastatic disease a concern. 4. Small to moderate pericardial effusion. 5. Aortic Atherosclerosis (ICD10-I70.0). Electronically Signed   By: Misty Stanley M.D.   On: 12/30/2019 14:29   CT ABDOMEN PELVIS W CONTRAST  Result Date: 12/31/2019 CLINICAL DATA:  Cancer of unknown primary. EXAM: CT ABDOMEN AND PELVIS WITH CONTRAST TECHNIQUE: Multidetector CT imaging of the abdomen and pelvis was performed using the standard protocol following bolus administration of intravenous contrast. CONTRAST:  71mL OMNIPAQUE IOHEXOL 300 MG/ML  SOLN COMPARISON:  December 30, 2019 FINDINGS: Lower chest: Moderate  pericardial effusion. Partial visualization of heterogeneously enhancing LEFT lower lung opacities as well as a LEFT pleural effusion. 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 Trace RIGHT pleural effusion. Mild interlobular septal thickening likely reflecting underlying pulmonary edema. Hepatobiliary: Focal fatty deposition adjacent to the falciform ligament. Excreted contrast within the gallbladder. No intrahepatic or extrahepatic biliary ductal dilation. Portal veins are patent. Pancreas: Unremarkable. No pancreatic ductal dilatation or surrounding inflammatory changes. Spleen: Normal in size without focal abnormality. Adrenals/Urinary Tract: There is a 2.4 cm LEFT adrenal nodule. RIGHT adrenal gland is unremarkable. No hydronephrosis. Kidneys enhance symmetrically. Punctate nonobstructive RIGHT-sided nephrolithiasis.  Bladder is unremarkable. Stomach/Bowel: Metallic clips within the pelvis the level of the rectum and LEFT pelvic sidewall. Small hiatal hernia. No focal or diffuse bowel wall thickening. Appendix is normal. No evidence of bowel obstruction. Moderate colonic stool burden. Vascular/Lymphatic: There is a 11 mm likely lymph node posterior to the LEFT diaphragmatic crus (series 2, image 10). There is a prominent LEFT perinephric lymph node which measures 8 mm in the short axis (series 2, image 25). Moderate atherosclerotic calcifications throughout the aorta. Reproductive: Status post hysterectomy. No adnexal masses. Other: Trace free fluid. Musculoskeletal: Osteopenia. Multilevel degenerative changes of the thoracolumbar spine. A LEFT-sided pars defect at L3-4. Degenerative changes of the RIGHT greater than LEFT hip. IMPRESSION: 1. No evidence of primary intra-abdominal or intrapelvic malignancy. Partial visualization of heterogeneously enhancing LEFT lower lung opacities as well as a LEFT pleural effusion. There is enhancing  nodularity along the pleural surface which likely reflects underlying malignant pleural effusion. Findings may reflect underlying primary pulmonary malignancy versus metastatic disease. 2. There is a 2.4 cm LEFT adrenal nodule. Findings are concerning for metastatic disease. This could be further evaluated with dedicated adrenal protocol CT or MRI if clinically indicated. 3. Prominent lymph node posterior to the LEFT diaphragmatic crus and prominent LEFT perinephric lymph node which are nonspecific but likely reflect nodal metastatic disease. 4. Moderate pericardial effusion. 5. Small RIGHT pleural effusion and likely pulmonary edema. Aortic Atherosclerosis (ICD10-I70.0). Electronically Signed   By: Valentino Saxon MD   On: 12/31/2019 13:54   NM PET Image Initial (PI) Skull Base To Thigh  Result Date: 01/09/2020 CLINICAL DATA:  Initial treatment strategy for non-small cell lung carcinoma. EXAM: NUCLEAR MEDICINE PET SKULL BASE TO THIGH TECHNIQUE: 7.6 mCi F-18 FDG was injected intravenously. Full-ring PET imaging was performed from the skull base to thigh after the radiotracer. CT data was obtained and used for attenuation correction and anatomic localization. Fasting blood glucose: 229 mg/dl COMPARISON:  Chest CT on 03/06/2019 FINDINGS: Mediastinal blood-pool activity (background): SUV max = 2.7 Liver activity (reference): SUV max = N/A NECK: Hypermetabolic lymphadenopathy seen in the lower jugular chains bilaterally, and bilateral supraclavicular regions. Incidental CT findings:  None. CHEST: A rounded masslike opacity is seen in the central left lower lobe which measures 5.2 x 4.7 cm on image 66/4, and is hypermetabolic with SUV max of 4.9. This masslike opacity appears to obstruct the posterior left lower lobe bronchus, resulting in left lower lobe postobstructive atelectasis and pneumonitis. Another focal area of hypermetabolic activity is seen in the posteroinferior left lower lobe which measures  approximately 4 cm and has an SUV max of 6.6. This may represent another left lower lobe pulmonary mass. A moderate multiloculated left pleural effusion is seen without hypermetabolic activity. Diffuse left lung interstitial thickening is seen, suspicious for lymphangitic spread of carcinoma. Mild lymphadenopathy is seen throughout the mediastinum, bilateral hilar regions, left internal mammary chain, and left subpectoral and axillary regions, which is hypermetabolic and consistent with metastatic disease. Index lymph node in the subcarinal region measures 2.0 cm on image 61/4, with SUV max of 5.9. Incidental CT findings: Small to moderate pericardial effusion, without associated hypermetabolic activity. ABDOMEN/PELVIS: No abnormal hypermetabolic activity within the liver, pancreas, or spleen. A 2.4 cm left adrenal mass is seen which shows mild FDG uptake with SUV max of 2.8. This is suspicious for adrenal metastasis. A 1.0 cm left retrocrural lymph node is seen on image 82/4 which is hypermetabolic, with SUV max of 4.2. No other hypermetabolic lymph nodes  in the abdomen or pelvis. Incidental CT findings:  None. SKELETON: No focal hypermetabolic bone lesions to suggest skeletal metastasis. Incidental CT findings:  None. IMPRESSION: Hypermetabolic masslike opacities in the central left lower lobe and possibly the posterior-inferior left lower lobe, consistent with primary bronchogenic carcinoma. Moderate multiloculated left pleural effusion, and findings consistent with diffuse left lung lymphangitic spread of carcinoma. Hypermetabolic lymphadenopathy throughout the chest, lower neck, and left retrocrural region, consistent with metastatic disease. 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 Small to moderate pericardial effusion, without associated FDG activity. Electronically Signed   By: Marlaine Hind M.D.   On: 01/09/2020 16:04   DG CHEST PORT 1 VIEW  Result Date: 01/26/2020 CLINICAL DATA:  Post thoracentesis EXAM:  PORTABLE CHEST 1 VIEW COMPARISON:  01/25/2020 FINDINGS: Decreasing left effusion, now moderate. Improved aeration in the left lung with continued left lower lobe atelectasis or infiltrate. No pneumothorax. Right lung clear. Cardiomegaly, mild vascular congestion. No acute bony abnormality. IMPRESSION: Moderate left effusion, decreasing since prior study. No pneumothorax. Continued left lower lobe atelectasis or infiltrate. Cardiomegaly, vascular congestion. Electronically Signed   By: Rolm Baptise M.D.   On: 01/26/2020 13:16   DG Chest Portable 1 View  Result Date: 12/30/2019 CLINICAL DATA:  Post thoracentesis on LEFT EXAM: PORTABLE CHEST 1 VIEW COMPARISON:  Portable exam 1738 hours compared to 10/11 hours FINDINGS: Enlargement of cardiac silhouette with vascular congestion. Atherosclerotic calcification aorta. Peribronchial thickening. Scattered interstitial infiltrates, question pulmonary edema, accentuated by expiratory technique. Significant decrease in LEFT pleural effusion post thoracentesis, with residual atelectasis at LEFT base. No pneumothorax. IMPRESSION: No pneumothorax following LEFT thoracentesis. Electronically Signed   By: Lavonia Dana M.D.   On: 12/30/2019 17:46   DG Chest Portable 1 View  Result Date: 12/30/2019 CLINICAL DATA:  74 year old female with shortness of breath EXAM: PORTABLE CHEST 1 VIEW COMPARISON:  None. FINDINGS: Opacity of the left chest obscures the left hemidiaphragm and the left heart border with meniscus at the top of the opacity. No pneumothorax. Coarsened interstitial markings. No confluent airspace disease within the right lung. IMPRESSION: Large left pleural effusion with associated atelectasis/consolidation. Electronically Signed   By: Corrie Mckusick D.O.   On: 12/30/2019 10:21   ECHOCARDIOGRAM COMPLETE  Result Date: 01/01/2020    ECHOCARDIOGRAM REPORT   Patient Name:   CORDELL COKE Date of Exam: 01/01/2020 Medical Rec #:  [id number]      Height:       64.0 in  Accession #:    [id number]     Weight:       160.1 lb Date of Birth:  1945-06-10      BSA:          1.780 m Patient Age:    75 years       BP:           151/73 mmHg Patient Gender: F              HR:           111 bpm. Exam Location:  [facility] Procedure: 2D Echo, Cardiac Doppler and Color Doppler STAT ECHO Indications:    Pericardial effusion  History:        Patient has no prior history of Echocardiogram examinations.                 Pericardial Disease, Signs/Symptoms:Shortness of Breath and                 Large pleural effusion; Risk Factors:Hypertension and Diabetes.  Sonographer:    Dustin Flock RDCS Referring Phys: [place]  1. Left ventricular ejection fraction, by estimation, is 40 to 45%. The left ventricle has mildly decreased function. The left ventricle demonstrates global hypokinesis. There is moderate concentric left ventricular hypertrophy. Indeterminate diastolic filling due to E-A fusion.  2. Right ventricular systolic function is normal. The right ventricular size is normal. Tricuspid regurgitation signal is inadequate for assessing PA pressure.  3. A small pericardial effusion is present. The pericardial effusion is circumferential. There is no evidence of cardiac tamponade.  4. The mitral valve is normal in structure. No evidence of mitral valve regurgitation. No evidence of mitral stenosis.  5. The aortic valve is normal in structure. Aortic valve regurgitation is not visualized. No aortic stenosis is present.  6. The inferior vena cava is dilated in size with <50% respiratory variability, suggesting right atrial pressure of 15 mmHg. FINDINGS  Left Ventricle: Left ventricular ejection fraction, by estimation, is 40 to 45%. The left ventricle has mildly decreased function. The left ventricle demonstrates global hypokinesis. The left ventricular internal cavity size was normal in size. There is  moderate concentric left ventricular hypertrophy. Indeterminate  diastolic filling due to E-A fusion. Right Ventricle: The right ventricular size is normal. No increase in right ventricular wall thickness. Right ventricular systolic function is normal. Tricuspid regurgitation signal is inadequate for assessing PA pressure. Left Atrium: Left atrial size was normal in size. Right Atrium: Right atrial size was normal in size. Pericardium: A small pericardial effusion is present. The pericardial effusion is circumferential. There is no evidence of cardiac tamponade. Mitral Valve: The mitral valve is normal in structure. Mild mitral annular calcification. No evidence of mitral valve regurgitation. No evidence of mitral valve stenosis. Tricuspid Valve: The tricuspid valve is normal in structure. Tricuspid valve regurgitation is not demonstrated. No evidence of tricuspid stenosis. Aortic Valve: The aortic valve is normal in structure. Aortic valve regurgitation is not visualized. No aortic stenosis is present. Pulmonic Valve: The pulmonic valve was normal in structure. Pulmonic valve regurgitation is not visualized. No evidence of pulmonic stenosis. Aorta: The aortic root is normal in size and structure. Venous: The inferior vena cava is dilated in size with less than 50% respiratory variability, suggesting right atrial pressure of 15 mmHg. IAS/Shunts: No atrial level shunt detected by color flow Doppler.  LEFT VENTRICLE PLAX 2D LVIDd:         3.09 cm  Diastology LVIDs:         2.27 cm  LV e' medial:    4.24 cm/s LV PW:         1.65 cm  LV E/e' medial:  23.2 LV IVS:        1.67 cm  LV e' lateral:   4.68 cm/s LVOT diam:     1.80 cm  LV E/e' lateral: 21.0 LV SV:         42 LV SV Index:   23 LVOT Area:     2.54 cm  RIGHT VENTRICLE RV Basal diam:  2.77 cm RV S prime:     13.20 cm/s TAPSE (M-mode): 2.5 cm LEFT ATRIUM             Index       RIGHT ATRIUM           Index LA diam:        3.60 cm 2.02 cm/m  RA Area:     12.30 cm LA Vol (A2C):   46.0  ml 25.85 ml/m RA Volume:   28.50 ml  16.01  ml/m LA Vol (A4C):   49.0 ml 27.53 ml/m LA Biplane Vol: 52.1 ml 29.28 ml/m  AORTIC VALVE LVOT Vmax:   86.70 cm/s LVOT Vmean:  63.300 cm/s LVOT VTI:    0.164 m  AORTA Ao Root diam: 2.70 cm MITRAL VALVE MV Area (PHT): 12.04 cm    SHUNTS MV Decel Time: 63 msec      Systemic VTI:  0.16 m MV E velocity: 98.50 cm/s   Systemic Diam: 1.80 cm MV A velocity: 147.00 cm/s MV E/A ratio:  0.67 Mihai Croitoru MD Electronically signed by Sanda Klein MD Signature Date/Time: 01/01/2020/1:41:21 PM    Final    VAS [place] LOWER EXTREMITY VENOUS (DVT)  Result Date: 01/26/2020  Lower Venous DVT Study Indications: Pain, and Swelling.  Risk Factors: None identified. Limitations: Body habitus and poor ultrasound/tissue interface. Comparison Study: No prior studies. Performing Technologist: Oliver Hum RVT  Examination Guidelines: A complete evaluation includes B-mode imaging, spectral Doppler, color Doppler, and power Doppler as needed of all accessible portions of each vessel. Bilateral testing is considered an integral part of a complete examination. Limited examinations for reoccurring indications may be performed as noted. The reflux portion of the exam is performed with the patient in reverse Trendelenburg.  +---------+---------------+---------+-----------+----------+--------------+ RIGHT    CompressibilityPhasicitySpontaneityPropertiesThrombus Aging +---------+---------------+---------+-----------+----------+--------------+ CFV      Full           Yes      Yes                                 +---------+---------------+---------+-----------+----------+--------------+ SFJ      Full                                                        +---------+---------------+---------+-----------+----------+--------------+ FV Prox  Full                                                        +---------+---------------+---------+-----------+----------+--------------+ FV Mid   Full                                                         +---------+---------------+---------+-----------+----------+--------------+ FV DistalFull                                                        +---------+---------------+---------+-----------+----------+--------------+ PFV      Full                                                        +---------+---------------+---------+-----------+----------+--------------+ POP  Full           Yes      Yes                                 +---------+---------------+---------+-----------+----------+--------------+ PTV      Full                                                        +---------+---------------+---------+-----------+----------+--------------+ PERO     Full                                                        +---------+---------------+---------+-----------+----------+--------------+   +---------+---------------+---------+-----------+----------+--------------+ LEFT     CompressibilityPhasicitySpontaneityPropertiesThrombus Aging +---------+---------------+---------+-----------+----------+--------------+ CFV      Full           Yes      Yes                                 +---------+---------------+---------+-----------+----------+--------------+ SFJ      Full                                                        +---------+---------------+---------+-----------+----------+--------------+ FV Prox  Full                                                        +---------+---------------+---------+-----------+----------+--------------+ FV Mid   Full                                                        +---------+---------------+---------+-----------+----------+--------------+ FV DistalFull                                                        +---------+---------------+---------+-----------+----------+--------------+ PFV      Full                                                         +---------+---------------+---------+-----------+----------+--------------+ POP      Full           Yes      Yes                                 +---------+---------------+---------+-----------+----------+--------------+  PTV      Full                                                        +---------+---------------+---------+-----------+----------+--------------+ PERO     Full                                                        +---------+---------------+---------+-----------+----------+--------------+     Summary: RIGHT: - There is no evidence of deep vein thrombosis in the lower extremity.  - No cystic structure found in the popliteal fossa.  LEFT: - There is no evidence of deep vein thrombosis in the lower extremity.  - No cystic structure found in the popliteal fossa.  *See table(s) above for measurements and observations.    Preliminary    ECHOCARDIOGRAM LIMITED  Result Date: 01/26/2020    ECHOCARDIOGRAM LIMITED REPORT   Patient Name:   MARQUISHA NIKOLOV Date of Exam: 01/26/2020 Medical Rec #:  [id number]      Height:       64.0 in Accession #:    [id number]     Weight:       168.0 lb Date of Birth:  Apr 10, 1945      BSA:          1.817 m Patient Age:    26 years       BP:           175/125 mmHg Patient Gender: F              HR:           111 bpm. [facility] Procedure: Cardiac Doppler, Color Doppler and Limited Echo REPORT CONTAINS CRITICAL RESULT Indications:    I31.3 Pericardial effusion (noninflammatory) Respiratory                 failure.  History:        Patient has prior history of Echocardiogram examinations, most                 recent 01/01/2020. Abnormal ECG, Signs/Symptoms:Shortness of                 Breath and Dyspnea; Risk Factors:Hypertension and Diabetes.                 Cancer. Pericardial effusion. Pleural effusion.  Sonographer:    Roseanna Rainbow RDCS Referring Phys: [id number] Donita Brooks  Sonographer Comments: Technically difficult study due to poor echo  windows. IMPRESSIONS  1. Left ventricular ejection fraction, by estimation, is 55 to 60%. The left ventricle has normal function. The left ventricle has no regional wall motion abnormalities.  2. Right ventricular systolic function is normal. The right ventricular size is normal.  3. Moderate pericardial effusion surrounds heart Measurese about 14 mm in maximal dimension. Ther is mild RV/RA indentation Mitral inflow is without respiratory variation. IVC is dilated and does not change significantly with inspiration. Overall does not meet full echo criteria for tamponade. Clinical correlation indicated. Compared to previous echo, effusion is circumferential Page sent to Dr Lonny Prude. Large pleural effusion.  4. The mitral valve is abnormal. Trivial mitral valve regurgitation.  5. The aortic valve is tricuspid. Mild aortic valve sclerosis is present, with no evidence of aortic valve stenosis.  6. The inferior vena cava is dilated in size with <50% respiratory variability, suggesting right atrial pressure of 15 mmHg. FINDINGS  Left Ventricle: Left ventricular ejection fraction, by estimation, is 55 to 60%. The left ventricle has normal function. The left ventricle has no regional wall motion abnormalities. Right Ventricle: The right ventricular size is normal. Right vetricular wall thickness was not assessed. Right ventricular systolic function is normal. Right Atrium: Right atrial size was normal in size. Pericardium: Moderate pericardial effusion surrounds heart Measurese about 14 mm in maximal dimension. Ther is mild RV/RA indentation Mitral inflow is without respiratory variation. IVC is dilated and does not change significantly with inspiration. Overall does not meet full echo criteria for tamponade. Clinical correlation indicated. Compared to previous echo, effusion is circumferential Page sent to Dr Lonny Prude. The pericardial effusion is circumferential. Mitral Valve: The mitral valve is abnormal. There is mild  thickening of the mitral valve leaflet(s). Mild mitral annular calcification. Trivial mitral valve regurgitation. Tricuspid Valve: The tricuspid valve is grossly normal. Tricuspid valve regurgitation is trivial. Aortic Valve: The aortic valve is tricuspid. Mild aortic valve sclerosis is present, with no evidence of aortic valve stenosis. Aorta: The aortic root is normal in size and structure. Venous: The inferior vena cava is dilated in size with less than 50% respiratory variability, suggesting right atrial pressure of 15 mmHg. Additional Comments: There is a large pleural effusion. LEFT VENTRICLE PLAX 2D LVIDd:         2.95 cm LVIDs:         2.10 cm LV PW:         1.85 cm LV IVS:        1.35 cm LVOT diam:     1.60 cm LVOT Area:     2.01 cm  LEFT ATRIUM         Index LA diam:    2.10 cm 1.16 cm/m   AORTA Ao Root diam: 2.65 cm  SHUNTS Systemic Diam: 1.60 cm Dorris Carnes MD Electronically signed by Dorris Carnes MD Signature Date/Time: 01/26/2020/12:03:22 PM    Final    US THORACENTESIS ASP PLEURAL SPACE W/IMG GUIDE  Result Date: 01/04/2020 INDICATION: Left pleural effusion Known Non small cell lung cancer EXAM: ULTRASOUND GUIDED LEFT THORACENTESIS MEDICATIONS: 10 cc 1% lidocaine. COMPLICATIONS: None immediate. PROCEDURE: An ultrasound guided thoracentesis was thoroughly discussed with the patient and questions answered. The benefits, risks, alternatives and complications were also discussed. The patient understands and wishes to proceed with the procedure. Written consent was obtained. Ultrasound was performed to localize and mark an adequate pocket of fluid in the left chest. The area was then prepped and draped in the normal sterile fashion. 1% Lidocaine was used for local anesthesia. Under ultrasound guidance a 19 G Yueh catheter was introduced. Thoracentesis was performed. The catheter was removed and a dressing applied. FINDINGS: A total of approximately 850 cc of serosanguinous fluid was removed. Samples were  sent to the laboratory as requested by the clinical team. IMPRESSION: Successful ultrasound guided left thoracentesis yielding 850 cc of pleural fluid. Postprocedural chest radiograph showed no pneumothorax. Read by Lavonia Drafts [facility] Electronically Signed   By: Van Clines M.D.   On: 01/04/2020 09:03   US THORACENTESIS ASP PLEURAL SPACE W/IMG GUIDE  Result Date: 12/30/2019 INDICATION: LEFT pleural effusion EXAM: ULTRASOUND GUIDED DIAGNOSTIC AND THERAPEUTIC LEFT THORACENTESIS MEDICATIONS: None. COMPLICATIONS: None immediate.  PROCEDURE: An ultrasound guided thoracentesis was thoroughly discussed with the patient and questions answered. The benefits, risks, alternatives and complications were also discussed. The patient understands and wishes to proceed with the procedure. Written consent was obtained. Ultrasound was performed to localize and mark an adequate pocket of fluid in the LEFT chest. The area was then prepped and draped in the normal sterile fashion. 1% Lidocaine was used for local anesthesia. Under ultrasound guidance a 8 French thoracentesis catheter was introduced. Thoracentesis was performed. The catheter was removed and a dressing applied. FINDINGS: A total of approximately 1.08 L of cloudy yellow fluid was removed. Samples were sent to the laboratory as requested by the clinical team. IMPRESSION: Successful ultrasound guided LEFT thoracentesis yielding 1.08 L of pleural fluid. Electronically Signed   By: Lavonia Dana M.D.   On: 12/30/2019 17:42    All questions were answered. The patient knows to call the clinic with any problems, questions or concerns. Reviewed imaging from yesterday and today Improvement noted after thoracentesis.  I spent 35 minutes in the care of this patient including H and P, review of medical records, counseling and coordination of care    Sara Pike, MD 01/26/2020 1:44 PM

## 2020-01-26 NOTE — Consult Note (Signed)
Referring Physician: Lonny Prude, MD  Imonie Tuch is an 74 y.o. female.                       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 Post thoracentesis patient feels better. Her chest x-ray shows cardiomegaly with mild vascular congeation. BNP is mildly elevated. Echocardiogram shows normal LV systolic function and moderate pericardial effusion.   Past Medical History:  Diagnosis Date  . Arthritis   . Diabetes mellitus without complication ([place])   . Hypertension   . Non-small cell lung cancer ([place])    Dx 12/30/19 by thoracentesis       Past Surgical History:  Procedure Laterality Date  . ABDOMINAL HYSTERECTOMY    . ECTOPIC PREGNANCY SURGERY      History reviewed. No pertinent family history. Social History:  reports that she has never smoked. She has never used smokeless tobacco. She reports previous alcohol use. She reports that she does not use drugs.  Allergies: No Known Allergies  Medications Prior to Admission  Medication Sig Dispense Refill  . albuterol (VENTOLIN HFA) 108 (90 Base) MCG/ACT inhaler Inhale 2 puffs into the lungs every 6 (six) hours as needed for wheezing or shortness of breath. 1 each 1  . amLODipine (NORVASC) 5 MG tablet Take 1 tablet (5 mg total) by mouth daily. 30 tablet 1  . guaifenesin (ROBITUSSIN) 100 MG/5ML syrup Take 200 mg by mouth 3 (three) times daily as needed for cough.    . hydrocortisone cream 1 % Apply 1 application topically daily as needed for itching (rash).    . metoprolol tartrate (LOPRESSOR) 25 MG tablet Take 1 tablet (25 mg total) by mouth 2 (two) times daily. 60 tablet 1  . tetrahydrozoline 0.05 % ophthalmic solution Place 1 drop into both eyes daily as needed (dry, itchy eyes).    . furosemide (LASIX) 20 MG tablet Take 1  tablet (20 mg total) by mouth daily for 14 days. 14 tablet 0    Results for orders placed or performed during the hospital encounter of 01/25/20 (from the past 48 hour(s))  Brain natriuretic peptide     Status: Abnormal   Collection Time: 01/25/20 11:58 AM  Result Value Ref Range   B Natriuretic Peptide 118.3 (H) 0.0 - 100.0 pg/mL    Comment: Performed at [facility], [place] [address] [place], [place] [address]  CBC with Differential/Platelet     Status: None   Collection Time: 01/25/20 11:58 AM  Result Value Ref Range   WBC 7.0 4.0 - 10.5 K/uL   RBC 4.97 3.87 - 5.11 MIL/uL   Hemoglobin 14.2 12.0 - 15.0 g/dL   HCT 44.1 36.0 - 46.0 %   MCV 88.7 80.0 - 100.0 fL   MCH 28.6 26.0 - 34.0 pg   MCHC 32.2 30.0 - 36.0 g/dL   RDW 13.1 11.5 - 15.5 %   Platelets 275 150 - 400 K/uL   nRBC 0.0 0.0 - 0.2 %   Neutrophils Relative % 82 %   Neutro Abs 5.7 1.7 - 7.7 K/uL   Lymphocytes Relative 12 %   Lymphs Abs 0.9 0.7 - 4.0 K/uL   Monocytes Relative 5 %   Monocytes Absolute 0.4 0.1 - 1.0 K/uL   Eosinophils Relative 0 %   Eosinophils  Absolute 0.0 0.0 - 0.5 K/uL   Basophils Relative 0 %   Basophils Absolute 0.0 0.0 - 0.1 K/uL   Immature Granulocytes 1 %   Abs Immature Granulocytes 0.04 0.00 - 0.07 K/uL    Comment: Performed at [facility], [place] [address], [place], [place] [address]  Comprehensive metabolic panel     Status: Abnormal   Collection Time: 01/25/20 11:58 AM  Result Value Ref Range   Sodium 138 135 - 145 mmol/L   Potassium 3.4 (L) 3.5 - 5.1 mmol/L   Chloride 100 98 - 111 mmol/L   CO2 27 22 - 32 mmol/L   Glucose, Bld 211 (H) 70 - 99 mg/dL    Comment: Glucose reference range applies only to samples taken after fasting for at least 8 hours.   BUN 11 8 - 23 mg/dL   Creatinine, Ser 0.58 0.44 - 1.00 mg/dL   Calcium 9.4 8.9 - 10.3 mg/dL   Total Protein 8.7 (H) 6.5 - 8.1 g/dL   Albumin 3.7 3.5 - 5.0 g/dL   AST 36 15 - 41 U/L   ALT 54 (H) 0 - 44  U/L   Alkaline Phosphatase 199 (H) 38 - 126 U/L   Total Bilirubin 1.3 (H) 0.3 - 1.2 mg/dL   GFR, Estimated >60 >60 mL/min    Comment: (NOTE) Calculated using the CKD-EPI Creatinine Equation (2021)    Anion gap 11 5 - 15    Comment: Performed at [facility], [place] [address] [place], [place] [address]  Troponin I (High Sensitivity)     Status: None   Collection Time: 01/25/20 11:58 AM  Result Value Ref Range   Troponin I (High Sensitivity) 11 <18 ng/L    Comment: (NOTE) Elevated high sensitivity troponin I (hsTnI) values and significant  changes across serial measurements may suggest ACS but many other  chronic and acute conditions are known to elevate hsTnI results.  Refer to the "Links" section for chest pain algorithms and additional  guidance. Performed at [facility], [place] [address] [place], [place] [address]   Resp Panel by RT-PCR (Flu A&B, Covid) Nasopharyngeal Swab     Status: None   Collection Time: 01/25/20 11:58 AM   Specimen: Nasopharyngeal Swab; Nasopharyngeal(NP) swabs in vial transport medium  Result Value Ref Range   SARS Coronavirus 2 by RT PCR NEGATIVE NEGATIVE    Comment: (NOTE) SARS-CoV-2 target nucleic acids are NOT DETECTED.  The SARS-CoV-2 RNA is generally detectable in upper respiratory specimens during the acute phase of infection. The lowest concentration of SARS-CoV-2 viral copies this assay can detect is 138 copies/mL. A negative result does not preclude SARS-Cov-2 infection and should not be used as the sole basis for treatment or other patient management decisions. A negative result may occur with  improper specimen collection/handling, submission of specimen other than nasopharyngeal swab, presence of viral mutation(s) within the areas targeted by this assay, and inadequate number of viral copies(<138 copies/mL). A negative result must be combined with clinical observations, patient history, and  epidemiological information. The expected result is Negative.  Fact Sheet for Patients:  EntrepreneurPulse.com.au  Fact Sheet for Healthcare Providers:  IncredibleEmployment.be  This test is no t yet approved or cleared by the [place] FDA and  has been authorized for detection and/or diagnosis of SARS-CoV-2 by FDA under an Emergency Use Authorization (EUA). This EUA will remain  in effect (meaning this test can be used) for the duration of the COVID-19 declaration  under Section 564(b)(1) of the Act, 21 U.S.C.section 360bbb-3(b)(1), unless the authorization is terminated  or revoked sooner.       Influenza A by PCR NEGATIVE NEGATIVE   Influenza B by PCR NEGATIVE NEGATIVE    Comment: (NOTE) The Xpert Xpress SARS-CoV-2/FLU/RSV plus assay is intended as an aid in the diagnosis of influenza from Nasopharyngeal swab specimens and should not be used as a sole basis for treatment. Nasal washings and aspirates are unacceptable for Xpert Xpress SARS-CoV-2/FLU/RSV testing.  Fact Sheet for Patients: EntrepreneurPulse.com.au  Fact Sheet for Healthcare Providers: IncredibleEmployment.be  This test is not yet approved or cleared by the [place] FDA and has been authorized for detection and/or diagnosis of SARS-CoV-2 by FDA under an Emergency Use Authorization (EUA). This EUA will remain in effect (meaning this test can be used) for the duration of the COVID-19 declaration under Section 564(b)(1) of the Act, 21 U.S.C. section 360bbb-3(b)(1), unless the authorization is terminated or revoked.  Performed at [facility], [place] [address] [place], [place] [address]   Troponin I (High Sensitivity)     Status: None   Collection Time: 01/25/20  2:09 PM  Result Value Ref Range   Troponin I (High Sensitivity) 13 <18 ng/L    Comment: (NOTE) Elevated high sensitivity troponin I (hsTnI) values  and significant  changes across serial measurements may suggest ACS but many other  chronic and acute conditions are known to elevate hsTnI results.  Refer to the "Links" section for chest pain algorithms and additional  guidance. Performed at [facility], [place] [address], [place], [place] [address]   CBG monitoring, ED     Status: Abnormal   Collection Time: 01/25/20  4:21 PM  Result Value Ref Range   Glucose-Capillary 184 (H) 70 - 99 mg/dL    Comment: Glucose reference range applies only to samples taken after fasting for at least 8 hours.  Basic metabolic panel     Status: Abnormal   Collection Time: 01/26/20  4:55 AM  Result Value Ref Range   Sodium 135 135 - 145 mmol/L   Potassium 3.4 (L) 3.5 - 5.1 mmol/L   Chloride 100 98 - 111 mmol/L   CO2 23 22 - 32 mmol/L   Glucose, Bld 211 (H) 70 - 99 mg/dL    Comment: Glucose reference range applies only to samples taken after fasting for at least 8 hours.   BUN 12 8 - 23 mg/dL   Creatinine, Ser 0.62 0.44 - 1.00 mg/dL   Calcium 9.1 8.9 - 10.3 mg/dL   GFR, Estimated >60 >60 mL/min    Comment: (NOTE) Calculated using the CKD-EPI Creatinine Equation (2021)    Anion gap 12 5 - 15    Comment: Performed at [facility], [place] [address], [place], [place] [address]  CBC     Status: None   Collection Time: 01/26/20  4:55 AM  Result Value Ref Range   WBC 6.4 4.0 - 10.5 K/uL   RBC 4.86 3.87 - 5.11 MIL/uL   Hemoglobin 14.0 12.0 - 15.0 g/dL   HCT 42.9 36.0 - 46.0 %   MCV 88.3 80.0 - 100.0 fL   MCH 28.8 26.0 - 34.0 pg   MCHC 32.6 30.0 - 36.0 g/dL   RDW 13.0 11.5 - 15.5 %   Platelets 238 150 - 400 K/uL   nRBC 0.0 0.0 - 0.2 %    Comment: Performed at [facility], [place] [address]  [place], [place] [address]  Magnesium     Status: None   Collection Time: 01/26/20  4:55 AM  Result Value Ref Range   Magnesium 1.8 1.7 - 2.4 mg/dL    Comment: Performed at [facility],  [place] [address] [place], [place] [address]   DG Chest 2 View  Result Date: 01/25/2020 CLINICAL DATA:  Shortness of breath EXAM: CHEST - 2 VIEW COMPARISON:  01/04/2020 FINDINGS: Cardiomegaly. Near complete opacification of the left hemithorax, likely a combination of effusion and airspace disease. Only and small amount of aerated left upper lobe noted which appears to contain airspace disease. Right lung clear. No acute bony abnormality. IMPRESSION: Near complete opacification of the left hemithorax, likely a combination of pleural effusion and airspace disease. Cardiomegaly Electronically Signed   By: Rolm Baptise M.D.   On: 01/25/2020 11:52   DG CHEST PORT 1 VIEW  Result Date: 01/26/2020 CLINICAL DATA:  Post thoracentesis EXAM: PORTABLE CHEST 1 VIEW COMPARISON:  01/25/2020 FINDINGS: Decreasing left effusion, now moderate. Improved aeration in the left lung with continued left lower lobe atelectasis or infiltrate. No pneumothorax. Right lung clear. Cardiomegaly, mild vascular congestion. No acute bony abnormality. IMPRESSION: Moderate left effusion, decreasing since prior study. No pneumothorax. Continued left lower lobe atelectasis or infiltrate. Cardiomegaly, vascular congestion. Electronically Signed   By: Rolm Baptise M.D.   On: 01/26/2020 13:16   VAS [place] LOWER EXTREMITY VENOUS (DVT)  Result Date: 01/26/2020  Lower Venous DVT Study Indications: Pain, and Swelling.  Risk Factors: None identified. Limitations: Body habitus and poor ultrasound/tissue interface. Comparison Study: No prior studies. Performing Technologist: Oliver Hum RVT  Examination Guidelines: A complete evaluation includes B-mode imaging, spectral Doppler, color Doppler, and power Doppler as needed of all accessible portions of each vessel. Bilateral testing is considered an integral part of a complete examination. Limited examinations for reoccurring indications may be performed as noted. The reflux portion of the exam is  performed with the patient in reverse Trendelenburg.  +---------+---------------+---------+-----------+----------+--------------+ RIGHT    CompressibilityPhasicitySpontaneityPropertiesThrombus Aging +---------+---------------+---------+-----------+----------+--------------+ CFV      Full           Yes      Yes                                 +---------+---------------+---------+-----------+----------+--------------+ SFJ      Full                                                        +---------+---------------+---------+-----------+----------+--------------+ FV Prox  Full                                                        +---------+---------------+---------+-----------+----------+--------------+ FV Mid   Full                                                        +---------+---------------+---------+-----------+----------+--------------+ FV DistalFull                                                        +---------+---------------+---------+-----------+----------+--------------+  PFV      Full                                                        +---------+---------------+---------+-----------+----------+--------------+ POP      Full           Yes      Yes                                 +---------+---------------+---------+-----------+----------+--------------+ PTV      Full                                                        +---------+---------------+---------+-----------+----------+--------------+ PERO     Full                                                        +---------+---------------+---------+-----------+----------+--------------+   +---------+---------------+---------+-----------+----------+--------------+ LEFT     CompressibilityPhasicitySpontaneityPropertiesThrombus Aging +---------+---------------+---------+-----------+----------+--------------+ CFV      Full           Yes      Yes                                  +---------+---------------+---------+-----------+----------+--------------+ SFJ      Full                                                        +---------+---------------+---------+-----------+----------+--------------+ FV Prox  Full                                                        +---------+---------------+---------+-----------+----------+--------------+ FV Mid   Full                                                        +---------+---------------+---------+-----------+----------+--------------+ FV DistalFull                                                        +---------+---------------+---------+-----------+----------+--------------+ PFV      Full                                                        +---------+---------------+---------+-----------+----------+--------------+  POP      Full           Yes      Yes                                 +---------+---------------+---------+-----------+----------+--------------+ PTV      Full                                                        +---------+---------------+---------+-----------+----------+--------------+ PERO     Full                                                        +---------+---------------+---------+-----------+----------+--------------+     Summary: RIGHT: - There is no evidence of deep vein thrombosis in the lower extremity.  - No cystic structure found in the popliteal fossa.  LEFT: - There is no evidence of deep vein thrombosis in the lower extremity.  - No cystic structure found in the popliteal fossa.  *See table(s) above for measurements and observations. Electronically signed by Monica Martinez MD on 01/26/2020 at 4:56:52 PM.    Final    ECHOCARDIOGRAM LIMITED  Result Date: 01/26/2020    ECHOCARDIOGRAM LIMITED REPORT   Patient Name:   Sara Ferguson Date of Exam: 01/26/2020 Medical Rec #:  [id number]      Height:       64.0 in Accession #:    [id number]     Weight:        168.0 lb Date of Birth:  04-27-1945      BSA:          1.817 m Patient Age:    43 years       BP:           175/125 mmHg Patient Gender: F              HR:           111 bpm. Exam Location:  Inpatient Procedure: Cardiac Doppler, Color Doppler and Limited Echo REPORT CONTAINS CRITICAL RESULT Indications:    I31.3 Pericardial effusion (noninflammatory) Respiratory                 failure.  History:        Patient has prior history of Echocardiogram examinations, most                 recent 01/01/2020. Abnormal ECG, Signs/Symptoms:Shortness of                 Breath and Dyspnea; Risk Factors:Hypertension and Diabetes.                 Cancer. Pericardial effusion. Pleural effusion.  Sonographer:    Roseanna Rainbow RDCS Referring Phys: [id number] Donita Brooks  Sonographer Comments: Technically difficult study due to poor echo windows. IMPRESSIONS  1. Left ventricular ejection fraction, by estimation, is 55 to 60%. The left ventricle has normal function. The left ventricle has no regional wall motion abnormalities.  2. Right ventricular systolic function is normal. The right ventricular size is normal.  3. Moderate pericardial effusion surrounds  heart Measurese about 14 mm in maximal dimension. Ther is mild RV/RA indentation Mitral inflow is without respiratory variation. IVC is dilated and does not change significantly with inspiration. Overall does not meet full echo criteria for tamponade. Clinical correlation indicated. Compared to previous echo, effusion is circumferential Page sent to Dr Lonny Prude. Large pleural effusion.  4. The mitral valve is abnormal. Trivial mitral valve regurgitation.  5. The aortic valve is tricuspid. Mild aortic valve sclerosis is present, with no evidence of aortic valve stenosis.  6. The inferior vena cava is dilated in size with <50% respiratory variability, suggesting right atrial pressure of 15 mmHg. FINDINGS  Left Ventricle: Left ventricular ejection fraction, by estimation, is 55 to 60%.  The left ventricle has normal function. The left ventricle has no regional wall motion abnormalities. Right Ventricle: The right ventricular size is normal. Right vetricular wall thickness was not assessed. Right ventricular systolic function is normal. Right Atrium: Right atrial size was normal in size. Pericardium: Moderate pericardial effusion surrounds heart Measurese about 14 mm in maximal dimension. Ther is mild RV/RA indentation Mitral inflow is without respiratory variation. IVC is dilated and does not change significantly with inspiration. Overall does not meet full echo criteria for tamponade. Clinical correlation indicated. Compared to previous echo, effusion is circumferential Page sent to Dr Lonny Prude. The pericardial effusion is circumferential. Mitral Valve: The mitral valve is abnormal. There is mild thickening of the mitral valve leaflet(s). Mild mitral annular calcification. Trivial mitral valve regurgitation. Tricuspid Valve: The tricuspid valve is grossly normal. Tricuspid valve regurgitation is trivial. Aortic Valve: The aortic valve is tricuspid. Mild aortic valve sclerosis is present, with no evidence of aortic valve stenosis. Aorta: The aortic root is normal in size and structure. Venous: The inferior vena cava is dilated in size with less than 50% respiratory variability, suggesting right atrial pressure of 15 mmHg. Additional Comments: There is a large pleural effusion. LEFT VENTRICLE PLAX 2D LVIDd:         2.95 cm LVIDs:         2.10 cm LV PW:         1.85 cm LV IVS:        1.35 cm LVOT diam:     1.60 cm LVOT Area:     2.01 cm  LEFT ATRIUM         Index LA diam:    2.10 cm 1.16 cm/m   AORTA Ao Root diam: 2.65 cm  SHUNTS Systemic Diam: 1.60 cm Dorris Carnes MD Electronically signed by Dorris Carnes MD Signature Date/Time: 01/26/2020/12:03:22 PM    Final     Review Of Systems Constitutional: No fever, chills, weight loss or gain. Eyes: No vision change, wears glasses. No discharge or  pain. Ears: No hearing loss, No tinnitus. Respiratory: No asthma, COPD, pneumonias. Positive shortness of breath. No hemoptysis. Cardiovascular: Positive chest pain, palpitation, leg edema. Gastrointestinal: No nausea, vomiting, diarrhea, constipation. No GI bleed. No hepatitis. Genitourinary: No dysuria, hematuria, kidney stone. No incontinance. Neurological: No headache, stroke, seizures.  Psychiatry: No psych facility admission for anxiety, depression, suicide. No detox. Skin: No rash. Musculoskeletal: Positive joint pain, fibromyalgia. No neck pain, back pain. Lymphadenopathy: No lymphadenopathy. Hematology: No anemia or easy bruising.   Blood pressure (!) 145/58, pulse 98, temperature 98.5 F (36.9 C), temperature source Oral, resp. rate 16, height 5\' 4"  (1.626 m), weight 76.2 kg, SpO2 94 %. Body mass index is 28.84 kg/m. General appearance: alert, cooperative, appears stated age and no distress Head:  Normocephalic, atraumatic. Eyes: Brown eyes, pink conjunctiva, corneas clear. PERRL, EOM's intact. Neck: No adenopathy, no carotid bruit, no JVD, supple, symmetrical, trachea midline and thyroid not enlarged. Resp: Clearing to auscultation bilaterally. Cardio: Regular rate and rhythm, S1, S2 normal, II/VI systolic murmur, no click, rub or gallop GI: Soft, non-tender; bowel sounds normal; no organomegaly. Extremities: 1 + lower leg edema, no cyanosis or clubbing. Skin: Warm and dry.  Neurologic: Alert and oriented X 3, normal strength. Normal coordination.  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 Gentle diuresis as tolerated. May need pericardiocentesis if develops tamponade.   Time spent: Review of old records, Lab, x-rays, EKG, other cardiac tests, examination, discussion with patient over 70  minutes.  Birdie Riddle, MD  01/26/2020, 7:13 PM

## 2020-01-26 NOTE — ED Notes (Signed)
Pt. Placed on external urinary catheter.

## 2020-01-26 NOTE — Telephone Encounter (Signed)
Per Dr. Rob Hickman request scheduled weekly thoracentesis for patient starting next week and scheduled out thru 03/01/2020.  Patient will also be required to have COVID screening completed every week per IR and those are scheduled also.  Was unable to schedule 02/09/2020 at this time which will need follow up as IR schedule is blocked.  Called niece to relay appointment details, left message pending call back.

## 2020-01-26 NOTE — Progress Notes (Signed)
  Echocardiogram 2D Echocardiogram has been performed.  Bobbye Charleston 01/26/2020, 11:25 AM

## 2020-01-27 ENCOUNTER — Ambulatory Visit (HOSPITAL_COMMUNITY): Payer: Self-pay

## 2020-01-27 DIAGNOSIS — Z9889 Other specified postprocedural states: Secondary | ICD-10-CM

## 2020-01-27 LAB — BASIC METABOLIC PANEL
Anion gap: 10 (ref 5–15)
BUN: 17 mg/dL (ref 8–23)
CO2: 27 mmol/L (ref 22–32)
Calcium: 9.1 mg/dL (ref 8.9–10.3)
Chloride: 101 mmol/L (ref 98–111)
Creatinine, Ser: 0.75 mg/dL (ref 0.44–1.00)
GFR, Estimated: >60 mL/min (ref >60)
Glucose, Bld: 191 mg/dL — ABNORMAL HIGH (ref 70–99)
Potassium: 3.5 mmol/L (ref 3.5–5.1)
Sodium: 138 mmol/L (ref 135–145)

## 2020-01-27 LAB — CBC
HCT: 41.3 % (ref 36.0–46.0)
Hemoglobin: 13 g/dL (ref 12.0–15.0)
MCH: 28 pg (ref 26.0–34.0)
MCHC: 31.5 g/dL (ref 30.0–36.0)
MCV: 89 fL (ref 80.0–100.0)
Platelets: 289 10*3/uL (ref 150–400)
RBC: 4.64 MIL/uL (ref 3.87–5.11)
RDW: 13.2 % (ref 11.5–15.5)
WBC: 6.5 10*3/uL (ref 4.0–10.5)
nRBC: 0 % (ref 0.0–0.2)

## 2020-01-27 MED ORDER — FUROSEMIDE 20 MG PO TABS
20.0000 mg | ORAL_TABLET | Freq: Every day | ORAL | Status: DC
  Administered 2020-01-27 – 2020-01-28 (×2): 20 mg via ORAL
  Filled 2020-01-27 (×2): qty 1

## 2020-01-27 MED ORDER — COLCHICINE 0.6 MG PO TABS
0.6000 mg | ORAL_TABLET | Freq: Two times a day (BID) | ORAL | Status: DC
  Administered 2020-01-27 – 2020-01-28 (×2): 0.6 mg via ORAL
  Filled 2020-01-27 (×2): qty 1

## 2020-01-27 NOTE — Discharge Summary (Signed)
Sara Ferguson GYJ:[id number] DOB: [birth year] DOA: 01/25/2020  PCP: [name], MD  Admit date: 01/25/2020 Discharge date: 01/28/2020  Admitted From: Home Disposition:  Home  Recommendations for Outpatient Follow-up:  1. Follow up with PCP in 1-2 weeks  2. Instructed to continue her home dose of oral Lasix as well as BP medications 3. Prescribed colchicine for pericardial effusion, will follow up with Dr. [name] in 2 weeks 4. Have close follow-up with her oncologist Dr. [name] to start chemotherapy 5.   Home Health:none  Equipment/Devices:none   Discharge Condition:Stable  CODE STATUS:FULL    Brief/Interim Summary: History of present illness:  Sara Ferguson is a 74 y.o. year old female with medical history significant for HTN, diabetes, recent diagnosed adenocarcinoma of the lung who presented on 01/25/2020 with worsening shortness of breath over the past few days and lower extremity swelling and was found to have increased size of malignant pleural effusion with increasing pericardial effusion and hypertensive urgency.   Acute hypoxic respiratory failure secondary to malignant left pleural effusion, resolved    On admission initially required 3 L oxygen chest x-ray showed left pleural effusion with near complete opacification currently on 3 L O2 previous home regimen is no O2 requirements.  Status post thoracentesis chest x-ray shows decreasing size left effusion moderate in size, likely concern for atelectasis versus infiltrate as well as mild vascular congestion , Patient underwent thoracentesis on 12/9 with 1500 cc of amber appearing fluid remained for the left pleural space.  Patient underwent postthoracentesis chest x-ray which showed decreasing left effusion and left lower lobe atelectasis.  Patient was able to transition off of her O2.  Patient underwent ambulatory O2 test and s able to maintain SPO2 92% on room air even with exertion.  Cytology from pleural fluid on left side  suggested metastatic adenocarcinoma -We will hold off on Pleurx catheter as recommended by oncology as they believe targeted chemotherapy patient will have excellent response --has close follow up with oncology arranged   Malignant pericardial effusion, enlarging.  Last TTE in November showed small size, now moderate on TTE.  No evidence of cardiac tamponade on TTE and no clinical evidence of tamponade during admission.  Patient was evaluated by cardiology who agreed -Cardiology recommends continuing gentle diuresis with lasix and colchicine.  --Outpatient cardiology followup arranged with Dr. [name] -Treatment of lung cancer by oncology may be able to improve this  Metastatic non-small cell cancer of the lung, status post thoracentesis on 12/9. -Her oncologist plans on starting starting chemo therapy next week on 12/13 -Patient unable to get outpatient brain MRI for staging due to being in hospital and unfortunately can not get brain MRI at [facility] unless urgent over the weekend. This will have to be pushed back to later in discussion with her oncologist.   Bilateral lower extremity swelling, likely in setting of acute diastolic CHF exacerbation.  Venous duplex negative for DVT.  Responded well to IV Lasix dose x1.  And tolerated transition back to home oral regimen -Continue Lasix 20 mg daily  Hypertensive urgency, resolved.  In the setting of poor blood pressure medication adherence also further complicated by volume overload and effusions (small pleural and pericardial).  With reinitiation of her home BP meds blood pressure peaked at 200s improved to 140s-150s.  Would rather she run a bit high given concurrent pericardial effusion -continue amlodipine, metoprolol -Ensured patient has medications at home   Consultations:  PCCM, Oncology, Cardiology  Procedures/Studies: TTE, 12/9  1. Left ventricular ejection fraction,  by estimation, is 55 to 60%. The  left ventricle has normal function.  The left ventricle has no regional  wall motion abnormalities.  2. Right ventricular systolic function is normal. The right ventricular  size is normal.  3. Moderate pericardial effusion surrounds heart Measurese about 14 mm in  maximal dimension. Ther is mild RV/RA indentation Mitral inflow is without  respiratory variation. IVC is dilated and does not change significantly  with inspiration. Overall does  not meet full echo criteria for tamponade. Clinical correlation indicated.  Compared to previous echo, effusion is circumferential Page sent to Dr  Lonny Prude. Large pleural effusion.  4. The mitral valve is abnormal. Trivial mitral valve regurgitation.  5. The aortic valve is tricuspid. Mild aortic valve sclerosis is present,  with no evidence of aortic valve stenosis.  6. The inferior vena cava is dilated in size with <50% respiratory  variability, suggesting right atrial pressure of 15 mmHg.  Venous duplex 12/9   RIGHT:  - There is no evidence of deep vein thrombosis in the lower extremity.    - No cystic structure found in the popliteal fossa.    LEFT:  - There is no evidence of deep vein thrombosis in the lower extremity.    - No cystic structure found in the popliteal fossa.     Subjective:  Discharge Exam: Vitals:   01/27/20 2032 01/28/20 0529  BP: (!) 185/65 (!) 159/72  Pulse: (!) 102 95  Resp: 18 18  Temp: 98.6 F (37 C) 98.5 F (36.9 C)  SpO2: 92% 91%   Vitals:   01/27/20 1430 01/27/20 1800 01/27/20 2032 01/28/20 0529  BP: (!) 189/91 (!) 182/76 (!) 185/65 (!) 159/72  Pulse: (!) 103  (!) 102 95  Resp: 20  18 18   Temp: 99 F (37.2 C)  98.6 F (37 C) 98.5 F (36.9 C)  TempSrc: Oral  Oral Oral  SpO2: 93%  92% 91%  Weight:      Height:        General: Sitting in bedside chair, no distress Eyes: EOMI, anicteric ENT: Oral Mucosa clear and moist Cardiovascular: regular rate and rhythm, 1+  pitting edema of bilateral lower extremities to both  ankles Respiratory: Normal respiratory effort on room air, clear breath sounds bilaterally though R>L, normal air movement  Abdomen: soft, non-distended, non-tender, normal bowel sounds Skin: No Rash Neurologic: Grossly no focal neuro deficit.Mental status AAOx3, speech normal, Psychiatric:Appropriate affect, and mood  Discharge Diagnoses:  Principal Problem:   Acute respiratory failure with hypoxia ([place]) Active Problems:   Essential hypertension   Pleural effusion, left   Type 2 diabetes mellitus without complication (HCC)   Adenocarcinoma of left lung, stage 4 (HCC)   Hypokalemia   Volume overload    Discharge Instructions  Discharge Instructions    Diet - low sodium heart healthy   Complete by: As directed    Increase activity slowly   Complete by: As directed      Allergies as of 01/28/2020   No Known Allergies     Medication List    TAKE these medications   albuterol 108 (90 Base) MCG/ACT inhaler Commonly known as: VENTOLIN HFA Inhale 2 puffs into the lungs every 6 (six) hours as needed for wheezing or shortness of breath.   amLODipine 5 MG tablet Commonly known as: NORVASC Take 1 tablet (5 mg total) by mouth daily.   colchicine 0.6 MG tablet Take 1 tablet (0.6 mg total) by mouth 2 (two) times daily.  furosemide 20 MG tablet Commonly known as: LASIX Take 1 tablet (20 mg total) by mouth daily for 14 days.   guaifenesin 100 MG/5ML syrup Commonly known as: ROBITUSSIN Take 200 mg by mouth 3 (three) times daily as needed for cough.   hydrocortisone cream 1 % Apply 1 application topically daily as needed for itching (rash).   metoprolol tartrate 25 MG tablet Commonly known as: LOPRESSOR Take 1 tablet (25 mg total) by mouth 2 (two) times daily.   tetrahydrozoline 0.05 % ophthalmic solution Place 1 drop into both eyes daily as needed (dry, itchy eyes).       Follow-up Information    Dixie Dials, MD. Schedule an appointment as soon as possible for a  visit in 2 week(s).   Specialty: Cardiology Contact information: [place] [address] [phone]              No Known Allergies      The results of significant diagnostics from this hospitalization (including imaging, microbiology, ancillary and laboratory) are listed below for reference.     Microbiology: Recent Results (from the past 240 hour(s))  Resp Panel by RT-PCR (Flu A&B, Covid) Nasopharyngeal Swab     Status: None   Collection Time: 01/25/20 11:58 AM   Specimen: Nasopharyngeal Swab; Nasopharyngeal(NP) swabs in vial transport medium  Result Value Ref Range Status   SARS Coronavirus 2 by RT PCR NEGATIVE NEGATIVE Final    Comment: (NOTE) SARS-CoV-2 target nucleic acids are NOT DETECTED.  The SARS-CoV-2 RNA is generally detectable in upper respiratory specimens during the acute phase of infection. The lowest concentration of SARS-CoV-2 viral copies this assay can detect is 138 copies/mL. A negative result does not preclude SARS-Cov-2 infection and should not be used as the sole basis for treatment or other patient management decisions. A negative result may occur with  improper specimen collection/handling, submission of specimen other than nasopharyngeal swab, presence of viral mutation(s) within the areas targeted by this assay, and inadequate number of viral copies(<138 copies/mL). A negative result must be combined with clinical observations, patient history, and epidemiological information. The expected result is Negative.  Fact Sheet for Patients:  EntrepreneurPulse.com.au  Fact Sheet for Healthcare Providers:  IncredibleEmployment.be  This test is no t yet approved or cleared by the [place] FDA and  has been authorized for detection and/or diagnosis of SARS-CoV-2 by FDA under an Emergency Use Authorization (EUA). This EUA will remain  in effect (meaning this test can be used) for the  duration of the COVID-19 declaration under Section 564(b)(1) of the Act, 21 U.S.C.section 360bbb-3(b)(1), unless the authorization is terminated  or revoked sooner.       Influenza A by PCR NEGATIVE NEGATIVE Final   Influenza B by PCR NEGATIVE NEGATIVE Final    Comment: (NOTE) The Xpert Xpress SARS-CoV-2/FLU/RSV plus assay is intended as an aid in the diagnosis of influenza from Nasopharyngeal swab specimens and should not be used as a sole basis for treatment. Nasal washings and aspirates are unacceptable for Xpert Xpress SARS-CoV-2/FLU/RSV testing.  Fact Sheet for Patients: EntrepreneurPulse.com.au  Fact Sheet for Healthcare Providers: IncredibleEmployment.be  This test is not yet approved or cleared by the [place] FDA and has been authorized for detection and/or diagnosis of SARS-CoV-2 by FDA under an Emergency Use Authorization (EUA). This EUA will remain in effect (meaning this test can be used) for the duration of the COVID-19 declaration under Section 564(b)(1) of the Act, 21 U.S.C. section 360bbb-3(b)(1), unless the  authorization is terminated or revoked.  Performed at [facility], [place] [address] [place], [place] [address]      Labs: BNP (last 3 results) Recent Labs    01/01/20 0617 01/25/20 1158  BNP 55.0 888.9*   Basic Metabolic Panel: Recent Labs  Lab 01/25/20 1158 01/26/20 0455 01/27/20 0634 01/28/20 0644 01/28/20 0823  NA 138 135 138 138  --   K 3.4* 3.4* 3.5 3.1*  --   CL 100 100 101 103  --   CO2 27 23 27 23   --   GLUCOSE 211* 211* 191* 113*  --   BUN 11 12 17 14   --   CREATININE 0.58 0.62 0.75 0.64  --   CALCIUM 9.4 9.1 9.1 8.8*  --   MG  --  1.8  --   --  1.9   Liver Function Tests: Recent Labs  Lab 01/25/20 1158  AST 36  ALT 54*  ALKPHOS 199*  BILITOT 1.3*  PROT 8.7*  ALBUMIN 3.7   No results for input(s): LIPASE, AMYLASE in the last 168 hours. No results for  input(s): AMMONIA in the last 168 hours. CBC: Recent Labs  Lab 01/25/20 1158 01/26/20 0455 01/27/20 0634 01/28/20 0644  WBC 7.0 6.4 6.5 6.1  NEUTROABS 5.7  --   --   --   HGB 14.2 14.0 13.0 12.6  HCT 44.1 42.9 41.3 40.0  MCV 88.7 88.3 89.0 89.7  PLT 275 238 289 271   Cardiac Enzymes: No results for input(s): CKTOTAL, CKMB, CKMBINDEX, TROPONINI in the last 168 hours. BNP: Invalid input(s): POCBNP CBG: Recent Labs  Lab 01/25/20 1621 01/26/20 2003 01/26/20 2102  GLUCAP 184* 295* 280*   D-Dimer No results for input(s): DDIMER in the last 72 hours. Hgb A1c No results for input(s): HGBA1C in the last 72 hours. Lipid Profile No results for input(s): CHOL, HDL, LDLCALC, TRIG, CHOLHDL, LDLDIRECT in the last 72 hours. Thyroid function studies No results for input(s): TSH, T4TOTAL, T3FREE, THYROIDAB in the last 72 hours.  Invalid input(s): FREET3 Anemia work up No results for input(s): VITAMINB12, FOLATE, FERRITIN, TIBC, IRON, RETICCTPCT in the last 72 hours. Urinalysis No results found for: COLORURINE, APPEARANCEUR, [place], [place], GLUCOSEU, [place], [place], [place], PROTEINUR, UROBILINOGEN, NITRITE, LEUKOCYTESUR Sepsis Labs Invalid input(s): PROCALCITONIN,  WBC,  LACTICIDVEN Microbiology Recent Results (from the past 240 hour(s))  Resp Panel by RT-PCR (Flu A&B, Covid) Nasopharyngeal Swab     Status: None   Collection Time: 01/25/20 11:58 AM   Specimen: Nasopharyngeal Swab; Nasopharyngeal(NP) swabs in vial transport medium  Result Value Ref Range Status   SARS Coronavirus 2 by RT PCR NEGATIVE NEGATIVE Final    Comment: (NOTE) SARS-CoV-2 target nucleic acids are NOT DETECTED.  The SARS-CoV-2 RNA is generally detectable in upper respiratory specimens during the acute phase of infection. The lowest concentration of SARS-CoV-2 viral copies this assay can detect is 138 copies/mL. A negative result does not preclude SARS-Cov-2 infection and should not be used as the  sole basis for treatment or other patient management decisions. A negative result may occur with  improper specimen collection/handling, submission of specimen other than nasopharyngeal swab, presence of viral mutation(s) within the areas targeted by this assay, and inadequate number of viral copies(<138 copies/mL). A negative result must be combined with clinical observations, patient history, and epidemiological information. The expected result is Negative.  Fact Sheet for Patients:  EntrepreneurPulse.com.au  Fact Sheet for Healthcare Providers:  IncredibleEmployment.be  This test is no t yet  approved or cleared by the [place] and  has been authorized for detection and/or diagnosis of SARS-CoV-2 by FDA under an Emergency Use Authorization (EUA). This EUA will remain  in effect (meaning this test can be used) for the duration of the COVID-19 declaration under Section 564(b)(1) of the Act, 21 U.S.C.section 360bbb-3(b)(1), unless the authorization is terminated  or revoked sooner.       Influenza A by PCR NEGATIVE NEGATIVE Final   Influenza B by PCR NEGATIVE NEGATIVE Final    Comment: (NOTE) The Xpert Xpress SARS-CoV-2/FLU/RSV plus assay is intended as an aid in the diagnosis of influenza from Nasopharyngeal swab specimens and should not be used as a sole basis for treatment. Nasal washings and aspirates are unacceptable for Xpert Xpress SARS-CoV-2/FLU/RSV testing.  Fact Sheet for Patients: EntrepreneurPulse.com.au  Fact Sheet for Healthcare Providers: IncredibleEmployment.be  This test is not yet approved or cleared by the [place] FDA and has been authorized for detection and/or diagnosis of SARS-CoV-2 by FDA under an Emergency Use Authorization (EUA). This EUA will remain in effect (meaning this test can be used) for the duration of the COVID-19 declaration under Section 564(b)(1) of the  Act, 21 U.S.C. section 360bbb-3(b)(1), unless the authorization is terminated or revoked.  Performed at [facility], [place] [address], [place], [place] [address]      Time coordinating discharge: Over 30 minutes  SIGNED:   Desiree Hane, MD  [facility] 01/28/2020, 1:41 PM Pager   If 7PM-7AM, please contact night-coverage [email or web address] Password [email or web address]

## 2020-01-27 NOTE — Progress Notes (Signed)
[facility]  PROGRESS NOTE  Elynn Patteson EHU:[id number] DOB: [birth year] DOA: 01/25/2020 PCP: [name], MD Admit date - 01/25/2020   Admitting Physician Harold Hedge, MD  Outpatient Primary MD for the patient is [name], MD  LOS - 2 Brief Narrative   Mija Effertz is a 74 y.o. year old female with medical history significant for HTN, diabetes, recent diagnosed adenocarcinoma of the lung who presented on 01/25/2020 with worsening shortness of breath over the past few days and lower extremity swelling and was found to have increased size and malignant pleural effusion with increasing pericardial effusion and hypertensive urgency.   Subjective  Feels much better. No chest pain. Breathing better A & P  Acute hypoxic respiratory failure secondary to malignant left pleural effusion, resolved   On admission initially required 3 L oxygen chest x-ray showed left pleural effusion with near complete opacification currently on 3 L O2 previous home regimen is no O2 requirements. Status post thoracentesis chest x-ray shows decreasing size left effusion moderate in size, likely concern for atelectasis versus infiltrate as well as mild vascular congestion , Patient underwent thoracentesis on 12/9 with 1500 cc of amber appearing fluid remained for the left pleural space.  Patient underwent postthoracentesis chest x-ray which showed decreasing left effusion and left lower lobe atelectasis.  Patient was able to transition off of her O2.  Patient underwent ambulatory O2 test and s able to maintain SPO2 92% on room air even with exertion.  Cytology from pleural fluid on left side suggested metastatic adenocarcinoma -We will hold off on Pleurx catheter as recommended by oncology as they believe targeted chemotherapy patient will have excellent response --has close follow up with oncology arranged   Malignant pericardial effusion, enlarging.  Last TTE in November showed small size, now  moderate on TTE.  No evidence of cardiac tamponade on TTE and no clinical evidence of tamponade.  Patient was evaluated by cardiology who agreed -Appreciate cardiology recommendations,Cardiology recommends continuing gentle diuresis with oral lasix and monitoring to ensure no clinical decompensation while in hospital -Treatment of lung cancer by oncology may be able to improve this  Metastatic non-small cell cancer of the lung, status post thoracentesis on 12/9. -Her oncologist plans on starting starting chemo therapy next week on 12/13 -Patient also to undergo staging MRI brain on 12/11, since in hospital will order here  Bilateral lower extremity swelling, likely in setting of acute diastolic CHF exacerbation.  Venous duplex negative for DVT.  Responded well to IV Lasix dose x1.  And transition back to home oral regimen -Resume home Lasix 20 mg daily and monitor  Hypertensive urgency, resolved.  In the setting of poor blood pressure medication adherence also further complicated by volume overload and effusions (small pleural and pericardial).  With reinitiation of her home BP meds blood pressure peaked at 200s improved to 140s-150s -continue amlodipine, metoprolol -Ensured patient has medications at home    Family Communication  : None  Code Status : Full  Disposition Plan  :  Patient is from home. Anticipated d/c date: 2 to 3 days. Barriers to d/c or necessity for inpatient status:  Close monitoring of blood pressure, close monitoring respiratory status, close monitoring of pericardial effusion while on oral diuresis to ensure no clinical decompensation given risk for tamponade Consults  : Oncology, PCCM Procedures  : Thoracentesis 12/9  DVT Prophylaxis  :  Lovenox   MDM: The below labs and imaging reports were reviewed and summarized above.  Medication management as  above.  Lab Results  Component Value Date   PLT 289 01/27/2020    Diet :  Diet Order            Diet heart  healthy/carb modified Room service appropriate? Yes; Fluid consistency: Thin  Diet effective now                  Inpatient Medications Scheduled Meds: . amLODipine  5 mg Oral Daily  . enoxaparin (LOVENOX) injection  40 mg Subcutaneous Q24H  . insulin glargine  5 Units Subcutaneous Daily  . metoprolol tartrate  25 mg Oral BID  . sodium chloride flush  3 mL Intravenous Q12H   Continuous Infusions: . sodium chloride Stopped (01/25/20 2107)   PRN Meds:.acetaminophen **OR** acetaminophen, albuterol, hydrALAZINE, HYDROcodone-acetaminophen, morphine injection, polyethylene glycol  Antibiotics  :   Anti-infectives (From admission, onward)   None       Objective   Vitals:   01/26/20 2308 01/27/20 0418 01/27/20 0621 01/27/20 1430  BP: (!) 170/57 (!) 150/67  (!) 189/91  Pulse: 96 88  (!) 103  Resp: 20 20  20   Temp: 98.3 F (36.8 C) 97.7 F (36.5 C)  99 F (37.2 C)  TempSrc: Oral Oral  Oral  SpO2: 93% 93%  93%  Weight:   72.8 kg   Height:        SpO2: 93 % O2 Flow Rate (L/min): 2 L/min  Wt Readings from Last 3 Encounters:  01/27/20 72.8 kg  01/13/20 76.2 kg  12/30/19 72.6 kg     Intake/Output Summary (Last 24 hours) at 01/27/2020 1700 Last data filed at 01/27/2020 1000 Gross per 24 hour  Intake 483 ml  Output 351 ml  Net 132 ml    Physical Exam:     Awake Alert, Oriented X 3, Normal affect No new F.N deficits,  [place].AT, No conversational dyspnea, normal respiratory effort on room air, improved breath sounds bilaterally +ve B.Sounds, Abd Soft, No tenderness, No rebound, guarding or rigidity. 1+ pitting edema bilateral lower extremities to ankles  I have personally reviewed the following:   Data Reviewed:  CBC Recent Labs  Lab 01/25/20 1158 01/26/20 0455 01/27/20 0634  WBC 7.0 6.4 6.5  HGB 14.2 14.0 13.0  HCT 44.1 42.9 41.3  PLT 275 238 289  MCV 88.7 88.3 89.0  MCH 28.6 28.8 28.0  MCHC 32.2 32.6 31.5  RDW 13.1 13.0 13.2  LYMPHSABS 0.9  --    --   MONOABS 0.4  --   --   EOSABS 0.0  --   --   BASOSABS 0.0  --   --     Chemistries  Recent Labs  Lab 01/25/20 1158 01/26/20 0455 01/27/20 0634  NA 138 135 138  K 3.4* 3.4* 3.5  CL 100 100 101  CO2 27 23 27   GLUCOSE 211* 211* 191*  BUN 11 12 17   CREATININE 0.58 0.62 0.75  CALCIUM 9.4 9.1 9.1  MG  --  1.8  --   AST 36  --   --   ALT 54*  --   --   ALKPHOS 199*  --   --   BILITOT 1.3*  --   --    ------------------------------------------------------------------------------------------------------------------ No results for input(s): CHOL, HDL, LDLCALC, TRIG, CHOLHDL, LDLDIRECT in the last 72 hours.  Lab Results  Component Value Date   HGBA1C 10.7 (H) 12/30/2019   ------------------------------------------------------------------------------------------------------------------ No results for input(s): TSH, T4TOTAL, T3FREE, THYROIDAB in the last 72 hours.  Invalid  input(s): FREET3 ------------------------------------------------------------------------------------------------------------------ No results for input(s): VITAMINB12, FOLATE, FERRITIN, TIBC, IRON, RETICCTPCT in the last 72 hours.  Coagulation profile No results for input(s): INR, PROTIME in the last 168 hours.  No results for input(s): DDIMER in the last 72 hours.  Cardiac Enzymes No results for input(s): CKMB, TROPONINI, MYOGLOBIN in the last 168 hours.  Invalid input(s): CK ------------------------------------------------------------------------------------------------------------------    Component Value Date/Time   BNP 118.3 (H) 01/25/2020 1158    Micro Results Recent Results (from the past 240 hour(s))  Resp Panel by RT-PCR (Flu A&B, Covid) Nasopharyngeal Swab     Status: None   Collection Time: 01/25/20 11:58 AM   Specimen: Nasopharyngeal Swab; Nasopharyngeal(NP) swabs in vial transport medium  Result Value Ref Range Status   SARS Coronavirus 2 by RT PCR NEGATIVE NEGATIVE Final     Comment: (NOTE) SARS-CoV-2 target nucleic acids are NOT DETECTED.  The SARS-CoV-2 RNA is generally detectable in upper respiratory specimens during the acute phase of infection. The lowest concentration of SARS-CoV-2 viral copies this assay can detect is 138 copies/mL. A negative result does not preclude SARS-Cov-2 infection and should not be used as the sole basis for treatment or other patient management decisions. A negative result may occur with  improper specimen collection/handling, submission of specimen other than nasopharyngeal swab, presence of viral mutation(s) within the areas targeted by this assay, and inadequate number of viral copies(<138 copies/mL). A negative result must be combined with clinical observations, patient history, and epidemiological information. The expected result is Negative.  Fact Sheet for Patients:  EntrepreneurPulse.com.au  Fact Sheet for Healthcare Providers:  IncredibleEmployment.be  This test is no t yet approved or cleared by the [place] FDA and  has been authorized for detection and/or diagnosis of SARS-CoV-2 by FDA under an Emergency Use Authorization (EUA). This EUA will remain  in effect (meaning this test can be used) for the duration of the COVID-19 declaration under Section 564(b)(1) of the Act, 21 U.S.C.section 360bbb-3(b)(1), unless the authorization is terminated  or revoked sooner.       Influenza A by PCR NEGATIVE NEGATIVE Final   Influenza B by PCR NEGATIVE NEGATIVE Final    Comment: (NOTE) The Xpert Xpress SARS-CoV-2/FLU/RSV plus assay is intended as an aid in the diagnosis of influenza from Nasopharyngeal swab specimens and should not be used as a sole basis for treatment. Nasal washings and aspirates are unacceptable for Xpert Xpress SARS-CoV-2/FLU/RSV testing.  Fact Sheet for Patients: EntrepreneurPulse.com.au  Fact Sheet for Healthcare  Providers: IncredibleEmployment.be  This test is not yet approved or cleared by the [place] FDA and has been authorized for detection and/or diagnosis of SARS-CoV-2 by FDA under an Emergency Use Authorization (EUA). This EUA will remain in effect (meaning this test can be used) for the duration of the COVID-19 declaration under Section 564(b)(1) of the Act, 21 U.S.C. section 360bbb-3(b)(1), unless the authorization is terminated or revoked.  Performed at [facility], [place] [address] [place], [place] [address]     Radiology Reports DG Chest 1 View  Result Date: 01/04/2020 CLINICAL DATA:  Left thoracentesis, pleural effusions EXAM: CHEST  1 VIEW COMPARISON:  01/02/2020 FINDINGS: Notable reduction in size of the left pleural effusion. No significant pneumothorax. Stable enlargement of the cardiopericardial silhouette. Atherosclerotic calcification of the aortic arch. Bony demineralization. IMPRESSION: 1. Notable reduction in size of the left pleural effusion, status post thoracentesis. No significant pneumothorax. 2. Stable enlargement of the cardiopericardial silhouette. Electronically Signed   By: Thayer Jew  Janeece Fitting M.D.   On: 01/04/2020 09:08   DG Chest 2 View  Result Date: 01/25/2020 CLINICAL DATA:  Shortness of breath EXAM: CHEST - 2 VIEW COMPARISON:  01/04/2020 FINDINGS: Cardiomegaly. Near complete opacification of the left hemithorax, likely a combination of effusion and airspace disease. Only and small amount of aerated left upper lobe noted which appears to contain airspace disease. Right lung clear. No acute bony abnormality. IMPRESSION: Near complete opacification of the left hemithorax, likely a combination of pleural effusion and airspace disease. Cardiomegaly Electronically Signed   By: Rolm Baptise M.D.   On: 01/25/2020 11:52   DG Chest 2 View  Result Date: 01/02/2020 CLINICAL DATA:  Shortness of breath. Follow-up left pleural  effusion. EXAM: CHEST - 2 VIEW COMPARISON:  12/30/2019 FINDINGS: Cardiomegaly remains stable. Previously seen diffuse interstitial infiltrates have resolved. Increased consolidation is seen in the left lower lobe since prior exam. Small left pleural effusion cannot be excluded. IMPRESSION: Increased left lower lobe consolidation. Small left pleural effusion cannot be excluded. Stable cardiomegaly. Resolution of previously seen diffuse interstitial infiltrates. Electronically Signed   By: Marlaine Hind M.D.   On: 01/02/2020 11:22   CT CHEST W CONTRAST  Result Date: 01/04/2020 CLINICAL DATA:  Non-small cell lung cancer. Patient underwent thoracentesis today. EXAM: CT CHEST WITH CONTRAST TECHNIQUE: Multidetector CT imaging of the chest was performed during intravenous contrast administration. CONTRAST:  4mL OMNIPAQUE IOHEXOL 300 MG/ML  SOLN COMPARISON:  Abdominopelvic CT 12/31/2019.  Chest CT 12/30/2019. FINDINGS: Cardiovascular: Moderate atherosclerosis of the aorta, great vessels and coronary arteries. No acute vascular findings are seen. There is suboptimal opacification of the pulmonary arteries without evidence of acute pulmonary embolism. Mild cardiomegaly and small to moderate pericardial effusion are again noted. Mediastinum/Nodes: Again demonstrated is extensive confluent mediastinal and hilar adenopathy, similar to previous CT. Right paratracheal node measures approximately 3.3 x 2.9 cm on image 37/2. AP window node measures 3.0 x 3.6 cm on image 37/2. There is a subcarinal node measuring 2.4 cm short axis on image 52/2. Mildly enlarged left axillary lymph nodes are stable, measuring up to 1.6 cm on image 32/2. The thyroid gland, trachea and esophagus demonstrate no significant findings. Lungs/Pleura: The left pleural effusion has decreased in volume and appears partially loculated. There is a new small dependent right pleural effusion. No pneumothorax. The overall aeration of the left lung has improved  with a residual left infrahilar mass-like density measuring up to 5.8 x 5.1 cm on image 60/6. There is diffuse central airway thickening in both lungs with narrowing of the left lower lobe central bronchi. Upper abdomen: Again demonstrated is a left adrenal nodule measuring 2.9 x 2.1 cm, suspicious for a metastasis. There is no right adrenal nodule. A nodular density projecting medial to the lower pole of the right kidney on image 28/3 likely represents bowel based on recent prior abdominal CT. No liver lesions are seen. Musculoskeletal/Chest wall: There is no chest wall mass or suspicious osseous finding. Mild degenerative changes throughout the spine. IMPRESSION: 1. Interval decreased volume of left pleural effusion following thoracentesis. No pneumothorax. 2. Improved aeration of the left lung with residual left infrahilar mass-like density, possibly reflecting the primary bronchogenic carcinoma. 3. Grossly stable left axillary, confluent mediastinal and hilar adenopathy consistent with metastatic disease. 4. Stable left adrenal nodule, suspicious for metastasis. 5. New small dependent right pleural effusion. Stable pericardial effusion. 6. Aortic Atherosclerosis (ICD10-I70.0). Electronically Signed   By: Richardean Sale M.D.   On: 01/04/2020 20:20   CT  Chest W Contrast  Result Date: 12/30/2019 CLINICAL DATA:  Large left pleural effusion on chest x-ray. EXAM: CT CHEST WITH CONTRAST TECHNIQUE: Multidetector CT imaging of the chest was performed during intravenous contrast administration. CONTRAST:  8mL OMNIPAQUE IOHEXOL 300 MG/ML  SOLN COMPARISON:  Chest x-ray earlier today. FINDINGS: Cardiovascular: Heart is enlarged. Small to moderate pericardial effusion. Atherosclerotic calcification is noted in the wall of the thoracic aorta. Mediastinum/Nodes: Supraclavicular, thoracic inlet, mediastinal, left internal mammary and bilateral hilar lymphadenopathy evident. Index precarinal lymph node measures 2.5 cm  short axis on image 44/series 3. Subcarinal lymph node measures 2.0 cm short axis on 63/3. 1.6 cm short axis right hilar node visible on 57/3 1.6 cm soft tissue nodule in the anterior left hilum is probably a lymph node (image 50/3). Tiny hiatal hernia. The esophagus has normal imaging features. No right axillary lymphadenopathy. 13 mm short axis left subpectoral node is visible on 28/3 with mild left axillary lymphadenopathy evident. Lungs/Pleura: Large left pleural effusion has anterior loculated component. Some aerated lung noted left upper lobe with otherwise near complete collapse of the left upper and lower lobes. Multiple small enhancing pleural nodules are seen in the posterior left hemithorax (paraspinal on 70/3, posterior on 107/3, and posterior on 113/3. Upper Abdomen: 2.4 cm left adrenal nodule cannot be definitively characterized. No definite focal lesion within the visualized liver. Musculoskeletal: No worrisome lytic or sclerotic osseous abnormality. No obvious left breast mass although the entire left breast has not been included in the field of view. IMPRESSION: 1. Large left pleural effusion has anterior loculated component and near complete collapse of the left upper and lower lobes. 2. Supraclavicular, mediastinal, bilateral hilar, and left subpectoral/axillary lymphadenopathy consistent with metastatic disease. No primary lesion evident although left lung cancer or left breast cancer (left breast incompletely visualized on this study) would be distinct considerations. 3. 2.4 cm left adrenal nodule cannot be definitively characterized. Metastatic disease a concern. 4. Small to moderate pericardial effusion. 5. Aortic Atherosclerosis (ICD10-I70.0). Electronically Signed   By: Misty Stanley M.D.   On: 12/30/2019 14:29   CT ABDOMEN PELVIS W CONTRAST  Result Date: 12/31/2019 CLINICAL DATA:  Cancer of unknown primary. EXAM: CT ABDOMEN AND PELVIS WITH CONTRAST TECHNIQUE: Multidetector CT imaging  of the abdomen and pelvis was performed using the standard protocol following bolus administration of intravenous contrast. CONTRAST:  52mL OMNIPAQUE IOHEXOL 300 MG/ML  SOLN COMPARISON:  December 30, 2019 FINDINGS: Lower chest: Moderate pericardial effusion. Partial visualization of heterogeneously enhancing LEFT lower lung opacities as well as a LEFT pleural effusion. 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 Trace RIGHT pleural effusion. Mild interlobular septal thickening likely reflecting underlying pulmonary edema. Hepatobiliary: Focal fatty deposition adjacent to the falciform ligament. Excreted contrast within the gallbladder. No intrahepatic or extrahepatic biliary ductal dilation. Portal veins are patent. Pancreas: Unremarkable. No pancreatic ductal dilatation or surrounding inflammatory changes. Spleen: Normal in size without focal abnormality. Adrenals/Urinary Tract: There is a 2.4 cm LEFT adrenal nodule. RIGHT adrenal gland is unremarkable. No hydronephrosis. Kidneys enhance symmetrically. Punctate nonobstructive RIGHT-sided nephrolithiasis. Bladder is unremarkable. Stomach/Bowel: Metallic clips within the pelvis the level of the rectum and LEFT pelvic sidewall. Small hiatal hernia. No focal or diffuse bowel wall thickening. Appendix is normal. No evidence of bowel obstruction. Moderate colonic stool burden. Vascular/Lymphatic: There is a 11 mm likely lymph node posterior to the LEFT diaphragmatic crus (series 2, image 10). There is a prominent LEFT perinephric lymph node  which measures 8 mm in the short axis (series 2, image 25). Moderate atherosclerotic calcifications throughout the aorta. Reproductive: Status post hysterectomy. No adnexal masses. Other: Trace free fluid. Musculoskeletal: Osteopenia. Multilevel degenerative changes of the thoracolumbar spine. A LEFT-sided pars defect at L3-4. Degenerative changes of the RIGHT  greater than LEFT hip. IMPRESSION: 1. No evidence of primary intra-abdominal or intrapelvic malignancy. Partial visualization of heterogeneously enhancing LEFT lower lung opacities as well as a LEFT pleural effusion. 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 Findings may reflect underlying primary pulmonary malignancy versus metastatic disease. 2. There is a 2.4 cm LEFT adrenal nodule. Findings are concerning for metastatic disease. This could be further evaluated with dedicated adrenal protocol CT or MRI if clinically indicated. 3. Prominent lymph node posterior to the LEFT diaphragmatic crus and prominent LEFT perinephric lymph node which are nonspecific but likely reflect nodal metastatic disease. 4. Moderate pericardial effusion. 5. Small RIGHT pleural effusion and likely pulmonary edema. Aortic Atherosclerosis (ICD10-I70.0). Electronically Signed   By: Valentino Saxon MD   On: 12/31/2019 13:54   NM PET Image Initial (PI) Skull Base To Thigh  Result Date: 01/09/2020 CLINICAL DATA:  Initial treatment strategy for non-small cell lung carcinoma. EXAM: NUCLEAR MEDICINE PET SKULL BASE TO THIGH TECHNIQUE: 7.6 mCi F-18 FDG was injected intravenously. Full-ring PET imaging was performed from the skull base to thigh after the radiotracer. CT data was obtained and used for attenuation correction and anatomic localization. Fasting blood glucose: 229 mg/dl COMPARISON:  Chest CT on 03/06/2019 FINDINGS: Mediastinal blood-pool activity (background): SUV max = 2.7 Liver activity (reference): SUV max = N/A NECK: Hypermetabolic lymphadenopathy seen in the lower jugular chains bilaterally, and bilateral supraclavicular regions. Incidental CT findings:  None. CHEST: A rounded masslike opacity is seen in the central left lower lobe which measures 5.2 x 4.7 cm on image 66/4, and is hypermetabolic with SUV max of 4.9. This masslike opacity appears to  obstruct the posterior left lower lobe bronchus, resulting in left lower lobe postobstructive atelectasis and pneumonitis. Another focal area of hypermetabolic activity is seen in the posteroinferior left lower lobe which measures approximately 4 cm and has an SUV max of 6.6. This may represent another left lower lobe pulmonary mass. A moderate multiloculated left pleural effusion is seen without hypermetabolic activity. Diffuse left lung interstitial thickening is seen, suspicious for lymphangitic spread of carcinoma. Mild lymphadenopathy is seen throughout the mediastinum, bilateral hilar regions, left internal mammary chain, and left subpectoral and axillary regions, which is hypermetabolic and consistent with metastatic disease. Index lymph node in the subcarinal region measures 2.0 cm on image 61/4, with SUV max of 5.9. Incidental CT findings: Small to moderate pericardial effusion, without associated hypermetabolic activity. ABDOMEN/PELVIS: No abnormal hypermetabolic activity within the liver, pancreas, or spleen. A 2.4 cm left adrenal mass is seen which shows mild FDG uptake with SUV max of 2.8. This is suspicious for adrenal metastasis. A 1.0 cm left retrocrural lymph node is seen on image 82/4 which is hypermetabolic, with SUV max of 4.2. No other hypermetabolic lymph nodes in the abdomen or pelvis. Incidental CT findings:  None. SKELETON: No focal hypermetabolic bone lesions to suggest skeletal metastasis. Incidental CT findings:  None. IMPRESSION: Hypermetabolic masslike opacities in the central left lower lobe and possibly the posterior-inferior left lower lobe, consistent with primary bronchogenic carcinoma. Moderate multiloculated left pleural effusion, and findings consistent with diffuse left lung lymphangitic spread of carcinoma. Hypermetabolic lymphadenopathy throughout the  chest, lower neck, and left retrocrural region, consistent with metastatic disease. 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 Small to  moderate pericardial effusion, without associated FDG activity. Electronically Signed   By: Marlaine Hind M.D.   On: 01/09/2020 16:04   DG CHEST PORT 1 VIEW  Result Date: 01/26/2020 CLINICAL DATA:  Post thoracentesis EXAM: PORTABLE CHEST 1 VIEW COMPARISON:  01/25/2020 FINDINGS: Decreasing left effusion, now moderate. Improved aeration in the left lung with continued left lower lobe atelectasis or infiltrate. No pneumothorax. Right lung clear. Cardiomegaly, mild vascular congestion. No acute bony abnormality. IMPRESSION: Moderate left effusion, decreasing since prior study. No pneumothorax. Continued left lower lobe atelectasis or infiltrate. Cardiomegaly, vascular congestion. Electronically Signed   By: Rolm Baptise M.D.   On: 01/26/2020 13:16   DG Chest Portable 1 View  Result Date: 12/30/2019 CLINICAL DATA:  Post thoracentesis on LEFT EXAM: PORTABLE CHEST 1 VIEW COMPARISON:  Portable exam 1738 hours compared to 10/11 hours FINDINGS: Enlargement of cardiac silhouette with vascular congestion. Atherosclerotic calcification aorta. Peribronchial thickening. Scattered interstitial infiltrates, question pulmonary edema, accentuated by expiratory technique. Significant decrease in LEFT pleural effusion post thoracentesis, with residual atelectasis at LEFT base. No pneumothorax. IMPRESSION: No pneumothorax following LEFT thoracentesis. Electronically Signed   By: Lavonia Dana M.D.   On: 12/30/2019 17:46   DG Chest Portable 1 View  Result Date: 12/30/2019 CLINICAL DATA:  74 year old female with shortness of breath EXAM: PORTABLE CHEST 1 VIEW COMPARISON:  None. FINDINGS: Opacity of the left chest obscures the left hemidiaphragm and the left heart border with meniscus at the top of the opacity. No pneumothorax. Coarsened interstitial markings. No confluent airspace disease within the right lung. IMPRESSION: Large left pleural effusion with associated atelectasis/consolidation. Electronically Signed   By: Corrie Mckusick D.O.   On: 12/30/2019 10:21   ECHOCARDIOGRAM COMPLETE  Result Date: 01/01/2020    ECHOCARDIOGRAM REPORT   Patient Name:   LESTINE RAHE Date of Exam: 01/01/2020 Medical Rec #:  [id number]      Height:       64.0 in Accession #:    [id number]     Weight:       160.1 lb Date of Birth:  25-Mar-1945      BSA:          1.780 m Patient Age:    86 years       BP:           151/73 mmHg Patient Gender: F              HR:           111 bpm. Exam Location:  [facility] Procedure: 2D Echo, Cardiac Doppler and Color Doppler STAT ECHO Indications:    Pericardial effusion  History:        Patient has no prior history of Echocardiogram examinations.                 Pericardial Disease, Signs/Symptoms:Shortness of Breath and                 Large pleural effusion; Risk Factors:Hypertension and Diabetes.  Sonographer:    Dustin Flock RDCS Referring Phys: [place]  1. Left ventricular ejection fraction, by estimation, is 40 to 45%. The left ventricle has mildly decreased function. The left ventricle demonstrates global hypokinesis. There is moderate concentric left ventricular hypertrophy. Indeterminate diastolic filling due to E-A fusion.  2. Right ventricular systolic function is normal. The right ventricular size is normal. Tricuspid  regurgitation signal is inadequate for assessing PA pressure.  3. A small pericardial effusion is present. The pericardial effusion is circumferential. There is no evidence of cardiac tamponade.  4. The mitral valve is normal in structure. No evidence of mitral valve regurgitation. No evidence of mitral stenosis.  5. The aortic valve is normal in structure. Aortic valve regurgitation is not visualized. No aortic stenosis is present.  6. The inferior vena cava is dilated in size with <50% respiratory variability, suggesting right atrial pressure of 15 mmHg. FINDINGS  Left Ventricle: Left ventricular ejection fraction, by estimation, is 40 to 45%. The left ventricle  has mildly decreased function. The left ventricle demonstrates global hypokinesis. The left ventricular internal cavity size was normal in size. There is  moderate concentric left ventricular hypertrophy. Indeterminate diastolic filling due to E-A fusion. Right Ventricle: The right ventricular size is normal. No increase in right ventricular wall thickness. Right ventricular systolic function is normal. Tricuspid regurgitation signal is inadequate for assessing PA pressure. Left Atrium: Left atrial size was normal in size. Right Atrium: Right atrial size was normal in size. Pericardium: A small pericardial effusion is present. The pericardial effusion is circumferential. There is no evidence of cardiac tamponade. Mitral Valve: The mitral valve is normal in structure. Mild mitral annular calcification. No evidence of mitral valve regurgitation. No evidence of mitral valve stenosis. Tricuspid Valve: The tricuspid valve is normal in structure. Tricuspid valve regurgitation is not demonstrated. No evidence of tricuspid stenosis. Aortic Valve: The aortic valve is normal in structure. Aortic valve regurgitation is not visualized. No aortic stenosis is present. Pulmonic Valve: The pulmonic valve was normal in structure. Pulmonic valve regurgitation is not visualized. No evidence of pulmonic stenosis. Aorta: The aortic root is normal in size and structure. Venous: The inferior vena cava is dilated in size with less than 50% respiratory variability, suggesting right atrial pressure of 15 mmHg. IAS/Shunts: No atrial level shunt detected by color flow Doppler.  LEFT VENTRICLE PLAX 2D LVIDd:         3.09 cm  Diastology LVIDs:         2.27 cm  LV e' medial:    4.24 cm/s LV PW:         1.65 cm  LV E/e' medial:  23.2 LV IVS:        1.67 cm  LV e' lateral:   4.68 cm/s LVOT diam:     1.80 cm  LV E/e' lateral: 21.0 LV SV:         42 LV SV Index:   23 LVOT Area:     2.54 cm  RIGHT VENTRICLE RV Basal diam:  2.77 cm RV S prime:      13.20 cm/s TAPSE (M-mode): 2.5 cm LEFT ATRIUM             Index       RIGHT ATRIUM           Index LA diam:        3.60 cm 2.02 cm/m  RA Area:     12.30 cm LA Vol (A2C):   46.0 ml 25.85 ml/m RA Volume:   28.50 ml  16.01 ml/m LA Vol (A4C):   49.0 ml 27.53 ml/m LA Biplane Vol: 52.1 ml 29.28 ml/m  AORTIC VALVE LVOT Vmax:   86.70 cm/s LVOT Vmean:  63.300 cm/s LVOT VTI:    0.164 m  AORTA Ao Root diam: 2.70 cm MITRAL VALVE MV Area (PHT): 12.04 cm  SHUNTS MV Decel Time: 63 msec      Systemic VTI:  0.16 m MV E velocity: 98.50 cm/s   Systemic Diam: 1.80 cm MV A velocity: 147.00 cm/s MV E/A ratio:  0.67 Mihai Croitoru MD Electronically signed by Sanda Klein MD Signature Date/Time: 01/01/2020/1:41:21 PM    Final    VAS [place] LOWER EXTREMITY VENOUS (DVT)  Result Date: 01/26/2020  Lower Venous DVT Study Indications: Pain, and Swelling.  Risk Factors: None identified. Limitations: Body habitus and poor ultrasound/tissue interface. Comparison Study: No prior studies. Performing Technologist: Oliver Hum RVT  Examination Guidelines: A complete evaluation includes B-mode imaging, spectral Doppler, color Doppler, and power Doppler as needed of all accessible portions of each vessel. Bilateral testing is considered an integral part of a complete examination. Limited examinations for reoccurring indications may be performed as noted. The reflux portion of the exam is performed with the patient in reverse Trendelenburg.  +---------+---------------+---------+-----------+----------+--------------+ RIGHT    CompressibilityPhasicitySpontaneityPropertiesThrombus Aging +---------+---------------+---------+-----------+----------+--------------+ CFV      Full           Yes      Yes                                 +---------+---------------+---------+-----------+----------+--------------+ SFJ      Full                                                         +---------+---------------+---------+-----------+----------+--------------+ FV Prox  Full                                                        +---------+---------------+---------+-----------+----------+--------------+ FV Mid   Full                                                        +---------+---------------+---------+-----------+----------+--------------+ FV DistalFull                                                        +---------+---------------+---------+-----------+----------+--------------+ PFV      Full                                                        +---------+---------------+---------+-----------+----------+--------------+ POP      Full           Yes      Yes                                 +---------+---------------+---------+-----------+----------+--------------+ PTV      Full                                                        +---------+---------------+---------+-----------+----------+--------------+  PERO     Full                                                        +---------+---------------+---------+-----------+----------+--------------+   +---------+---------------+---------+-----------+----------+--------------+ LEFT     CompressibilityPhasicitySpontaneityPropertiesThrombus Aging +---------+---------------+---------+-----------+----------+--------------+ CFV      Full           Yes      Yes                                 +---------+---------------+---------+-----------+----------+--------------+ SFJ      Full                                                        +---------+---------------+---------+-----------+----------+--------------+ FV Prox  Full                                                        +---------+---------------+---------+-----------+----------+--------------+ FV Mid   Full                                                         +---------+---------------+---------+-----------+----------+--------------+ FV DistalFull                                                        +---------+---------------+---------+-----------+----------+--------------+ PFV      Full                                                        +---------+---------------+---------+-----------+----------+--------------+ POP      Full           Yes      Yes                                 +---------+---------------+---------+-----------+----------+--------------+ PTV      Full                                                        +---------+---------------+---------+-----------+----------+--------------+ PERO     Full                                                        +---------+---------------+---------+-----------+----------+--------------+  Summary: RIGHT: - There is no evidence of deep vein thrombosis in the lower extremity.  - No cystic structure found in the popliteal fossa.  LEFT: - There is no evidence of deep vein thrombosis in the lower extremity.  - No cystic structure found in the popliteal fossa.  *See table(s) above for measurements and observations. Electronically signed by Monica Martinez MD on 01/26/2020 at 4:56:52 PM.    Final    ECHOCARDIOGRAM LIMITED  Result Date: 01/26/2020    ECHOCARDIOGRAM LIMITED REPORT   Patient Name:   VERMELLE CAMMARATA Date of Exam: 01/26/2020 Medical Rec #:  [id number]      Height:       64.0 in Accession #:    [id number]     Weight:       168.0 lb Date of Birth:  27-Mar-1945      BSA:          1.817 m Patient Age:    23 years       BP:           175/125 mmHg Patient Gender: F              HR:           111 bpm. Exam Location:  [facility] Procedure: Cardiac Doppler, Color Doppler and Limited Echo REPORT CONTAINS CRITICAL RESULT Indications:    I31.3 Pericardial effusion (noninflammatory) Respiratory                 failure.  History:        Patient has prior history of Echocardiogram  examinations, most                 recent 01/01/2020. Abnormal ECG, Signs/Symptoms:Shortness of                 Breath and Dyspnea; Risk Factors:Hypertension and Diabetes.                 Cancer. Pericardial effusion. Pleural effusion.  Sonographer:    Roseanna Rainbow RDCS Referring Phys: [id number] Donita Brooks  Sonographer Comments: Technically difficult study due to poor echo windows. IMPRESSIONS  1. Left ventricular ejection fraction, by estimation, is 55 to 60%. The left ventricle has normal function. The left ventricle has no regional wall motion abnormalities.  2. Right ventricular systolic function is normal. The right ventricular size is normal.  3. Moderate pericardial effusion surrounds heart Measurese about 14 mm in maximal dimension. Ther is mild RV/RA indentation Mitral inflow is without respiratory variation. IVC is dilated and does not change significantly with inspiration. Overall does not meet full echo criteria for tamponade. Clinical correlation indicated. Compared to previous echo, effusion is circumferential Page sent to Dr Lonny Prude. Large pleural effusion.  4. The mitral valve is abnormal. Trivial mitral valve regurgitation.  5. The aortic valve is tricuspid. Mild aortic valve sclerosis is present, with no evidence of aortic valve stenosis.  6. The inferior vena cava is dilated in size with <50% respiratory variability, suggesting right atrial pressure of 15 mmHg. FINDINGS  Left Ventricle: Left ventricular ejection fraction, by estimation, is 55 to 60%. The left ventricle has normal function. The left ventricle has no regional wall motion abnormalities. Right Ventricle: The right ventricular size is normal. Right vetricular wall thickness was not assessed. Right ventricular systolic function is normal. Right Atrium: Right atrial size was normal in size. Pericardium: Moderate pericardial effusion surrounds heart Measurese about 14 mm in maximal dimension. Ther is mild RV/RA indentation Mitral  inflow is  without respiratory variation. IVC is dilated and does not change significantly with inspiration. Overall does not meet full echo criteria for tamponade. Clinical correlation indicated. Compared to previous echo, effusion is circumferential Page sent to Dr Lonny Prude. The pericardial effusion is circumferential. Mitral Valve: The mitral valve is abnormal. There is mild thickening of the mitral valve leaflet(s). Mild mitral annular calcification. Trivial mitral valve regurgitation. Tricuspid Valve: The tricuspid valve is grossly normal. Tricuspid valve regurgitation is trivial. Aortic Valve: The aortic valve is tricuspid. Mild aortic valve sclerosis is present, with no evidence of aortic valve stenosis. Aorta: The aortic root is normal in size and structure. Venous: The inferior vena cava is dilated in size with less than 50% respiratory variability, suggesting right atrial pressure of 15 mmHg. Additional Comments: There is a large pleural effusion. LEFT VENTRICLE PLAX 2D LVIDd:         2.95 cm LVIDs:         2.10 cm LV PW:         1.85 cm LV IVS:        1.35 cm LVOT diam:     1.60 cm LVOT Area:     2.01 cm  LEFT ATRIUM         Index LA diam:    2.10 cm 1.16 cm/m   AORTA Ao Root diam: 2.65 cm  SHUNTS Systemic Diam: 1.60 cm Dorris Carnes MD Electronically signed by Dorris Carnes MD Signature Date/Time: 01/26/2020/12:03:22 PM    Final    US THORACENTESIS ASP PLEURAL SPACE W/IMG GUIDE  Result Date: 01/04/2020 INDICATION: Left pleural effusion Known Non small cell lung cancer EXAM: ULTRASOUND GUIDED LEFT THORACENTESIS MEDICATIONS: 10 cc 1% lidocaine. COMPLICATIONS: None immediate. PROCEDURE: An ultrasound guided thoracentesis was thoroughly discussed with the patient and questions answered. The benefits, risks, alternatives and complications were also discussed. The patient understands and wishes to proceed with the procedure. Written consent was obtained. Ultrasound was performed to localize and mark an adequate pocket of  fluid in the left chest. The area was then prepped and draped in the normal sterile fashion. 1% Lidocaine was used for local anesthesia. Under ultrasound guidance a 19 G Yueh catheter was introduced. Thoracentesis was performed. The catheter was removed and a dressing applied. FINDINGS: A total of approximately 850 cc of serosanguinous fluid was removed. Samples were sent to the laboratory as requested by the clinical team. IMPRESSION: Successful ultrasound guided left thoracentesis yielding 850 cc of pleural fluid. Postprocedural chest radiograph showed no pneumothorax. Read by Lavonia Drafts [facility] Electronically Signed   By: Van Clines M.D.   On: 01/04/2020 09:03   US THORACENTESIS ASP PLEURAL SPACE W/IMG GUIDE  Result Date: 12/30/2019 INDICATION: LEFT pleural effusion EXAM: ULTRASOUND GUIDED DIAGNOSTIC AND THERAPEUTIC LEFT THORACENTESIS MEDICATIONS: None. COMPLICATIONS: None immediate. PROCEDURE: An ultrasound guided thoracentesis was thoroughly discussed with the patient and questions answered. The benefits, risks, alternatives and complications were also discussed. The patient understands and wishes to proceed with the procedure. Written consent was obtained. Ultrasound was performed to localize and mark an adequate pocket of fluid in the LEFT chest. The area was then prepped and draped in the normal sterile fashion. 1% Lidocaine was used for local anesthesia. Under ultrasound guidance a 8 French thoracentesis catheter was introduced. Thoracentesis was performed. The catheter was removed and a dressing applied. FINDINGS: A total of approximately 1.08 L of cloudy yellow fluid was removed. Samples were sent to the laboratory as requested by the clinical team.  IMPRESSION: Successful ultrasound guided LEFT thoracentesis yielding 1.08 L of pleural fluid. Electronically Signed   By: Lavonia Dana M.D.   On: 12/30/2019 17:42     Time Spent in minutes  30     Desiree Hane M.D on 01/27/2020 at 5:00  PM  To page go to [email or web address] - password [email or web address]

## 2020-01-27 NOTE — Consult Note (Signed)
Ref: Sara Pike, MD   Subjective:  Feeling better. Leg edema persist. Mild sinus tachycardia and mild respiratory distress  Objective:  Vital Signs in the last 24 hours: Temp:  [97.7 F (36.5 C)-99 F (37.2 C)] 99 F (37.2 C) (12/10 1430) Pulse Rate:  [88-103] 103 (12/10 1430) Cardiac Rhythm: Sinus tachycardia (12/10 0843) Resp:  [20] 20 (12/10 1430) BP: (150-189)/(57-91) 189/91 (12/10 1430) SpO2:  [93 %-95 %] 93 % (12/10 1430) Weight:  [72.8 kg] 72.8 kg (12/10 2951)  Physical Exam: BP Readings from Last 1 Encounters:  01/27/20 (!) 189/91     Wt Readings from Last 1 Encounters:  01/27/20 72.8 kg    Weight change: -3.404 kg Body mass index is 27.55 kg/m. HEENT: [place]/AT, Eyes-Brown, PERL, EOMI, Conjunctiva-Pink, Sclera-Non-icteric Neck: No JVD, No bruit, Trachea midline. Lungs:  Clear, Bilateral. Cardiac:  Regular rhythm, normal S1 and S2, no S3. II/VI systolic murmur. Abdomen:  Soft, non-tender. BS present. Extremities:  1 + edema present. No cyanosis. No clubbing. CNS: AxOx3, Cranial nerves grossly intact, moves all 4 extremities.  Skin: Warm and dry.   Intake/Output from previous day: 12/09 0701 - 12/10 0700 In: 243 [P.O.:240; I.V.:3] Out: 1 [Urine:1]    Lab Results: BMET    Component Value Date/Time   NA 138 01/27/2020 0634   NA 135 01/26/2020 0455   NA 138 01/25/2020 1158   K 3.5 01/27/2020 0634   K 3.4 (L) 01/26/2020 0455   K 3.4 (L) 01/25/2020 1158   CL 101 01/27/2020 0634   CL 100 01/26/2020 0455   CL 100 01/25/2020 1158   CO2 27 01/27/2020 0634   CO2 23 01/26/2020 0455   CO2 27 01/25/2020 1158   GLUCOSE 191 (H) 01/27/2020 0634   GLUCOSE 211 (H) 01/26/2020 0455   GLUCOSE 211 (H) 01/25/2020 1158   BUN 17 01/27/2020 0634   BUN 12 01/26/2020 0455   BUN 11 01/25/2020 1158   CREATININE 0.75 01/27/2020 0634   CREATININE 0.62 01/26/2020 0455   CREATININE 0.58 01/25/2020 1158   CALCIUM 9.1 01/27/2020 0634   CALCIUM 9.1 01/26/2020 0455   CALCIUM  9.4 01/25/2020 1158   GFRNONAA >60 01/27/2020 0634   GFRNONAA >60 01/26/2020 0455   GFRNONAA >60 01/25/2020 1158   CBC    Component Value Date/Time   WBC 6.5 01/27/2020 0634   RBC 4.64 01/27/2020 0634   HGB 13.0 01/27/2020 0634   HCT 41.3 01/27/2020 0634   PLT 289 01/27/2020 0634   MCV 89.0 01/27/2020 0634   MCH 28.0 01/27/2020 0634   MCHC 31.5 01/27/2020 0634   RDW 13.2 01/27/2020 0634   LYMPHSABS 0.9 01/25/2020 1158   MONOABS 0.4 01/25/2020 1158   EOSABS 0.0 01/25/2020 1158   BASOSABS 0.0 01/25/2020 1158   HEPATIC Function Panel Recent Labs    12/30/19 1646 12/31/19 0656 01/25/20 1158  PROT 8.1 7.4 8.7*   HEMOGLOBIN A1C No components found for: HGA1C,  MPG CARDIAC ENZYMES No results found for: CKTOTAL, CKMB, CKMBINDEX, TROPONINI BNP No results for input(s): PROBNP in the last 8760 hours. TSH No results for input(s): TSH in the last 8760 hours. CHOLESTEROL No results for input(s): CHOL in the last 8760 hours.  Scheduled Meds: . amLODipine  5 mg Oral Daily  . enoxaparin (LOVENOX) injection  40 mg Subcutaneous Q24H  . furosemide  20 mg Oral Daily  . insulin glargine  5 Units Subcutaneous Daily  . metoprolol tartrate  25 mg Oral BID  . sodium chloride flush  3 mL Intravenous Q12H   Continuous Infusions: . 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 F/U in office in 2 weeks. Will try colchicine 0.6 mg. daily.   LOS: 2 days   Time spent including chart review, lab review, examination, discussion with patient/Physician : 25 min   Dixie Dials  MD  01/27/2020,  6:18 PM

## 2020-01-27 NOTE — Progress Notes (Signed)
Pt oxygen sats resting on room air is 90-92%. Pt oxygen sats with exertion on room air is 89%.  Pt oxygen sats resting on 1 L O2 is 95%.   Pt oxygen sats with exertion on 1 L O2 is 92%

## 2020-01-28 ENCOUNTER — Ambulatory Visit (HOSPITAL_COMMUNITY): Admission: RE | Admit: 2020-01-28 | Payer: Self-pay | Source: Ambulatory Visit

## 2020-01-28 DIAGNOSIS — I160 Hypertensive urgency: Secondary | ICD-10-CM

## 2020-01-28 LAB — BASIC METABOLIC PANEL
Anion gap: 12 (ref 5–15)
BUN: 14 mg/dL (ref 8–23)
CO2: 23 mmol/L (ref 22–32)
Calcium: 8.8 mg/dL — ABNORMAL LOW (ref 8.9–10.3)
Chloride: 103 mmol/L (ref 98–111)
Creatinine, Ser: 0.64 mg/dL (ref 0.44–1.00)
GFR, Estimated: >60 mL/min (ref >60)
Glucose, Bld: 113 mg/dL — ABNORMAL HIGH (ref 70–99)
Potassium: 3.1 mmol/L — ABNORMAL LOW (ref 3.5–5.1)
Sodium: 138 mmol/L (ref 135–145)

## 2020-01-28 LAB — CBC
HCT: 40 % (ref 36.0–46.0)
Hemoglobin: 12.6 g/dL (ref 12.0–15.0)
MCH: 28.3 pg (ref 26.0–34.0)
MCHC: 31.5 g/dL (ref 30.0–36.0)
MCV: 89.7 fL (ref 80.0–100.0)
Platelets: 271 10*3/uL (ref 150–400)
RBC: 4.46 MIL/uL (ref 3.87–5.11)
RDW: 13.3 % (ref 11.5–15.5)
WBC: 6.1 10*3/uL (ref 4.0–10.5)
nRBC: 0 % (ref 0.0–0.2)

## 2020-01-28 LAB — MAGNESIUM: Magnesium: 1.9 mg/dL (ref 1.7–2.4)

## 2020-01-28 MED ORDER — METOPROLOL TARTRATE 25 MG PO TABS: 25.0000 mg | ORAL_TABLET | Freq: Two times a day (BID) | ORAL | 1 refills | Status: DC

## 2020-01-28 MED ORDER — FUROSEMIDE 20 MG PO TABS: 20.0000 mg | ORAL_TABLET | Freq: Every day | ORAL | 0 refills | Status: DC

## 2020-01-28 MED ORDER — COLCHICINE 0.6 MG PO TABS: 0.6000 mg | ORAL_TABLET | Freq: Two times a day (BID) | ORAL | 1 refills | Status: AC

## 2020-01-28 MED ORDER — POTASSIUM CHLORIDE 20 MEQ PO PACK
40.0000 meq | PACK | Freq: Once | ORAL | Status: AC
  Administered 2020-01-28: 10:00:00 40 meq via ORAL
  Filled 2020-01-28: qty 2

## 2020-01-28 MED ORDER — AMLODIPINE BESYLATE 5 MG PO TABS: 5.0000 mg | ORAL_TABLET | Freq: Every day | ORAL | 1 refills | Status: DC

## 2020-01-28 NOTE — Consult Note (Signed)
Ref: Sara Pike, MD   Subjective:  VS stable. Leg edema persist. Sinus rhythm with borderline tachycardia. Mild respiratory distress persist  Objective:  Vital Signs in the last 24 hours: Temp:  [98.5 F (36.9 C)-99 F (37.2 C)] 98.5 F (36.9 C) (12/11 0529) Pulse Rate:  [95-103] 95 (12/11 0529) Cardiac Rhythm: Sinus tachycardia (12/11 0704) Resp:  [18-20] 18 (12/11 0529) BP: (159-189)/(65-91) 159/72 (12/11 0529) SpO2:  [91 %-93 %] 91 % (12/11 0529)  Physical Exam: BP Readings from Last 1 Encounters:  01/28/20 (!) 159/72     Wt Readings from Last 1 Encounters:  01/27/20 72.8 kg    Weight change:  Body mass index is 27.55 kg/m. HEENT: [place]/AT, Eyes-Brown, Conjunctiva-Pink, Sclera-Non-icteric Neck: No JVD, No bruit, Trachea midline. Lungs:  Clear, Bilateral. Cardiac:  Regular rhythm, normal S1 and S2, no S3. II/VI systolic murmur. Abdomen:  Soft, non-tender. BS present. Extremities:  2 + edema present. No cyanosis. No clubbing. CNS: AxOx3, Cranial nerves grossly intact, moves all 4 extremities.  Skin: Warm and dry.   Intake/Output from previous day: 12/10 0701 - 12/11 0700 In: 240 [P.O.:240] Out: 350 [Urine:350]    Lab Results: BMET    Component Value Date/Time   NA 138 01/28/2020 0644   NA 138 01/27/2020 0634   NA 135 01/26/2020 0455   K 3.1 (L) 01/28/2020 0644   K 3.5 01/27/2020 0634   K 3.4 (L) 01/26/2020 0455   CL 103 01/28/2020 0644   CL 101 01/27/2020 0634   CL 100 01/26/2020 0455   CO2 23 01/28/2020 0644   CO2 27 01/27/2020 0634   CO2 23 01/26/2020 0455   GLUCOSE 113 (H) 01/28/2020 0644   GLUCOSE 191 (H) 01/27/2020 0634   GLUCOSE 211 (H) 01/26/2020 0455   BUN 14 01/28/2020 0644   BUN 17 01/27/2020 0634   BUN 12 01/26/2020 0455   CREATININE 0.64 01/28/2020 0644   CREATININE 0.75 01/27/2020 0634   CREATININE 0.62 01/26/2020 0455   CALCIUM 8.8 (L) 01/28/2020 0644   CALCIUM 9.1 01/27/2020 0634   CALCIUM 9.1 01/26/2020 0455   GFRNONAA >60  01/28/2020 0644   GFRNONAA >60 01/27/2020 0634   GFRNONAA >60 01/26/2020 0455   CBC    Component Value Date/Time   WBC 6.1 01/28/2020 0644   RBC 4.46 01/28/2020 0644   HGB 12.6 01/28/2020 0644   HCT 40.0 01/28/2020 0644   PLT 271 01/28/2020 0644   MCV 89.7 01/28/2020 0644   MCH 28.3 01/28/2020 0644   MCHC 31.5 01/28/2020 0644   RDW 13.3 01/28/2020 0644   LYMPHSABS 0.9 01/25/2020 1158   MONOABS 0.4 01/25/2020 1158   EOSABS 0.0 01/25/2020 1158   BASOSABS 0.0 01/25/2020 1158   HEPATIC Function Panel Recent Labs    12/30/19 1646 12/31/19 0656 01/25/20 1158  PROT 8.1 7.4 8.7*   HEMOGLOBIN A1C No components found for: HGA1C,  MPG CARDIAC ENZYMES No results found for: CKTOTAL, CKMB, CKMBINDEX, TROPONINI BNP No results for input(s): PROBNP in the last 8760 hours. TSH No results for input(s): TSH in the last 8760 hours. CHOLESTEROL No results for input(s): CHOL in the last 8760 hours.  Scheduled Meds: . amLODipine  5 mg Oral Daily  . colchicine  0.6 mg Oral BID  . enoxaparin (LOVENOX) injection  40 mg Subcutaneous Q24H  . furosemide  20 mg Oral Daily  . insulin glargine  5 Units Subcutaneous Daily  . metoprolol tartrate  25 mg Oral BID  . sodium chloride flush  3  mL Intravenous Q12H   Continuous Infusions: . 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 F/U in 2 weeks   LOS: 3 days   Time spent including chart review, lab review, examination, discussion with patient/family and nurse : 30  min   Dixie Dials  MD  01/28/2020, 1:42 PM

## 2020-01-28 NOTE — Discharge Instructions (Signed)
If you have worsening problems with your breathing call your doctor right away If you notice difficulty even completing sentences because of your breathing call your doctor right away If you have new chest pain call your doctor right away  You will continue taking your blood pressure medications ( amlodipine, metoprolol) and your fluid pill(lasix). Those refills were sent to your pharmacy  You were prescribed colchicine to take every day to help with the fluid around your heart  Your cardiologist will call for an appointment outside of the hospital in a few weeks

## 2020-01-30 ENCOUNTER — Encounter: Payer: Self-pay | Admitting: *Deleted

## 2020-01-30 ENCOUNTER — Other Ambulatory Visit (HOSPITAL_COMMUNITY): Payer: Self-pay | Admitting: Hematology and Oncology

## 2020-01-30 ENCOUNTER — Telehealth: Payer: Self-pay | Admitting: Pharmacist

## 2020-01-30 ENCOUNTER — Inpatient Hospital Stay (HOSPITAL_COMMUNITY): Admit: 2020-01-30 | Payer: Medicare Other

## 2020-01-30 ENCOUNTER — Encounter (HOSPITAL_COMMUNITY): Payer: Self-pay

## 2020-01-30 ENCOUNTER — Telehealth: Payer: Self-pay | Admitting: *Deleted

## 2020-01-30 ENCOUNTER — Ambulatory Visit (HOSPITAL_COMMUNITY)
Admission: RE | Admit: 2020-01-30 | Discharge: 2020-01-30 | Disposition: A | Discharge status: Home / Self Care | Payer: Self-pay | Source: Ambulatory Visit | Attending: Hematology and Oncology | Admitting: Hematology and Oncology

## 2020-01-30 DIAGNOSIS — C3490 Malignant neoplasm of unspecified part of unspecified bronchus or lung: Secondary | ICD-10-CM

## 2020-01-30 DIAGNOSIS — C3492 Malignant neoplasm of unspecified part of left bronchus or lung: Secondary | ICD-10-CM

## 2020-01-30 MED ORDER — GADOBUTROL 1 MMOL/ML IV SOLN
7.0000 mL | Freq: Once | INTRAVENOUS | Status: AC | PRN
  Administered 2020-01-30: 20:00:00 7 mL via INTRAVENOUS

## 2020-01-30 MED ORDER — OSIMERTINIB MESYLATE 80 MG PO TABS: 80.0000 mg | ORAL_TABLET | Freq: Every day | ORAL | 11 refills | Status: DC

## 2020-01-30 MED ORDER — OSIMERTINIB MESYLATE 80 MG PO TABS
80.0000 mg | ORAL_TABLET | Freq: Every day | ORAL | 11 refills | Status: DC
Start: 2020-01-30 — End: 2020-08-17

## 2020-01-30 NOTE — Progress Notes (Signed)
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 or Exon 21 L858R Substitution) Therapeutic Status: Stage IV Metastatic Histology: Nonsquamous Cell ROS1 Rearrangement Status: Did Not Order Test Other Mutations/Biomarkers: No Other Actionable Mutations Chemotherapy/Immunotherapy LOT: Not Appropriate Molecular Targeted Therapy: Initial Molecular Targeted Therapy KRAS G12C Mutation Status: Did Not Order Test MET Exon 14 Mutation Status: Did Not Order Test RET Gene Fusion Status: Did Not Order Test EGFR Mutation Status: Positive - Common (Exon 19 Deletion or Exon 21 L858R Substitution) NTRK Gene Fusion Status: Did Not Order Test PD-L1 Expression Status: PD-L1 Positive 1-49% (TPS) ALK Rearrangement Status: Did Not Order Test BRAF [id number] Mutation Status: Did Not Order Test Intent of Therapy: Non-Curative / Palliative Intent, Discussed with Patient

## 2020-01-30 NOTE — Progress Notes (Signed)
Printed [facility] one report and gave to Dr. Rob Hickman nurse.

## 2020-01-30 NOTE — Progress Notes (Signed)
I received a message from Dr. [name] stating patient will be starting on Tagrisso oral biologic.  I followed up with pharmacy to give them an update, they were thankful for th information.  I also received a message from Dr. Rob Hickman nurse stating patient's daughter has been hard to get in touch with to update on appts. I will call and follow up with the daughter.

## 2020-01-30 NOTE — Telephone Encounter (Signed)
I called and spoke with the patient's niece today.  She was frustrated at the beginning of the phone call but at the end, she states she felt better about plan.  I helped to explain the plan of care and side effects.  I asked her to call me if she needed more information or help with transportation or other issues she notes.  She said she would and was thankful for the call.

## 2020-01-30 NOTE — Progress Notes (Signed)
Pt was supposed to get MRI brain on 11 th scheduled outpatient. She ended up being admitted to the hospital, initial plan was to discharge her before 1 th but let her keep the appointment for MR brain. Then she had to stay another day given concern about fluid overload. I was told that MRI inpatient on weekend can only be done if emergent. Hence we had to cancel, re order STAT to be done asap  Sara Ferguson

## 2020-01-30 NOTE — Progress Notes (Signed)
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 Discussed the findings with niece, put a plan in for tagrisso first line. We will still need the MR brain for staging. Then she cant take colchicine, has interaction with tagrisso, niece is aware, will discuss with cardiologist for alternate choices.  Sara Ferguson

## 2020-01-31 ENCOUNTER — Ambulatory Visit (HOSPITAL_COMMUNITY): Payer: Medicare Other

## 2020-01-31 NOTE — Telephone Encounter (Signed)
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  Prescription dose and frequency assessed for appropriateness.  CBC, BMP and Mg from 01/28/20 assessed, noted K 3.1 mmol/L - recommend consideration of replacement since Tagrisso may also cause hypokalemia. All other labs stable.  Current medication list in Epic reviewed, DDIs with Tagrisso identified:  Category D DDI between [place] and Colchicine - Tagrisso may increase serum concentrations and medication distribution of colchicine through PgP/ABCB1 inhibition. Noted pt most recently started colchicine for pericardial effusion while inpatient. Recommend discussion with cardiology regarding possible alternatives to colchicine to avoid drug toxicity.    Evaluated chart and no patient barriers to medication adherence noted.   Unable to find prescription insurance for patient - will proceed with applying for manufacturer assistance through [employer]. Will obtain patient's signature at office visit on 02/01/20.  [facility] will continue to follow for insurance authorization, copayment issues, initial counseling and start date.  Leron Croak, PharmD, BCPS Hematology/Oncology Clinical Pharmacist [facility] [phone] 01/31/2020 10:13 AM

## 2020-02-01 ENCOUNTER — Telehealth: Payer: Self-pay

## 2020-02-01 ENCOUNTER — Inpatient Hospital Stay: Payer: Self-pay | Attending: Physician Assistant | Admitting: Hematology and Oncology

## 2020-02-01 ENCOUNTER — Other Ambulatory Visit: Payer: Self-pay

## 2020-02-01 ENCOUNTER — Encounter: Payer: Self-pay | Admitting: Hematology and Oncology

## 2020-02-01 VITALS — BP 181/97 | HR 90 | Temp 99.1°F | Resp 20 | Ht 64.0 in | Wt 166.7 lb

## 2020-02-01 DIAGNOSIS — Z803 Family history of malignant neoplasm of breast: Secondary | ICD-10-CM | POA: Insufficient documentation

## 2020-02-01 DIAGNOSIS — C3490 Malignant neoplasm of unspecified part of unspecified bronchus or lung: Secondary | ICD-10-CM | POA: Insufficient documentation

## 2020-02-01 DIAGNOSIS — I313 Pericardial effusion (noninflammatory): Secondary | ICD-10-CM | POA: Insufficient documentation

## 2020-02-01 DIAGNOSIS — Z808 Family history of malignant neoplasm of other organs or systems: Secondary | ICD-10-CM | POA: Insufficient documentation

## 2020-02-01 DIAGNOSIS — I10 Essential (primary) hypertension: Secondary | ICD-10-CM | POA: Insufficient documentation

## 2020-02-01 DIAGNOSIS — E119 Type 2 diabetes mellitus without complications: Secondary | ICD-10-CM | POA: Insufficient documentation

## 2020-02-01 DIAGNOSIS — J90 Pleural effusion, not elsewhere classified: Secondary | ICD-10-CM | POA: Insufficient documentation

## 2020-02-01 DIAGNOSIS — Z79899 Other long term (current) drug therapy: Secondary | ICD-10-CM | POA: Insufficient documentation

## 2020-02-01 DIAGNOSIS — C3492 Malignant neoplasm of unspecified part of left bronchus or lung: Secondary | ICD-10-CM

## 2020-02-01 NOTE — Progress Notes (Signed)
[place] CONSULT NOTE  Patient Care Team: [name], MD as PCP - General (Hematology and Oncology) Dishmon, [name], RN as Oncology Nurse Navigator (Oncology)  CHIEF COMPLAINTS/PURPOSE OF CONSULTATION:   Adenocarcinoma of the lung  ASSESSMENT & PLAN:  No problem-specific Assessment & Plan notes found for this encounter.  This is a very pleasant 74 year old female patient with past medical history significant for hypertension, diabetes mellitus type 2 referred to hematology and medical oncology for new diagnosis of adenocarcinoma of the lung. Sara Ferguson arrived to the appointment today with her niece [name].  According to patient, she has been having symptoms for the past 4 to 5 months predominantly shortness of breath, cough without any hemoptysis and some weight loss which she attributed to the dietary changes.  She was admitted at [facility] with the above-mentioned complaints and had imaging suggestive of pleural effusion and left lung, mild to moderate pericardial effusion and changes reflecting primary bronchogenic carcinoma grossly stable left axillary confluent mediastinal and hilar adenopathy.  CT abdomen with left adrenal nodule suggestive of possible metastatic disease.  She had cytology which confirmed the diagnosis of adenocarcinoma.  Molecular spending. 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 MR brain negative for malignancy. She was found to have  EGFR exon 19 deletion which is targetable, will start first line tagrisso. I have clearly explained that this is a stage IV lung cancer and the intent of treatment is palliative. We have discussed about side effects of tagrisso including fatigue, skin rash, diarrhea, EKG changes, interstitial lung disease etc. They are agreeable to the above-mentioned plan.  Return to clinic in 3 weeks with me. Periodic thoracentesis has been scheduled.  From exam today, she may not need thoracentesis this week. Engaged NN team to assist with management.   2. HTN, increase norvasc to 10 mg daily. Uncontrolled at this time. Also on metoprolol 25 mg PO BID  Thank you for consulting [place] in the care of this patient.  Please do not hesitate to contact us with any additional questions or concerns.  No orders of the defined types were placed in this encounter.   HISTORY OF PRESENTING ILLNESS:   Sara Ferguson 74 y.o. female is here because of new diagnosis of adenocarcinoma lung.  Chronology  74 year old female with history of hypertension, diabetes presented to the ED with hypoxia shortness of breath found to have large left-sided pleural effusion as well as pericardial effusion. Patient tells me that she had SOB for about 5 months prior to presentation and she initially thought it was bronchitis. She has also noted some weight loss of about 15 lbs and attributed this to dietary changes. Cough noted, lot of phlegm , no hemoptysis. A day before presentation to the hospital, she noticed that she could not walk to the mailbox and hence went to the hospital. 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 Repeat chest x-ray 11/15 showed persistent consolidation/effusion of the left lower lobe, and repeat thoracentesis performed 11/17 with 850 mL fluid  removal. Patient underwent CT chest showed  improved aeration of the left lung with residual left infrahilar masslike density possibly reflecting the primary bronchogenic carcinoma, grossly stable left axillary confluent mediastinal and hilar adenopathy consistent with metastatic disease, left adrenal nodule suspicious for metastasis, new small dependent right pleural effusion stable pericardial effusion. CT abdomen pelvis showed no evidence of primary intra abdominal or intrapelvic malignancy. Partial visualization of heterogeneously enhancing left lower lung opacities as well as Left pleural effusion. 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 There is a 2.4 cm LEFT adrenal nodule. Findings are concerning for metastatic disease.  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 MR brain negative for malignancy.  Foundation one EGFR exon 19 deletion. She is here for FU after hospital discharge, admitted for SOB, needed thoracentesis and some diuresis.   She denies any worsening SOB although niece thinks she is still quite SOB. No other complaints for me today She is able to shower, dress herself, spends most of the time sitting. She says she is taking all her medications except colchicine given interaction with tagrisso.  She never smoked or chewed any tobacco products No occupational exposure. Worked in [employer] (medical) [employer], mom died of liver cancer in 80's, sister died of breast cancer in 79's She hasnt had  mammogram in some time, hasnt taken COVID vaccine. Rest of the 10 point ROS reviewed and negative.  MEDICAL HISTORY:  Past Medical History:  Diagnosis Date  . Arthritis   . Diabetes mellitus without complication ([place])   . Hypertension   . Non-small cell lung cancer ([place])    Dx 12/30/19 by thoracentesis     SURGICAL HISTORY: Past Surgical History:  Procedure Laterality Date  . ABDOMINAL HYSTERECTOMY    . ECTOPIC PREGNANCY SURGERY      SOCIAL HISTORY: Social History   Socioeconomic History  . Marital status: Widowed    Spouse name: Not on file  . Number of children: Not on file  . Years of education: Not on file  . Highest education level: Not on file  Occupational History  . Not on file  Tobacco Use  . Smoking status: Never Smoker  . Smokeless tobacco: Never Used  Substance and Sexual Activity  . Alcohol use: Not Currently  . Drug use: Never  . Sexual activity: Not on file  Other Topics Concern  . Not on file  Social History Narrative  . Not on file   Social Determinants of Health   Financial Resource Strain: Low Risk   . Difficulty of Paying Living Expenses: Not hard at all  Food Insecurity: No Food Insecurity  . Worried About Charity fundraiser in the Last Year: Never true  . Ran Out of Food in the Last Year: Never true  Transportation Needs: No Transportation Needs  . Lack of Transportation (Medical): No  . Lack of Transportation (Non-Medical): No  Physical Activity: Inactive  . Days of Exercise per Week: 0 days  . Minutes of Exercise per Session: 0 min  Stress: No Stress Concern Present  . Feeling of Stress : Not at all  Social Connections: Socially Isolated  . Frequency of Communication with Friends and Family: Once a week  . Frequency of Social Gatherings with Friends and Family: Once a week  . Attends Religious Services: Never  . Active Member of Clubs or Organizations: No  . Attends Archivist Meetings: 1 to 4 times per year  . Marital  Status: Widowed  Intimate Partner Violence: Not At Risk  . Fear of Current or Ex-Partner: No  . Emotionally Abused: No  . Physically Abused: No  . Sexually Abused: No    FAMILY HISTORY: No family history on file.  ALLERGIES:  has No Known Allergies.  MEDICATIONS:  Current Outpatient Medications  Medication Sig Dispense Refill  . albuterol (VENTOLIN HFA) 108 (90 Base) MCG/ACT inhaler Inhale 2 puffs into the lungs every 6 (six) hours as needed for wheezing or shortness of breath. 1 each 1  . amLODipine (NORVASC) 5 MG tablet Take 1 tablet (5 mg total) by mouth daily. 30 tablet 1  . colchicine 0.6 MG tablet Take 1 tablet (0.6 mg total) by mouth 2 (two) times daily. (Patient not taking: Reported on 02/01/2020) 90 tablet 1  . furosemide (LASIX) 20 MG tablet Take 1 tablet (20 mg total) by mouth daily for 14 days. 14 tablet 0  . guaifenesin (ROBITUSSIN) 100 MG/5ML syrup Take 200 mg by mouth 3 (three) times daily as needed for cough.    . hydrocortisone cream 1 % Apply 1 application topically daily as needed for itching (rash).    . metoprolol tartrate (LOPRESSOR) 25 MG tablet Take 1 tablet (25 mg total) by mouth 2 (two) times daily. 60 tablet 1  . osimertinib mesylate (TAGRISSO) 80 MG tablet Take 1 tablet (80 mg total) by mouth daily. Days 1-28. 30 tablet 11  . tetrahydrozoline 0.05 % ophthalmic solution Place 1 drop into both eyes daily as needed (dry, itchy eyes).     No current facility-administered medications for this visit.     PHYSICAL EXAMINATION: ECOG PERFORMANCE STATUS: 2 - Symptomatic, <50% confined to bed  Vitals:   02/01/20 1541  BP: (!) 181/97  Pulse: 90  Resp: 20  Temp: 99.1 F (37.3 C)  SpO2: 94%   Filed Weights   02/01/20 1541  Weight: 166 lb 11.2 oz (75.6 kg)    GENERAL:alert, no distress and comfortable SKIN: skin color, texture, turgor are normal, no rashes or significant lesions EYES: normal, conjunctiva are pink and non-injected, sclera  clear OROPHARYNX:no exudate, no erythema and lips, buccal mucosa, and tongue normal  NECK: supple, thyroid normal size, non-tender, without nodularity LYMPH: palpable supraclavicular LN on the left, multiple and small. No other lymphadenopathy LUNGS: left pleural effusion, moderate, decent air entry in the left lung. Some wheezing, left upper lobe HEART: regular rate & rhythm and no murmurs and no lower extremity edema. On exam heart sounds are loud and clear, no large pericardial effusion. ABDOMEN:abdomen soft, non-tender and normal bowel sounds Musculoskeletal:no cyanosis of digits and no clubbing  PSYCH: alert & oriented x 3 with fluent speech NEURO: no focal motor/sensory deficits  LABORATORY DATA:  I have reviewed the data as listed Lab Results  Component Value Date   WBC 6.1 01/28/2020   HGB 12.6 01/28/2020   HCT 40.0 01/28/2020   MCV 89.7 01/28/2020   PLT 271 01/28/2020     Chemistry      Component Value Date/Time   NA 138 01/28/2020 0644   K 3.1 (L) 01/28/2020 0644   CL 103 01/28/2020 1655  CO2 23 01/28/2020 0644   BUN 14 01/28/2020 0644   CREATININE 0.64 01/28/2020 0644      Component Value Date/Time   CALCIUM 8.8 (L) 01/28/2020 0644   ALKPHOS 199 (H) 01/25/2020 1158   AST 36 01/25/2020 1158   ALT 54 (H) 01/25/2020 1158   BILITOT 1.3 (H) 01/25/2020 1158       RADIOGRAPHIC STUDIES: I have personally reviewed the radiological images as listed and agreed with the findings in the report. DG Chest 1 View  Result Date: 01/04/2020 CLINICAL DATA:  Left thoracentesis, pleural effusions EXAM: CHEST  1 VIEW COMPARISON:  01/02/2020 FINDINGS: Notable reduction in size of the left pleural effusion. No significant pneumothorax. Stable enlargement of the cardiopericardial silhouette. Atherosclerotic calcification of the aortic arch. Bony demineralization. IMPRESSION: 1. Notable reduction in size of the left pleural effusion, status post thoracentesis. No significant  pneumothorax. 2. Stable enlargement of the cardiopericardial silhouette. Electronically Signed   By: Van Clines M.D.   On: 01/04/2020 09:08   DG Chest 2 View  Result Date: 01/25/2020 CLINICAL DATA:  Shortness of breath EXAM: CHEST - 2 VIEW COMPARISON:  01/04/2020 FINDINGS: Cardiomegaly. Near complete opacification of the left hemithorax, likely a combination of effusion and airspace disease. Only and small amount of aerated left upper lobe noted which appears to contain airspace disease. Right lung clear. No acute bony abnormality. IMPRESSION: Near complete opacification of the left hemithorax, likely a combination of pleural effusion and airspace disease. Cardiomegaly Electronically Signed   By: Rolm Baptise M.D.   On: 01/25/2020 11:52   CT CHEST W CONTRAST  Result Date: 01/04/2020 CLINICAL DATA:  Non-small cell lung cancer. Patient underwent thoracentesis today. EXAM: CT CHEST WITH CONTRAST TECHNIQUE: Multidetector CT imaging of the chest was performed during intravenous contrast administration. CONTRAST:  77m OMNIPAQUE IOHEXOL 300 MG/ML  SOLN COMPARISON:  Abdominopelvic CT 12/31/2019.  Chest CT 12/30/2019. FINDINGS: Cardiovascular: Moderate atherosclerosis of the aorta, great vessels and coronary arteries. No acute vascular findings are seen. There is suboptimal opacification of the pulmonary arteries without evidence of acute pulmonary embolism. Mild cardiomegaly and small to moderate pericardial effusion are again noted. Mediastinum/Nodes: Again demonstrated is extensive confluent mediastinal and hilar adenopathy, similar to previous CT. Right paratracheal node measures approximately 3.3 x 2.9 cm on image 37/2. AP window node measures 3.0 x 3.6 cm on image 37/2. There is a subcarinal node measuring 2.4 cm short axis on image 52/2. Mildly enlarged left axillary lymph nodes are stable, measuring up to 1.6 cm on image 32/2. The thyroid gland, trachea and esophagus demonstrate no significant  findings. Lungs/Pleura: The left pleural effusion has decreased in volume and appears partially loculated. There is a new small dependent right pleural effusion. No pneumothorax. The overall aeration of the left lung has improved with a residual left infrahilar mass-like density measuring up to 5.8 x 5.1 cm on image 60/6. There is diffuse central airway thickening in both lungs with narrowing of the left lower lobe central bronchi. Upper abdomen: Again demonstrated is a left adrenal nodule measuring 2.9 x 2.1 cm, suspicious for a metastasis. There is no right adrenal nodule. A nodular density projecting medial to the lower pole of the right kidney on image 28/3 likely represents bowel based on recent prior abdominal CT. No liver lesions are seen. Musculoskeletal/Chest wall: There is no chest wall mass or suspicious osseous finding. Mild degenerative changes throughout the spine. IMPRESSION: 1. Interval decreased volume of left pleural effusion following thoracentesis. No pneumothorax. 2. Improved  aeration of the left lung with residual left infrahilar mass-like density, possibly reflecting the primary bronchogenic carcinoma. 3. Grossly stable left axillary, confluent mediastinal and hilar adenopathy consistent with metastatic disease. 4. Stable left adrenal nodule, suspicious for metastasis. 5. New small dependent right pleural effusion. Stable pericardial effusion. 6. Aortic Atherosclerosis (ICD10-I70.0). Electronically Signed   By: Richardean Sale M.D.   On: 01/04/2020 20:20   MR Brain W Wo Contrast  Result Date: 01/30/2020 CLINICAL DATA:  Non-small cell lung carcinoma staging EXAM: MRI HEAD WITHOUT AND WITH CONTRAST TECHNIQUE: Multiplanar, multiecho pulse sequences of the brain and surrounding structures were obtained without and with intravenous contrast. CONTRAST:  30m GADAVIST GADOBUTROL 1 MMOL/ML IV SOLN COMPARISON:  None. FINDINGS: Brain: No acute infarct, mass effect or extra-axial collection. No acute  or chronic hemorrhage. Hyperintense T2-weighted signal is moderately widespread throughout the white matter. Generalized volume loss without a clear lobar predilection. The midline structures are normal. There is no abnormal contrast enhancement. Vascular: Major flow voids are preserved. Skull and upper cervical spine: Normal calvarium and skull base. Visualized upper cervical spine and soft tissues are normal. Sinuses/Orbits:No paranasal sinus fluid levels or advanced mucosal thickening. No mastoid or middle ear effusion. Normal orbits. IMPRESSION: 1. No intracranial metastatic disease. 2. Moderate chronic microvascular disease and generalized volume loss. Electronically Signed   By: KUlyses JarredM.D.   On: 01/30/2020 20:35   NM PET Image Initial (PI) Skull Base To Thigh  Result Date: 01/09/2020 CLINICAL DATA:  Initial treatment strategy for non-small cell lung carcinoma. EXAM: NUCLEAR MEDICINE PET SKULL BASE TO THIGH TECHNIQUE: 7.6 mCi F-18 FDG was injected intravenously. Full-ring PET imaging was performed from the skull base to thigh after the radiotracer. CT data was obtained and used for attenuation correction and anatomic localization. Fasting blood glucose: 229 mg/dl COMPARISON:  Chest CT on 03/06/2019 FINDINGS: Mediastinal blood-pool activity (background): SUV max = 2.7 Liver activity (reference): SUV max = N/A NECK: Hypermetabolic lymphadenopathy seen in the lower jugular chains bilaterally, and bilateral supraclavicular regions. Incidental CT findings:  None. CHEST: A rounded masslike opacity is seen in the central left lower lobe which measures 5.2 x 4.7 cm on image 66/4, and is hypermetabolic with SUV max of 4.9. This masslike opacity appears to obstruct the posterior left lower lobe bronchus, resulting in left lower lobe postobstructive atelectasis and pneumonitis. Another focal area of hypermetabolic activity is seen in the posteroinferior left lower lobe which measures approximately 4 cm and  has an SUV max of 6.6. This may represent another left lower lobe pulmonary mass. A moderate multiloculated left pleural effusion is seen without hypermetabolic activity. Diffuse left lung interstitial thickening is seen, suspicious for lymphangitic spread of carcinoma. Mild lymphadenopathy is seen throughout the mediastinum, bilateral hilar regions, left internal mammary chain, and left subpectoral and axillary regions, which is hypermetabolic and consistent with metastatic disease. Index lymph node in the subcarinal region measures 2.0 cm on image 61/4, with SUV max of 5.9. Incidental CT findings: Small to moderate pericardial effusion, without associated hypermetabolic activity. ABDOMEN/PELVIS: No abnormal hypermetabolic activity within the liver, pancreas, or spleen. A 2.4 cm left adrenal mass is seen which shows mild FDG uptake with SUV max of 2.8. This is suspicious for adrenal metastasis. A 1.0 cm left retrocrural lymph node is seen on image 82/4 which is hypermetabolic, with SUV max of 4.2. No other hypermetabolic lymph nodes in the abdomen or pelvis. Incidental CT findings:  None. SKELETON: No focal hypermetabolic bone lesions to suggest  skeletal metastasis. Incidental CT findings:  None. IMPRESSION: Hypermetabolic masslike opacities in the central left lower lobe and possibly the posterior-inferior left lower lobe, consistent with primary bronchogenic carcinoma. Moderate multiloculated left pleural effusion, and findings consistent with diffuse left lung lymphangitic spread of carcinoma. Hypermetabolic lymphadenopathy throughout the chest, lower neck, and left retrocrural region, consistent with metastatic disease. 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 Small to moderate pericardial effusion, without associated FDG activity. Electronically Signed   By: Marlaine Hind M.D.   On: 01/09/2020 16:04   DG CHEST PORT 1 VIEW  Result Date: 01/26/2020 CLINICAL DATA:  Post thoracentesis EXAM: PORTABLE CHEST 1 VIEW  COMPARISON:  01/25/2020 FINDINGS: Decreasing left effusion, now moderate. Improved aeration in the left lung with continued left lower lobe atelectasis or infiltrate. No pneumothorax. Right lung clear. Cardiomegaly, mild vascular congestion. No acute bony abnormality. IMPRESSION: Moderate left effusion, decreasing since prior study. No pneumothorax. Continued left lower lobe atelectasis or infiltrate. Cardiomegaly, vascular congestion. Electronically Signed   By: Rolm Baptise M.D.   On: 01/26/2020 13:16   VAS [place] LOWER EXTREMITY VENOUS (DVT)  Result Date: 01/26/2020  Lower Venous DVT Study Indications: Pain, and Swelling.  Risk Factors: None identified. Limitations: Body habitus and poor ultrasound/tissue interface. Comparison Study: No prior studies. Performing Technologist: Oliver Hum RVT  Examination Guidelines: A complete evaluation includes B-mode imaging, spectral Doppler, color Doppler, and power Doppler as needed of all accessible portions of each vessel. Bilateral testing is considered an integral part of a complete examination. Limited examinations for reoccurring indications may be performed as noted. The reflux portion of the exam is performed with the patient in reverse Trendelenburg.  +---------+---------------+---------+-----------+----------+--------------+ RIGHT    CompressibilityPhasicitySpontaneityPropertiesThrombus Aging +---------+---------------+---------+-----------+----------+--------------+ CFV      Full           Yes      Yes                                 +---------+---------------+---------+-----------+----------+--------------+ SFJ      Full                                                        +---------+---------------+---------+-----------+----------+--------------+ FV Prox  Full                                                        +---------+---------------+---------+-----------+----------+--------------+ FV Mid   Full                                                         +---------+---------------+---------+-----------+----------+--------------+ FV DistalFull                                                        +---------+---------------+---------+-----------+----------+--------------+ PFV      Full                                                        +---------+---------------+---------+-----------+----------+--------------+  POP      Full           Yes      Yes                                 +---------+---------------+---------+-----------+----------+--------------+ PTV      Full                                                        +---------+---------------+---------+-----------+----------+--------------+ PERO     Full                                                        +---------+---------------+---------+-----------+----------+--------------+   +---------+---------------+---------+-----------+----------+--------------+ LEFT     CompressibilityPhasicitySpontaneityPropertiesThrombus Aging +---------+---------------+---------+-----------+----------+--------------+ CFV      Full           Yes      Yes                                 +---------+---------------+---------+-----------+----------+--------------+ SFJ      Full                                                        +---------+---------------+---------+-----------+----------+--------------+ FV Prox  Full                                                        +---------+---------------+---------+-----------+----------+--------------+ FV Mid   Full                                                        +---------+---------------+---------+-----------+----------+--------------+ FV DistalFull                                                        +---------+---------------+---------+-----------+----------+--------------+ PFV      Full                                                         +---------+---------------+---------+-----------+----------+--------------+ POP      Full           Yes      Yes                                 +---------+---------------+---------+-----------+----------+--------------+  PTV      Full                                                        +---------+---------------+---------+-----------+----------+--------------+ PERO     Full                                                        +---------+---------------+---------+-----------+----------+--------------+     Summary: RIGHT: - There is no evidence of deep vein thrombosis in the lower extremity.  - No cystic structure found in the popliteal fossa.  LEFT: - There is no evidence of deep vein thrombosis in the lower extremity.  - No cystic structure found in the popliteal fossa.  *See table(s) above for measurements and observations. Electronically signed by Monica Martinez MD on 01/26/2020 at 4:56:52 PM.    Final    ECHOCARDIOGRAM LIMITED  Result Date: 01/26/2020    ECHOCARDIOGRAM LIMITED REPORT   Patient Name:   Sara Ferguson Date of Exam: 01/26/2020 Medical Rec #:  [id number]      Height:       64.0 in Accession #:    [id number]     Weight:       168.0 lb Date of Birth:  1945/12/31      BSA:          1.817 m Patient Age:    62 years       BP:           175/125 mmHg Patient Gender: F              HR:           111 bpm. Exam Location:  Inpatient Procedure: Cardiac Doppler, Color Doppler and Limited Echo REPORT CONTAINS CRITICAL RESULT Indications:    I31.3 Pericardial effusion (noninflammatory) Respiratory                 failure.  History:        Patient has prior history of Echocardiogram examinations, most                 recent 01/01/2020. Abnormal ECG, Signs/Symptoms:Shortness of                 Breath and Dyspnea; Risk Factors:Hypertension and Diabetes.                 Cancer. Pericardial effusion. Pleural effusion.  Sonographer:    Roseanna Rainbow RDCS Referring Phys: [id number] Donita Brooks   Sonographer Comments: Technically difficult study due to poor echo windows. IMPRESSIONS  1. Left ventricular ejection fraction, by estimation, is 55 to 60%. The left ventricle has normal function. The left ventricle has no regional wall motion abnormalities.  2. Right ventricular systolic function is normal. The right ventricular size is normal.  3. Moderate pericardial effusion surrounds heart Measurese about 14 mm in maximal dimension. Ther is mild RV/RA indentation Mitral inflow is without respiratory variation. IVC is dilated and does not change significantly with inspiration. Overall does not meet full echo criteria for tamponade. Clinical correlation indicated. Compared to previous echo, effusion is circumferential Page sent to Dr Lonny Prude. Large pleural effusion.  4. The mitral valve is abnormal. Trivial mitral valve regurgitation.  5. The aortic valve is tricuspid. Mild aortic valve sclerosis is present, with no evidence of aortic valve stenosis.  6. The inferior vena cava is dilated in size with <50% respiratory variability, suggesting right atrial pressure of 15 mmHg. FINDINGS  Left Ventricle: Left ventricular ejection fraction, by estimation, is 55 to 60%. The left ventricle has normal function. The left ventricle has no regional wall motion abnormalities. Right Ventricle: The right ventricular size is normal. Right vetricular wall thickness was not assessed. Right ventricular systolic function is normal. Right Atrium: Right atrial size was normal in size. Pericardium: Moderate pericardial effusion surrounds heart Measurese about 14 mm in maximal dimension. Ther is mild RV/RA indentation Mitral inflow is without respiratory variation. IVC is dilated and does not change significantly with inspiration. Overall does not meet full echo criteria for tamponade. Clinical correlation indicated. Compared to previous echo, effusion is circumferential Page sent to Dr Lonny Prude. The pericardial effusion is  circumferential. Mitral Valve: The mitral valve is abnormal. There is mild thickening of the mitral valve leaflet(s). Mild mitral annular calcification. Trivial mitral valve regurgitation. Tricuspid Valve: The tricuspid valve is grossly normal. Tricuspid valve regurgitation is trivial. Aortic Valve: The aortic valve is tricuspid. Mild aortic valve sclerosis is present, with no evidence of aortic valve stenosis. Aorta: The aortic root is normal in size and structure. Venous: The inferior vena cava is dilated in size with less than 50% respiratory variability, suggesting right atrial pressure of 15 mmHg. Additional Comments: There is a large pleural effusion. LEFT VENTRICLE PLAX 2D LVIDd:         2.95 cm LVIDs:         2.10 cm LV PW:         1.85 cm LV IVS:        1.35 cm LVOT diam:     1.60 cm LVOT Area:     2.01 cm  LEFT ATRIUM         Index LA diam:    2.10 cm 1.16 cm/m   AORTA Ao Root diam: 2.65 cm  SHUNTS Systemic Diam: 1.60 cm Dorris Carnes MD Electronically signed by Dorris Carnes MD Signature Date/Time: 01/26/2020/12:03:22 PM    Final    US THORACENTESIS ASP PLEURAL SPACE W/IMG GUIDE  Result Date: 01/04/2020 INDICATION: Left pleural effusion Known Non small cell lung cancer EXAM: ULTRASOUND GUIDED LEFT THORACENTESIS MEDICATIONS: 10 cc 1% lidocaine. COMPLICATIONS: None immediate. PROCEDURE: An ultrasound guided thoracentesis was thoroughly discussed with the patient and questions answered. The benefits, risks, alternatives and complications were also discussed. The patient understands and wishes to proceed with the procedure. Written consent was obtained. Ultrasound was performed to localize and mark an adequate pocket of fluid in the left chest. The area was then prepped and draped in the normal sterile fashion. 1% Lidocaine was used for local anesthesia. Under ultrasound guidance a 19 G Yueh catheter was introduced. Thoracentesis was performed. The catheter was removed and a dressing applied. FINDINGS: A  total of approximately 850 cc of serosanguinous fluid was removed. Samples were sent to the laboratory as requested by the clinical team. IMPRESSION: Successful ultrasound guided left thoracentesis yielding 850 cc of pleural fluid. Postprocedural chest radiograph showed no pneumothorax. Read by Lavonia Drafts [facility] Electronically Signed   By: Van Clines M.D.   On: 01/04/2020 09:03    Pathology  12/30/2019  Component 2 wk ago  CYTOLOGY - NON GYN CYTOLOGY - NON  PAP   THIS IS AN ADDENDUM REPORT  CASE: [id number]  PATIENT: Sara Ferguson  Non-Gynecological Cytology Report     Clinical History: None provided  Specimen Submitted: A. PLEURAL FLUID, LEFT, THORACENTESIS:    FINAL MICROSCOPIC DIAGNOSIS:  - Malignant cells consistent with non-small cell carcinoma  - See comment.   SPECIMEN ADEQUACY:  Satisfactory for evaluation   DIAGNOSTIC COMMENTS:  Immunohistochemistry will be performed and reported as an addendum.     Component 9 d ago  CYTOLOGY - NON GYN CYTOLOGY - NON PAP  CASE: [id number]  PATIENT: Sara Ferguson  Non-Gynecological Cytology Report      Clinical History: None provided  Specimen Submitted: A. PLEURAL FLUID, LEFT, THORACENTESIS:    FINAL MICROSCOPIC DIAGNOSIS:  - Malignant cells consistent with metastatic adenocarcinoma   SPECIMEN ADEQUACY:  Satisfactory for evaluation         All questions were answered. The patient knows to call the clinic with any problems, questions or concerns. I spent a total of 30 minutes in the care of this patient, including H and P< review of outside records and imaging, counseling and coordination of care.     [name], MD 02/01/2020 4:46 PM

## 2020-02-01 NOTE — Telephone Encounter (Signed)
Oral Oncology Patient Advocate Encounter  Met patient in lobby to complete application for [employer] in an effort to reduce patient's out of pocket expense for Tagrisso to $0.    Application completed and faxed to [phone].   [employer] patient assistance phone number for follow up is [phone].   This encounter will be updated until final determination.   [facility] Patient [place] Phone [phone] Fax [phone] 02/01/2020 4:22 PM

## 2020-02-01 NOTE — Telephone Encounter (Signed)
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   Counseled patient on administration, dosing, side effects, monitoring, drug-food interactions, safe handling, storage, and disposal.  Patient will take Tagrisso 80 tablets, 1 tablet by mouth once daily, without regard to food.  Tagrisso start date: tentatively 02/03/20  Adverse effects include but are not limited to: diarrhea, mouth sores, decreased appetitie, fatigue, dry skin, rash, nail changes, altered cardiac conduction, and decreased blood counts or electrolytes.   Patient will obtain anti diarrheal and alert the office of 4 or more loose stools above baseline.   Reviewed with patient importance of keeping a medication schedule and plan for any missed doses. No barriers to medication adherence identified.  Medication reconciliation performed and medication/allergy list updated. Confirmed with Dr. [name] as well as patient and niece that she will hold colchicine once initiating therapy with Tagrisso.  Proceeding with applying for manufacturer assistance through [employer] for [place]. Application faxed on 16/60/60.   All questions answered.  Sara Ferguson voiced understanding and appreciation.   Medication education handout given to patient in clinic. Patient knows to call the office with questions or concerns. Oral Chemotherapy Clinic phone number provided to patient.   Leron Croak, PharmD, BCPS Hematology/Oncology Clinical Pharmacist [facility] [phone] 02/01/2020 4:20 PM

## 2020-02-02 ENCOUNTER — Telehealth: Payer: Self-pay | Admitting: Hematology and Oncology

## 2020-02-02 ENCOUNTER — Ambulatory Visit (HOSPITAL_COMMUNITY): Payer: Self-pay

## 2020-02-02 NOTE — Telephone Encounter (Signed)
Scheduled appt per 12/15 sch msg - pt niece is aware of appt date and time

## 2020-02-07 ENCOUNTER — Other Ambulatory Visit (HOSPITAL_COMMUNITY): Payer: Self-pay

## 2020-02-09 ENCOUNTER — Ambulatory Visit (HOSPITAL_COMMUNITY): Admission: RE | Admit: 2020-02-09 | Payer: Self-pay | Source: Ambulatory Visit

## 2020-02-14 ENCOUNTER — Ambulatory Visit (HOSPITAL_COMMUNITY): Payer: Self-pay | Attending: Hematology and Oncology

## 2020-02-15 ENCOUNTER — Emergency Department (HOSPITAL_COMMUNITY)
Admission: EM | Admit: 2020-02-15 | Discharge: 2020-02-16 | Disposition: A | Discharge status: Home / Self Care | Payer: Self-pay | Attending: Emergency Medicine | Admitting: Emergency Medicine

## 2020-02-15 ENCOUNTER — Encounter (HOSPITAL_COMMUNITY): Payer: Self-pay

## 2020-02-15 ENCOUNTER — Other Ambulatory Visit: Payer: Self-pay

## 2020-02-15 DIAGNOSIS — I10 Essential (primary) hypertension: Secondary | ICD-10-CM | POA: Insufficient documentation

## 2020-02-15 DIAGNOSIS — Z85118 Personal history of other malignant neoplasm of bronchus and lung: Secondary | ICD-10-CM | POA: Insufficient documentation

## 2020-02-15 DIAGNOSIS — Z79899 Other long term (current) drug therapy: Secondary | ICD-10-CM | POA: Insufficient documentation

## 2020-02-15 DIAGNOSIS — E876 Hypokalemia: Secondary | ICD-10-CM | POA: Insufficient documentation

## 2020-02-15 DIAGNOSIS — R600 Localized edema: Secondary | ICD-10-CM | POA: Insufficient documentation

## 2020-02-15 DIAGNOSIS — E119 Type 2 diabetes mellitus without complications: Secondary | ICD-10-CM | POA: Insufficient documentation

## 2020-02-15 DIAGNOSIS — M7989 Other specified soft tissue disorders: Secondary | ICD-10-CM | POA: Insufficient documentation

## 2020-02-15 LAB — COMPREHENSIVE METABOLIC PANEL
ALT: 14 U/L (ref 0–44)
AST: 20 U/L (ref 15–41)
Albumin: 3.2 g/dL — ABNORMAL LOW (ref 3.5–5.0)
Alkaline Phosphatase: 105 U/L (ref 38–126)
Anion gap: 8 (ref 5–15)
BUN: 13 mg/dL (ref 8–23)
CO2: 24 mmol/L (ref 22–32)
Calcium: 8.8 mg/dL — ABNORMAL LOW (ref 8.9–10.3)
Chloride: 102 mmol/L (ref 98–111)
Creatinine, Ser: 0.66 mg/dL (ref 0.44–1.00)
GFR, Estimated: >60 mL/min (ref >60)
Glucose, Bld: 135 mg/dL — ABNORMAL HIGH (ref 70–99)
Potassium: 3.3 mmol/L — ABNORMAL LOW (ref 3.5–5.1)
Sodium: 134 mmol/L — ABNORMAL LOW (ref 135–145)
Total Bilirubin: 0.8 mg/dL (ref 0.3–1.2)
Total Protein: 8.4 g/dL — ABNORMAL HIGH (ref 6.5–8.1)

## 2020-02-15 LAB — CBC WITH DIFFERENTIAL/PLATELET
Abs Immature Granulocytes: 0.03 10*3/uL (ref 0.00–0.07)
Basophils Absolute: 0 10*3/uL (ref 0.0–0.1)
Basophils Relative: 1 %
Eosinophils Absolute: 0.1 10*3/uL (ref 0.0–0.5)
Eosinophils Relative: 2 %
HCT: 37.3 % (ref 36.0–46.0)
Hemoglobin: 11.9 g/dL — ABNORMAL LOW (ref 12.0–15.0)
Immature Granulocytes: 1 %
Lymphocytes Relative: 23 %
Lymphs Abs: 1.2 10*3/uL (ref 0.7–4.0)
MCH: 28.2 pg (ref 26.0–34.0)
MCHC: 31.9 g/dL (ref 30.0–36.0)
MCV: 88.4 fL (ref 80.0–100.0)
Monocytes Absolute: 0.4 10*3/uL (ref 0.1–1.0)
Monocytes Relative: 8 %
Neutro Abs: 3.5 10*3/uL (ref 1.7–7.7)
Neutrophils Relative %: 65 %
Platelets: 213 10*3/uL (ref 150–400)
RBC: 4.22 MIL/uL (ref 3.87–5.11)
RDW: 13.5 % (ref 11.5–15.5)
WBC: 5.3 10*3/uL (ref 4.0–10.5)
nRBC: 0 % (ref 0.0–0.2)

## 2020-02-15 MED ORDER — FUROSEMIDE 20 MG PO TABS: 20.0000 mg | ORAL_TABLET | Freq: Every day | ORAL | 0 refills | Status: DC

## 2020-02-15 MED ORDER — POTASSIUM CHLORIDE ER 10 MEQ PO TBCR: 10.0000 meq | EXTENDED_RELEASE_TABLET | Freq: Every day | ORAL | 0 refills | Status: DC

## 2020-02-15 MED ORDER — POTASSIUM CHLORIDE CRYS ER 20 MEQ PO TBCR
40.0000 meq | EXTENDED_RELEASE_TABLET | Freq: Once | ORAL | Status: AC
  Administered 2020-02-15: 23:00:00 40 meq via ORAL
  Filled 2020-02-15: qty 2

## 2020-02-15 MED ORDER — FUROSEMIDE 40 MG PO TABS
40.0000 mg | ORAL_TABLET | Freq: Once | ORAL | Status: AC
Start: 2020-02-15 — End: 2020-02-15
  Administered 2020-02-15: 23:00:00 40 mg via ORAL
  Filled 2020-02-15: qty 1

## 2020-02-15 NOTE — ED Triage Notes (Signed)
Pt reports bilateral leg swelling x 2 weeks. Sts she is a lung cancer pt currently receiving treatment.

## 2020-02-15 NOTE — ED Provider Notes (Signed)
[facility] DEPT Provider Note   CSN: [id number] Arrival date & time: 02/15/20  2025     History Chief Complaint  Patient presents with   Leg Swelling    Sara Ferguson is a 74 y.o. female.  Patient presents ER chief complaint of bilateral leg swelling.  She has noticed this for about 2 or 3 weeks.  She states that she tried using compression stockings but they felt too tight.  Denies chest pain or shortness of breath.  No fever no vomiting no cough no diarrhea.        Past Medical History:  Diagnosis Date   Arthritis    Diabetes mellitus without complication ([place])    Hypertension    Non-small cell lung cancer ([place])    Dx 12/30/19 by thoracentesis     Patient Active Problem List   Diagnosis Date Noted   Hypokalemia 01/25/2020   Volume overload 01/25/2020   Adenocarcinoma of left lung, stage 4 ([place]) 01/13/2020   Goals of care, counseling/discussion 01/13/2020   Pleural effusion, left 12/31/2019   Type 2 diabetes mellitus without complication ([place]) 78/46/9629   Acute respiratory failure with hypoxia ([place]) 12/31/2019   Pericardial effusion 12/31/2019   Essential hypertension 12/30/2019   Pleural effusion, malignant 12/30/2019   Pleural effusion 12/30/2019    Past Surgical History:  Procedure Laterality Date   ABDOMINAL HYSTERECTOMY     ECTOPIC PREGNANCY SURGERY       OB History   No obstetric history on file.     No family history on file.  Social History   Tobacco Use   Smoking status: Never Smoker   Smokeless tobacco: Never Used  Substance Use Topics   Alcohol use: Not Currently   Drug use: Never    Home Medications Prior to Admission medications   Medication Sig Start Date End Date Taking? Authorizing Provider  potassium chloride (KLOR-CON) 10 MEQ tablet Take 1 tablet (10 mEq total) by mouth daily for 7 days. 02/15/20 02/22/20 Yes Luna Fuse, MD  albuterol (VENTOLIN HFA) 108 (90 Base)  MCG/ACT inhaler Inhale 2 puffs into the lungs every 6 (six) hours as needed for wheezing or shortness of breath. 01/05/20 02/04/20  Antonieta Pert, MD  amLODipine (NORVASC) 5 MG tablet Take 1 tablet (5 mg total) by mouth daily. 01/28/20 03/28/20  Oretha Milch D, MD  colchicine 0.6 MG tablet Take 1 tablet (0.6 mg total) by mouth 2 (two) times daily. Patient not taking: Reported on 02/01/2020 01/28/20   Oretha Milch D, MD  furosemide (LASIX) 20 MG tablet Take 1 tablet (20 mg total) by mouth daily for 7 days. 02/15/20 02/22/20  Luna Fuse, MD  guaifenesin (ROBITUSSIN) 100 MG/5ML syrup Take 200 mg by mouth 3 (three) times daily as needed for cough.    [provider]  hydrocortisone cream 1 % Apply 1 application topically daily as needed for itching (rash).    [provider]  metoprolol tartrate (LOPRESSOR) 25 MG tablet Take 1 tablet (25 mg total) by mouth 2 (two) times daily. 01/28/20 03/28/20  Desiree Hane, MD  osimertinib mesylate (TAGRISSO) 80 MG tablet Take 1 tablet (80 mg total) by mouth daily. Days 1-28. 01/30/20   [name], MD  tetrahydrozoline 0.05 % ophthalmic solution Place 1 drop into both eyes daily as needed (dry, itchy eyes).    [provider]    Allergies    Patient has no known allergies.  Review of Systems   Review of Systems  Constitutional: Negative for fever.  HENT: Negative for ear pain.   Eyes: Negative for pain.  Respiratory: Negative for cough.   Cardiovascular: Negative for chest pain.  Gastrointestinal: Negative for abdominal pain.  Genitourinary: Negative for flank pain.  Musculoskeletal: Negative for back pain.  Skin: Negative for rash.  Neurological: Negative for headaches.    Physical Exam Updated Vital Signs BP (!) 192/75 (BP Location: Right Arm)    Pulse 96    Temp 99.1 F (37.3 C) (Oral)    Resp 18    Ht 5\' 4"  (1.626 m)    Wt 75.3 kg    SpO2 98%    BMI 28.49 kg/m   Physical Exam Constitutional:      General: She  is not in acute distress.    Appearance: Normal appearance.  HENT:     Head: Normocephalic.     Nose: Nose normal.  Eyes:     Extraocular Movements: Extraocular movements intact.  Cardiovascular:     Rate and Rhythm: Normal rate.  Pulmonary:     Effort: Pulmonary effort is normal.  Abdominal:     Palpations: Abdomen is soft.     Tenderness: There is no abdominal tenderness.  Musculoskeletal:        General: Normal range of motion.     Cervical back: Normal range of motion.     Right lower leg: Edema present.     Left lower leg: Edema present.  Skin:    General: Skin is warm.  Neurological:     General: No focal deficit present.     Mental Status: She is alert.     ED Results / Procedures / Treatments   Labs (all labs ordered are listed, but only abnormal results are displayed) Labs Reviewed  CBC WITH DIFFERENTIAL/PLATELET - Abnormal; Notable for the following components:      Result Value   Hemoglobin 11.9 (*)    All other components within normal limits  COMPREHENSIVE METABOLIC PANEL - Abnormal; Notable for the following components:   Sodium 134 (*)    Potassium 3.3 (*)    Glucose, Bld 135 (*)    Calcium 8.8 (*)    Total Protein 8.4 (*)    Albumin 3.2 (*)    All other components within normal limits    EKG None  Radiology No results found.  Procedures Procedures (including critical care time)  Medications Ordered in ED Medications  furosemide (LASIX) tablet 40 mg (40 mg Oral Given 02/15/20 2242)  potassium chloride SA (KLOR-CON) CR tablet 40 mEq (40 mEq Oral Given 02/15/20 2242)    ED Course  I have reviewed the triage vital signs and the nursing notes.  Pertinent labs & imaging results that were available during my care of the patient were reviewed by me and considered in my medical decision making (see chart for details).    MDM Rules/Calculators/A&P                          She has history of cancer presents with bilateral leg swelling and  pitting edema 2+.  No open skin lesion or abnormal warmth or cellulitis noted.  Labs otherwise unremarkable mild hypokalemia noted.  Given oral potassium replacement.  Advised return tomorrow at 11:00 for outpatient ultrasound of bilateral lower extremities.   Final Clinical Impression(s) / ED Diagnoses Final diagnoses:  Leg swelling    Rx / DC Orders ED Discharge Orders  Ordered    furosemide (LASIX) 20 MG tablet  Daily        02/15/20 2324    potassium chloride (KLOR-CON) 10 MEQ tablet  Daily        02/15/20 2324           Luna Fuse, MD 02/15/20 2324

## 2020-02-15 NOTE — Discharge Instructions (Addendum)
IMPORTANT PATIENT INSTRUCTIONS:  You have been scheduled for an Outpatient Vascular Study at [facility].    (Monday-Friday), please go to [facility] Registration at 11 am and tell them you are there for a vascular study.  Call your primary care doctor or specialist as discussed in the next 2-3 days.   Return immediately back to the ER if:  Your symptoms worsen within the next 12-24 hours. You develop new symptoms such as new fevers, persistent vomiting, new pain, shortness of breath, or new weakness or numbness, or if you have any other concerns.

## 2020-02-16 ENCOUNTER — Emergency Department (HOSPITAL_COMMUNITY): Admission: RE | Admit: 2020-02-16 | Payer: Self-pay | Source: Ambulatory Visit

## 2020-02-16 ENCOUNTER — Ambulatory Visit (HOSPITAL_COMMUNITY): Admit: 2020-02-16 | Payer: Self-pay

## 2020-02-21 ENCOUNTER — Other Ambulatory Visit (HOSPITAL_COMMUNITY)
Admission: RE | Admit: 2020-02-21 | Discharge: 2020-02-21 | Disposition: A | Discharge status: Home / Self Care | Payer: Self-pay | Source: Ambulatory Visit | Attending: Hematology and Oncology | Admitting: Hematology and Oncology

## 2020-02-21 ENCOUNTER — Ambulatory Visit (HOSPITAL_COMMUNITY): Payer: Self-pay

## 2020-02-21 DIAGNOSIS — Z01818 Encounter for other preprocedural examination: Secondary | ICD-10-CM | POA: Insufficient documentation

## 2020-02-21 DIAGNOSIS — Z20822 Contact with and (suspected) exposure to covid-19: Secondary | ICD-10-CM | POA: Insufficient documentation

## 2020-02-22 ENCOUNTER — Other Ambulatory Visit: Payer: Self-pay | Admitting: Oncology

## 2020-02-22 LAB — SARS CORONAVIRUS 2 (TAT 6-24 HRS): SARS Coronavirus 2: NEGATIVE

## 2020-02-23 ENCOUNTER — Telehealth: Payer: Self-pay | Admitting: Hematology and Oncology

## 2020-02-23 ENCOUNTER — Emergency Department (HOSPITAL_COMMUNITY): Admission: EM | Admit: 2020-02-23 | Discharge: 2020-02-23 | Discharge status: Absent without leave | Payer: Self-pay

## 2020-02-23 ENCOUNTER — Other Ambulatory Visit: Payer: Self-pay

## 2020-02-23 ENCOUNTER — Ambulatory Visit (HOSPITAL_COMMUNITY)
Admit: 2020-02-23 | Discharge: 2020-02-23 | Disposition: A | Discharge status: Home / Self Care | Payer: Self-pay | Source: Ambulatory Visit | Attending: Hematology and Oncology | Admitting: Hematology and Oncology

## 2020-02-23 ENCOUNTER — Ambulatory Visit (HOSPITAL_COMMUNITY)
Admission: RE | Admit: 2020-02-23 | Discharge: 2020-02-23 | Disposition: A | Discharge status: Home / Self Care | Payer: Self-pay | Source: Ambulatory Visit | Attending: Radiology | Admitting: Radiology

## 2020-02-23 DIAGNOSIS — J910 Malignant pleural effusion: Secondary | ICD-10-CM | POA: Insufficient documentation

## 2020-02-23 DIAGNOSIS — Z9889 Other specified postprocedural states: Secondary | ICD-10-CM

## 2020-02-23 MED ORDER — LIDOCAINE HCL 1 % IJ SOLN
INTRAMUSCULAR | Status: AC
  Filled 2020-02-23: qty 20

## 2020-02-23 NOTE — Procedures (Addendum)
Ultrasound-guided  therapeutic left thoracentesis performed yielding 300 cc of yellow fluid. No immediate complications. Follow-up chest x-ray pending. EBL< 1 cc. Left pleural effusion was small by [place] today.

## 2020-02-23 NOTE — Telephone Encounter (Signed)
Scheduled appointment per inbasket msg from [employer]. Spoke to patient's niece who is aware of appointment date and time and that appt. Will be a phone visit.

## 2020-02-24 ENCOUNTER — Encounter: Payer: Self-pay | Admitting: Hematology and Oncology

## 2020-02-24 ENCOUNTER — Inpatient Hospital Stay: Payer: Self-pay | Attending: Physician Assistant | Admitting: Hematology and Oncology

## 2020-02-24 DIAGNOSIS — R531 Weakness: Secondary | ICD-10-CM | POA: Insufficient documentation

## 2020-02-24 DIAGNOSIS — I10 Essential (primary) hypertension: Secondary | ICD-10-CM | POA: Insufficient documentation

## 2020-02-24 DIAGNOSIS — I313 Pericardial effusion (noninflammatory): Secondary | ICD-10-CM | POA: Insufficient documentation

## 2020-02-24 DIAGNOSIS — E279 Disorder of adrenal gland, unspecified: Secondary | ICD-10-CM | POA: Insufficient documentation

## 2020-02-24 DIAGNOSIS — Z90710 Acquired absence of both cervix and uterus: Secondary | ICD-10-CM | POA: Insufficient documentation

## 2020-02-24 DIAGNOSIS — J910 Malignant pleural effusion: Secondary | ICD-10-CM

## 2020-02-24 DIAGNOSIS — Z803 Family history of malignant neoplasm of breast: Secondary | ICD-10-CM | POA: Insufficient documentation

## 2020-02-24 DIAGNOSIS — R5383 Other fatigue: Secondary | ICD-10-CM | POA: Insufficient documentation

## 2020-02-24 DIAGNOSIS — Z800 Family history of malignant neoplasm of digestive organs: Secondary | ICD-10-CM | POA: Insufficient documentation

## 2020-02-24 DIAGNOSIS — E119 Type 2 diabetes mellitus without complications: Secondary | ICD-10-CM | POA: Insufficient documentation

## 2020-02-24 DIAGNOSIS — C3492 Malignant neoplasm of unspecified part of left bronchus or lung: Secondary | ICD-10-CM

## 2020-02-24 DIAGNOSIS — C3491 Malignant neoplasm of unspecified part of right bronchus or lung: Secondary | ICD-10-CM | POA: Insufficient documentation

## 2020-02-24 DIAGNOSIS — Z808 Family history of malignant neoplasm of other organs or systems: Secondary | ICD-10-CM

## 2020-02-24 DIAGNOSIS — J90 Pleural effusion, not elsewhere classified: Secondary | ICD-10-CM | POA: Insufficient documentation

## 2020-02-24 MED ORDER — FUROSEMIDE 20 MG PO TABS: 20.0000 mg | ORAL_TABLET | Freq: Every day | ORAL | 0 refills | Status: DC

## 2020-02-24 NOTE — Progress Notes (Signed)
[name]  Patient Care Team: [name], MD as PCP - General (Hematology and Oncology) Dishmon, [name], RN as Oncology Nurse Navigator (Oncology)  CHIEF COMPLAINTS/PURPOSE OF CONSULTATION:   Adenocarcinoma of the lung  ASSESSMENT & PLAN:  No problem-specific Assessment & Plan notes found for this encounter.  This is a very pleasant 75 year old female patient with past medical history significant for hypertension, diabetes mellitus type 2 referred to hematology and medical oncology for new diagnosis of adenocarcinoma of the lung. [name] arrived to the appointment today with her niece Sara Ferguson.  According to patient, she has been having symptoms for the past 4 to 5 months predominantly shortness of breath, cough without any hemoptysis and some weight loss which she attributed to the dietary changes.  She was admitted at [facility] with the above-mentioned complaints and had imaging suggestive of pleural effusion and left lung, mild to moderate pericardial effusion and changes reflecting primary bronchogenic carcinoma grossly stable left axillary confluent mediastinal and hilar adenopathy.  CT abdomen with left adrenal nodule suggestive of possible metastatic disease.  She had cytology which confirmed the diagnosis of adenocarcinoma.  Molecular spending. 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 MR brain negative for malignancy. She was found to have  EGFR exon 19 deletion which is targetable, will start first line tagrisso. I have clearly explained that this is a stage IV lung cancer and the intent of treatment is palliative. We have discussed about side effects of tagrisso including fatigue, skin rash, diarrhea, EKG changes, interstitial lung disease etc. She started tagrisso 3 weeks ago. She has been feeling more tired and has less appetite since she started tagrisso, lost about 2 lbs. She recently went to the ED with BLE swelling, was prescribed lasix. Lasix has been helping her tremendously. No worsening BLE swelling or SOB. Niece provided most of the history, patient close by. At this time, I have recommended we continue lasix daily, continue tagrisso daily, return to in person appointment next week and FU if she needs lower dose of tagrisso. Niece is ok with this plan. Clinically she is improving, hasnt needed a thoracentesis in almost a month.  2. HTN,recommended to get a BP machine and log BP daily She is on norvasc and metoprolol at this time.  Thank you for consulting [place] in the care of this patient.  Please do not hesitate to contact us with any additional questions or concerns.  No orders of the defined types were placed in this encounter.   HISTORY OF PRESENTING ILLNESS:   Sara Ferguson 75 y.o. female is here because of new diagnosis of adenocarcinoma lung.  Chronology  75 year old female with history of hypertension, diabetes presented to the ED with hypoxia shortness of breath found to have large left-sided pleural effusion as well as pericardial effusion. Patient tells me that she had SOB for about 5 months prior to presentation and she initially thought it was bronchitis. She has also noted some weight loss of about 15 lbs and attributed this to dietary changes. Cough noted, lot of phlegm , no hemoptysis. A day  before presentation to the hospital, she noticed that she could not walk to the mailbox and hence went to the  hospital. 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 Repeat chest x-ray 11/15 showed persistent consolidation/effusion of the left lower lobe, and repeat thoracentesis performed 11/17 with 850 mL fluid removal. Patient underwent CT chest showed improved aeration of the left lung with residual left infrahilar masslike density possibly reflecting the primary bronchogenic carcinoma, grossly stable left axillary confluent mediastinal and hilar adenopathy consistent with metastatic disease, left adrenal nodule suspicious for metastasis, new small dependent right pleural effusion stable pericardial effusion. CT abdomen pelvis showed no evidence of primary intra abdominal or intrapelvic malignancy. Partial visualization of heterogeneously enhancing left lower lung opacities as well as Left pleural effusion. 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 There is a 2.4 cm LEFT adrenal nodule. Findings are concerning for metastatic disease.  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 MR brain negative for malignancy.  Foundation one EGFR exon 19 deletion. Started tagrisso Dec 2nd week 2021.  Niece provided most of the history today. Niece says her aunt is feeling more tired and weak since the past 2 weeks, eating less, may have lost a couple  lbs. She didn't however feel a need for thoracentesis until yesterday, so went about 4 weeks without needing one. No chest pain or worsening SOB, niece couldn't take her to the [place]. She is now vaccinated for COVID 19.  She never smoked or chewed any tobacco products No occupational exposure. Worked in [employer] (medical) [employer], mom died of liver cancer in 63's, sister died of breast cancer in 59's She hasnt had mammogram in some time, hasnt taken COVID vaccine. Rest of the 10 point ROS reviewed and negative.  MEDICAL HISTORY:  Past Medical History:  Diagnosis Date  . Arthritis   . Diabetes mellitus without complication ([place])   . Hypertension   . Non-small cell lung cancer ([place])    Dx 12/30/19 by thoracentesis     SURGICAL HISTORY: Past Surgical History:  Procedure Laterality Date  . ABDOMINAL HYSTERECTOMY    . ECTOPIC PREGNANCY SURGERY      SOCIAL HISTORY: Social History   Socioeconomic History  . Marital status: Widowed    Spouse name: Not on file  . Number of children: Not on file  . Years of education: Not on file  . Highest education level: Not on file  Occupational History  . Not on file  Tobacco Use  . Smoking status: Never Smoker  . Smokeless tobacco: Never Used  Substance and Sexual Activity  . Alcohol use: Not Currently  . Drug use: Never  . Sexual activity: Not on file  Other Topics Concern  . Not on file  Social History Narrative  . Not on file   Social Determinants of Health   Financial Resource Strain: Low Risk   . Difficulty of Paying Living Expenses: Not hard at all  Food Insecurity: No Food Insecurity  . Worried About [employer] of  Food in the Last Year: Never true  . Ran Out of Food in the Last Year: Never true  Transportation Needs: No Transportation Needs  . Lack of Transportation (Medical): No  . Lack of Transportation (Non-Medical): No  Physical Activity: Inactive  . Days of Exercise per Week: 0 days  . Minutes of Exercise per Session: 0 min   Stress: No Stress Concern Present  . Feeling of Stress : Not at all  Social Connections: Socially Isolated  . Frequency of Communication with Friends and Family: Once a week  . Frequency of Social Gatherings with Friends and Family: Once a week  . Attends Religious Services: Never  . Active Member of Clubs or Organizations: No  . Attends Archivist Meetings: 1 to 4 times per year  . Marital Status: Widowed  Intimate Partner Violence: Not At Risk  . Fear of Current or Ex-Partner: No  . Emotionally Abused: No  . Physically Abused: No  . Sexually Abused: No    FAMILY HISTORY: No family history on file.  ALLERGIES:  has No Known Allergies.  MEDICATIONS:  Current Outpatient Medications  Medication Sig Dispense Refill  . albuterol (VENTOLIN HFA) 108 (90 Base) MCG/ACT inhaler Inhale 2 puffs into the lungs every 6 (six) hours as needed for wheezing or shortness of breath. 1 each 1  . amLODipine (NORVASC) 5 MG tablet Take 1 tablet (5 mg total) by mouth daily. 30 tablet 1  . colchicine 0.6 MG tablet Take 1 tablet (0.6 mg total) by mouth 2 (two) times daily. (Patient not taking: Reported on 02/01/2020) 90 tablet 1  . furosemide (LASIX) 20 MG tablet Take 1 tablet (20 mg total) by mouth daily for 7 days. 7 tablet 0  . guaifenesin (ROBITUSSIN) 100 MG/5ML syrup Take 200 mg by mouth 3 (three) times daily as needed for cough.    . hydrocortisone cream 1 % Apply 1 application topically daily as needed for itching (rash).    . metoprolol tartrate (LOPRESSOR) 25 MG tablet Take 1 tablet (25 mg total) by mouth 2 (two) times daily. 60 tablet 1  . osimertinib mesylate (TAGRISSO) 80 MG tablet Take 1 tablet (80 mg total) by mouth daily. Days 1-28. 30 tablet 11  . potassium chloride (KLOR-CON) 10 MEQ tablet Take 1 tablet (10 mEq total) by mouth daily for 7 days. 7 tablet 0  . tetrahydrozoline 0.05 % ophthalmic solution Place 1 drop into both eyes daily as needed (dry, itchy eyes).     No current  facility-administered medications for this visit.     PHYSICAL EXAMINATION: ECOG PERFORMANCE STATUS: 2 - Symptomatic, <50% confined to bed  There were no vitals filed for this visit. There were no vitals filed for this visit.  PE not done, phone visit  LABORATORY DATA:  I have reviewed the data as listed Lab Results  Component Value Date   WBC 5.3 02/15/2020   HGB 11.9 (L) 02/15/2020   HCT 37.3 02/15/2020   MCV 88.4 02/15/2020   PLT 213 02/15/2020     Chemistry      Component Value Date/Time   NA 134 (L) 02/15/2020 2042   K 3.3 (L) 02/15/2020 2042   CL 102 02/15/2020 2042   CO2 24 02/15/2020 2042   BUN 13 02/15/2020 2042   CREATININE 0.66 02/15/2020 2042      Component Value Date/Time   CALCIUM 8.8 (L) 02/15/2020 2042   ALKPHOS 105 02/15/2020 2042   AST 20 02/15/2020 2042   ALT 14  02/15/2020 2042   BILITOT 0.8 02/15/2020 2042       RADIOGRAPHIC STUDIES: I have personally reviewed the radiological images as listed and agreed with the findings in the report. DG Chest 1 View  Result Date: 02/23/2020 CLINICAL DATA:  Status post left thoracentesis. EXAM: CHEST  1 VIEW COMPARISON:  01/26/2020 FINDINGS: Unchanged cardiomegaly. No pneumothorax status post left thoracentesis. Interval decrease of left pleural effusion. Left basilar opacity persists. IMPRESSION: No pneumothorax status post left thoracentesis. Electronically Signed   By: Miachel Roux M.D.   On: 02/23/2020 16:09   DG Chest 2 View  Result Date: 01/25/2020 CLINICAL DATA:  Shortness of breath EXAM: CHEST - 2 VIEW COMPARISON:  01/04/2020 FINDINGS: Cardiomegaly. Near complete opacification of the left hemithorax, likely a combination of effusion and airspace disease. Only and small amount of aerated left upper lobe noted which appears to contain airspace disease. Right lung clear. No acute bony abnormality. IMPRESSION: Near complete opacification of the left hemithorax, likely a combination of pleural effusion and  airspace disease. Cardiomegaly Electronically Signed   By: Rolm Baptise M.D.   On: 01/25/2020 11:52   MR Brain W Wo Contrast  Result Date: 01/30/2020 CLINICAL DATA:  Non-small cell lung carcinoma staging EXAM: MRI HEAD WITHOUT AND WITH CONTRAST TECHNIQUE: Multiplanar, multiecho pulse sequences of the brain and surrounding structures were obtained without and with intravenous contrast. CONTRAST:  79m GADAVIST GADOBUTROL 1 MMOL/ML IV SOLN COMPARISON:  None. FINDINGS: Brain: No acute infarct, mass effect or extra-axial collection. No acute or chronic hemorrhage. Hyperintense T2-weighted signal is moderately widespread throughout the white matter. Generalized volume loss without a clear lobar predilection. The midline structures are normal. There is no abnormal contrast enhancement. Vascular: Major flow voids are preserved. Skull and upper cervical spine: Normal calvarium and skull base. Visualized upper cervical spine and soft tissues are normal. Sinuses/Orbits:No paranasal sinus fluid levels or advanced mucosal thickening. No mastoid or middle ear effusion. Normal orbits. IMPRESSION: 1. No intracranial metastatic disease. 2. Moderate chronic microvascular disease and generalized volume loss. Electronically Signed   By: KUlyses JarredM.D.   On: 01/30/2020 20:35   DG CHEST PORT 1 VIEW  Result Date: 01/26/2020 CLINICAL DATA:  Post thoracentesis EXAM: PORTABLE CHEST 1 VIEW COMPARISON:  01/25/2020 FINDINGS: Decreasing left effusion, now moderate. Improved aeration in the left lung with continued left lower lobe atelectasis or infiltrate. No pneumothorax. Right lung clear. Cardiomegaly, mild vascular congestion. No acute bony abnormality. IMPRESSION: Moderate left effusion, decreasing since prior study. No pneumothorax. Continued left lower lobe atelectasis or infiltrate. Cardiomegaly, vascular congestion. Electronically Signed   By: KRolm BaptiseM.D.   On: 01/26/2020 13:16   VAS [place]OWER EXTREMITY VENOUS  (DVT)  Result Date: 01/26/2020  Lower Venous DVT Study Indications: Pain, and Swelling.  Risk Factors: None identified. Limitations: Body habitus and poor ultrasound/tissue interface. Comparison Study: No prior studies. Performing Technologist: GOliver HumRVT  Examination Guidelines: A complete evaluation includes B-mode imaging, spectral Doppler, color Doppler, and power Doppler as needed of all accessible portions of each vessel. Bilateral testing is considered an integral part of a complete examination. Limited examinations for reoccurring indications may be performed as noted. The reflux portion of the exam is performed with the patient in reverse Trendelenburg.  +---------+---------------+---------+-----------+----------+--------------+ RIGHT    CompressibilityPhasicitySpontaneityPropertiesThrombus Aging +---------+---------------+---------+-----------+----------+--------------+ CFV      Full           Yes      Yes                                 +---------+---------------+---------+-----------+----------+--------------+  SFJ      Full                                                        +---------+---------------+---------+-----------+----------+--------------+ FV Prox  Full                                                        +---------+---------------+---------+-----------+----------+--------------+ FV Mid   Full                                                        +---------+---------------+---------+-----------+----------+--------------+ FV DistalFull                                                        +---------+---------------+---------+-----------+----------+--------------+ PFV      Full                                                        +---------+---------------+---------+-----------+----------+--------------+ POP      Full           Yes      Yes                                  +---------+---------------+---------+-----------+----------+--------------+ PTV      Full                                                        +---------+---------------+---------+-----------+----------+--------------+ PERO     Full                                                        +---------+---------------+---------+-----------+----------+--------------+   +---------+---------------+---------+-----------+----------+--------------+ LEFT     CompressibilityPhasicitySpontaneityPropertiesThrombus Aging +---------+---------------+---------+-----------+----------+--------------+ CFV      Full           Yes      Yes                                 +---------+---------------+---------+-----------+----------+--------------+ SFJ      Full                                                        +---------+---------------+---------+-----------+----------+--------------+   FV Prox  Full                                                        +---------+---------------+---------+-----------+----------+--------------+ FV Mid   Full                                                        +---------+---------------+---------+-----------+----------+--------------+ FV DistalFull                                                        +---------+---------------+---------+-----------+----------+--------------+ PFV      Full                                                        +---------+---------------+---------+-----------+----------+--------------+ POP      Full           Yes      Yes                                 +---------+---------------+---------+-----------+----------+--------------+ PTV      Full                                                        +---------+---------------+---------+-----------+----------+--------------+ PERO     Full                                                         +---------+---------------+---------+-----------+----------+--------------+     Summary: RIGHT: - There is no evidence of deep vein thrombosis in the lower extremity.  - No cystic structure found in the popliteal fossa.  LEFT: - There is no evidence of deep vein thrombosis in the lower extremity.  - No cystic structure found in the popliteal fossa.  *See table(s) above for measurements and observations. Electronically signed by Monica Martinez MD on 01/26/2020 at 4:56:52 PM.    Final    ECHOCARDIOGRAM LIMITED  Result Date: 01/26/2020    ECHOCARDIOGRAM LIMITED REPORT   Patient Name:   Sara Ferguson Date of Exam: 01/26/2020 Medical Rec #:  [id number]      Height:       64.0 in Accession #:    [id number]     Weight:       168.0 lb Date of Birth:  September 18, 1945      BSA:          1.817 m Patient Age:    21 years  BP:           175/125 mmHg Patient Gender: F              HR:           111 bpm. Exam Location:  Inpatient Procedure: Cardiac Doppler, Color Doppler and Limited Echo REPORT CONTAINS CRITICAL RESULT Indications:    I31.3 Pericardial effusion (noninflammatory) Respiratory                 failure.  History:        Patient has prior history of Echocardiogram examinations, most                 recent 01/01/2020. Abnormal ECG, Signs/Symptoms:Shortness of                 Breath and Dyspnea; Risk Factors:Hypertension and Diabetes.                 Cancer. Pericardial effusion. Pleural effusion.  Sonographer:    Roseanna Rainbow RDCS Referring Phys: [id number] Donita Brooks  Sonographer Comments: Technically difficult study due to poor echo windows. IMPRESSIONS  1. Left ventricular ejection fraction, by estimation, is 55 to 60%. The left ventricle has normal function. The left ventricle has no regional wall motion abnormalities.  2. Right ventricular systolic function is normal. The right ventricular size is normal.  3. Moderate pericardial effusion surrounds heart Measurese about 14 mm in maximal dimension. Ther is mild  RV/RA indentation Mitral inflow is without respiratory variation. IVC is dilated and does not change significantly with inspiration. Overall does not meet full echo criteria for tamponade. Clinical correlation indicated. Compared to previous echo, effusion is circumferential Page sent to Dr Lonny Prude. Large pleural effusion.  4. The mitral valve is abnormal. Trivial mitral valve regurgitation.  5. The aortic valve is tricuspid. Mild aortic valve sclerosis is present, with no evidence of aortic valve stenosis.  6. The inferior vena cava is dilated in size with <50% respiratory variability, suggesting right atrial pressure of 15 mmHg. FINDINGS  Left Ventricle: Left ventricular ejection fraction, by estimation, is 55 to 60%. The left ventricle has normal function. The left ventricle has no regional wall motion abnormalities. Right Ventricle: The right ventricular size is normal. Right vetricular wall thickness was not assessed. Right ventricular systolic function is normal. Right Atrium: Right atrial size was normal in size. Pericardium: Moderate pericardial effusion surrounds heart Measurese about 14 mm in maximal dimension. Ther is mild RV/RA indentation Mitral inflow is without respiratory variation. IVC is dilated and does not change significantly with inspiration. Overall does not meet full echo criteria for tamponade. Clinical correlation indicated. Compared to previous echo, effusion is circumferential Page sent to Dr Lonny Prude. The pericardial effusion is circumferential. Mitral Valve: The mitral valve is abnormal. There is mild thickening of the mitral valve leaflet(s). Mild mitral annular calcification. Trivial mitral valve regurgitation. Tricuspid Valve: The tricuspid valve is grossly normal. Tricuspid valve regurgitation is trivial. Aortic Valve: The aortic valve is tricuspid. Mild aortic valve sclerosis is present, with no evidence of aortic valve stenosis. Aorta: The aortic root is normal in size and structure.  Venous: The inferior vena cava is dilated in size with less than 50% respiratory variability, suggesting right atrial pressure of 15 mmHg. Additional Comments: There is a large pleural effusion. LEFT VENTRICLE PLAX 2D LVIDd:         2.95 cm LVIDs:         2.10 cm LV PW:  1.85 cm LV IVS:        1.35 cm LVOT diam:     1.60 cm LVOT Area:     2.01 cm  LEFT ATRIUM         Index LA diam:    2.10 cm 1.16 cm/m   AORTA Ao Root diam: 2.65 cm  SHUNTS Systemic Diam: 1.60 cm Dorris Carnes MD Electronically signed by Dorris Carnes MD Signature Date/Time: 01/26/2020/12:03:22 PM    Final    US Thoracentesis Asp Pleural space w/IMG guide  Result Date: 02/23/2020 INDICATION: Patient with history of lung cancer, recurrent left pleural effusion. Request made for therapeutic left thoracentesis. EXAM: ULTRASOUND GUIDED THERAPEUTIC LEFT THORACENTESIS MEDICATIONS: 1% lidocaine to skin and subcutaneous tissue COMPLICATIONS: None immediate. PROCEDURE: An ultrasound guided thoracentesis was thoroughly discussed with the patient and questions answered. The benefits, risks, alternatives and complications were also discussed. The patient understands and wishes to proceed with the procedure. Written consent was obtained. Ultrasound was performed to localize and mark an adequate pocket of fluid in the left chest. The area was then prepped and draped in the normal sterile fashion. 1% Lidocaine was used for local anesthesia. Under ultrasound guidance a 6 Fr Safe-T-Centesis catheter was introduced. Thoracentesis was performed. The catheter was removed and a dressing applied. FINDINGS: A total of approximately 300 cc of yellow fluid was removed. IMPRESSION: Successful ultrasound guided therapeutic left thoracentesis yielding 300 cc of pleural fluid. Read by: Rowe Robert, PA-C Electronically Signed   By: Ruthann Cancer MD   On: 02/23/2020 16:00    Pathology  12/30/2019  Component 2 wk ago  CYTOLOGY - NON GYN CYTOLOGY - NON PAP   THIS IS  AN ADDENDUM REPORT  CASE: [id number]  PATIENT: Sara Ferguson  Non-Gynecological Cytology Report     Clinical History: None provided  Specimen Submitted: A. PLEURAL FLUID, LEFT, THORACENTESIS:    FINAL MICROSCOPIC DIAGNOSIS:  - Malignant cells consistent with non-small cell carcinoma  - See comment.   SPECIMEN ADEQUACY:  Satisfactory for evaluation   DIAGNOSTIC COMMENTS:  Immunohistochemistry will be performed and reported as an addendum.     Component 9 d ago  CYTOLOGY - NON GYN CYTOLOGY - NON PAP  CASE: [id number]  PATIENT: Sara Ferguson  Non-Gynecological Cytology Report      Clinical History: None provided  Specimen Submitted: A. PLEURAL FLUID, LEFT, THORACENTESIS:    FINAL MICROSCOPIC DIAGNOSIS:  - Malignant cells consistent with metastatic adenocarcinoma   SPECIMEN ADEQUACY:  Satisfactory for evaluation        Reviewed recent ED visit records.  I spent 20 minutes taking care of this patient including H and P, review of records, counseling and coordination of care.  I connected with  [name] on 02/24/20 by telephone application and verified that I am speaking with the correct person using two identifiers. In this case niece provided most of the history, patient didn't participate in the phone call today but was at home.  I discussed the limitations of evaluation and management by telemedicine. The patient expressed understanding and agreed to proceed.     [name], MD 02/24/2020 8:22 AM

## 2020-02-28 ENCOUNTER — Other Ambulatory Visit (HOSPITAL_COMMUNITY): Payer: Self-pay

## 2020-03-01 ENCOUNTER — Inpatient Hospital Stay (HOSPITAL_BASED_OUTPATIENT_CLINIC_OR_DEPARTMENT_OTHER): Payer: Self-pay | Admitting: Hematology and Oncology

## 2020-03-01 ENCOUNTER — Encounter: Payer: Self-pay | Admitting: Hematology and Oncology

## 2020-03-01 ENCOUNTER — Other Ambulatory Visit: Payer: Self-pay

## 2020-03-01 ENCOUNTER — Inpatient Hospital Stay: Payer: Self-pay

## 2020-03-01 VITALS — BP 187/66 | HR 99 | Temp 97.4°F | Resp 18 | Ht 64.0 in | Wt 149.3 lb

## 2020-03-01 DIAGNOSIS — C3492 Malignant neoplasm of unspecified part of left bronchus or lung: Secondary | ICD-10-CM

## 2020-03-01 LAB — CBC WITH DIFFERENTIAL/PLATELET
Abs Immature Granulocytes: 0.01 10*3/uL (ref 0.00–0.07)
Basophils Absolute: 0.1 10*3/uL (ref 0.0–0.1)
Basophils Relative: 1 %
Eosinophils Absolute: 0.1 10*3/uL (ref 0.0–0.5)
Eosinophils Relative: 2 %
HCT: 38.3 % (ref 36.0–46.0)
Hemoglobin: 12.2 g/dL (ref 12.0–15.0)
Immature Granulocytes: 0 %
Lymphocytes Relative: 29 %
Lymphs Abs: 1.6 10*3/uL (ref 0.7–4.0)
MCH: 27.6 pg (ref 26.0–34.0)
MCHC: 31.9 g/dL (ref 30.0–36.0)
MCV: 86.7 fL (ref 80.0–100.0)
Monocytes Absolute: 0.6 10*3/uL (ref 0.1–1.0)
Monocytes Relative: 12 %
Neutro Abs: 3 10*3/uL (ref 1.7–7.7)
Neutrophils Relative %: 56 %
Platelets: 313 10*3/uL (ref 150–400)
RBC: 4.42 MIL/uL (ref 3.87–5.11)
RDW: 13.5 % (ref 11.5–15.5)
WBC: 5.3 10*3/uL (ref 4.0–10.5)
nRBC: 0 % (ref 0.0–0.2)

## 2020-03-01 LAB — CMP (CANCER CENTER ONLY)
ALT: 13 U/L (ref 0–44)
AST: 18 U/L (ref 15–41)
Albumin: 3.7 g/dL (ref 3.5–5.0)
Alkaline Phosphatase: 77 U/L (ref 38–126)
Anion gap: 12 (ref 5–15)
BUN: 15 mg/dL (ref 8–23)
CO2: 23 mmol/L (ref 22–32)
Calcium: 9.3 mg/dL (ref 8.9–10.3)
Chloride: 99 mmol/L (ref 98–111)
Creatinine: 0.77 mg/dL (ref 0.44–1.00)
GFR, Estimated: >60 mL/min (ref >60)
Glucose, Bld: 119 mg/dL — ABNORMAL HIGH (ref 70–99)
Potassium: 3.2 mmol/L — ABNORMAL LOW (ref 3.5–5.1)
Sodium: 134 mmol/L — ABNORMAL LOW (ref 135–145)
Total Bilirubin: 0.6 mg/dL (ref 0.3–1.2)
Total Protein: 9.5 g/dL — ABNORMAL HIGH (ref 6.5–8.1)

## 2020-03-01 NOTE — Progress Notes (Signed)
[facility] CONSULT NOTE  Patient Care Team: [name], MD as PCP - General (Hematology and Oncology) Brien Mates, RN as Oncology Nurse Navigator (Oncology)  CHIEF COMPLAINTS/PURPOSE OF CONSULTATION:   Adenocarcinoma of the lung  ASSESSMENT & PLAN:  No problem-specific Assessment & Plan notes found for this encounter.  This is a very pleasant 75 year old female patient with past medical history significant for hypertension, diabetes mellitus type 2 referred to hematology and medical oncology for new diagnosis of adenocarcinoma of the lung. Ms. Harbeck arrived to the appointment today with her niece [name].  According to patient, she has been having symptoms for the past 4 to 5 months predominantly shortness of breath, cough without any hemoptysis and some weight loss which she attributed to the dietary changes.  She was admitted at [facility] with the above-mentioned complaints and had imaging suggestive of pleural effusion and left lung, mild to moderate pericardial effusion and changes reflecting primary bronchogenic carcinoma grossly stable left axillary confluent mediastinal and hilar adenopathy.  CT abdomen with left adrenal nodule suggestive of possible metastatic disease.  She had cytology which confirmed the diagnosis of adenocarcinoma.  Molecular spending. 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 MR brain negative for malignancy. She was found to have  EGFR exon 19 deletion which is targetable, will start first line tagrisso. I have clearly explained that this is a stage IV lung cancer and the intent of treatment is palliative. We have discussed about side effects of tagrisso including fatigue, skin rash, diarrhea, EKG changes, interstitial lung disease etc. She started tagrisso about Mid Dec 2022. Ok to continue current dose.  2. HTN,recommended to get a BP machine and log BP daily She is on norvasc and metoprolol at this time.  3. BLE swelling, continue lasix 20 mg daily, improving.  4. Weight loss, encouraged meals three times daily and nutritional supplements.  PLAN Continue tagrisso CBC, CMP today FU in 4 weeks. Imaging in Late Feb to assess response.  Thank you for consulting [place] in the care of this patient.  Please do not hesitate to contact us with any additional questions or concerns.  Orders Placed This Encounter  Procedures  . CBC with Differential/Platelet    Standing Status:   Standing    Number of Occurrences:   22    Standing Expiration Date:   03/01/2021  . Comprehensive metabolic panel    Standing Status:   Standing    Number of Occurrences:   33    Standing Expiration Date:   03/01/2021    HISTORY OF PRESENTING ILLNESS:   [name] 75 y.o. female is here because of new diagnosis of adenocarcinoma lung.  Chronology  75 year old female with history of hypertension, diabetes presented to the ED with hypoxia shortness of breath found to have large left-sided pleural effusion as well as pericardial effusion. Patient tells me that she had SOB for about 5 months prior to presentation and she initially thought it was bronchitis. She has also noted some weight loss of about 15 lbs and attributed this to dietary changes. Cough noted, lot of  phlegm , no hemoptysis. A day before presentation to the hospital, she noticed that she could not walk to the mailbox and hence went to the hospital. Patient was admitted seen  by pulmonary, concern for underlying malignancy underwent thoracentesis 11/12 and cytology came back positive for non-small cell lung cancer. Repeat chest x-ray 11/15 showed persistent consolidation/effusion of the left lower lobe, and repeat thoracentesis performed 11/17 with 850 mL fluid removal. Patient underwent CT chest showed improved aeration of the left lung with residual left infrahilar masslike density possibly reflecting the primary bronchogenic carcinoma, grossly stable left axillary confluent mediastinal and hilar adenopathy consistent with metastatic disease, left adrenal nodule suspicious for metastasis, new small dependent right pleural effusion stable pericardial effusion. CT abdomen pelvis showed no evidence of primary intra abdominal or intrapelvic malignancy. Partial visualization of heterogeneously enhancing left lower lung opacities as well as Left pleural effusion. 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 There is a 2.4 cm LEFT adrenal nodule. Findings are concerning for metastatic disease.  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 MR brain negative for malignancy.  Foundation one EGFR exon 19 deletion. Started tagrisso Dec 2nd week 2021.  Niece provided most of the history today. Niece says her aunt is feeling more tired and weak since the past 2 weeks, eating less, may have lost a couple lbs. She didn't however feel a need  for thoracentesis until yesterday, so went about 4 weeks without needing one. No chest pain or worsening SOB, niece couldn't take her to the [place]. She is now vaccinated for COVID 19.  She never smoked or chewed any tobacco products No occupational exposure. Worked in [employer] (medical) [employer], mom died of liver cancer in 35's, sister died of breast cancer in 40's She hasnt had mammogram in some time, hasnt taken COVID vaccine.  Interim history  She is here by her self for the appointment, another niece drove her. BLE swelling improving SOB better, fatigue is better, able to do small chores. Appetite is not great, losing weight,  No diarrhea No change in urinary habits No new headaches, fevers, or other new neurological complaints.  Rest of the 10 point ROS reviewed and negative.  MEDICAL HISTORY:  Past Medical History:  Diagnosis Date  . Arthritis   . Diabetes mellitus without complication ([place])   . Hypertension   . Non-small cell lung cancer ([place])    Dx 12/30/19 by thoracentesis     SURGICAL HISTORY: Past Surgical History:  Procedure Laterality Date  . ABDOMINAL HYSTERECTOMY    . ECTOPIC PREGNANCY SURGERY      SOCIAL HISTORY: Social History   Socioeconomic History  . Marital status: Widowed    Spouse name: Not on file  . Number of children: Not on file  . Years of education: Not on file  . Highest education level: Not on file  Occupational History  . Not on file  Tobacco Use  . Smoking status: Never Smoker  . Smokeless tobacco: Never Used  Substance and Sexual Activity  . Alcohol use: Not Currently  . Drug use: Never  . Sexual  activity: Not on file  Other Topics Concern  . Not on file  Social History Narrative  . Not on file   Social Determinants of Health   Financial Resource Strain: Low Risk   . Difficulty of Paying Living Expenses: Not hard at all  Food Insecurity: No Food Insecurity  . Worried About Charity fundraiser in the Last Year: Never true  . Ran Out of  Food in the Last Year: Never true  Transportation Needs: No Transportation Needs  . Lack of Transportation (Medical): No  . Lack of Transportation (Non-Medical): No  Physical Activity: Inactive  . Days of Exercise per Week: 0 days  . Minutes of Exercise per Session: 0 min  Stress: No Stress Concern Present  . Feeling of Stress : Not at all  Social Connections: Socially Isolated  . Frequency of Communication with Friends and Family: Once a week  . Frequency of Social Gatherings with Friends and Family: Once a week  . Attends Religious Services: Never  . Active Member of Clubs or Organizations: No  . Attends Archivist Meetings: 1 to 4 times per year  . Marital Status: Widowed  Intimate Partner Violence: Not At Risk  . Fear of Current or Ex-Partner: No  . Emotionally Abused: No  . Physically Abused: No  . Sexually Abused: No    FAMILY HISTORY: No family history on file.  ALLERGIES:  has No Known Allergies.  MEDICATIONS:  Current Outpatient Medications  Medication Sig Dispense Refill  . amLODipine (NORVASC) 5 MG tablet Take 1 tablet (5 mg total) by mouth daily. 30 tablet 1  . colchicine 0.6 MG tablet Take 1 tablet (0.6 mg total) by mouth 2 (two) times daily. 90 tablet 1  . furosemide (LASIX) 20 MG tablet Take 1 tablet (20 mg total) by mouth daily. 30 tablet 0  . guaifenesin (ROBITUSSIN) 100 MG/5ML syrup Take 200 mg by mouth 3 (three) times daily as needed for cough.    . hydrocortisone cream 1 % Apply 1 application topically daily as needed for itching (rash).    . metoprolol tartrate (LOPRESSOR) 25 MG tablet Take 1 tablet (25 mg total) by mouth 2 (two) times daily. 60 tablet 1  . osimertinib mesylate (TAGRISSO) 80 MG tablet Take 1 tablet (80 mg total) by mouth daily. Days 1-28. 30 tablet 11  . tetrahydrozoline 0.05 % ophthalmic solution Place 1 drop into both eyes daily as needed (dry, itchy eyes).    Marland Kitchen albuterol (VENTOLIN HFA) 108 (90 Base) MCG/ACT inhaler Inhale 2  puffs into the lungs every 6 (six) hours as needed for wheezing or shortness of breath. 1 each 1  . potassium chloride (KLOR-CON) 10 MEQ tablet Take 1 tablet (10 mEq total) by mouth daily for 7 days. 7 tablet 0   No current facility-administered medications for this visit.     PHYSICAL EXAMINATION: ECOG PERFORMANCE STATUS: 2 - Symptomatic, <50% confined to bed  Vitals:   03/01/20 1557  BP: (!) 187/66  Pulse: 99  Resp: 18  Temp: (!) 97.4 F (36.3 C)  SpO2: 97%   Filed Weights   03/01/20 1557  Weight: 149 lb 4.8 oz (67.7 kg)   Physical Exam Constitutional:      General: She is not in acute distress.    Appearance: Normal appearance. She is not toxic-appearing.  HENT:     Head: Normocephalic and atraumatic.     Mouth/Throat:     Mouth: Mucous membranes are moist.  Cardiovascular:  Rate and Rhythm: Normal rate and regular rhythm.     Pulses: Normal pulses.     Heart sounds: Normal heart sounds.  Pulmonary:     Effort: Pulmonary effort is normal.     Breath sounds: Normal breath sounds.     Comments: Diminished air entry LLL Abdominal:     General: Abdomen is flat.     Palpations: Abdomen is soft.  Musculoskeletal:        General: Swelling (BLE swelling improved) present. Normal range of motion.  Skin:    General: Skin is warm and dry.  Neurological:     General: No focal deficit present.     Mental Status: She is alert and oriented to person, place, and time.  Psychiatric:        Mood and Affect: Mood normal.        Behavior: Behavior normal.      LABORATORY DATA:  I have reviewed the data as listed Lab Results  Component Value Date   WBC 5.3 03/01/2020   HGB 12.2 03/01/2020   HCT 38.3 03/01/2020   MCV 86.7 03/01/2020   PLT 313 03/01/2020     Chemistry      Component Value Date/Time   NA 134 (L) 02/15/2020 2042   K 3.3 (L) 02/15/2020 2042   CL 102 02/15/2020 2042   CO2 24 02/15/2020 2042   BUN 13 02/15/2020 2042   CREATININE 0.66 02/15/2020  2042      Component Value Date/Time   CALCIUM 8.8 (L) 02/15/2020 2042   ALKPHOS 105 02/15/2020 2042   AST 20 02/15/2020 2042   ALT 14 02/15/2020 2042   BILITOT 0.8 02/15/2020 2042       RADIOGRAPHIC STUDIES: I have personally reviewed the radiological images as listed and agreed with the findings in the report. DG Chest 1 View  Result Date: 02/23/2020 CLINICAL DATA:  Status post left thoracentesis. EXAM: CHEST  1 VIEW COMPARISON:  01/26/2020 FINDINGS: Unchanged cardiomegaly. No pneumothorax status post left thoracentesis. Interval decrease of left pleural effusion. Left basilar opacity persists. IMPRESSION: No pneumothorax status post left thoracentesis. Electronically Signed   By: Miachel Roux M.D.   On: 02/23/2020 16:09   US Thoracentesis Asp Pleural space w/IMG guide  Result Date: 02/23/2020 INDICATION: Patient with history of lung cancer, recurrent left pleural effusion. Request made for therapeutic left thoracentesis. EXAM: ULTRASOUND GUIDED THERAPEUTIC LEFT THORACENTESIS MEDICATIONS: 1% lidocaine to skin and subcutaneous tissue COMPLICATIONS: None immediate. PROCEDURE: An ultrasound guided thoracentesis was thoroughly discussed with the patient and questions answered. The benefits, risks, alternatives and complications were also discussed. The patient understands and wishes to proceed with the procedure. Written consent was obtained. Ultrasound was performed to localize and mark an adequate pocket of fluid in the left chest. The area was then prepped and draped in the normal sterile fashion. 1% Lidocaine was used for local anesthesia. Under ultrasound guidance a 6 Fr Safe-T-Centesis catheter was introduced. Thoracentesis was performed. The catheter was removed and a dressing applied. FINDINGS: A total of approximately 300 cc of yellow fluid was removed. IMPRESSION: Successful ultrasound guided therapeutic left thoracentesis yielding 300 cc of pleural fluid. Read by: Rowe Robert, PA-C  Electronically Signed   By: Ruthann Cancer MD   On: 02/23/2020 16:00    Pathology  12/30/2019  Component 2 wk ago  CYTOLOGY - NON GYN CYTOLOGY - NON PAP   THIS IS AN ADDENDUM REPORT  CASE: [id number]  PATIENT: [name]  Non-Gynecological Cytology Report  Clinical History: None provided  Specimen Submitted: A. PLEURAL FLUID, LEFT, THORACENTESIS:    FINAL MICROSCOPIC DIAGNOSIS:  - Malignant cells consistent with non-small cell carcinoma  - See comment.   SPECIMEN ADEQUACY:  Satisfactory for evaluation   DIAGNOSTIC COMMENTS:  Immunohistochemistry will be performed and reported as an addendum.     Component 9 d ago  CYTOLOGY - NON GYN CYTOLOGY - NON PAP  CASE: [id number]  PATIENT: Fransisca Tomkins  Non-Gynecological Cytology Report      Clinical History: None provided  Specimen Submitted: A. PLEURAL FLUID, LEFT, THORACENTESIS:    FINAL MICROSCOPIC DIAGNOSIS:  - Malignant cells consistent with metastatic adenocarcinoma   SPECIMEN ADEQUACY:  Satisfactory for evaluation        Reviewed recent ED visit records.  I spent 20 minutes taking care of this patient including H and P, review of records, counseling and coordination of care.      [name], MD 03/01/2020 4:39 PM

## 2020-03-02 ENCOUNTER — Telehealth: Payer: Self-pay | Admitting: Hematology and Oncology

## 2020-03-02 ENCOUNTER — Other Ambulatory Visit: Payer: Self-pay | Admitting: Hematology and Oncology

## 2020-03-02 MED ORDER — POTASSIUM CHLORIDE ER 10 MEQ PO TBCR: 20.0000 meq | EXTENDED_RELEASE_TABLET | Freq: Every day | ORAL | 2 refills | Status: DC

## 2020-03-02 NOTE — Telephone Encounter (Signed)
Scheduled appointment per 1/13 los. Spoke to patient's niece who is aware of appointment date and time.

## 2020-03-06 ENCOUNTER — Other Ambulatory Visit: Payer: Self-pay | Admitting: Hematology and Oncology

## 2020-03-06 ENCOUNTER — Telehealth: Payer: Self-pay

## 2020-03-06 MED ORDER — AMLODIPINE BESYLATE 5 MG PO TABS
5.0000 mg | ORAL_TABLET | Freq: Every day | ORAL | 1 refills | Status: DC
Start: 2020-03-06 — End: 2020-03-07

## 2020-03-06 NOTE — Telephone Encounter (Signed)
-----   Message from [name], MD sent at 03/02/2020  3:42 PM EST ----- 2 a day, refill sent to [facility] on chart.  ----- Message ----- From: Burna Mortimer, CMA Sent: 03/02/2020   2:36 PM EST To: [name], MD  Dr. [name],  The Patient's niece confirms that they received a prescription on 12/29 from Dr. Almyra Free but it had a dispense of only 7 tablets so the Patient is not currently taking any potassium, she has finished that prescription. The niece is asking you to please send in a refill and clarify if she should be taking two or one per day.  Thank you, Myriam Jacobson ----- Message ----- From: [name], MD Sent: 03/02/2020   8:15 AM EST To: Burna Mortimer, CMA  Sara Ferguson  Potassium is low,  I see she is on potassium chloride, please confirm she is taking it. If yes, ask her to take 2 tabs a day instead of one. I am happy to send a refill if she needs it.  Thanks,

## 2020-03-06 NOTE — Telephone Encounter (Signed)
Patient's niece confirms that they picked up her most recent prescription of potassium and verbalized understanding that the Patient is to take two per day.

## 2020-03-07 ENCOUNTER — Other Ambulatory Visit: Payer: Self-pay

## 2020-03-07 DIAGNOSIS — I10 Essential (primary) hypertension: Secondary | ICD-10-CM

## 2020-03-07 MED ORDER — AMLODIPINE BESYLATE 5 MG PO TABS
5.0000 mg | ORAL_TABLET | Freq: Every day | ORAL | 1 refills | Status: DC
Start: 2020-03-07 — End: 2020-03-07

## 2020-03-07 MED ORDER — AMLODIPINE BESYLATE 5 MG PO TABS
5.0000 mg | ORAL_TABLET | Freq: Every day | ORAL | 1 refills | Status: DC
Start: 2020-03-07 — End: 2020-05-21

## 2020-03-22 ENCOUNTER — Other Ambulatory Visit: Payer: Self-pay

## 2020-03-22 MED ORDER — FUROSEMIDE 20 MG PO TABS: 20.0000 mg | ORAL_TABLET | Freq: Every day | ORAL | 3 refills | Status: DC

## 2020-03-29 ENCOUNTER — Inpatient Hospital Stay: Payer: Self-pay | Attending: Physician Assistant | Admitting: Hematology and Oncology

## 2020-03-29 ENCOUNTER — Inpatient Hospital Stay: Payer: Self-pay

## 2020-03-29 ENCOUNTER — Other Ambulatory Visit: Payer: Self-pay

## 2020-03-29 VITALS — BP 177/62 | HR 81 | Temp 98.0°F | Resp 17 | Ht 64.0 in | Wt 144.5 lb

## 2020-03-29 DIAGNOSIS — C3432 Malignant neoplasm of lower lobe, left bronchus or lung: Secondary | ICD-10-CM | POA: Insufficient documentation

## 2020-03-29 DIAGNOSIS — C3492 Malignant neoplasm of unspecified part of left bronchus or lung: Secondary | ICD-10-CM

## 2020-03-29 DIAGNOSIS — I313 Pericardial effusion (noninflammatory): Secondary | ICD-10-CM | POA: Insufficient documentation

## 2020-03-29 DIAGNOSIS — E119 Type 2 diabetes mellitus without complications: Secondary | ICD-10-CM | POA: Insufficient documentation

## 2020-03-29 DIAGNOSIS — R634 Abnormal weight loss: Secondary | ICD-10-CM | POA: Insufficient documentation

## 2020-03-29 DIAGNOSIS — I10 Essential (primary) hypertension: Secondary | ICD-10-CM | POA: Insufficient documentation

## 2020-03-29 DIAGNOSIS — M7989 Other specified soft tissue disorders: Secondary | ICD-10-CM | POA: Insufficient documentation

## 2020-03-29 DIAGNOSIS — Z79899 Other long term (current) drug therapy: Secondary | ICD-10-CM | POA: Insufficient documentation

## 2020-03-29 DIAGNOSIS — J90 Pleural effusion, not elsewhere classified: Secondary | ICD-10-CM | POA: Insufficient documentation

## 2020-03-29 LAB — CMP (CANCER CENTER ONLY)
ALT: 8 U/L (ref 0–44)
AST: 13 U/L — ABNORMAL LOW (ref 15–41)
Albumin: 3.5 g/dL (ref 3.5–5.0)
Alkaline Phosphatase: 63 U/L (ref 38–126)
Anion gap: 9 (ref 5–15)
BUN: 18 mg/dL (ref 8–23)
CO2: 25 mmol/L (ref 22–32)
Calcium: 9.4 mg/dL (ref 8.9–10.3)
Chloride: 105 mmol/L (ref 98–111)
Creatinine: 0.81 mg/dL (ref 0.44–1.00)
GFR, Estimated: >60 mL/min (ref >60)
Glucose, Bld: 128 mg/dL — ABNORMAL HIGH (ref 70–99)
Potassium: 3.9 mmol/L (ref 3.5–5.1)
Sodium: 139 mmol/L (ref 135–145)
Total Bilirubin: 0.5 mg/dL (ref 0.3–1.2)
Total Protein: 9.1 g/dL — ABNORMAL HIGH (ref 6.5–8.1)

## 2020-03-29 LAB — CBC WITH DIFFERENTIAL/PLATELET
Abs Immature Granulocytes: 0.01 10*3/uL (ref 0.00–0.07)
Basophils Absolute: 0 10*3/uL (ref 0.0–0.1)
Basophils Relative: 1 %
Eosinophils Absolute: 0 10*3/uL (ref 0.0–0.5)
Eosinophils Relative: 1 %
HCT: 39.2 % (ref 36.0–46.0)
Hemoglobin: 12.7 g/dL (ref 12.0–15.0)
Immature Granulocytes: 0 %
Lymphocytes Relative: 37 %
Lymphs Abs: 1.5 10*3/uL (ref 0.7–4.0)
MCH: 28.2 pg (ref 26.0–34.0)
MCHC: 32.4 g/dL (ref 30.0–36.0)
MCV: 86.9 fL (ref 80.0–100.0)
Monocytes Absolute: 0.5 10*3/uL (ref 0.1–1.0)
Monocytes Relative: 13 %
Neutro Abs: 2 10*3/uL (ref 1.7–7.7)
Neutrophils Relative %: 48 %
Platelets: 257 10*3/uL (ref 150–400)
RBC: 4.51 MIL/uL (ref 3.87–5.11)
RDW: 13.5 % (ref 11.5–15.5)
WBC: 4.1 10*3/uL (ref 4.0–10.5)
nRBC: 0 % (ref 0.0–0.2)

## 2020-03-30 ENCOUNTER — Encounter: Payer: Self-pay | Admitting: Hematology and Oncology

## 2020-03-30 NOTE — Progress Notes (Signed)
Sara Ferguson CONSULT NOTE  Patient Care Team: [name], MD as PCP - General (Hematology and Oncology) Brien Mates, RN as Oncology Nurse Navigator (Oncology)  CHIEF COMPLAINTS/PURPOSE OF CONSULTATION:   Adenocarcinoma of the lung  ASSESSMENT & PLAN:  No problem-specific Assessment & Plan notes found for this encounter.  This is a very pleasant 75 year old female patient with past medical history significant for hypertension, diabetes mellitus type 2 referred to hematology and medical oncology for new diagnosis of adenocarcinoma of the lung. Sara Ferguson arrived to the appointment today with her niece Sara Ferguson.  According to patient, she has been having symptoms for the past 4 to 5 months predominantly shortness of breath, cough without any hemoptysis and some weight loss which she attributed to the dietary changes.  She was admitted at [facility] with the above-mentioned complaints and had imaging suggestive of pleural effusion and left lung, mild to moderate pericardial effusion and changes reflecting primary bronchogenic carcinoma grossly stable left axillary confluent mediastinal and hilar adenopathy.  CT abdomen with left adrenal nodule suggestive of possible metastatic disease.  She had cytology which confirmed the diagnosis of adenocarcinoma.  Molecular spending. 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 MR brain negative for malignancy. She was found to have  EGFR exon 19 deletion which is targetable, will start first line tagrisso. I have clearly explained that this is a stage IV lung cancer and the intent of treatment is palliative. We have discussed about side effects of tagrisso including fatigue, skin rash, diarrhea, EKG changes, interstitial lung disease etc. She started tagrisso about Mid Dec 2022. She is doing remarkably well, has not felt a need to go to thoracentesis. PE, still palpable but smaller cervical lymphadenopathy. She will continue current dose of tagrisso, and repeat imaging and RTC in 4 weeks.  2. HTN,recommended to get a BP machine and log BP daily She is on norvasc at this time. She says her BP at home is not high. Advised that she can double on amlodipine if the SBP is greater than 150 and DBP greater than 100 on daily labs. Encouraged following up with PCP for monitoring HTN  3. BLE swelling, continue lasix 20 mg daily, improving.  4. Weight loss, encouraged meals three times daily and nutritional supplements.  PLAN  Continue tagrisso CBC, CMP today FU in 4 weeks. Imaging in Late Feb to assess response. Ordered Sent referral to primary care also to assist in the management of HTN  Thank you for consulting [place] in the care of this patient.  Please do not hesitate to contact us with any additional questions or concerns.  Orders Placed This Encounter  Procedures  . CT CHEST ABDOMEN PELVIS W CONTRAST    Standing Status:   Future    Standing Expiration Date:   03/29/2021    Order Specific Question:   Preferred imaging location?    Answer:   [facility]    Order Specific Question:   Radiology Contrast Protocol - do NOT remove file path    Answer:   [email or web address]  . CBC with Differential/Platelet  Standing Status:   Standing    Number of Occurrences:   22    Standing Expiration Date:   03/29/2021  . CMP ([place] only)    Standing Status:   Future     Number of Occurrences:   1    Standing Expiration Date:   03/29/2021  . Ambulatory referral to Internal Medicine    Referral Priority:   Routine    Referral Type:   Consultation    Referral Reason:   Specialty Services Required    Requested Specialty:   Internal Medicine    Number of Visits Requested:   1    HISTORY OF PRESENTING ILLNESS:   Sara Ferguson 75 y.o. female is here because of new diagnosis of adenocarcinoma lung.  Chronology  75 year old female with history of hypertension, diabetes presented to the ED with hypoxia shortness of breath found to have large left-sided pleural effusion as well as pericardial effusion. Patient tells me that she had SOB for about 5 months prior to presentation and she initially thought it was bronchitis. She has also noted some weight loss of about 15 lbs and attributed this to dietary changes. Cough noted, lot of phlegm , no hemoptysis. A day before presentation to the hospital, she noticed that she could not walk to the mailbox and hence went to the hospital. 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 Repeat chest x-ray 11/15 showed persistent consolidation/effusion of the left lower lobe, and repeat thoracentesis performed 11/17 with 850 mL fluid removal. Patient underwent CT chest showed improved aeration of the left lung with residual left infrahilar masslike density possibly reflecting the primary bronchogenic carcinoma, grossly stable left axillary confluent mediastinal and hilar adenopathy consistent with metastatic disease, left adrenal nodule suspicious for metastasis, new small dependent right pleural effusion stable pericardial effusion. CT abdomen pelvis showed no evidence of primary intra abdominal or intrapelvic malignancy. Partial visualization of heterogeneously enhancing left lower lung opacities as well as Left  pleural effusion. 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 There is a 2.4 cm LEFT adrenal nodule. Findings are concerning for metastatic disease.  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 MR brain negative for malignancy.  Foundation one EGFR exon 19 deletion. Started tagrisso Dec 2nd week 2021.  Niece provided most of the history today. Niece says her aunt is feeling more tired and weak since the past 2 weeks, eating less, may have lost a couple lbs. She didn't however feel a need for thoracentesis until yesterday, so went about 4 weeks without needing one. No chest pain or worsening SOB, niece couldn't take her to the [place]. She is now vaccinated for COVID 19.  She never smoked or chewed any tobacco products No occupational exposure. Worked in [employer] (medical) [employer], mom died of liver cancer in 20's, sister died of breast cancer in 75's She hasnt had mammogram in some time, hasnt taken COVID vaccine.  Interim history  She is here by her self for the appointment, another niece drove  her. BLE swelling improving SOB better, fatigue is better, able to do small chores.  Appetite is much better, eating better Energy is much better. No diarrhea No change in urinary habits No new headaches, fevers, or other new neurological complaints.  Rest of the 10 point ROS reviewed and negative.  MEDICAL HISTORY:  Past Medical History:  Diagnosis Date  .  Arthritis   . Diabetes mellitus without complication ([place])   . Hypertension   . Non-small cell lung cancer ([place])    Dx 12/30/19 by thoracentesis     SURGICAL HISTORY: Past Surgical History:  Procedure Laterality Date  . ABDOMINAL HYSTERECTOMY    . ECTOPIC PREGNANCY SURGERY      SOCIAL HISTORY: Social History   Socioeconomic History  . Marital status: Widowed    Spouse name: Not on file  . Number of children: Not on file  . Years of education: Not on file  . Highest education level: Not on file  Occupational History  . Not on file  Tobacco Use  . Smoking status: Never Smoker  . Smokeless tobacco: Never Used  Substance and Sexual Activity  . Alcohol use: Not Currently  . Drug use: Never  . Sexual activity: Not on file  Other Topics Concern  . Not on file  Social History Narrative  . Not on file   Social Determinants of Health   Financial Resource Strain: Low Risk   . Difficulty of Paying Living Expenses: Not hard at all  Food Insecurity: No Food Insecurity  . Worried About Charity fundraiser in the Last Year: Never true  . Ran Out of Food in the Last Year: Never true  Transportation Needs: No Transportation Needs  . Lack of Transportation (Medical): No  . Lack of Transportation (Non-Medical): No  Physical Activity: Inactive  . Days of Exercise per Week: 0 days  . Minutes of Exercise per Session: 0 min  Stress: No Stress Concern Present  . Feeling of Stress : Not at all  Social Connections: Socially Isolated  . Frequency of Communication with Friends and Family: Once a week  . Frequency of Social Gatherings with Friends and Family: Once a week  . Attends Religious Services: Never  . Active Member of Clubs or Organizations: No  . Attends Archivist Meetings: 1 to 4 times per year  . Marital Status: Widowed  Intimate Partner Violence: Not At Risk  . Fear of Current or Ex-Partner: No  . Emotionally Abused: No  . Physically Abused: No  . Sexually  Abused: No    FAMILY HISTORY: No family history on file.  ALLERGIES:  has No Known Allergies.  MEDICATIONS:  Current Outpatient Medications  Medication Sig Dispense Refill  . albuterol (VENTOLIN HFA) 108 (90 Base) MCG/ACT inhaler Inhale 2 puffs into the lungs every 6 (six) hours as needed for wheezing or shortness of breath. 1 each 1  . amLODipine (NORVASC) 5 MG tablet Take 1 tablet (5 mg total) by mouth daily. 30 tablet 1  . colchicine 0.6 MG tablet Take 1 tablet (0.6 mg total) by mouth 2 (two) times daily. 90 tablet 1  . furosemide (LASIX) 20 MG tablet Take 1 tablet (20 mg total) by mouth daily. 30 tablet 3  . guaifenesin (ROBITUSSIN) 100 MG/5ML syrup Take 200 mg by mouth 3 (three) times daily as needed for cough.    . hydrocortisone cream 1 % Apply 1 application topically daily as needed for itching (rash).    . metoprolol tartrate (  LOPRESSOR) 25 MG tablet Take 1 tablet (25 mg total) by mouth 2 (two) times daily. 60 tablet 1  . osimertinib mesylate (TAGRISSO) 80 MG tablet Take 1 tablet (80 mg total) by mouth daily. Days 1-28. 30 tablet 11  . potassium chloride (KLOR-CON) 10 MEQ tablet Take 2 tablets (20 mEq total) by mouth daily. 60 tablet 2  . tetrahydrozoline 0.05 % ophthalmic solution Place 1 drop into both eyes daily as needed (dry, itchy eyes).     No current facility-administered medications for this visit.     PHYSICAL EXAMINATION: ECOG PERFORMANCE STATUS: 2 - Symptomatic, <50% confined to bed  Vitals:   03/29/20 1558 03/29/20 1600  BP: (!) 177/60 (!) 177/62  Pulse: 81   Resp: 17   Temp: 98 F (36.7 C)   SpO2: 100%    Filed Weights   03/29/20 1558  Weight: 144 lb 8 oz (65.5 kg)   Physical Exam Constitutional:      General: She is not in acute distress.    Appearance: Normal appearance. She is not toxic-appearing.  HENT:     Head: Normocephalic and atraumatic.     Mouth/Throat:     Mouth: Mucous membranes are moist.  Cardiovascular:     Rate and Rhythm:  Normal rate and regular rhythm.     Pulses: Normal pulses.     Heart sounds: Normal heart sounds.  Pulmonary:     Effort: Pulmonary effort is normal.     Breath sounds: Normal breath sounds.     Comments: CTA Bilaterally Abdominal:     General: Abdomen is flat.     Palpations: Abdomen is soft.  Musculoskeletal:        General: Swelling (BLE swelling improved) present. Normal range of motion.     Right lower leg: Edema present.     Left lower leg: Edema present.  Lymphadenopathy:     Cervical: Cervical adenopathy present.     Comments: Multiple small cervical and supraclavicular LN still palpable but improved in size.  Skin:    General: Skin is warm and dry.  Neurological:     General: No focal deficit present.     Mental Status: She is alert and oriented to person, place, and time.  Psychiatric:        Mood and Affect: Mood normal.        Behavior: Behavior normal.      LABORATORY DATA:  I have reviewed the data as listed Lab Results  Component Value Date   WBC 4.1 03/29/2020   HGB 12.7 03/29/2020   HCT 39.2 03/29/2020   MCV 86.9 03/29/2020   PLT 257 03/29/2020     Chemistry      Component Value Date/Time   NA 139 03/29/2020 1621   K 3.9 03/29/2020 1621   CL 105 03/29/2020 1621   CO2 25 03/29/2020 1621   BUN 18 03/29/2020 1621   CREATININE 0.81 03/29/2020 1621      Component Value Date/Time   CALCIUM 9.4 03/29/2020 1621   ALKPHOS 63 03/29/2020 1621   AST 13 (L) 03/29/2020 1621   ALT 8 03/29/2020 1621   BILITOT 0.5 03/29/2020 1621       RADIOGRAPHIC STUDIES: I have personally reviewed the radiological images as listed and agreed with the findings in the report. No results found.  Pathology  12/30/2019  Component 2 wk ago  CYTOLOGY - NON GYN CYTOLOGY - NON PAP   THIS IS AN ADDENDUM REPORT  CASE: [id number]  PATIENT:  Sara Ferguson  Non-Gynecological Cytology Report     Clinical History: None provided  Specimen Submitted: A. PLEURAL FLUID,  LEFT, THORACENTESIS:    FINAL MICROSCOPIC DIAGNOSIS:  - Malignant cells consistent with non-small cell carcinoma  - See comment.   SPECIMEN ADEQUACY:  Satisfactory for evaluation   DIAGNOSTIC COMMENTS:  Immunohistochemistry will be performed and reported as an addendum.     Component 9 d ago  CYTOLOGY - NON GYN CYTOLOGY - NON PAP  CASE: [id number]  PATIENT: Sara Ferguson  Non-Gynecological Cytology Report      Clinical History: None provided  Specimen Submitted: A. PLEURAL FLUID, LEFT, THORACENTESIS:    FINAL MICROSCOPIC DIAGNOSIS:  - Malignant cells consistent with metastatic adenocarcinoma   SPECIMEN ADEQUACY:  Satisfactory for evaluation        Reviewed recent ED visit records.  I spent 30 minutes taking care of this patient including H and P, review of records, counseling and coordination of care.      [name], MD 03/30/2020 3:18 PM

## 2020-04-03 ENCOUNTER — Telehealth: Payer: Self-pay

## 2020-04-03 NOTE — Telephone Encounter (Signed)
Pts niece, Diona Fanti left a message requesting a refill of the following:  Colchicine Tetrahydrozoline eye drops  I have called Ms. Hollad back and advised these are not rx's prescribed by Dr. [name] and the pt would need to follow up with her PCP and request these refills.

## 2020-04-06 ENCOUNTER — Telehealth: Payer: Self-pay

## 2020-04-06 NOTE — Telephone Encounter (Signed)
Faxed referral to [facility] [place] with receipt of confirmation to arrange a PCP for the patient. I contacted [facility] and confirmed with them that they had received the fax and that they would be reaching out to the patient.

## 2020-04-19 ENCOUNTER — Encounter (HOSPITAL_COMMUNITY): Payer: Self-pay

## 2020-04-19 ENCOUNTER — Ambulatory Visit (HOSPITAL_COMMUNITY)
Admission: RE | Admit: 2020-04-19 | Discharge: 2020-04-19 | Disposition: A | Discharge status: Home / Self Care | Payer: Self-pay | Source: Ambulatory Visit | Attending: Hematology and Oncology | Admitting: Hematology and Oncology

## 2020-04-19 ENCOUNTER — Other Ambulatory Visit: Payer: Self-pay

## 2020-04-19 ENCOUNTER — Ambulatory Visit (HOSPITAL_COMMUNITY): Admission: RE | Admit: 2020-04-19 | Payer: Self-pay | Source: Ambulatory Visit

## 2020-04-19 DIAGNOSIS — C3492 Malignant neoplasm of unspecified part of left bronchus or lung: Secondary | ICD-10-CM | POA: Insufficient documentation

## 2020-04-19 MED ORDER — IOHEXOL 300 MG/ML  SOLN
100.0000 mL | Freq: Once | INTRAMUSCULAR | Status: AC | PRN
  Administered 2020-04-19: 100 mL via INTRAVENOUS

## 2020-04-26 ENCOUNTER — Inpatient Hospital Stay: Payer: Self-pay

## 2020-04-26 ENCOUNTER — Other Ambulatory Visit: Payer: Self-pay

## 2020-04-26 ENCOUNTER — Telehealth: Payer: Self-pay

## 2020-04-26 ENCOUNTER — Inpatient Hospital Stay: Payer: Self-pay | Attending: Physician Assistant | Admitting: Hematology and Oncology

## 2020-04-26 ENCOUNTER — Encounter: Payer: Self-pay | Admitting: Hematology and Oncology

## 2020-04-26 VITALS — BP 183/70 | HR 85 | Temp 97.5°F | Resp 15 | Ht 64.0 in | Wt 144.5 lb

## 2020-04-26 DIAGNOSIS — I10 Essential (primary) hypertension: Secondary | ICD-10-CM | POA: Insufficient documentation

## 2020-04-26 DIAGNOSIS — C3432 Malignant neoplasm of lower lobe, left bronchus or lung: Secondary | ICD-10-CM | POA: Insufficient documentation

## 2020-04-26 DIAGNOSIS — C3492 Malignant neoplasm of unspecified part of left bronchus or lung: Secondary | ICD-10-CM

## 2020-04-26 DIAGNOSIS — E119 Type 2 diabetes mellitus without complications: Secondary | ICD-10-CM | POA: Insufficient documentation

## 2020-04-26 DIAGNOSIS — M7989 Other specified soft tissue disorders: Secondary | ICD-10-CM | POA: Insufficient documentation

## 2020-04-26 DIAGNOSIS — Z79899 Other long term (current) drug therapy: Secondary | ICD-10-CM | POA: Insufficient documentation

## 2020-04-26 LAB — CMP (CANCER CENTER ONLY)
ALT: 23 U/L (ref 0–44)
AST: 24 U/L (ref 15–41)
Albumin: 3.7 g/dL (ref 3.5–5.0)
Alkaline Phosphatase: 74 U/L (ref 38–126)
Anion gap: 7 (ref 5–15)
BUN: 15 mg/dL (ref 8–23)
CO2: 25 mmol/L (ref 22–32)
Calcium: 9.6 mg/dL (ref 8.9–10.3)
Chloride: 104 mmol/L (ref 98–111)
Creatinine: 0.79 mg/dL (ref 0.44–1.00)
GFR, Estimated: >60 mL/min (ref >60)
Glucose, Bld: 104 mg/dL — ABNORMAL HIGH (ref 70–99)
Potassium: 4.6 mmol/L (ref 3.5–5.1)
Sodium: 136 mmol/L (ref 135–145)
Total Bilirubin: 0.4 mg/dL (ref 0.3–1.2)
Total Protein: 9.2 g/dL — ABNORMAL HIGH (ref 6.5–8.1)

## 2020-04-26 LAB — CBC WITH DIFFERENTIAL/PLATELET
Abs Immature Granulocytes: 0.01 10*3/uL (ref 0.00–0.07)
Basophils Absolute: 0 10*3/uL (ref 0.0–0.1)
Basophils Relative: 0 %
Eosinophils Absolute: 0 10*3/uL (ref 0.0–0.5)
Eosinophils Relative: 1 %
HCT: 38.8 % (ref 36.0–46.0)
Hemoglobin: 12.7 g/dL (ref 12.0–15.0)
Immature Granulocytes: 0 %
Lymphocytes Relative: 28 %
Lymphs Abs: 1.4 10*3/uL (ref 0.7–4.0)
MCH: 28.5 pg (ref 26.0–34.0)
MCHC: 32.7 g/dL (ref 30.0–36.0)
MCV: 87.2 fL (ref 80.0–100.0)
Monocytes Absolute: 0.4 10*3/uL (ref 0.1–1.0)
Monocytes Relative: 8 %
Neutro Abs: 3 10*3/uL (ref 1.7–7.7)
Neutrophils Relative %: 63 %
Platelets: 244 10*3/uL (ref 150–400)
RBC: 4.45 MIL/uL (ref 3.87–5.11)
RDW: 13.3 % (ref 11.5–15.5)
WBC: 4.9 10*3/uL (ref 4.0–10.5)
nRBC: 0 % (ref 0.0–0.2)

## 2020-04-26 NOTE — Progress Notes (Signed)
[facility] CONSULT NOTE  Patient Care Team: [name], MD as PCP - General (Hematology and Oncology) Brien Mates, RN as Oncology Nurse Navigator (Oncology)  CHIEF COMPLAINTS/PURPOSE OF CONSULTATION:   Adenocarcinoma of the lung  ASSESSMENT & PLAN:  No problem-specific Assessment & Plan notes found for this encounter.  This is a very pleasant 75 year old female patient with past medical history significant for hypertension, diabetes mellitus type 2 referred to hematology and medical oncology for follow-up on osimertinib for her adenocarcinoma of the lung.  She has been tolerating osimertinib 80 mg very well.  She had some diarrhea which she attributes to colchicine and this diarrhea has resolved when she stopped the colchicine.  She absolutely denies any other side effects.  She feels more energetic and is able to participate in some of the chores at home.  Her breathing has significantly improved. Physical examination, decreasing cervical lymphadenopathy, barely palpable.  No overt pleural or pericardial effusion on exam.  Recent imaging with improvement in her adenocarcinoma as expected.  Next imaging in about 3 months. She will continue osimertinib as instructed and repeat labs today. Return to clinic in 4 weeks  2. HTN again uncontrolled.  We have reviewed some time and she understands increased risk of strokes and heart attacks with uncontrolled hypertension.  I have told her that I do not have experience with managing refractory hypertension and she needs to go back to her primary care physician to help take care of this. She will call Dr. Jenny Reichmann in [place] and go back for follow-up. I do not believe she takes all her recommended medications, she stopped taking lisinopril because someone asked her to. I reassured her that there is no interaction between lisinopril and osimertinib and again it is best to stay with her primary care doctor for management of  hypertension.   3. BLE swelling, continue lasix 20 mg daily, 1+, significantly better.  4. Weight loss, resolved. PLAN  Continue tagrisso CBC, CMP today FU in 4 weeks. Imaging in Late Feb to assess response. Ordered Sent referral to primary care also to assist in the management of HTN  Thank you for consulting [place] in the care of this patient.  Please do not hesitate to contact us with any additional questions or concerns.  No orders of the defined types were placed in this encounter.   HISTORY OF PRESENTING ILLNESS:   Sara Ferguson 75 y.o. female is here because of new diagnosis of adenocarcinoma lung.  Chronology  75 year old female with history of hypertension, diabetes presented to the ED with hypoxia shortness of breath found to have large left-sided pleural effusion as well as pericardial effusion. Patient tells me that she had SOB for about 5 months prior to presentation and she initially thought it was bronchitis. She has also noted some weight loss of about 15 lbs and attributed this to dietary changes. Cough noted, lot of phlegm , no hemoptysis. A day before presentation to the hospital, she noticed that she could not walk to the mailbox and hence went to the hospital. 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 Repeat chest x-ray 11/15 showed persistent consolidation/effusion of the left lower lobe, and repeat thoracentesis performed 11/17 with 850 mL fluid removal. Patient underwent CT chest showed improved aeration of the left lung with residual left infrahilar masslike density possibly reflecting the primary bronchogenic carcinoma, grossly stable left axillary confluent  mediastinal and hilar adenopathy consistent with metastatic disease, left adrenal nodule suspicious for metastasis, new small dependent right pleural effusion stable pericardial  effusion. CT abdomen pelvis showed no evidence of primary intra abdominal or intrapelvic malignancy. Partial visualization of heterogeneously enhancing left lower lung opacities as well as Left pleural effusion. 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 There is a 2.4 cm LEFT adrenal nodule. Findings are concerning for metastatic disease.  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 MR brain negative for malignancy.  Foundation one EGFR exon 19 deletion. Started tagrisso Dec 2nd week 2021.  Niece provided most of the history today. Niece says her aunt is feeling more tired and weak since the past 2 weeks, eating less, may have lost a couple lbs. She didn't however feel a need for thoracentesis until yesterday, so went about 4 weeks without needing one. No chest pain or worsening SOB, niece couldn't take her to the [place]. She is now vaccinated for COVID 19.  She never smoked or chewed any tobacco products No occupational exposure. Worked in [employer] (medical) [employer], mom died of liver cancer in 41's, sister died of breast cancer in 32's She hasnt had mammogram in some time, hasnt taken COVID vaccine.  Interim history  She is here by herself for follow-up on her osimertinib.  She continues to feel better, she is able to do some chores like bathrooms.  She is eating better, weight loss has resolved.  She is able to maintain her weight.  She has not noticed any  worsening shortness of breath, cough or lower extremity swelling.  She had some bad diarrhea after she took some colchicine and she stopped this.  She denies any change in bowel or urinary habits.  No new neurological complaints.  She is compliant with her osimertinib.  She tells me that some of her family members have hyperactive adrenal gland and hence blood pressure can be hard to control.  She was advised to talk to her PCP for further management of her high blood pressure.  No chest pain, chest pressure, shortness of breath, intractable headaches today.  Rest of the 10 point ROS reviewed and negative.  MEDICAL HISTORY:  Past Medical History:  Diagnosis Date  . Arthritis   . Diabetes mellitus without complication ([place])   . Hypertension   . Non-small cell lung cancer ([place])    Dx 12/30/19 by thoracentesis     SURGICAL HISTORY: Past Surgical History:  Procedure Laterality Date  . ABDOMINAL HYSTERECTOMY    . ECTOPIC PREGNANCY SURGERY      SOCIAL HISTORY: Social History   Socioeconomic History  . Marital status: Widowed    Spouse name: Not on file  . Number of children: Not on file  . Years of education: Not on file  . Highest education level: Not on file  Occupational History  . Not on file  Tobacco Use  . Smoking status: Never Smoker  . Smokeless tobacco: Never Used  Substance and Sexual Activity  . Alcohol use: Not Currently  . Drug use: Never  . Sexual activity: Not on  file  Other Topics Concern  . Not on file  Social History Narrative  . Not on file   Social Determinants of Health   Financial Resource Strain: Low Risk   . Difficulty of Paying Living Expenses: Not hard at all  Food Insecurity: No Food Insecurity  . Worried About Charity fundraiser in the Last Year: Never true  . Ran Out of Food in the Last Year: Never true  Transportation Needs: No Transportation Needs  . Lack of Transportation (Medical): No  . Lack of Transportation (Non-Medical): No  Physical  Activity: Inactive  . Days of Exercise per Week: 0 days  . Minutes of Exercise per Session: 0 min  Stress: No Stress Concern Present  . Feeling of Stress : Not at all  Social Connections: Socially Isolated  . Frequency of Communication with Friends and Family: Once a week  . Frequency of Social Gatherings with Friends and Family: Once a week  . Attends Religious Services: Never  . Active Member of Clubs or Organizations: No  . Attends Archivist Meetings: 1 to 4 times per year  . Marital Status: Widowed  Intimate Partner Violence: Not At Risk  . Fear of Current or Ex-Partner: No  . Emotionally Abused: No  . Physically Abused: No  . Sexually Abused: No    FAMILY HISTORY: No family history on file.  ALLERGIES:  has No Known Allergies.  MEDICATIONS:  Current Outpatient Medications  Medication Sig Dispense Refill  . albuterol (VENTOLIN HFA) 108 (90 Base) MCG/ACT inhaler Inhale 2 puffs into the lungs every 6 (six) hours as needed for wheezing or shortness of breath. 1 each 1  . amLODipine (NORVASC) 5 MG tablet Take 1 tablet (5 mg total) by mouth daily. 30 tablet 1  . colchicine 0.6 MG tablet Take 1 tablet (0.6 mg total) by mouth 2 (two) times daily. 90 tablet 1  . furosemide (LASIX) 20 MG tablet Take 1 tablet (20 mg total) by mouth daily. 30 tablet 3  . guaifenesin (ROBITUSSIN) 100 MG/5ML syrup Take 200 mg by mouth 3 (three) times daily as needed for cough.    . hydrocortisone cream 1 % Apply 1 application topically daily as needed for itching (rash).    . metoprolol tartrate (LOPRESSOR) 25 MG tablet Take 1 tablet (25 mg total) by mouth 2 (two) times daily. 60 tablet 1  . osimertinib mesylate (TAGRISSO) 80 MG tablet Take 1 tablet (80 mg total) by mouth daily. Days 1-28. 30 tablet 11  . potassium chloride (KLOR-CON) 10 MEQ tablet Take 2 tablets (20 mEq total) by mouth daily. 60 tablet 2  . tetrahydrozoline 0.05 % ophthalmic solution Place 1 drop into both eyes daily as needed  (dry, itchy eyes).     No current facility-administered medications for this visit.     PHYSICAL EXAMINATION: ECOG PERFORMANCE STATUS: 2 - Symptomatic, <50% confined to bed  There were no vitals filed for this visit. There were no vitals filed for this visit. Physical Exam Constitutional:      General: She is not in acute distress.    Appearance: Normal appearance. She is not toxic-appearing.  HENT:     Head: Normocephalic and atraumatic.     Mouth/Throat:     Mouth: Mucous membranes are moist.  Cardiovascular:     Rate and Rhythm: Normal rate and regular rhythm.     Pulses: Normal pulses.     Heart sounds: Normal heart sounds.  Pulmonary:     Effort:  Pulmonary effort is normal.     Breath sounds: Normal breath sounds.     Comments: CTA Bilaterally Abdominal:     General: Abdomen is flat.     Palpations: Abdomen is soft.  Musculoskeletal:        General: Swelling (BLE swelling improved) present. Normal range of motion.     Right lower leg: Edema present.     Left lower leg: Edema present.  Lymphadenopathy:     Cervical: Cervical adenopathy (Significantly improved compared to last visit) present.     Comments: Barely palpable lower cervical lymphadenopathy  Skin:    General: Skin is warm and dry.  Neurological:     General: No focal deficit present.     Mental Status: She is alert and oriented to person, place, and time.  Psychiatric:        Mood and Affect: Mood normal.        Behavior: Behavior normal.      LABORATORY DATA:  I have reviewed the data as listed Lab Results  Component Value Date   WBC 4.1 03/29/2020   HGB 12.7 03/29/2020   HCT 39.2 03/29/2020   MCV 86.9 03/29/2020   PLT 257 03/29/2020     Chemistry      Component Value Date/Time   NA 139 03/29/2020 1621   K 3.9 03/29/2020 1621   CL 105 03/29/2020 1621   CO2 25 03/29/2020 1621   BUN 18 03/29/2020 1621   CREATININE 0.81 03/29/2020 1621      Component Value Date/Time   CALCIUM 9.4  03/29/2020 1621   ALKPHOS 63 03/29/2020 1621   AST 13 (L) 03/29/2020 1621   ALT 8 03/29/2020 1621   BILITOT 0.5 03/29/2020 1621       RADIOGRAPHIC STUDIES: I have personally reviewed the radiological images as listed and agreed with the findings in the report. CT CHEST ABDOMEN PELVIS W CONTRAST  Result Date: 04/20/2020 CLINICAL DATA:  Lung cancer restaging EXAM: CT CHEST, ABDOMEN, AND PELVIS WITH CONTRAST TECHNIQUE: Multidetector CT imaging of the chest, abdomen and pelvis was performed following the standard protocol during bolus administration of intravenous contrast. CONTRAST:  150m OMNIPAQUE IOHEXOL 300 MG/ML SOLN, additional oral enteric contrast COMPARISON:  PET-CT, 01/09/2020 CT chest, 01/04/2020, CT abdomen pelvis, 12/31/2019 FINDINGS: CT CHEST FINDINGS Cardiovascular: Aortic atherosclerosis. Normal heart size. Unchanged small pericardial effusion. Mediastinum/Nodes: Significant interval decrease in size of numerous matted, enlarged mediastinal and hilar as well as left axillary lymph nodes, largest pretracheal nodes measuring 1.8 x 1.2 cm, previously 3.4 x 2.4 cm (series 2, image 19). Thyroid gland, trachea, and esophagus demonstrate no significant findings. Lungs/Pleura: Significant interval decrease in consolidation of the perihilar and infrahilar left lower lobe, the most discretely masslike lesion in the dependent left lower lobe measuring approximately 2.4 x 2.0 cm, previously 3.6 x 3.4 cm when measured similarly (series 2, image 38). Unchanged small left pleural effusion associated atelectasis or consolidation, as well as associated pleural thickening. Musculoskeletal: No chest wall mass. Interval development of multiple sclerotic osseous lesions of the ribs and vertebral body, for example of the T3 vertebral body (series 6, image 86). CT ABDOMEN PELVIS FINDINGS Hepatobiliary: No solid liver abnormality is seen. No gallstones, gallbladder wall thickening, or biliary dilatation. Pancreas:  Unremarkable. No pancreatic ductal dilatation or surrounding inflammatory changes. Spleen: Normal in size without significant abnormality. Adrenals/Urinary Tract: Unchanged soft tissue attenuation nodule of the left adrenal gland measuring 2.1 x 1.9 cm (series 2, image 49). Kidneys are normal, without  renal calculi, solid lesion, or hydronephrosis. Bladder is unremarkable. Stomach/Bowel: Stomach is within normal limits. Appendix appears normal. No evidence of bowel wall thickening, distention, or inflammatory changes. Vascular/Lymphatic: Aortic atherosclerosis. Retrocrural periaortic lymphadenopathy is resolved (series 2, image 46). No enlarged abdominal or pelvic lymph nodes. Reproductive: No mass or other abnormality. Other: No abdominal wall hernia or abnormality. No abdominopelvic ascites. Musculoskeletal: Interval development of multiple sclerotic osseous lesions of the vertebral bodies and pelvis, for example the L3 vertebral body (series 6, image 95). IMPRESSION: 1. Significant interval decrease in consolidation of the perihilar and infrahilar left lower lobe, the most discretely masslike lesion in the dependent left lower lobe measuring approximately 2.4 x 2.0 cm, previously 3.6 x 3.4 cm when measured similarly and previously FDG avid. 2. Significant interval decrease in size of numerous matted, enlarged mediastinal and hilar as well as left axillary lymph nodes, consistent with treatment response. 3. Unchanged small malignant left pleural effusion and associated pleural thickening. 4. Unchanged small pericardial effusion, presumably malignant. 5. Interval development of multiple sclerotic osseous lesions of the ribs, vertebral bodies, and pelvis, consistent with post treatment sclerosis of osseous metastatic disease previously established by PET. 6. Unchanged soft tissue attenuation nodule of the left adrenal gland measuring 2.1 x 1.9 cm; this remains suspicious for an adrenal metastasis. 7. Above  constellation of findings is consistent with treatment response primary lung malignancy as well as widespread metastatic disease. Aortic Atherosclerosis (ICD10-I70.0). Electronically Signed   By: Eddie Candle M.D.   On: 04/20/2020 14:28    Pathology  12/30/2019  Component 2 wk ago  CYTOLOGY - NON GYN CYTOLOGY - NON PAP   THIS IS AN ADDENDUM REPORT  CASE: [id number]  PATIENT: Sara Ferguson  Non-Gynecological Cytology Report     Clinical History: None provided  Specimen Submitted: A. PLEURAL FLUID, LEFT, THORACENTESIS:    FINAL MICROSCOPIC DIAGNOSIS:  - Malignant cells consistent with non-small cell carcinoma  - See comment.   SPECIMEN ADEQUACY:  Satisfactory for evaluation   DIAGNOSTIC COMMENTS:  Immunohistochemistry will be performed and reported as an addendum.     Component 9 d ago  CYTOLOGY - NON GYN CYTOLOGY - NON PAP  CASE: [id number]  PATIENT: Sara Ferguson  Non-Gynecological Cytology Report      Clinical History: None provided  Specimen Submitted: A. PLEURAL FLUID, LEFT, THORACENTESIS:    FINAL MICROSCOPIC DIAGNOSIS:  - Malignant cells consistent with metastatic adenocarcinoma   SPECIMEN ADEQUACY:  Satisfactory for evaluation        Reviewed recent ED visit records.  I spent 30 minutes taking care of this patient including H and P, review of records, counseling and coordination of care.      [name], MD 04/26/2020 2:40 PM

## 2020-04-26 NOTE — Telephone Encounter (Signed)
I contacted Ms. Cherry's niece, [name], to inform her that Ms. Scheck needed to follow up with Dr. Merrilee Jansky office for a repeat Echo as soon as possible. I gave [name] the number to Dr. Merrilee Jansky office as well as the office address. I advised [name] to follow up with me if a referral was needed for any reason.

## 2020-05-19 ENCOUNTER — Other Ambulatory Visit: Payer: Self-pay | Admitting: Hematology and Oncology

## 2020-05-19 DIAGNOSIS — I10 Essential (primary) hypertension: Secondary | ICD-10-CM

## 2020-05-24 ENCOUNTER — Encounter: Payer: Self-pay | Admitting: Hematology and Oncology

## 2020-05-24 ENCOUNTER — Other Ambulatory Visit: Payer: Self-pay

## 2020-05-24 ENCOUNTER — Inpatient Hospital Stay: Payer: Self-pay | Attending: Physician Assistant | Admitting: Hematology and Oncology

## 2020-05-24 ENCOUNTER — Inpatient Hospital Stay: Payer: Self-pay

## 2020-05-24 VITALS — BP 163/58 | HR 86 | Temp 97.5°F | Resp 18 | Ht 64.0 in | Wt 143.5 lb

## 2020-05-24 DIAGNOSIS — Z90710 Acquired absence of both cervix and uterus: Secondary | ICD-10-CM | POA: Insufficient documentation

## 2020-05-24 DIAGNOSIS — C3492 Malignant neoplasm of unspecified part of left bronchus or lung: Secondary | ICD-10-CM

## 2020-05-24 DIAGNOSIS — J90 Pleural effusion, not elsewhere classified: Secondary | ICD-10-CM | POA: Insufficient documentation

## 2020-05-24 DIAGNOSIS — C3432 Malignant neoplasm of lower lobe, left bronchus or lung: Secondary | ICD-10-CM | POA: Insufficient documentation

## 2020-05-24 DIAGNOSIS — Z803 Family history of malignant neoplasm of breast: Secondary | ICD-10-CM | POA: Insufficient documentation

## 2020-05-24 DIAGNOSIS — R634 Abnormal weight loss: Secondary | ICD-10-CM | POA: Insufficient documentation

## 2020-05-24 DIAGNOSIS — I313 Pericardial effusion (noninflammatory): Secondary | ICD-10-CM | POA: Insufficient documentation

## 2020-05-24 DIAGNOSIS — I10 Essential (primary) hypertension: Secondary | ICD-10-CM | POA: Insufficient documentation

## 2020-05-24 DIAGNOSIS — E279 Disorder of adrenal gland, unspecified: Secondary | ICD-10-CM | POA: Insufficient documentation

## 2020-05-24 DIAGNOSIS — Z808 Family history of malignant neoplasm of other organs or systems: Secondary | ICD-10-CM | POA: Insufficient documentation

## 2020-05-24 LAB — CBC WITH DIFFERENTIAL/PLATELET
Abs Immature Granulocytes: 0.02 10*3/uL (ref 0.00–0.07)
Basophils Absolute: 0 10*3/uL (ref 0.0–0.1)
Basophils Relative: 0 %
Eosinophils Absolute: 0 10*3/uL (ref 0.0–0.5)
Eosinophils Relative: 0 %
HCT: 38.7 % (ref 36.0–46.0)
Hemoglobin: 12.3 g/dL (ref 12.0–15.0)
Immature Granulocytes: 0 %
Lymphocytes Relative: 23 %
Lymphs Abs: 1.2 10*3/uL (ref 0.7–4.0)
MCH: 28.2 pg (ref 26.0–34.0)
MCHC: 31.8 g/dL (ref 30.0–36.0)
MCV: 88.8 fL (ref 80.0–100.0)
Monocytes Absolute: 0.6 10*3/uL (ref 0.1–1.0)
Monocytes Relative: 11 %
Neutro Abs: 3.5 10*3/uL (ref 1.7–7.7)
Neutrophils Relative %: 66 %
Platelets: 235 10*3/uL (ref 150–400)
RBC: 4.36 MIL/uL (ref 3.87–5.11)
RDW: 13.3 % (ref 11.5–15.5)
WBC: 5.3 10*3/uL (ref 4.0–10.5)
nRBC: 0 % (ref 0.0–0.2)

## 2020-05-24 LAB — COMPREHENSIVE METABOLIC PANEL
ALT: 16 U/L (ref 0–44)
AST: 17 U/L (ref 15–41)
Albumin: 3.7 g/dL (ref 3.5–5.0)
Alkaline Phosphatase: 80 U/L (ref 38–126)
Anion gap: 11 (ref 5–15)
BUN: 15 mg/dL (ref 8–23)
CO2: 23 mmol/L (ref 22–32)
Calcium: 9 mg/dL (ref 8.9–10.3)
Chloride: 104 mmol/L (ref 98–111)
Creatinine, Ser: 0.95 mg/dL (ref 0.44–1.00)
GFR, Estimated: >60 mL/min (ref >60)
Glucose, Bld: 117 mg/dL — ABNORMAL HIGH (ref 70–99)
Potassium: 4.1 mmol/L (ref 3.5–5.1)
Sodium: 138 mmol/L (ref 135–145)
Total Bilirubin: 0.5 mg/dL (ref 0.3–1.2)
Total Protein: 8.7 g/dL — ABNORMAL HIGH (ref 6.5–8.1)

## 2020-05-24 NOTE — Progress Notes (Signed)
[facility] CONSULT NOTE  Patient Care Team: [name], MD as PCP - General (Hematology and Oncology) Brien Mates, RN as Oncology Nurse Navigator (Oncology)  CHIEF COMPLAINTS/PURPOSE OF CONSULTATION:   Adenocarcinoma of the lung  ASSESSMENT & PLAN:   No problem-specific Assessment & Plan notes found for this encounter.  1. 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 She is currently on first line tagrisso and is doing well.  No concerning ROS except for some baseline cough and SOB PE today unremarkable, palpable lymphadenopathy right neck and supraclavicular region, stable. Recent imaging and clinically she is responding.  She still has palpable cervical and supraclavicular LN but this is stable.  2. HTN, she is now established with cardiology who are helping her manage her HTN. She says her BP is normal at home, but here she always her high blood pressure No cardiac complaints She was encouraged to stay in touch with her cardiologist.  Thank you for consulting [place] in the care of this patient.  Please do not hesitate to contact us with any additional questions or concerns.  Orders Placed This Encounter  Procedures  . CBC with Differential/Platelet    Standing Status:   Standing    Number of Occurrences:   22    Standing Expiration Date:   05/24/2021  . Comprehensive metabolic panel    Standing Status:   Standing    Number of Occurrences:   33    Standing Expiration Date:   05/24/2021    HISTORY OF PRESENTING ILLNESS:   Sara Ferguson 75 y.o. female is here because of new diagnosis of adenocarcinoma lung.  Chronology  75 year old female with history of hypertension, diabetes presented to the ED with hypoxia shortness of breath found  to have large left-sided pleural effusion as well as pericardial effusion. Patient tells me that she had SOB for about 5 months prior to presentation and she initially thought it was bronchitis. She has also noted some weight loss of about 15 lbs and attributed this to dietary changes. Cough noted, lot of phlegm , no hemoptysis. A day before presentation to the hospital, she noticed that she could not walk to the mailbox and hence went to the hospital. 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 Repeat chest x-ray 11/15 showed persistent consolidation/effusion of the left lower lobe, and repeat thoracentesis performed 11/17 with 850 mL fluid removal. Patient underwent CT chest showed improved aeration of the left lung with residual left infrahilar masslike density possibly reflecting the primary bronchogenic carcinoma, grossly stable left axillary confluent mediastinal and hilar adenopathy consistent with metastatic disease, left adrenal nodule suspicious for metastasis, new small dependent right pleural effusion stable pericardial effusion. CT abdomen pelvis showed no evidence of primary intra abdominal or intrapelvic malignancy. Partial visualization of heterogeneously enhancing left lower lung opacities as well as Left pleural effusion. 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 There is a 2.4 cm LEFT adrenal nodule. Findings are concerning for metastatic disease.  She had PET imaging which showed solid mass with opacities in the central left lower lobe and possibly the posterior inferior left lower lobe consistent with primary bronchogenic carcinoma, moderate multiloculated left pleural effusion and  findings consistent with diffuse lung lymphangitic spread of carcinoma, hypermetabolic lymphadenopathy throughout the chest, lower  neck and left retrocrural region consistent with metastatic disease and probable left adrenal metastasis, small to moderate pericardial effusion without associated FDG activity. MR brain negative for malignancy.  Foundation one EGFR exon 19 deletion. Started tagrisso Dec 2nd week 2021.  She never smoked or chewed any tobacco products No occupational exposure. Worked in [employer] (medical) [employer], mom died of liver cancer in 20's, sister died of breast cancer in 5's She hasnt had mammogram in some time, hasnt taken COVID vaccine.  Interim history  She is here by herself for follow-up on her osimertinib. She feels well mostly. She doesn't have too much energy but can do most of her chores. Climbing stairs makes her SOB. She denies any side effects with Tagrisso. No abdominal complaints or diarrhea. No chest pain, chest pressure, shortness of breath, intractable headaches today.   Rest of the 10 point ROS reviewed and negative.  MEDICAL HISTORY:  Past Medical History:  Diagnosis Date  . Arthritis   . Diabetes mellitus without complication ([place])   . Hypertension   . Non-small cell lung cancer ([place])    Dx 12/30/19 by thoracentesis     SURGICAL HISTORY: Past Surgical History:  Procedure Laterality Date  . ABDOMINAL HYSTERECTOMY    . ECTOPIC PREGNANCY SURGERY      SOCIAL HISTORY: Social History   Socioeconomic History  . Marital status: Widowed    Spouse name: Not on file  . Number of children: Not on file  . Years of education: Not on file  . Highest education level: Not on file  Occupational History  . Not on file  Tobacco Use  . Smoking status: Never Smoker  . Smokeless tobacco: Never Used  Substance and Sexual Activity  . Alcohol use: Not Currently  . Drug use: Never  . Sexual activity: Not on file  Other Topics Concern  . Not on file  Social History Narrative  . Not on file   Social Determinants of Health   Financial Resource Strain: Low Risk   . Difficulty of Paying Living  Expenses: Not hard at all  Food Insecurity: No Food Insecurity  . Worried About Charity fundraiser in the Last Year: Never true  . Ran Out of Food in the Last Year: Never true  Transportation Needs: No Transportation Needs  . Lack of Transportation (Medical): No  . Lack of Transportation (Non-Medical): No  Physical Activity: Inactive  . Days of Exercise per Week: 0 days  . Minutes of Exercise per Session: 0 min  Stress: No Stress Concern Present  . Feeling of Stress : Not at all  Social Connections: Socially Isolated  . Frequency of Communication with Friends and Family: Once a week  . Frequency of Social Gatherings with Friends and Family: Once a week  . Attends Religious Services: Never  . Active Member of Clubs or Organizations: No  . Attends Archivist Meetings: 1 to 4 times per year  . Marital Status: Widowed  Intimate Partner Violence: Not At Risk  . Fear of Current or Ex-Partner: No  . Emotionally Abused: No  . Physically Abused: No  . Sexually Abused: No    FAMILY HISTORY: No family history on file.  ALLERGIES:  has No Known Allergies.  MEDICATIONS:  Current Outpatient Medications  Medication Sig Dispense Refill  . amLODipine (NORVASC) 5 MG tablet TAKE 1 TABLET (5 MG TOTAL) BY MOUTH DAILY. 30 tablet  1  . colchicine 0.6 MG tablet Take 1 tablet (0.6 mg total) by mouth 2 (two) times daily. 90 tablet 1  . furosemide (LASIX) 20 MG tablet Take 1 tablet (20 mg total) by mouth daily. 30 tablet 3  . guaifenesin (ROBITUSSIN) 100 MG/5ML syrup Take 200 mg by mouth 3 (three) times daily as needed for cough.    . hydrocortisone cream 1 % Apply 1 application topically daily as needed for itching (rash).    Marland Kitchen osimertinib mesylate (TAGRISSO) 80 MG tablet Take 1 tablet (80 mg total) by mouth daily. Days 1-28. 30 tablet 11  . albuterol (VENTOLIN HFA) 108 (90 Base) MCG/ACT inhaler Inhale 2 puffs into the lungs every 6 (six) hours as needed for wheezing or shortness of breath. 1  each 1  . metoprolol tartrate (LOPRESSOR) 25 MG tablet Take 1 tablet (25 mg total) by mouth 2 (two) times daily. 60 tablet 1  . potassium chloride (KLOR-CON) 10 MEQ tablet Take 2 tablets (20 mEq total) by mouth daily. 60 tablet 2  . tetrahydrozoline 0.05 % ophthalmic solution Place 1 drop into both eyes daily as needed (dry, itchy eyes). (Patient not taking: Reported on 05/24/2020)     No current facility-administered medications for this visit.     PHYSICAL EXAMINATION: ECOG PERFORMANCE STATUS: 2 - Symptomatic, <50% confined to bed  Vitals:   05/24/20 1423 05/24/20 1450  BP: (!) 174/77 (!) 163/58  Pulse: 93 86  Resp: 18   Temp: (!) 97.5 F (36.4 C)   SpO2: 97%    Filed Weights   05/24/20 1423  Weight: 143 lb 8 oz (65.1 kg)   Physical Exam Constitutional:      General: She is not in acute distress.    Appearance: Normal appearance. She is not toxic-appearing.  HENT:     Head: Normocephalic and atraumatic.     Mouth/Throat:     Mouth: Mucous membranes are moist.  Cardiovascular:     Rate and Rhythm: Normal rate and regular rhythm.     Pulses: Normal pulses.     Heart sounds: Normal heart sounds.  Pulmonary:     Effort: Pulmonary effort is normal.     Breath sounds: Normal breath sounds.     Comments: CTA Bilaterally Abdominal:     General: Abdomen is flat.     Palpations: Abdomen is soft.  Musculoskeletal:        General: No swelling (BLE swelling improved). Normal range of motion.     Right lower leg: Edema (Trace) present.     Left lower leg: Edema (Trace) present.  Lymphadenopathy:     Cervical: Cervical adenopathy (palpable right cervical lymphadenopathy and left supraclavicular lymphadenopathy, stable to improved.) present.     Comments: Barely palpable lower cervical lymphadenopathy  Skin:    General: Skin is warm and dry.  Neurological:     General: No focal deficit present.     Mental Status: She is alert and oriented to person, place, and time.   Psychiatric:        Mood and Affect: Mood normal.        Behavior: Behavior normal.      LABORATORY DATA:  I have reviewed the data as listed Lab Results  Component Value Date   WBC 4.9 04/26/2020   HGB 12.7 04/26/2020   HCT 38.8 04/26/2020   MCV 87.2 04/26/2020   PLT 244 04/26/2020     Chemistry      Component Value Date/Time   NA  136 04/26/2020 1513   K 4.6 04/26/2020 1513   CL 104 04/26/2020 1513   CO2 25 04/26/2020 1513   BUN 15 04/26/2020 1513   CREATININE 0.79 04/26/2020 1513      Component Value Date/Time   CALCIUM 9.6 04/26/2020 1513   ALKPHOS 74 04/26/2020 1513   AST 24 04/26/2020 1513   ALT 23 04/26/2020 1513   BILITOT 0.4 04/26/2020 1513       RADIOGRAPHIC STUDIES: I have personally reviewed the radiological images as listed and agreed with the findings in the report. No results found.  Pathology  12/30/2019  Component 2 wk ago  CYTOLOGY - NON GYN CYTOLOGY - NON PAP   THIS IS AN ADDENDUM REPORT  CASE: [id number]  PATIENT: Sara Ferguson  Non-Gynecological Cytology Report     Clinical History: None provided  Specimen Submitted: A. PLEURAL FLUID, LEFT, THORACENTESIS:    FINAL MICROSCOPIC DIAGNOSIS:  - Malignant cells consistent with non-small cell carcinoma  - See comment.   SPECIMEN ADEQUACY:  Satisfactory for evaluation   DIAGNOSTIC COMMENTS:  Immunohistochemistry will be performed and reported as an addendum.     Component 9 d ago  CYTOLOGY - NON GYN CYTOLOGY - NON PAP  CASE: [id number]  PATIENT: Sara Ferguson  Non-Gynecological Cytology Report      Clinical History: None provided  Specimen Submitted: A. PLEURAL FLUID, LEFT, THORACENTESIS:    FINAL MICROSCOPIC DIAGNOSIS:  - Malignant cells consistent with metastatic adenocarcinoma   SPECIMEN ADEQUACY:  Satisfactory for evaluation        Reviewed recent imaging   I spent 30 minutes taking care of this patient including H and P, review of records,  counseling and coordination of care.      [name], MD 05/24/2020 3:01 PM

## 2020-05-25 ENCOUNTER — Other Ambulatory Visit: Payer: Self-pay | Admitting: Hematology and Oncology

## 2020-06-15 ENCOUNTER — Other Ambulatory Visit: Payer: Self-pay | Admitting: Hematology and Oncology

## 2020-06-15 DIAGNOSIS — I10 Essential (primary) hypertension: Secondary | ICD-10-CM

## 2020-06-19 ENCOUNTER — Other Ambulatory Visit: Payer: Self-pay | Admitting: Hematology and Oncology

## 2020-06-19 NOTE — Progress Notes (Signed)
I called [name] back and recommended patient follow up with her cardiologist or PCP for management of HTN since her blood pressure is poorly controlled. I explained that we are comfortable with the refills once the plan is modified but currently her anti hypertensive medications need to be changed. She expressed understanding of it I discussed that uncontrolled HTN will increase risk of stroke or other cardiovascular events.  Cliford Sequeira

## 2020-06-21 ENCOUNTER — Inpatient Hospital Stay: Payer: Self-pay | Attending: Physician Assistant | Admitting: Hematology and Oncology

## 2020-06-21 ENCOUNTER — Encounter: Payer: Self-pay | Admitting: Hematology and Oncology

## 2020-06-21 ENCOUNTER — Other Ambulatory Visit: Payer: Self-pay

## 2020-06-21 DIAGNOSIS — I10 Essential (primary) hypertension: Secondary | ICD-10-CM | POA: Insufficient documentation

## 2020-06-21 DIAGNOSIS — I3139 Other pericardial effusion (noninflammatory): Secondary | ICD-10-CM

## 2020-06-21 DIAGNOSIS — C3492 Malignant neoplasm of unspecified part of left bronchus or lung: Secondary | ICD-10-CM

## 2020-06-21 DIAGNOSIS — Z90710 Acquired absence of both cervix and uterus: Secondary | ICD-10-CM | POA: Insufficient documentation

## 2020-06-21 DIAGNOSIS — C3432 Malignant neoplasm of lower lobe, left bronchus or lung: Secondary | ICD-10-CM | POA: Insufficient documentation

## 2020-06-21 DIAGNOSIS — Z79899 Other long term (current) drug therapy: Secondary | ICD-10-CM | POA: Insufficient documentation

## 2020-06-21 DIAGNOSIS — I313 Pericardial effusion (noninflammatory): Secondary | ICD-10-CM

## 2020-06-21 NOTE — Assessment & Plan Note (Signed)
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 She is currently on first line tagrisso and is doing well.  No concerning ROS except for some fatigue. PE today unremarkable, Right neck cervical lymphadenopathy and left sided supraclavicular lymphadenopathy is smaller. Will continue tagrisso. Niece requested if we can reduce her visits. Will change labs to monthly and FU visits to every other month.

## 2020-06-21 NOTE — Assessment & Plan Note (Signed)
Appears refractory She is on norvasc, metoprolol and hydralazine May need dose titration. Recommended she contact her cardiologist for management of HTN

## 2020-06-21 NOTE — Assessment & Plan Note (Signed)
No evidence of pericardial effusion on exam today Loud S1 and S2. Last imaging with no evidence of significant pericardial effusion Will continue to monitor it clinically. She was encouraged to reach out to me or the cardiologist with questions.

## 2020-06-21 NOTE — Progress Notes (Signed)
[name] CONSULT NOTE  Patient Care Team: [name], MD as PCP - General (Hematology and Oncology) Brien Mates, RN as Oncology Nurse Navigator (Oncology)  CHIEF COMPLAINTS/PURPOSE OF CONSULTATION:   Adenocarcinoma of the lung  ASSESSMENT & PLAN:   Adenocarcinoma of left lung, stage 4 ([place])  This is a very pleasant 75 year old female patient with past medical history significant for hypertension, diabetes mellitus type 2 referred to hematology and medical oncology for follow-up on osimertinib for her adenocarcinoma of the lung EGFR exon 19 deletion. She is currently on first line tagrisso and is doing well.  No concerning ROS except for some fatigue. PE today unremarkable, Right neck cervical lymphadenopathy and left sided supraclavicular lymphadenopathy is smaller. Will continue tagrisso. Niece requested if we can reduce her visits. Will change labs to monthly and FU visits to every other month.   Essential hypertension Appears refractory She is on norvasc, metoprolol and hydralazine May need dose titration. Recommended she contact her cardiologist for management of HTN  Pericardial effusion No evidence of pericardial effusion on exam today Loud S1 and S2. Last imaging with no evidence of significant pericardial effusion Will continue to monitor it clinically. She was encouraged to reach out to me or the cardiologist with questions.   Orders Placed This Encounter  Procedures  . CBC with Differential/Platelet    Standing Status:   Standing    Number of Occurrences:   22    Standing Expiration Date:   06/21/2021  . Comprehensive metabolic panel    Standing Status:   Standing    Number of Occurrences:   33    Standing Expiration Date:   06/21/2021    HISTORY OF PRESENTING ILLNESS:   Sara Ferguson 75 y.o. female is here because of new diagnosis of adenocarcinoma lung.  Oncology History Overview Note  Chronology  75 year old female with  history of hypertension, diabetes presented to the ED with hypoxia shortness of breath found to have large left-sided pleural effusion as well as pericardial effusion. Patient tells me that she had SOB for about 5 months prior to presentation and she initially thought it was bronchitis. She has also noted some weight loss of about 15 lbs and attributed this to dietary changes. Cough noted, lot of phlegm , no hemoptysis. A day before presentation to the hospital, she noticed that she could not walk to the mailbox and hence went to the hospital. 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  Repeat chest x-ray 11/15 showed persistent consolidation/effusion of the left lower lobe, and repeat thoracentesis performed 11/17 with 850 mL fluid removal. Patient underwent CT chest showed improved aeration of the left lung with residual left infrahilar masslike density possibly reflecting the primary bronchogenic carcinoma, grossly stable left axillary confluent mediastinal and hilar adenopathy consistent with metastatic disease, left adrenal nodule suspicious for metastasis, new small dependent right pleural effusion stable pericardial effusion. CT abdomen pelvis showed no evidence of primary intra abdominal or intrapelvic malignancy. Partial visualization of heterogeneously enhancing left lower lung opacities as well as Left pleural effusion. 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 There is a 2.4 cm LEFT adrenal nodule. Findings are concerning for metastatic disease.  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 MR brain negative for malignancy.  Foundation one EGFR exon 19 deletion. Started tagrisso Dec 2nd week 2021. Imaging from March 2022 consistent with response.   Adenocarcinoma of left lung, stage 4 ([place])  01/13/2020 Initial Diagnosis   Adenocarcinoma of left lung, stage 4 ([place])   01/13/2020 Cancer Staging   Staging form: Lung, AJCC 8th Edition - Clinical stage from 01/13/2020: Stage IVB (cTX, cN3, cM1c) - Signed by [name], MD on 01/13/2020   01/30/2020 -  Chemotherapy   The patient had [No matching medication found in this treatment plan]  for chemotherapy treatment.      [place]  She never smoked or chewed any tobacco products No occupational exposure. Worked in [employer] (medical) FH, mom died of liver cancer in 39's, sister died of breast cancer in 28's   Interim history  She is here by herself for follow-up on her osimertinib. She is doing well, no major complaints except HTN No nausea, vomiting, diarrhea Breathing is comfortable, may get some SOB on exertion No change in urinary habits No headaches, weakness. Rest of the ROS reviewed and negative.  MEDICAL HISTORY:  Past Medical History:  Diagnosis Date  . Arthritis   . Diabetes mellitus without complication ([place])   . Hypertension   . Non-small cell lung cancer ([place])    Dx 12/30/19 by thoracentesis     SURGICAL HISTORY: Past Surgical History:  Procedure Laterality Date  . ABDOMINAL HYSTERECTOMY    . ECTOPIC PREGNANCY SURGERY      SOCIAL HISTORY: Social History   Socioeconomic History  . Marital status: Widowed    Spouse name: Not on file  . Number of children: Not on file  . Years of education: Not on file  . Highest education level: Not on  file  Occupational History  . Not on file  Tobacco Use  . Smoking status: Never Smoker  . Smokeless tobacco: Never Used  Substance and Sexual Activity  . Alcohol use: Not Currently  . Drug use: Never  . Sexual activity: Not on file  Other Topics Concern  . Not on file  Social History Narrative  . Not on file   Social Determinants of Health   Financial Resource Strain: Low Risk   . Difficulty of Paying Living Expenses: Not hard at all  Food Insecurity: No Food Insecurity  . Worried About Charity fundraiser in the Last Year: Never true  . Ran Out of Food in the Last Year: Never true  Transportation Needs: No Transportation Needs  . Lack of Transportation (Medical): No  . Lack of Transportation (Non-Medical): No  Physical Activity: Inactive  . Days of Exercise per Week: 0 days  . Minutes of Exercise per Session: 0 min  Stress: No Stress Concern Present  . Feeling of Stress : Not at all  Social Connections: Socially Isolated  . Frequency of Communication with Friends and Family: Once a week  . Frequency of Social Gatherings with Friends and Family: Once a week  . Attends Religious Services: Never  . Active Member of Clubs or Organizations: No  . Attends Archivist Meetings: 1 to 4 times per year  . Marital Status: Widowed  Intimate Partner Violence: Not At Risk  . Fear of Current  or Ex-Partner: No  . Emotionally Abused: No  . Physically Abused: No  . Sexually Abused: No    FAMILY HISTORY: No family history on file.  ALLERGIES:  has No Known Allergies.  MEDICATIONS:  Current Outpatient Medications  Medication Sig Dispense Refill  . albuterol (VENTOLIN HFA) 108 (90 Base) MCG/ACT inhaler Inhale 2 puffs into the lungs every 6 (six) hours as needed for wheezing or shortness of breath. 1 each 1  . amLODipine (NORVASC) 5 MG tablet TAKE 1 TABLET (5 MG TOTAL) BY MOUTH DAILY. 30 tablet 1  . colchicine 0.6 MG tablet Take 1 tablet (0.6 mg total) by mouth 2 (two) times  daily. 90 tablet 1  . furosemide (LASIX) 20 MG tablet Take 1 tablet (20 mg total) by mouth daily. 30 tablet 3  . guaifenesin (ROBITUSSIN) 100 MG/5ML syrup Take 200 mg by mouth 3 (three) times daily as needed for cough.    . hydrocortisone cream 1 % Apply 1 application topically daily as needed for itching (rash).    . metoprolol tartrate (LOPRESSOR) 25 MG tablet Take 1 tablet (25 mg total) by mouth 2 (two) times daily. 60 tablet 1  . osimertinib mesylate (TAGRISSO) 80 MG tablet Take 1 tablet (80 mg total) by mouth daily. Days 1-28. 30 tablet 11  . potassium chloride (KLOR-CON) 10 MEQ tablet TAKE 2 TABLETS BY MOUTH DAILY 60 tablet 2  . tetrahydrozoline 0.05 % ophthalmic solution Place 1 drop into both eyes daily as needed (dry, itchy eyes). (Patient not taking: Reported on 05/24/2020)     No current facility-administered medications for this visit.     PHYSICAL EXAMINATION: ECOG PERFORMANCE STATUS: 2 - Symptomatic, <50% confined to bed  Vitals:   06/21/20 1303  BP: (!) 176/60  Pulse: 86  Resp: 17  Temp: 97.9 F (36.6 C)  SpO2: 97%   Filed Weights   06/21/20 1303  Weight: 142 lb 14.4 oz (64.8 kg)   Physical Exam Constitutional:      General: She is not in acute distress.    Appearance: Normal appearance. She is not toxic-appearing.  HENT:     Head: Normocephalic and atraumatic.     Mouth/Throat:     Mouth: Mucous membranes are moist.  Cardiovascular:     Rate and Rhythm: Normal rate and regular rhythm.     Pulses: Normal pulses.     Heart sounds: Normal heart sounds.  Pulmonary:     Effort: Pulmonary effort is normal.     Breath sounds: Normal breath sounds.     Comments: CTA Bilaterally Abdominal:     General: Abdomen is flat.     Palpations: Abdomen is soft.  Musculoskeletal:        General: No swelling (BLE swelling improved). Normal range of motion.     Right lower leg: Edema (Trace, could be related to medication) present.     Left lower leg: Edema (Trace) present.   Lymphadenopathy:     Cervical: Cervical adenopathy (palpable right cervical lymphadenopathy and left supraclavicular lymphadenopathy, significantly improved) present.     Comments: Barely palpable lower cervical lymphadenopathy  Skin:    General: Skin is warm and dry.  Neurological:     General: No focal deficit present.     Mental Status: She is alert and oriented to person, place, and time.  Psychiatric:        Mood and Affect: Mood normal.        Behavior: Behavior normal.      LABORATORY  DATA:  I have reviewed the data as listed Lab Results  Component Value Date   WBC 5.3 05/24/2020   HGB 12.3 05/24/2020   HCT 38.7 05/24/2020   MCV 88.8 05/24/2020   PLT 235 05/24/2020     Chemistry      Component Value Date/Time   NA 138 05/24/2020 1517   K 4.1 05/24/2020 1517   CL 104 05/24/2020 1517   CO2 23 05/24/2020 1517   BUN 15 05/24/2020 1517   CREATININE 0.95 05/24/2020 1517   CREATININE 0.79 04/26/2020 1513      Component Value Date/Time   CALCIUM 9.0 05/24/2020 1517   ALKPHOS 80 05/24/2020 1517   AST 17 05/24/2020 1517   AST 24 04/26/2020 1513   ALT 16 05/24/2020 1517   ALT 23 04/26/2020 1513   BILITOT 0.5 05/24/2020 1517   BILITOT 0.4 04/26/2020 1513     RADIOGRAPHIC STUDIES: I have personally reviewed the radiological images as listed and agreed with the findings in the report. No results found.  Labs from today are pending    [name], MD 06/21/2020 1:45 PM

## 2020-06-30 ENCOUNTER — Other Ambulatory Visit: Payer: Self-pay | Admitting: Hematology and Oncology

## 2020-06-30 DIAGNOSIS — I10 Essential (primary) hypertension: Secondary | ICD-10-CM

## 2020-07-19 ENCOUNTER — Inpatient Hospital Stay: Payer: Self-pay | Attending: Physician Assistant

## 2020-07-19 ENCOUNTER — Other Ambulatory Visit: Payer: Self-pay | Admitting: Hematology and Oncology

## 2020-07-25 ENCOUNTER — Other Ambulatory Visit: Payer: Self-pay | Admitting: Hematology and Oncology

## 2020-07-26 ENCOUNTER — Other Ambulatory Visit: Payer: Self-pay | Admitting: Hematology and Oncology

## 2020-07-26 DIAGNOSIS — I10 Essential (primary) hypertension: Secondary | ICD-10-CM

## 2020-07-30 ENCOUNTER — Other Ambulatory Visit: Payer: Self-pay

## 2020-07-30 DIAGNOSIS — C3492 Malignant neoplasm of unspecified part of left bronchus or lung: Secondary | ICD-10-CM

## 2020-08-05 ENCOUNTER — Encounter (HOSPITAL_COMMUNITY): Payer: Self-pay | Admitting: Emergency Medicine

## 2020-08-05 ENCOUNTER — Emergency Department (HOSPITAL_COMMUNITY): Payer: Medicare Other

## 2020-08-05 ENCOUNTER — Inpatient Hospital Stay (HOSPITAL_COMMUNITY)
Admission: EM | Admit: 2020-08-05 | Discharge: 2020-08-17 | DRG: 314 | Disposition: A | Discharge status: Skilled Nursing Facility | Payer: Medicare Other | Attending: Internal Medicine | Admitting: Internal Medicine

## 2020-08-05 ENCOUNTER — Other Ambulatory Visit: Payer: Self-pay

## 2020-08-05 ENCOUNTER — Telehealth: Payer: Self-pay | Admitting: Student in an Organized Health Care Education/Training Program

## 2020-08-05 DIAGNOSIS — Z803 Family history of malignant neoplasm of breast: Secondary | ICD-10-CM

## 2020-08-05 DIAGNOSIS — Z20822 Contact with and (suspected) exposure to covid-19: Secondary | ICD-10-CM | POA: Diagnosis present

## 2020-08-05 DIAGNOSIS — I10 Essential (primary) hypertension: Secondary | ICD-10-CM

## 2020-08-05 DIAGNOSIS — C7970 Secondary malignant neoplasm of unspecified adrenal gland: Secondary | ICD-10-CM | POA: Diagnosis present

## 2020-08-05 DIAGNOSIS — Z6823 Body mass index (BMI) 23.0-23.9, adult: Secondary | ICD-10-CM

## 2020-08-05 DIAGNOSIS — I313 Pericardial effusion (noninflammatory): Principal | ICD-10-CM | POA: Diagnosis present

## 2020-08-05 DIAGNOSIS — M109 Gout, unspecified: Secondary | ICD-10-CM | POA: Diagnosis present

## 2020-08-05 DIAGNOSIS — E119 Type 2 diabetes mellitus without complications: Secondary | ICD-10-CM | POA: Diagnosis present

## 2020-08-05 DIAGNOSIS — I319 Disease of pericardium, unspecified: Secondary | ICD-10-CM

## 2020-08-05 DIAGNOSIS — R54 Age-related physical debility: Secondary | ICD-10-CM | POA: Diagnosis present

## 2020-08-05 DIAGNOSIS — Z01818 Encounter for other preprocedural examination: Secondary | ICD-10-CM

## 2020-08-05 DIAGNOSIS — Z79899 Other long term (current) drug therapy: Secondary | ICD-10-CM | POA: Diagnosis not present

## 2020-08-05 DIAGNOSIS — J9621 Acute and chronic respiratory failure with hypoxia: Secondary | ICD-10-CM | POA: Diagnosis present

## 2020-08-05 DIAGNOSIS — J811 Chronic pulmonary edema: Secondary | ICD-10-CM | POA: Diagnosis present

## 2020-08-05 DIAGNOSIS — T502X5A Adverse effect of carbonic-anhydrase inhibitors, benzothiadiazides and other diuretics, initial encounter: Secondary | ICD-10-CM | POA: Diagnosis not present

## 2020-08-05 DIAGNOSIS — E441 Mild protein-calorie malnutrition: Secondary | ICD-10-CM | POA: Diagnosis present

## 2020-08-05 DIAGNOSIS — Y838 Other surgical procedures as the cause of abnormal reaction of the patient, or of later complication, without mention of misadventure at the time of the procedure: Secondary | ICD-10-CM | POA: Diagnosis not present

## 2020-08-05 DIAGNOSIS — I110 Hypertensive heart disease with heart failure: Secondary | ICD-10-CM | POA: Diagnosis present

## 2020-08-05 DIAGNOSIS — J9620 Acute and chronic respiratory failure, unspecified whether with hypoxia or hypercapnia: Secondary | ICD-10-CM

## 2020-08-05 DIAGNOSIS — I97710 Intraoperative cardiac arrest during cardiac surgery: Secondary | ICD-10-CM | POA: Diagnosis not present

## 2020-08-05 DIAGNOSIS — Z5329 Procedure and treatment not carried out because of patient's decision for other reasons: Secondary | ICD-10-CM | POA: Diagnosis not present

## 2020-08-05 DIAGNOSIS — E871 Hypo-osmolality and hyponatremia: Secondary | ICD-10-CM | POA: Diagnosis not present

## 2020-08-05 DIAGNOSIS — C7972 Secondary malignant neoplasm of left adrenal gland: Secondary | ICD-10-CM

## 2020-08-05 DIAGNOSIS — I952 Hypotension due to drugs: Secondary | ICD-10-CM | POA: Diagnosis not present

## 2020-08-05 DIAGNOSIS — Z9889 Other specified postprocedural states: Secondary | ICD-10-CM

## 2020-08-05 DIAGNOSIS — Z751 Person awaiting admission to adequate facility elsewhere: Secondary | ICD-10-CM

## 2020-08-05 DIAGNOSIS — R64 Cachexia: Secondary | ICD-10-CM | POA: Diagnosis present

## 2020-08-05 DIAGNOSIS — J910 Malignant pleural effusion: Secondary | ICD-10-CM | POA: Diagnosis present

## 2020-08-05 DIAGNOSIS — R0902 Hypoxemia: Secondary | ICD-10-CM

## 2020-08-05 DIAGNOSIS — C7951 Secondary malignant neoplasm of bone: Secondary | ICD-10-CM | POA: Diagnosis present

## 2020-08-05 DIAGNOSIS — R34 Anuria and oliguria: Secondary | ICD-10-CM | POA: Diagnosis not present

## 2020-08-05 DIAGNOSIS — C3492 Malignant neoplasm of unspecified part of left bronchus or lung: Secondary | ICD-10-CM | POA: Diagnosis present

## 2020-08-05 DIAGNOSIS — Z90710 Acquired absence of both cervix and uterus: Secondary | ICD-10-CM | POA: Diagnosis not present

## 2020-08-05 DIAGNOSIS — N179 Acute kidney failure, unspecified: Secondary | ICD-10-CM | POA: Diagnosis not present

## 2020-08-05 DIAGNOSIS — I5032 Chronic diastolic (congestive) heart failure: Secondary | ICD-10-CM | POA: Diagnosis present

## 2020-08-05 DIAGNOSIS — Z9911 Dependence on respirator [ventilator] status: Secondary | ICD-10-CM

## 2020-08-05 DIAGNOSIS — J9811 Atelectasis: Secondary | ICD-10-CM | POA: Diagnosis present

## 2020-08-05 DIAGNOSIS — T4275XA Adverse effect of unspecified antiepileptic and sedative-hypnotic drugs, initial encounter: Secondary | ICD-10-CM | POA: Diagnosis not present

## 2020-08-05 DIAGNOSIS — Z0189 Encounter for other specified special examinations: Secondary | ICD-10-CM

## 2020-08-05 DIAGNOSIS — Y92239 Unspecified place in hospital as the place of occurrence of the external cause: Secondary | ICD-10-CM | POA: Diagnosis not present

## 2020-08-05 DIAGNOSIS — R234 Changes in skin texture: Secondary | ICD-10-CM | POA: Diagnosis present

## 2020-08-05 DIAGNOSIS — Z978 Presence of other specified devices: Secondary | ICD-10-CM

## 2020-08-05 DIAGNOSIS — I3139 Other pericardial effusion (noninflammatory): Secondary | ICD-10-CM

## 2020-08-05 DIAGNOSIS — M1990 Unspecified osteoarthritis, unspecified site: Secondary | ICD-10-CM | POA: Diagnosis present

## 2020-08-05 LAB — BASIC METABOLIC PANEL
Anion gap: 8 (ref 5–15)
BUN: 14 mg/dL (ref 8–23)
CO2: 27 mmol/L (ref 22–32)
Calcium: 9.3 mg/dL (ref 8.9–10.3)
Chloride: 103 mmol/L (ref 98–111)
Creatinine, Ser: 0.68 mg/dL (ref 0.44–1.00)
GFR, Estimated: >60 mL/min (ref >60)
Glucose, Bld: 98 mg/dL (ref 70–99)
Potassium: 3.9 mmol/L (ref 3.5–5.1)
Sodium: 138 mmol/L (ref 135–145)

## 2020-08-05 LAB — CBC WITH DIFFERENTIAL/PLATELET
Abs Immature Granulocytes: 0.01 10*3/uL (ref 0.00–0.07)
Basophils Absolute: 0 10*3/uL (ref 0.0–0.1)
Basophils Relative: 0 %
Eosinophils Absolute: 0 10*3/uL (ref 0.0–0.5)
Eosinophils Relative: 0 %
HCT: 40.7 % (ref 36.0–46.0)
Hemoglobin: 13.3 g/dL (ref 12.0–15.0)
Immature Granulocytes: 0 %
Lymphocytes Relative: 16 %
Lymphs Abs: 0.8 10*3/uL (ref 0.7–4.0)
MCH: 29.8 pg (ref 26.0–34.0)
MCHC: 32.7 g/dL (ref 30.0–36.0)
MCV: 91.1 fL (ref 80.0–100.0)
Monocytes Absolute: 0.5 10*3/uL (ref 0.1–1.0)
Monocytes Relative: 10 %
Neutro Abs: 3.3 10*3/uL (ref 1.7–7.7)
Neutrophils Relative %: 74 %
Platelets: 231 10*3/uL (ref 150–400)
RBC: 4.47 MIL/uL (ref 3.87–5.11)
RDW: 13.3 % (ref 11.5–15.5)
WBC: 4.6 10*3/uL (ref 4.0–10.5)
nRBC: 0 % (ref 0.0–0.2)

## 2020-08-05 LAB — BRAIN NATRIURETIC PEPTIDE: B Natriuretic Peptide: 26.9 pg/mL (ref 0.0–100.0)

## 2020-08-05 LAB — LACTIC ACID, PLASMA: Lactic Acid, Venous: 1.3 mmol/L (ref 0.5–1.9)

## 2020-08-05 LAB — RESP PANEL BY RT-PCR (FLU A&B, COVID) ARPGX2
Influenza A by PCR: NEGATIVE
Influenza B by PCR: NEGATIVE
SARS Coronavirus 2 by RT PCR: NEGATIVE

## 2020-08-05 LAB — TROPONIN I (HIGH SENSITIVITY): Troponin I (High Sensitivity): 8 ng/L (ref <18)

## 2020-08-05 MED ORDER — ALBUTEROL SULFATE HFA 108 (90 BASE) MCG/ACT IN AERS
2.0000 | INHALATION_SPRAY | Freq: Four times a day (QID) | RESPIRATORY_TRACT | Status: DC | PRN
  Filled 2020-08-05: qty 6.7

## 2020-08-05 MED ORDER — SODIUM CHLORIDE 0.9 % IV SOLN: INTRAVENOUS | Status: DC

## 2020-08-05 MED ORDER — METOPROLOL TARTRATE 5 MG/5ML IV SOLN: 5.0000 mg | Freq: Four times a day (QID) | INTRAVENOUS | Status: DC | PRN

## 2020-08-05 MED ORDER — ACETAMINOPHEN 325 MG PO TABS: 650.0000 mg | ORAL_TABLET | Freq: Four times a day (QID) | ORAL | Status: DC | PRN

## 2020-08-05 MED ORDER — POTASSIUM CHLORIDE CRYS ER 20 MEQ PO TBCR
20.0000 meq | EXTENDED_RELEASE_TABLET | Freq: Every day | ORAL | Status: DC
  Administered 2020-08-06 – 2020-08-07 (×2): 20 meq via ORAL
  Filled 2020-08-05 (×2): qty 2
  Filled 2020-08-05: qty 1
  Filled 2020-08-05: qty 2

## 2020-08-05 MED ORDER — OSIMERTINIB MESYLATE 80 MG PO TABS
80.0000 mg | ORAL_TABLET | Freq: Every day | ORAL | Status: DC
Start: 2020-08-06 — End: 2020-08-07

## 2020-08-05 MED ORDER — FUROSEMIDE 40 MG PO TABS: 20.0000 mg | ORAL_TABLET | Freq: Every day | ORAL | Status: DC

## 2020-08-05 MED ORDER — HYDROCORTISONE 1 % EX CREA: 1.0000 "application " | TOPICAL_CREAM | Freq: Every day | CUTANEOUS | Status: DC | PRN

## 2020-08-05 MED ORDER — AMLODIPINE BESYLATE 5 MG PO TABS: 5.0000 mg | ORAL_TABLET | Freq: Every day | ORAL | Status: DC

## 2020-08-05 MED ORDER — MORPHINE SULFATE (PF) 2 MG/ML IV SOLN: 2.0000 mg | INTRAVENOUS | Status: DC | PRN

## 2020-08-05 MED ORDER — ONDANSETRON HCL 4 MG/2ML IJ SOLN: 4.0000 mg | Freq: Four times a day (QID) | INTRAMUSCULAR | Status: DC | PRN

## 2020-08-05 MED ORDER — ACETAMINOPHEN 650 MG RE SUPP: 650.0000 mg | Freq: Four times a day (QID) | RECTAL | Status: DC | PRN

## 2020-08-05 MED ORDER — COLCHICINE 0.6 MG PO TABS
0.6000 mg | ORAL_TABLET | Freq: Two times a day (BID) | ORAL | Status: DC
  Administered 2020-08-05 – 2020-08-08 (×4): 0.6 mg via ORAL
  Filled 2020-08-05 (×5): qty 1

## 2020-08-05 MED ORDER — ONDANSETRON HCL 4 MG PO TABS: 4.0000 mg | ORAL_TABLET | Freq: Four times a day (QID) | ORAL | Status: DC | PRN

## 2020-08-05 MED ORDER — METOPROLOL TARTRATE 25 MG PO TABS
25.0000 mg | ORAL_TABLET | Freq: Two times a day (BID) | ORAL | Status: DC
  Administered 2020-08-05 – 2020-08-07 (×4): 25 mg via ORAL
  Filled 2020-08-05 (×5): qty 1

## 2020-08-05 MED ORDER — GUAIFENESIN 100 MG/5ML PO SOLN: 200.0000 mg | Freq: Three times a day (TID) | ORAL | Status: DC | PRN

## 2020-08-05 MED ORDER — HYDRALAZINE HCL 25 MG PO TABS
25.0000 mg | ORAL_TABLET | Freq: Two times a day (BID) | ORAL | Status: DC
  Administered 2020-08-05: 25 mg via ORAL
  Filled 2020-08-05: qty 1

## 2020-08-05 NOTE — H&P (Addendum)
History and Physical   Sara Ferguson BWG:[id number] DOB: 01/12/1946 DOA: 08/05/2020  Referring MD/NP/PA: Dr. Almyra Free  PCP: [name], MD   Outpatient Specialists: Dr. [name], oncology:  Patient coming from: Home  Chief Complaint: Shortness of breath  HPI: Sara Ferguson is a 75 y.o. female with medical history significant of non-small cell lung cancer stage IV, recurrent malignant pleural effusion, diabetes, essential hypertension, osteoarthritis, who presented to the ER with 1 week of progressive shortness of breath, lower extremity edema as well as chest discomfort.  Patient was found to be tachycardic.  Also having dyspnea at rest and with exertion.  She denied any fever or chills.  Denied any cough.  Patient was found to be hypoxic with oxygen sats 89% on room air.  Currently on 2 L oxygen sats going between 91 to 92%.  She is unable to lay down flat.  She had a CT chest showing a large pericardial effusion with 2.1 cm thickness.  Patient also has left breast density possibly carcinoma.  Multiple lymphadenopathies.  She is currently on oral chemotherapy.  Patient noted to have had similar presentation in the past.  Has had a small pleural effusion documented in November last year.  Also in December of last year.  Now this has enlarged and worse.  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  Cardiology consulted and recommends admitting patient to [facility] with cardiology follow-up..  ED Course: Temperature 98 blood pressure 199/74, pulse 128, respirate of 29 and oxygen sat 89% room air.  CBC and chemistry entirely within normal lactic acid 1.3.  COVID-19 screen is negative.  CT chest without contrast shows left breast heterogeneous density with skin thickening concerning for carcinoma, left pericardial effusion measuring up to 2.1 cm in thickness as well as left greater than right heterogeneous interstitial and airspace  opacity possibly pulmonary edema.  Metastatic disease involving his left second right third and left seventh ribs.  Also a 69mm left adrenal mass concerning for metastatic disease.  Patient will therefore be admitted essentially for her pericardial effusion.  Review of Systems: As per HPI otherwise 10 point review of systems negative.    Past Medical History:  Diagnosis Date   Arthritis    Diabetes mellitus without complication ([place])    Hypertension    Non-small cell lung cancer ([place])    Dx 12/30/19 by thoracentesis     Past Surgical History:  Procedure Laterality Date   ABDOMINAL HYSTERECTOMY     ECTOPIC PREGNANCY SURGERY       reports that she has never smoked. She has never used smokeless tobacco. She reports previous alcohol use. She reports that she does not use drugs.  No Known Allergies  No family history on file.   Prior to Admission medications   Medication Sig Start Date End Date Taking? Authorizing Provider  albuterol (VENTOLIN HFA) 108 (90 Base) MCG/ACT inhaler Inhale 2 puffs into the lungs every 6 (six) hours as needed for wheezing or shortness of breath. 01/05/20 08/05/20 Yes Kc, Maren Beach, MD  amLODipine (NORVASC) 5 MG tablet TAKE 1 TABLET (5 MG TOTAL) BY MOUTH DAILY. 05/21/20 08/05/20 Yes [name], MD  colchicine 0.6 MG tablet Take 1 tablet (0.6 mg total) by mouth 2 (two) times daily. 01/28/20  Yes Oretha Milch D, MD  furosemide (LASIX) 20 MG tablet Take 1 tablet (20 mg total) by mouth daily. 03/22/20  Yes [name], MD  guaifenesin (ROBITUSSIN) 100  MG/5ML syrup Take 200 mg by mouth 3 (three) times daily as needed for cough.   Yes [provider]  hydrALAZINE (APRESOLINE) 25 MG tablet Take 25 mg by mouth 2 (two) times daily. 05/08/20  Yes [provider]  hydrocortisone cream 1 % Apply 1 application topically daily as needed for itching (rash).   Yes [provider]  metoprolol tartrate (LOPRESSOR) 50 MG tablet Take 25 mg by mouth 2  (two) times daily. 05/08/20  Yes [provider]  osimertinib mesylate (TAGRISSO) 80 MG tablet Take 1 tablet (80 mg total) by mouth daily. Days 1-28. 01/30/20  Yes [name], MD  potassium chloride (KLOR-CON) 10 MEQ tablet TAKE 2 TABLETS BY MOUTH DAILY Patient taking differently: Take 20 mEq by mouth daily. 05/25/20  Yes [name], MD  tetrahydrozoline 0.05 % ophthalmic solution Place 1 drop into both eyes daily as needed (dry, itchy eyes). Patient not taking: No sig reported    [provider]    Physical Exam: Vitals:   08/05/20 1930 08/05/20 2000 08/05/20 2045 08/05/20 2130  BP: (!) 148/60 (!) 154/65 (!) 167/70 (!) 144/56  Pulse: (!) 114 (!) 117 (!) 118 (!) 116  Resp: (!) 21 18 (!) 26 19  Temp:      TempSrc:      SpO2: 96% 95% 97% 95%  Height:          Constitutional: Chronically ill looking, cachectic, no distress Vitals:   08/05/20 1930 08/05/20 2000 08/05/20 2045 08/05/20 2130  BP: (!) 148/60 (!) 154/65 (!) 167/70 (!) 144/56  Pulse: (!) 114 (!) 117 (!) 118 (!) 116  Resp: (!) 21 18 (!) 26 19  Temp:      TempSrc:      SpO2: 96% 95% 97% 95%  Height:       Eyes: PERRL, lids and conjunctivae normal ENMT: Mucous membranes are dry. Posterior pharynx clear of any exudate or lesions.Normal dentition.  Neck: normal, supple, no masses, no thyromegaly Respiratory: c decreased air entry bilaterally with some basal crackles, no wheeze no accessory muscle use.  Cardiovascular: Sinus tachycardia with distant heart rate., no murmurs / rubs / gallops.  1+ extremity edema. 2+ pedal pulses. No carotid bruits.  Abdomen: no tenderness, no masses palpated. No hepatosplenomegaly. Bowel sounds positive.  Musculoskeletal: no clubbing / cyanosis. No joint deformity upper and lower extremities. Good ROM, no contractures. Normal muscle tone.  Skin: no rashes, lesions, ulcers. No induration Neurologic: CN 2-12 grossly intact. Sensation intact, DTR normal. Strength 5/5 in  all 4.  Psychiatric: Normal judgment and insight. Alert and oriented x 3.  Depressed mood.     Labs on Admission: I have personally reviewed following labs and imaging studies  CBC: Recent Labs  Lab 08/05/20 2000  WBC 4.6  NEUTROABS 3.3  HGB 13.3  HCT 40.7  MCV 91.1  PLT 947   Basic Metabolic Panel: Recent Labs  Lab 08/05/20 2000  NA 138  K 3.9  CL 103  CO2 27  GLUCOSE 98  BUN 14  CREATININE 0.68  CALCIUM 9.3   GFR: CrCl cannot be calculated (Unknown ideal weight.). Liver Function Tests: No results for input(s): AST, ALT, ALKPHOS, BILITOT, PROT, ALBUMIN in the last 168 hours. No results for input(s): LIPASE, AMYLASE in the last 168 hours. No results for input(s): AMMONIA in the last 168 hours. Coagulation Profile: No results for input(s): INR, PROTIME in the last 168 hours. Cardiac Enzymes: No results for input(s): CKTOTAL, CKMB, CKMBINDEX, TROPONINI in  the last 168 hours. BNP (last 3 results) No results for input(s): PROBNP in the last 8760 hours. HbA1C: No results for input(s): HGBA1C in the last 72 hours. CBG: No results for input(s): GLUCAP in the last 168 hours. Lipid Profile: No results for input(s): CHOL, HDL, LDLCALC, TRIG, CHOLHDL, LDLDIRECT in the last 72 hours. Thyroid Function Tests: No results for input(s): TSH, T4TOTAL, FREET4, T3FREE, THYROIDAB in the last 72 hours. Anemia Panel: No results for input(s): VITAMINB12, FOLATE, FERRITIN, TIBC, IRON, RETICCTPCT in the last 72 hours. Urine analysis: No results found for: COLORURINE, APPEARANCEUR, LABSPEC, PHURINE, GLUCOSEU, HGBUR, BILIRUBINUR, KETONESUR, PROTEINUR, UROBILINOGEN, NITRITE, LEUKOCYTESUR Sepsis Labs: @LABRCNTIP (procalcitonin:4,lacticidven:4) ) Recent Results (from the past 240 hour(s))  Resp Panel by RT-PCR (Flu A&B, Covid) Nasopharyngeal Swab     Status: None   Collection Time: 08/05/20  6:30 PM   Specimen: Nasopharyngeal Swab; Nasopharyngeal(NP) swabs in vial transport medium   Result Value Ref Range Status   SARS Coronavirus 2 by RT PCR NEGATIVE NEGATIVE Final    Comment: (NOTE) SARS-CoV-2 target nucleic acids are NOT DETECTED.  The SARS-CoV-2 RNA is generally detectable in upper respiratory specimens during the acute phase of infection. The lowest concentration of SARS-CoV-2 viral copies this assay can detect is 138 copies/mL. A negative result does not preclude SARS-Cov-2 infection and should not be used as the sole basis for treatment or other patient management decisions. A negative result may occur with  improper specimen collection/handling, submission of specimen other than nasopharyngeal swab, presence of viral mutation(s) within the areas targeted by this assay, and inadequate number of viral copies(<138 copies/mL). A negative result must be combined with clinical observations, patient history, and epidemiological information. The expected result is Negative.  Fact Sheet for Patients:  EntrepreneurPulse.com.au  Fact Sheet for Healthcare Providers:  IncredibleEmployment.be  This test is no t yet approved or cleared by the [place] FDA and  has been authorized for detection and/or diagnosis of SARS-CoV-2 by FDA under an Emergency Use Authorization (EUA). This EUA will remain  in effect (meaning this test can be used) for the duration of the COVID-19 declaration under Section 564(b)(1) of the Act, 21 U.S.C.section 360bbb-3(b)(1), unless the authorization is terminated  or revoked sooner.       Influenza A by PCR NEGATIVE NEGATIVE Final   Influenza B by PCR NEGATIVE NEGATIVE Final    Comment: (NOTE) The Xpert Xpress SARS-CoV-2/FLU/RSV plus assay is intended as an aid in the diagnosis of influenza from Nasopharyngeal swab specimens and should not be used as a sole basis for treatment. Nasal washings and aspirates are unacceptable for Xpert Xpress SARS-CoV-2/FLU/RSV testing.  Fact Sheet for  Patients: EntrepreneurPulse.com.au  Fact Sheet for Healthcare Providers: IncredibleEmployment.be  This test is not yet approved or cleared by the [place] FDA and has been authorized for detection and/or diagnosis of SARS-CoV-2 by FDA under an Emergency Use Authorization (EUA). This EUA will remain in effect (meaning this test can be used) for the duration of the COVID-19 declaration under Section 564(b)(1) of the Act, 21 U.S.C. section 360bbb-3(b)(1), unless the authorization is terminated or revoked.  Performed at [facility], [place] [address] [place], [place] [address]      Radiological Exams on Admission: CT Chest Wo Contrast  Result Date: 08/05/2020 CLINICAL DATA:  Shortness of breath EXAM: CT CHEST WITHOUT CONTRAST TECHNIQUE: Multidetector CT imaging of the chest was performed following the standard protocol without IV contrast. COMPARISON:  04/19/2020 FINDINGS: Cardiovascular: Large pericardial effusion measuring approximately  2.1 cm in thickness. Calcific aortic atherosclerosis. Mediastinum/Nodes: Increased density within the upper mediastinum, likely lymphadenopathy. There is left greater than right axillary adenopathy. Asymmetric density within the left breast. There is also left breast skin thickening. Lungs/Pleura: Left-greater-than-right heterogeneous interstitial and airspace opacity. Peribronchial thickening. Left lower lobe bronchi are occluded. There is complete complex of the left lower lobe. Upper Abdomen: 16 mm left adrenal mass. Musculoskeletal: Sclerotic lesions of the left second,, right third and left seventh ribs. Numerous sclerotic lesions within the vertebral bodies, greatest at T3. IMPRESSION: 1. Left breast heterogeneous density with skin thickening is concerning for carcinoma the skin thickening could be due to edema. Correlation with mammography is recommended. 2. Large pericardial effusion, measuring up  to 2.1 cm in thickness, progressed. 3. Left-greater-than-right heterogeneous interstitial and airspace opacity, concerning for pulmonary edema or infection. Left lower lobe bronchi are occluded. Reportedly the patient has a history of non-small cell lung carcinoma, but no distinct mass is visualized on this noncontrast study. Aeration of both lungs is much worse than on 04/19/2020. 4. Left greater than right axillary and mediastinal lymphadenopathy, likely metastatic. 5. Sclerotic lesions of the left second, right third and left seventh ribs, consistent with metastatic disease. 6. 16 mm left adrenal mass, concerning for metastatic disease. Aortic Atherosclerosis (ICD10-I70.0). Electronically Signed   By: Ulyses Jarred M.D.   On: 08/05/2020 20:53   DG Chest Port 1 View  Result Date: 08/05/2020 CLINICAL DATA:  Cough and shortness of breath. EXAM: PORTABLE CHEST 1 VIEW COMPARISON:  February 23, 2020 FINDINGS: Markedly enlarged and globular cardiac silhouette. Calcific atherosclerotic disease of the aorta. Probable left pleural effusion. Left lower lobe airspace disease versus atelectasis. Bilateral interstitial opacities with central predominance. Osseous structures are without acute abnormality. Soft tissues are grossly normal. IMPRESSION: 1. Markedly enlarged and globular cardiac silhouette. Cardiomyopathy versus enlarging pericardial effusion. 2. Probable left pleural effusion with left lower lobe airspace disease versus atelectasis. 3. Bilateral interstitial opacities with central predominance may represent interstitial pulmonary edema. Electronically Signed   By: Fidela Salisbury M.D.   On: 08/05/2020 17:46    EKG: Independently reviewed.  Shows sinus tachycardia with a rate of 120, no specific ST changes.  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  Patient will be admitted to [facility] where as cardiology would be available.  She will get possible intervention.  We will keep on telemetry. Close monitoring.  Patient on colchicine will continue  #2 essential hypertension: Patient takes amlodipine, metoprolol as well as hydralazine and Lasix.  We will continue  #3 diabetes: Sliding scale insulin.  #4 adenocarcinoma of the left lung stage IV: Patient on oral Tagrisso.  We will continue in the hospital.  Outpatient follow-up with oncology   DVT prophylaxis: SCD Code Status: Full code Family Communication: No family at bedside Disposition Plan: To be determined Consults called: Dr. Renella Cunas, cardiology Admission status: Inpatient  Severity of Illness: The appropriate patient status for this patient is INPATIENT. Inpatient status is judged to be reasonable and necessary in order to provide the required intensity of service to ensure the patient's safety. The patient's presenting symptoms, physical exam findings, and initial radiographic and laboratory data in the context of their  chronic comorbidities is felt to place them at high risk for further clinical deterioration. Furthermore, it is not anticipated that the patient will be medically stable for discharge from the hospital within 2 midnights of admission. The following factors support the patient status of inpatient.   " The patient's presenting symptoms include shortness of breath. " The worrisome physical exam findings include lower extremity edema and sinus tachycardia. " The initial radiographic and laboratory data are worrisome because of left pericardial effusion. " The chronic co-morbidities include stage IV lung cancer.   * I certify that at the point of admission it is my clinical judgment that the patient will  require inpatient hospital care spanning beyond 2 midnights from the point of admission due to high intensity of service, high risk for further deterioration and high frequency of surveillance required.Barbette Merino MD [facility] Pager [phone]  If 7PM-7AM, please contact night-coverage [email or web address] Password [facility]  08/05/2020, 10:08 PM

## 2020-08-05 NOTE — ED Triage Notes (Signed)
Complains of SOB x1 week, states it has just been getting worse, endorses bilateral feet swelling and SOB w/ activity. Denies fevers, N/V/D. Endorses a cough w/ fleams. 89% RA, 1 L Impact applied.

## 2020-08-05 NOTE — Telephone Encounter (Signed)
Called by [facility] provider regarding Sara Ferguson who presents with SOB 1 week duration which has been progressively worsening. Also with b/l LE edema and DOE. Denied any chest pain/pressure or infectious sx. 89% on RA on arrival, able to lay comfortably to 30 degrees. Currently on 1L [place]. CT chest wo with large pericardial effusion 2.1 cm thickness. Also with L breast heterogenous density c/f carcinoma along with pulmonary edema or infxn. Hx of bronchogenic carcinoma, had L>R mediastinal LAD along with sclerotic lesions throughout ribs and adrenal mass all c/f metastatic disease.   TTE in 01/26/20 with preserved EF but moderate pericardial effusion (14 mm) and only with partial criteria met for tamponade physiology on echo. Small pericardial effusion on 01/01/20.   Similar presentation on 02/15/20 with b/l swelling in LE while actively receiving tx for cancer.   Reviewing hematology notes it looks like she was started on tagrisso for palliative tx of stage IV cancer. Last seen on 06/21/20 and was still on first line tagrisso and overall doing well.   She did have CT C/A/P on 04/20/19 which commented on small pericardial effusion presumably malignant, looks larger today.   ED provider to reach out to [facility] for transfer/admission. Cardiology can consult if the driver of her current sx is thought to be 2/2 enlarging pericardial effusion. Will need echo to assess for features of tamponade physiology. Also [facility] conversation if we think the pericardial effusion is leading to worsening of sx and whether she would get sx benefit from pericardiocentesis. Please consult cardiology when she arrives.

## 2020-08-05 NOTE — ED Provider Notes (Signed)
[facility] DEPT Provider Note   CSN: [id number] Arrival date & time: 08/05/20  1649     History No chief complaint on file.   Mayara Paulson is a 75 y.o. female.  Patient presents ER chief complaint of cough and shortness of breath ongoing for about a week.  She states symptoms are worsening over the course the last few days.  Denies nausea vomiting diarrhea.  Denies fevers.  States that her legs and feet have been more swollen than normal for the past week.  Denies chest pain or headache or abdominal pain.      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   Hypokalemia 01/25/2020   Volume overload 01/25/2020   Adenocarcinoma of left lung, stage 4 ([place]) 01/13/2020   Goals of care, counseling/discussion 01/13/2020   Pleural effusion, left 12/31/2019   Type 2 diabetes mellitus without complication ([place]) 32/35/5732   Acute respiratory failure with hypoxia ([place]) 12/31/2019   Pericardial effusion 12/31/2019   Essential hypertension 12/30/2019   Pleural effusion, malignant 12/30/2019   Pleural effusion 12/30/2019    Past Surgical History:  Procedure Laterality Date   ABDOMINAL HYSTERECTOMY     ARTERIAL LINE INSERTION N/A 08/07/2020   Procedure: ARTERIAL LINE INSERTION;  Surgeon: Dixie Dials, MD;  Location: [facility];  Service: Cardiovascular;  Laterality: N/A;   ECTOPIC PREGNANCY SURGERY     PERICARDIOCENTESIS N/A 08/07/2020   Procedure: PERICARDIOCENTESIS;  Surgeon: Dixie Dials, MD;  Location: [facility];  Service: Cardiovascular;  Laterality: N/A;     OB History   No obstetric history on file.     History reviewed. No pertinent  family history.  Social History   Tobacco Use   Smoking status: Never   Smokeless tobacco: Never  Substance Use Topics   Alcohol use: Not Currently   Drug use: Never    Home Medications Prior to Admission medications   Medication Sig Start Date End Date Taking? Authorizing Provider  albuterol (VENTOLIN HFA) 108 (90 Base) MCG/ACT inhaler Inhale 2 puffs into the lungs every 6 (six) hours as needed for wheezing or shortness of breath. 01/05/20 08/05/20 Yes Kc, Maren Beach, MD  amLODipine (NORVASC) 5 MG tablet TAKE 1 TABLET (5 MG TOTAL) BY MOUTH DAILY. 05/21/20 08/05/20 Yes [name], MD  colchicine 0.6 MG tablet Take 1 tablet (0.6 mg total) by mouth 2 (two) times daily. 01/28/20  Yes Oretha Milch D, MD  furosemide (LASIX) 20 MG tablet Take 1 tablet (20 mg total) by mouth daily. 03/22/20  Yes [name], MD  guaifenesin (ROBITUSSIN) 100 MG/5ML syrup Take 200 mg by mouth 3 (three) times daily as needed for cough.   Yes [provider]  hydrALAZINE (APRESOLINE) 25 MG tablet Take 25 mg by mouth 2 (two) times daily. 05/08/20  Yes [provider]  hydrocortisone cream 1 % Apply 1 application topically daily as needed for itching (rash).   Yes [provider]  metoprolol tartrate (LOPRESSOR) 50 MG tablet Take 25 mg by mouth 2 (two) times daily. 05/08/20  Yes [provider]  osimertinib mesylate (TAGRISSO) 80 MG tablet Take 1 tablet (80 mg total) by mouth daily.  Days 1-28. 01/30/20  Yes [name], MD  potassium chloride (KLOR-CON) 10 MEQ tablet TAKE 2 TABLETS BY MOUTH DAILY Patient taking differently: Take 20 mEq by mouth daily. 05/25/20  Yes [name], MD  tetrahydrozoline 0.05 % ophthalmic solution Place 1 drop into both eyes daily as needed (dry, itchy eyes). Patient not taking: No sig reported    [provider]    Allergies    Patient has no known allergies.  Review of Systems   Review of Systems  Constitutional:  Negative for  fever.  HENT:  Negative for ear pain.   Eyes:  Negative for pain.  Respiratory:  Positive for cough and shortness of breath.   Cardiovascular:  Negative for chest pain.  Gastrointestinal:  Negative for abdominal pain.  Genitourinary:  Negative for flank pain.  Musculoskeletal:  Negative for back pain.  Skin:  Negative for rash.  Neurological:  Negative for headaches.   Physical Exam Updated Vital Signs BP (!) 169/81 (BP Location: Right Arm)   Pulse 97   Temp 98.4 F (36.9 C) (Oral)   Resp (!) 24   Ht 5\' 4"  (1.626 m)   Wt 60.8 kg   SpO2 98%   BMI 23.01 kg/m   Physical Exam Constitutional:      General: She is not in acute distress.    Appearance: Normal appearance.  HENT:     Head: Normocephalic.     Nose: Nose normal.  Eyes:     Extraocular Movements: Extraocular movements intact.  Cardiovascular:     Rate and Rhythm: Tachycardia present.  Pulmonary:     Effort: Pulmonary effort is normal.     Comments: Left side diminished breath sounds. Musculoskeletal:        General: Normal range of motion.     Cervical back: Normal range of motion.  Neurological:     General: No focal deficit present.     Mental Status: She is alert. Mental status is at baseline.    ED Results / Procedures / Treatments   Labs (all labs ordered are listed, but only abnormal results are displayed) Labs Reviewed  COMPREHENSIVE METABOLIC PANEL - Abnormal; Notable for the following components:      Result Value   Glucose, Bld 123 (*)    Albumin 3.2 (*)    All other components within normal limits  CBC - Abnormal; Notable for the following components:   Hemoglobin 11.6 (*)    All other components within normal limits  HEMOGLOBIN A1C - Abnormal; Notable for the following components:   Hgb A1c MFr Bld 6.3 (*)    All other components within normal limits  CBC - Abnormal; Notable for the following components:   RBC 3.86 (*)    Hemoglobin 11.1 (*)    HCT 35.3 (*)    All other components  within normal limits  BASIC METABOLIC PANEL - Abnormal; Notable for the following components:   Glucose, Bld 149 (*)    All other components within normal limits  GLUCOSE, CAPILLARY - Abnormal; Notable for the following components:   Glucose-Capillary 147 (*)    All other components within normal limits  GLUCOSE, CAPILLARY - Abnormal; Notable for the following components:   Glucose-Capillary 109 (*)    All other components within normal limits  GLUCOSE, CAPILLARY - Abnormal; Notable for the following components:   Glucose-Capillary 117 (*)    All other components within normal limits  BODY FLUID CELL COUNT WITH DIFFERENTIAL - Abnormal; Notable for the  following components:   Color, Fluid RED (*)    Appearance, Fluid GROSSLY BLOODY (*)    Neutrophil Count, Fluid 83 (*)    Monocyte-Macrophage-Serous Fluid 2 (*)    All other components within normal limits  CBC - Abnormal; Notable for the following components:   RBC 3.55 (*)    Hemoglobin 10.4 (*)    HCT 32.2 (*)    All other components within normal limits  GLUCOSE, CAPILLARY - Abnormal; Notable for the following components:   Glucose-Capillary 101 (*)    All other components within normal limits  GLUCOSE, CAPILLARY - Abnormal; Notable for the following components:   Glucose-Capillary 67 (*)    All other components within normal limits  GLUCOSE, CAPILLARY - Abnormal; Notable for the following components:   Glucose-Capillary 115 (*)    All other components within normal limits  CBC WITH DIFFERENTIAL/PLATELET - Abnormal; Notable for the following components:   Neutro Abs 7.9 (*)    All other components within normal limits  COMPREHENSIVE METABOLIC PANEL - Abnormal; Notable for the following components:   CO2 18 (*)    BUN 33 (*)    Creatinine, Ser 1.84 (*)    Calcium 8.7 (*)    Albumin 2.4 (*)    AST 45 (*)    Total Bilirubin 2.1 (*)    GFR, Estimated 28 (*)    All other components within normal limits  GLUCOSE, CAPILLARY -  Abnormal; Notable for the following components:   Glucose-Capillary 111 (*)    All other components within normal limits  BASIC METABOLIC PANEL - Abnormal; Notable for the following components:   Glucose, Bld 160 (*)    BUN 47 (*)    Creatinine, Ser 1.67 (*)    Calcium 8.8 (*)    GFR, Estimated 31 (*)    All other components within normal limits  CBC - Abnormal; Notable for the following components:   Hemoglobin 11.6 (*)    All other components within normal limits  GLUCOSE, CAPILLARY - Abnormal; Notable for the following components:   Glucose-Capillary 170 (*)    All other components within normal limits  GLUCOSE, CAPILLARY - Abnormal; Notable for the following components:   Glucose-Capillary 151 (*)    All other components within normal limits  GLUCOSE, CAPILLARY - Abnormal; Notable for the following components:   Glucose-Capillary 123 (*)    All other components within normal limits  GLUCOSE, CAPILLARY - Abnormal; Notable for the following components:   Glucose-Capillary 104 (*)    All other components within normal limits  GLUCOSE, CAPILLARY - Abnormal; Notable for the following components:   Glucose-Capillary 163 (*)    All other components within normal limits  GLUCOSE, CAPILLARY - Abnormal; Notable for the following components:   Glucose-Capillary 171 (*)    All other components within normal limits  BASIC METABOLIC PANEL - Abnormal; Notable for the following components:   Sodium 131 (*)    Glucose, Bld 145 (*)    BUN 46 (*)    Creatinine, Ser 1.10 (*)    Calcium 8.7 (*)    GFR, Estimated 52 (*)    All other components within normal limits  GLUCOSE, CAPILLARY - Abnormal; Notable for the following components:   Glucose-Capillary 197 (*)    All other components within normal limits  GLUCOSE, CAPILLARY - Abnormal; Notable for the following components:   Glucose-Capillary 114 (*)    All other components within normal limits  GLUCOSE, CAPILLARY - Abnormal; Notable for  the following components:   Glucose-Capillary 125 (*)    All other components within normal limits  GLUCOSE, CAPILLARY - Abnormal; Notable for the following components:   Glucose-Capillary 198 (*)    All other components within normal limits  CBC - Abnormal; Notable for the following components:   Hemoglobin 11.4 (*)    HCT 34.8 (*)    All other components within normal limits  BASIC METABOLIC PANEL - Abnormal; Notable for the following components:   Glucose, Bld 194 (*)    BUN 33 (*)    Calcium 8.6 (*)    All other components within normal limits  GLUCOSE, CAPILLARY - Abnormal; Notable for the following components:   Glucose-Capillary 175 (*)    All other components within normal limits  GLUCOSE, CAPILLARY - Abnormal; Notable for the following components:   Glucose-Capillary 144 (*)    All other components within normal limits  GLUCOSE, CAPILLARY - Abnormal; Notable for the following components:   Glucose-Capillary 216 (*)    All other components within normal limits  GLUCOSE, CAPILLARY - Abnormal; Notable for the following components:   Glucose-Capillary 173 (*)    All other components within normal limits  GLUCOSE, CAPILLARY - Abnormal; Notable for the following components:   Glucose-Capillary 135 (*)    All other components within normal limits  GLUCOSE, CAPILLARY - Abnormal; Notable for the following components:   Glucose-Capillary 211 (*)    All other components within normal limits  GLUCOSE, CAPILLARY - Abnormal; Notable for the following components:   Glucose-Capillary 147 (*)    All other components within normal limits  GLUCOSE, CAPILLARY - Abnormal; Notable for the following components:   Glucose-Capillary 221 (*)    All other components within normal limits  GLUCOSE, CAPILLARY - Abnormal; Notable for the following components:   Glucose-Capillary 181 (*)    All other components within normal limits  CBG MONITORING, ED - Abnormal; Notable for the following  components:   Glucose-Capillary 119 (*)    All other components within normal limits  CBG MONITORING, ED - Abnormal; Notable for the following components:   Glucose-Capillary 144 (*)    All other components within normal limits  POCT I-STAT, CHEM 8 - Abnormal; Notable for the following components:   Glucose, Bld 170 (*)    Hemoglobin 11.2 (*)    HCT 33.0 (*)    All other components within normal limits  POCT I-STAT 7, (LYTES, BLD GAS, ICA,H+H) - Abnormal; Notable for the following components:   pH, Arterial 7.300 (*)    pCO2 arterial 56.7 (*)    HCT 34.0 (*)    Hemoglobin 11.6 (*)    All other components within normal limits  POCT I-STAT 7, (LYTES, BLD GAS, ICA,H+H) - Abnormal; Notable for the following components:   pO2, Arterial 162 (*)    HCT 32.0 (*)    Hemoglobin 10.9 (*)    All other components within normal limits  POCT I-STAT 7, (LYTES, BLD GAS, ICA,H+H) - Abnormal; Notable for the following components:   pO2, Arterial 77 (*)    HCT 33.0 (*)    Hemoglobin 11.2 (*)    All other components within normal limits  CULTURE, BLOOD (ROUTINE X 2)  CULTURE, BLOOD (ROUTINE X 2)  RESP PANEL BY RT-PCR (FLU A&B, COVID) ARPGX2  MRSA NEXT GEN BY PCR, NASAL  CULTURE, BODY FLUID W GRAM STAIN -BOTTLE  GRAM STAIN  CULTURE, RESPIRATORY W GRAM STAIN  GRAM STAIN/BODY FLUID CULTURE  CBC WITH  DIFFERENTIAL/PLATELET  BASIC METABOLIC PANEL  LACTIC ACID, PLASMA  BRAIN NATRIURETIC PEPTIDE  GLUCOSE, CAPILLARY  PHOSPHORUS  MAGNESIUM  GLUCOSE, BODY FLUID OTHER            PROTEIN, BODY FLUID (OTHER)  LD, BODY FLUID (OTHER)  PH, BODY FLUID  GLUCOSE, CAPILLARY  GLUCOSE, CAPILLARY  GLUCOSE, CAPILLARY  GLUCOSE, CAPILLARY  GLUCOSE, CAPILLARY  GLUCOSE, CAPILLARY  GLUCOSE, CAPILLARY  MAGNESIUM  PHOSPHORUS  CBG MONITORING, ED  CBG MONITORING, ED  CYTOLOGY - NON PAP  CYTOLOGY - NON PAP  TROPONIN I (HIGH SENSITIVITY)    EKG EKG Interpretation  Date/Time:  Sunday August 05 2020 18:29:57  EDT Ventricular Rate:  120 PR Interval:  144 QRS Duration: 72 QT Interval:  340 QTC Calculation: 481 R Axis:   53 Text Interpretation: Sinus tachycardia Probable anterior infarct, old Confirmed by Thamas Jaegers ([id number]) on 08/05/2020 6:34:22 PM  Radiology No results found.  Procedures .Critical Care  Date/Time: 08/05/2020 8:23 PM Performed by: Luna Fuse, MD Authorized by: Luna Fuse, MD   Critical care provider statement:    Critical care time (minutes):  40   Critical care time was exclusive of:  Separately billable procedures and treating other patients   Critical care was necessary to treat or prevent imminent or life-threatening deterioration of the following conditions:  Respiratory failure   Medications Ordered in ED Medications  albuterol (VENTOLIN HFA) 108 (90 Base) MCG/ACT inhaler 2 puff ( Inhalation MAR Unhold 08/07/20 1721)  hydrocortisone cream 1 % 1 application ( Topical MAR Unhold 08/07/20 1721)  ondansetron (ZOFRAN) tablet 4 mg ( Oral MAR Unhold 08/07/20 1721)    Or  ondansetron (ZOFRAN) injection 4 mg ( Intravenous MAR Unhold 08/07/20 1721)  Chlorhexidine Gluconate Cloth 2 % PADS 6 each (6 each Topical Given by Other 08/13/20 1009)  0.9 %  sodium chloride infusion ( Intravenous Stopped 08/09/20 1528)  guaiFENesin (ROBITUSSIN) 100 MG/5ML solution 200 mg (has no administration in time range)  acetaminophen (TYLENOL) tablet 650 mg (650 mg Oral Given 08/12/20 2241)    Or  acetaminophen (TYLENOL) suppository 650 mg ( Rectal See Alternative 08/12/20 2241)  hydrALAZINE (APRESOLINE) tablet 25 mg (has no administration in time range)  metoprolol tartrate (LOPRESSOR) tablet 25 mg (25 mg Oral Given 08/13/20 2123)  osimertinib mesylate (TAGRISSO) tablet 80 mg (80 mg Oral Given 08/13/20 1005)  colchicine tablet 0.6 mg (0.6 mg Oral Not Given 08/13/20 1004)  indomethacin (INDOCIN) capsule 50 mg (50 mg Oral Given 08/13/20 1929)  amLODipine (NORVASC) tablet 5 mg (5 mg Oral Given  08/13/20 1004)  furosemide (LASIX) injection 20 mg (20 mg Intravenous Given 08/06/20 0824)  0.9 %  sodium chloride infusion (500 mLs  Other (enter comment in med admin window) 08/07/20 1647)  furosemide (LASIX) injection 20 mg (20 mg Intravenous Given 08/07/20 2213)  dextrose 50 % solution 25 mL (25 mLs Intravenous Given 08/08/20 0912)  dextrose 50 % solution (  Duplicate 02/25/30 3557)  furosemide (LASIX) injection 40 mg (40 mg Intravenous Given 08/08/20 1350)  furosemide (LASIX) injection 40 mg (40 mg Intravenous Given 08/08/20 2256)  albumin human 25 % solution 12.5 g ( Intravenous Paused 08/09/20 0852)  albumin human 25 % solution 12.5 g ( Intravenous Stopped 08/09/20 1809)    ED Course  I have reviewed the triage vital signs and the nursing notes.  Pertinent labs & imaging results that were available during my care of the patient were reviewed by me and considered in my medical decision  making (see chart for details).    MDM Rules/Calculators/A&P                          Patient had chest x-ray done concerning for globular shaped heart and left-sided effusion.  CT of the chest ordered and pending.  She sats about 89% on room air which is low doing well on 2 L nasal cannula which is appropriate.  Work-up shows large pericardial effusion, 2.1 cm in thickness.  Case discussed with on-call cardiology who will see the patient at [facility].  Hospitalist consulted for admission to [facility]. Patient's vital signs otherwise are hypertensive, heart rate in the 110 bpm range blood pressure in the 937D systolic range.  O2 saturation 97% on 2 L nasal cannula which is appropriate.   Final Clinical Impression(s) / ED Diagnoses Final diagnoses:  Pericardial effusion  Hypoxemia    Rx / DC Orders ED Discharge Orders     None        Luna Fuse, MD 08/13/20 2328

## 2020-08-05 NOTE — ED Notes (Signed)
RN had 2 unsuccessful IV and blood attempts

## 2020-08-06 ENCOUNTER — Encounter (HOSPITAL_COMMUNITY): Payer: Self-pay | Admitting: Internal Medicine

## 2020-08-06 ENCOUNTER — Inpatient Hospital Stay (HOSPITAL_COMMUNITY): Payer: Medicare Other

## 2020-08-06 LAB — COMPREHENSIVE METABOLIC PANEL
ALT: 19 U/L (ref 0–44)
AST: 29 U/L (ref 15–41)
Albumin: 3.2 g/dL — ABNORMAL LOW (ref 3.5–5.0)
Alkaline Phosphatase: 85 U/L (ref 38–126)
Anion gap: 6 (ref 5–15)
BUN: 16 mg/dL (ref 8–23)
CO2: 27 mmol/L (ref 22–32)
Calcium: 8.9 mg/dL (ref 8.9–10.3)
Chloride: 106 mmol/L (ref 98–111)
Creatinine, Ser: 0.82 mg/dL (ref 0.44–1.00)
GFR, Estimated: >60 mL/min (ref >60)
Glucose, Bld: 123 mg/dL — ABNORMAL HIGH (ref 70–99)
Potassium: 4.3 mmol/L (ref 3.5–5.1)
Sodium: 139 mmol/L (ref 135–145)
Total Bilirubin: 0.7 mg/dL (ref 0.3–1.2)
Total Protein: 7.4 g/dL (ref 6.5–8.1)

## 2020-08-06 LAB — CBC
HCT: 36.2 % (ref 36.0–46.0)
Hemoglobin: 11.6 g/dL — ABNORMAL LOW (ref 12.0–15.0)
MCH: 29.5 pg (ref 26.0–34.0)
MCHC: 32 g/dL (ref 30.0–36.0)
MCV: 92.1 fL (ref 80.0–100.0)
Platelets: 245 10*3/uL (ref 150–400)
RBC: 3.93 MIL/uL (ref 3.87–5.11)
RDW: 13.4 % (ref 11.5–15.5)
WBC: 5.7 10*3/uL (ref 4.0–10.5)
nRBC: 0 % (ref 0.0–0.2)

## 2020-08-06 LAB — ECHOCARDIOGRAM COMPLETE
Area-P 1/2: 5.13 cm2
Height: 64 in
S' Lateral: 2.1 cm

## 2020-08-06 LAB — HEMOGLOBIN A1C
Hgb A1c MFr Bld: 6.3 % — ABNORMAL HIGH (ref 4.8–5.6)
Mean Plasma Glucose: 134 mg/dL

## 2020-08-06 LAB — CBG MONITORING, ED
Glucose-Capillary: 119 mg/dL — ABNORMAL HIGH (ref 70–99)
Glucose-Capillary: 144 mg/dL — ABNORMAL HIGH (ref 70–99)
Glucose-Capillary: 88 mg/dL (ref 70–99)
Glucose-Capillary: 89 mg/dL (ref 70–99)

## 2020-08-06 LAB — GLUCOSE, CAPILLARY
Glucose-Capillary: 77 mg/dL (ref 70–99)
Glucose-Capillary: 147 mg/dL — ABNORMAL HIGH (ref 70–99)

## 2020-08-06 MED ORDER — INSULIN ASPART 100 UNIT/ML IJ SOLN
0.0000 [IU] | Freq: Every day | INTRAMUSCULAR | Status: DC
  Filled 2020-08-06: qty 0.05

## 2020-08-06 MED ORDER — INSULIN ASPART 100 UNIT/ML IJ SOLN
0.0000 [IU] | Freq: Three times a day (TID) | INTRAMUSCULAR | Status: DC
  Administered 2020-08-06: 1 [IU] via SUBCUTANEOUS
  Filled 2020-08-06: qty 0.09

## 2020-08-06 MED ORDER — PANTOPRAZOLE SODIUM 40 MG PO TBEC
40.0000 mg | DELAYED_RELEASE_TABLET | Freq: Two times a day (BID) | ORAL | Status: DC
  Administered 2020-08-06 – 2020-08-07 (×3): 40 mg via ORAL
  Filled 2020-08-06 (×3): qty 1

## 2020-08-06 MED ORDER — IBUPROFEN 200 MG PO TABS: 400.0000 mg | ORAL_TABLET | Freq: Three times a day (TID) | ORAL | Status: DC

## 2020-08-06 MED ORDER — FUROSEMIDE 10 MG/ML IJ SOLN
20.0000 mg | Freq: Once | INTRAMUSCULAR | Status: AC
  Administered 2020-08-06: 20 mg via INTRAVENOUS
  Filled 2020-08-06: qty 4

## 2020-08-06 MED ORDER — HYDRALAZINE HCL 25 MG PO TABS
25.0000 mg | ORAL_TABLET | Freq: Four times a day (QID) | ORAL | Status: DC | PRN
  Administered 2020-08-06: 25 mg via ORAL
  Filled 2020-08-06: qty 1

## 2020-08-06 NOTE — ED Notes (Signed)
Per pharmacy, "Oral chemo has to be supplied by pt but it has a major drug interaction w/ colchicine so need to f/u w/ MD if it should even be continued here".   Patient reports she took her own oral chemo (Tagrisso) last night and did not want the colchicine today since she was told it could interact with each other.

## 2020-08-06 NOTE — ED Notes (Signed)
CARELINK at bedside.  

## 2020-08-06 NOTE — ED Notes (Addendum)
Per Iruku, MD "can we hold the colchicine if its not for cardiology".   Per Regalado, "cardiology would like to continue with colchicine".   Per [name], MD "ok to hold tagrisso for now. ".   Per Iruku, MD "OK to give colchicine today, hold tagrisso"  Per Regalado, "I just confirmed with niece, she hasnt been taking colchicine outpatient"

## 2020-08-06 NOTE — ED Notes (Signed)
ECHO at bedside.

## 2020-08-06 NOTE — Progress Notes (Signed)
PROGRESS NOTE    Sara Ferguson  AOZ:[id number] DOB: 1945/11/11 DOA: 08/05/2020 PCP: [name], MD   Brief Narrative: 75 year old with past medical history significant for non-small cell lung cancer stage IV, recurrent pleural effusion, diabetes, essential hypertension, who presents to the ED complaining of progressive shortness of breath, lower extremity edema and chest discomfort.  Patient has a history of malignant pleural effusion.  Patient currently on oral chemotherapy. Patient was found to be hypoxic 89 on room air.  Remained stable on 2 L of oxygen.  CT chest showed large pericardial effusion with 2.1 cm thickness.  Left breast density possible carcinoma.  Multiple lymphadenopathy.  Patient awaiting bed at [facility] for cardiology evaluation.  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 CT chest left greater than right heterogenous interstitial and air opacity concerning for pulmonary edema or infection.  Left lower lobe bronchi occluded.  Hold IV fluids.  Check ECHO stat.  Awaiting bed at [facility].  Will give IV lasix time I.  Dr [name] will see patient in consultation. He recommend continuation of colchicine and NSAID>will need to satrt NSAID if no pericardiocentesis is needed.   Might need pericardiocentesis.  No fever, no evidence of infection.   HTN;  Change hydralazine to PRN to avoid hypotension in setting of pericardial effusion.    DM: Continue with SSI.  HbA1c pending.   Adenocarcinoma of left Lung stage IV;  CT chest 6/20: Left breast heterogeneous density with skin thickening is concerning for  carcinoma the skin thickening could be due to edema. Large pericardial effusion, measuring up to 2.1 cm in thickness, progressed. Left-greater-than-right heterogeneous interstitial and airspace opacity, concerning for pulmonary edema or infection. Left lower lobe bronchi are occluded. Reportedly the patient has a history of non-small cell lung carcinoma, but no distinct mass is visualized on this noncontrast study. Aeration of both lungs is much worse than on 04/19/2020. Left greater than right axillary and mediastinal lymphadenopathy, likely metastatic. Sclerotic lesions of the left second, right third and left seventh ribs, consistent with metastatic disease. 16 mm left adrenal mass, concerning for metastatic disease. Dr.  [name] aware of admission  Plan to hold Tagrisso while patient is on Colchicine.   Left Breast heterogenous density with skin thickening is concerning for carcinoma;  -Patient will need Mammogram.    Estimated body mass index is 24.53 kg/m as calculated from the following:   Height as of this encounter: 5\' 4"  (1.626 m).   Weight as of 06/21/20: 64.8 kg.   DVT prophylaxis: SCD Code Status: Full code Family Communication: care discussed with patient.  Disposition Plan:  Status is: Inpatient  Remains inpatient appropriate because:IV treatments appropriate due to intensity of illness or inability to take PO  Dispo: The patient is from: Home              Anticipated d/c is to: Home              Patient currently is not medically stable to d/c.   Difficult to place patient No        Consultants:  Cardiology Oncology   Procedures:  ECHO  Antimicrobials:    Subjective: She  is alert, report SOB and productive cough.  Denies chest pain.    Objective: Vitals:   08/06/20 0330 08/06/20 0610 08/06/20 0630 08/06/20 0700  BP: (!) 149/59 (!) 154/55 (!) 166/74 (!) 152/68  Pulse: 93 92 95 99  Resp: 19 20 19    Temp:      TempSrc:      SpO2: 94% 95% 93% 98%   Height:       No intake or output data in the 24 hours ending 08/06/20 0802 There were no vitals filed for this visit.  Examination:  General exam: Appears calm and comfortable  Respiratory system: CTA Cardiovascular system: S1 & S2 heard, RRR. No JVD, murmurs, rubs, gallops or clicks. No pedal edema. Gastrointestinal system: Abdomen is nondistended, soft and nontender. No organomegaly or masses felt. Normal bowel sounds heard. Central nervous system: Alert and oriented. No focal neurological deficits. Extremities: Symmetric 5 x 5 power.   Data Reviewed: I have personally reviewed following labs and imaging studies  CBC: Recent Labs  Lab 08/05/20 2000 08/06/20 0330  WBC 4.6 5.7  NEUTROABS 3.3  --   HGB 13.3 11.6*  HCT 40.7 36.2  MCV 91.1 92.1  PLT 231 458   Basic Metabolic Panel: Recent Labs  Lab 08/05/20 2000 08/06/20 0330  NA 138 139  K 3.9 4.3  CL 103 106  CO2 27 27  GLUCOSE 98 123*  BUN 14 16  CREATININE 0.68 0.82  CALCIUM 9.3 8.9   GFR: CrCl cannot be calculated (Unknown ideal weight.). Liver Function Tests: Recent Labs  Lab 08/06/20 0330  AST 29  ALT 19  ALKPHOS 85  BILITOT 0.7  PROT 7.4  ALBUMIN 3.2*   No results for input(s): LIPASE, AMYLASE in the last 168 hours. No results for input(s): AMMONIA in the last 168 hours. Coagulation Profile: No results for input(s): INR, PROTIME in the last 168 hours. Cardiac Enzymes: No results for input(s): CKTOTAL, CKMB, CKMBINDEX, TROPONINI in the last 168 hours. BNP (last 3 results) No results for input(s): PROBNP in the last 8760 hours. HbA1C: No results for input(s): HGBA1C in the last 72 hours. CBG: Recent Labs  Lab 08/06/20 0053 08/06/20 0706  GLUCAP 119* 88   Lipid Profile: No results for input(s): CHOL, HDL, LDLCALC, TRIG, CHOLHDL, LDLDIRECT in the last 72 hours. Thyroid Function Tests: No results for input(s): TSH, T4TOTAL, FREET4, T3FREE, THYROIDAB in the last 72 hours. Anemia  Panel: No results for input(s): VITAMINB12, FOLATE, FERRITIN, TIBC, IRON, RETICCTPCT in the last 72 hours. Sepsis Labs: Recent Labs  Lab 08/05/20 2000  LATICACIDVEN 1.3    Recent Results (from the past 240 hour(s))  Resp Panel by RT-PCR (Flu A&B, Covid) Nasopharyngeal Swab     Status: None   Collection Time: 08/05/20  6:30 PM   Specimen: Nasopharyngeal Swab; Nasopharyngeal(NP) swabs in vial transport medium  Result Value Ref Range Status   SARS Coronavirus 2 by RT PCR NEGATIVE NEGATIVE Final    Comment: (NOTE) SARS-CoV-2 target nucleic acids are NOT DETECTED.  The SARS-CoV-2 RNA is generally detectable in upper respiratory specimens during the acute phase of infection. The lowest concentration of SARS-CoV-2 viral copies this assay can detect is 138 copies/mL. A negative result does not preclude SARS-Cov-2 infection and should not be used as the sole basis for treatment or other patient management decisions. A negative result may occur with  improper specimen collection/handling, submission of specimen other than nasopharyngeal swab, presence of viral mutation(s) within the areas targeted by this assay,  and inadequate number of viral copies(<138 copies/mL). A negative result must be combined with clinical observations, patient history, and epidemiological information. The expected result is Negative.  Fact Sheet for Patients:  EntrepreneurPulse.com.au  Fact Sheet for Healthcare Providers:  IncredibleEmployment.be  This test is no t yet approved or cleared by the [place] FDA and  has been authorized for detection and/or diagnosis of SARS-CoV-2 by FDA under an Emergency Use Authorization (EUA). This EUA will remain  in effect (meaning this test can be used) for the duration of the COVID-19 declaration under Section 564(b)(1) of the Act, 21 U.S.C.section 360bbb-3(b)(1), unless the authorization is terminated  or revoked sooner.        Influenza A by PCR NEGATIVE NEGATIVE Final   Influenza B by PCR NEGATIVE NEGATIVE Final    Comment: (NOTE) The Xpert Xpress SARS-CoV-2/FLU/RSV plus assay is intended as an aid in the diagnosis of influenza from Nasopharyngeal swab specimens and should not be used as a sole basis for treatment. Nasal washings and aspirates are unacceptable for Xpert Xpress SARS-CoV-2/FLU/RSV testing.  Fact Sheet for Patients: EntrepreneurPulse.com.au  Fact Sheet for Healthcare Providers: IncredibleEmployment.be  This test is not yet approved or cleared by the [place] FDA and has been authorized for detection and/or diagnosis of SARS-CoV-2 by FDA under an Emergency Use Authorization (EUA). This EUA will remain in effect (meaning this test can be used) for the duration of the COVID-19 declaration under Section 564(b)(1) of the Act, 21 U.S.C. section 360bbb-3(b)(1), unless the authorization is terminated or revoked.  Performed at [facility], [place] [address] [place], [place] [address]          Radiology Studies: CT Chest Wo Contrast  Result Date: 08/05/2020 CLINICAL DATA:  Shortness of breath EXAM: CT CHEST WITHOUT CONTRAST TECHNIQUE: Multidetector CT imaging of the chest was performed following the standard protocol without IV contrast. COMPARISON:  04/19/2020 FINDINGS: Cardiovascular: Large pericardial effusion measuring approximately 2.1 cm in thickness. Calcific aortic atherosclerosis. Mediastinum/Nodes: Increased density within the upper mediastinum, likely lymphadenopathy. There is left greater than right axillary adenopathy. Asymmetric density within the left breast. There is also left breast skin thickening. Lungs/Pleura: Left-greater-than-right heterogeneous interstitial and airspace opacity. Peribronchial thickening. Left lower lobe bronchi are occluded. There is complete complex of the left lower lobe. Upper Abdomen: 16 mm left  adrenal mass. Musculoskeletal: Sclerotic lesions of the left second,, right third and left seventh ribs. Numerous sclerotic lesions within the vertebral bodies, greatest at T3. IMPRESSION: 1. Left breast heterogeneous density with skin thickening is concerning for carcinoma the skin thickening could be due to edema. Correlation with mammography is recommended. 2. Large pericardial effusion, measuring up to 2.1 cm in thickness, progressed. 3. Left-greater-than-right heterogeneous interstitial and airspace opacity, concerning for pulmonary edema or infection. Left lower lobe bronchi are occluded. Reportedly the patient has a history of non-small cell lung carcinoma, but no distinct mass is visualized on this noncontrast study. Aeration of both lungs is much worse than on 04/19/2020. 4. Left greater than right axillary and mediastinal lymphadenopathy, likely metastatic. 5. Sclerotic lesions of the left second, right third and left seventh ribs, consistent with metastatic disease. 6. 16 mm left adrenal mass, concerning for metastatic disease. Aortic Atherosclerosis (ICD10-I70.0). Electronically Signed   By: Ulyses Jarred M.D.   On: 08/05/2020 20:53   DG Chest Port 1 View  Result Date: 08/05/2020 CLINICAL DATA:  Cough and shortness of breath. EXAM: PORTABLE CHEST 1 VIEW COMPARISON:  February 23, 2020 FINDINGS: Markedly  enlarged and globular cardiac silhouette. Calcific atherosclerotic disease of the aorta. Probable left pleural effusion. Left lower lobe airspace disease versus atelectasis. Bilateral interstitial opacities with central predominance. Osseous structures are without acute abnormality. Soft tissues are grossly normal. IMPRESSION: 1. Markedly enlarged and globular cardiac silhouette. Cardiomyopathy versus enlarging pericardial effusion. 2. Probable left pleural effusion with left lower lobe airspace disease versus atelectasis. 3. Bilateral interstitial opacities with central predominance may represent  interstitial pulmonary edema. Electronically Signed   By: Fidela Salisbury M.D.   On: 08/05/2020 17:46        Scheduled Meds:  colchicine  0.6 mg Oral BID   furosemide  20 mg Intravenous Once   insulin aspart  0-5 Units Subcutaneous QHS   insulin aspart  0-9 Units Subcutaneous TID WC   metoprolol tartrate  25 mg Oral BID   osimertinib mesylate  80 mg Oral Daily   potassium chloride  20 mEq Oral Daily   Continuous Infusions:   LOS: 1 day    Time spent: 35 minutes    Rashena Dowling A Deeanne Deininger, MD [facility]   If 7PM-7AM, please contact night-coverage [email or web address]  08/06/2020, 8:02 AM

## 2020-08-06 NOTE — Consult Note (Signed)
Referring Physician: Dr. Nelly Rout Sara Ferguson is an 75 y.o. female.                       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 Her oxygenation is improving with supplemental oxygen. She is awaiting repeat echocardiogram of heart. CT chest is suggestive of increasing pericardial effusion and possible lung infection.  Past Medical History:  Diagnosis Date   Arthritis    Diabetes mellitus without complication ([place])    Hypertension    Non-small cell lung cancer ([place])    Dx 12/30/19 by thoracentesis       Past Surgical History:  Procedure Laterality Date   ABDOMINAL HYSTERECTOMY     ECTOPIC PREGNANCY SURGERY      History reviewed. No pertinent family history. Social History:  reports that she has never smoked. She has never used smokeless tobacco. She reports previous alcohol use. She reports that she does not use drugs.  Allergies: No Known Allergies  (Not in a hospital admission)   Results for orders placed or performed during the hospital encounter of 08/05/20 (from the past 48 hour(s))  Resp Panel by RT-PCR (Flu A&B, Covid) Nasopharyngeal Swab     Status: None   Collection Time: 08/05/20  6:30 PM   Specimen: Nasopharyngeal Swab; Nasopharyngeal(NP) swabs in vial transport medium  Result Value Ref Range   SARS Coronavirus 2 by RT PCR NEGATIVE NEGATIVE    Comment: (NOTE) SARS-CoV-2 target nucleic acids are NOT DETECTED.  The SARS-CoV-2 RNA is generally detectable in upper respiratory specimens during the acute phase of infection. The lowest concentration of SARS-CoV-2 viral copies this assay can detect is 138 copies/mL. A negative result does not preclude SARS-Cov-2 infection and should not be used as the sole basis for treatment or other patient management decisions. A negative result may  occur with  improper specimen collection/handling, submission of specimen other than nasopharyngeal swab, presence of viral mutation(s) within the areas targeted by this assay, and inadequate number of viral copies(<138 copies/mL). A negative result must be combined with clinical observations, patient history, and epidemiological information. The expected result is Negative.  Fact Sheet for Patients:  EntrepreneurPulse.com.au  Fact Sheet for Healthcare Providers:  IncredibleEmployment.be  This test is no t yet approved or cleared by the [place] FDA and  has been authorized for detection and/or diagnosis of SARS-CoV-2 by FDA under an Emergency Use Authorization (EUA). This EUA will remain  in effect (meaning this test can be used) for the duration of the COVID-19 declaration under Section 564(b)(1) of the Act, 21 U.S.C.section 360bbb-3(b)(1), unless the authorization is terminated  or revoked sooner.       Influenza A by PCR NEGATIVE NEGATIVE   Influenza B by PCR NEGATIVE NEGATIVE    Comment: (NOTE) The Xpert Xpress SARS-CoV-2/FLU/RSV plus assay is intended as an aid in the diagnosis of influenza from Nasopharyngeal swab specimens and should not be used as a sole basis for treatment. Nasal washings and aspirates are unacceptable for Xpert Xpress SARS-CoV-2/FLU/RSV testing.  Fact Sheet for Patients: EntrepreneurPulse.com.au  Fact Sheet for Healthcare Providers: IncredibleEmployment.be  This test is not yet approved or cleared by the [place] FDA and has been authorized for detection and/or diagnosis of SARS-CoV-2 by FDA under an Emergency Use Authorization (EUA). This EUA will remain in  effect (meaning this test can be used) for the duration of the COVID-19 declaration under Section 564(b)(1) of the Act, 21 U.S.C. section 360bbb-3(b)(1), unless the authorization is terminated  or revoked.  Performed at [facility], [place] [address] [place], [place] [address]   CBC with Differential     Status: None   Collection Time: 08/05/20  8:00 PM  Result Value Ref Range   WBC 4.6 4.0 - 10.5 K/uL   RBC 4.47 3.87 - 5.11 MIL/uL   Hemoglobin 13.3 12.0 - 15.0 g/dL   HCT 40.7 36.0 - 46.0 %   MCV 91.1 80.0 - 100.0 fL   MCH 29.8 26.0 - 34.0 pg   MCHC 32.7 30.0 - 36.0 g/dL   RDW 13.3 11.5 - 15.5 %   Platelets 231 150 - 400 K/uL   nRBC 0.0 0.0 - 0.2 %   Neutrophils Relative % 74 %   Neutro Abs 3.3 1.7 - 7.7 K/uL   Lymphocytes Relative 16 %   Lymphs Abs 0.8 0.7 - 4.0 K/uL   Monocytes Relative 10 %   Monocytes Absolute 0.5 0.1 - 1.0 K/uL   Eosinophils Relative 0 %   Eosinophils Absolute 0.0 0.0 - 0.5 K/uL   Basophils Relative 0 %   Basophils Absolute 0.0 0.0 - 0.1 K/uL   Immature Granulocytes 0 %   Abs Immature Granulocytes 0.01 0.00 - 0.07 K/uL    Comment: Performed at [facility], [place] [address], [place], [place] [address]  Basic metabolic panel     Status: None   Collection Time: 08/05/20  8:00 PM  Result Value Ref Range   Sodium 138 135 - 145 mmol/L   Potassium 3.9 3.5 - 5.1 mmol/L   Chloride 103 98 - 111 mmol/L   CO2 27 22 - 32 mmol/L   Glucose, Bld 98 70 - 99 mg/dL    Comment: Glucose reference range applies only to samples taken after fasting for at least 8 hours.   BUN 14 8 - 23 mg/dL   Creatinine, Ser 0.68 0.44 - 1.00 mg/dL   Calcium 9.3 8.9 - 10.3 mg/dL   GFR, Estimated >60 >60 mL/min    Comment: (NOTE) Calculated using the CKD-EPI Creatinine Equation (2021)    Anion gap 8 5 - 15    Comment: Performed at [facility], [place] [address], [place], [place] [address]  Troponin I (High Sensitivity)     Status: None   Collection Time: 08/05/20  8:00 PM  Result Value Ref Range   Troponin I (High Sensitivity) 8 <18 ng/L    Comment: (NOTE) Elevated high sensitivity troponin I (hsTnI) values and  significant  changes across serial measurements may suggest ACS but many other  chronic and acute conditions are known to elevate hsTnI results.  Refer to the "Links" section for chest pain algorithms and additional  guidance. Performed at [facility], [place] [address], [place], [place] [address]   Lactic acid, plasma     Status: None   Collection Time: 08/05/20  8:00 PM  Result Value Ref Range   Lactic Acid, Venous 1.3 0.5 - 1.9 mmol/L    Comment: Performed at [facility], [place] [address], [place], [place] [address]  Brain natriuretic peptide     Status: None   Collection Time: 08/05/20  8:00 PM  Result Value Ref Range   B Natriuretic Peptide 26.9 0.0 - 100.0 pg/mL    Comment: Performed at [facility]  [place] [address] [place], [place] [address]  CBG monitoring, ED     Status: Abnormal   Collection Time: 08/06/20 12:53 AM  Result Value Ref Range   Glucose-Capillary 119 (H) 70 - 99 mg/dL    Comment: Glucose reference range applies only to samples taken after fasting for at least 8 hours.  Comprehensive metabolic panel     Status: Abnormal   Collection Time: 08/06/20  3:30 AM  Result Value Ref Range   Sodium 139 135 - 145 mmol/L   Potassium 4.3 3.5 - 5.1 mmol/L   Chloride 106 98 - 111 mmol/L   CO2 27 22 - 32 mmol/L   Glucose, Bld 123 (H) 70 - 99 mg/dL    Comment: Glucose reference range applies only to samples taken after fasting for at least 8 hours.   BUN 16 8 - 23 mg/dL   Creatinine, Ser 0.82 0.44 - 1.00 mg/dL   Calcium 8.9 8.9 - 10.3 mg/dL   Total Protein 7.4 6.5 - 8.1 g/dL   Albumin 3.2 (L) 3.5 - 5.0 g/dL   AST 29 15 - 41 U/L   ALT 19 0 - 44 U/L   Alkaline Phosphatase 85 38 - 126 U/L   Total Bilirubin 0.7 0.3 - 1.2 mg/dL   GFR, Estimated >60 >60 mL/min    Comment: (NOTE) Calculated using the CKD-EPI Creatinine Equation (2021)    Anion gap 6 5 - 15    Comment: Performed at [facility], [place]  [address], [place], [place] [address]  CBC     Status: Abnormal   Collection Time: 08/06/20  3:30 AM  Result Value Ref Range   WBC 5.7 4.0 - 10.5 K/uL   RBC 3.93 3.87 - 5.11 MIL/uL   Hemoglobin 11.6 (L) 12.0 - 15.0 g/dL   HCT 36.2 36.0 - 46.0 %   MCV 92.1 80.0 - 100.0 fL   MCH 29.5 26.0 - 34.0 pg   MCHC 32.0 30.0 - 36.0 g/dL   RDW 13.4 11.5 - 15.5 %   Platelets 245 150 - 400 K/uL   nRBC 0.0 0.0 - 0.2 %    Comment: Performed at [facility], [place] [address], [place],  [address]  CBG monitoring, ED     Status: None   Collection Time: 08/06/20  7:06 AM  Result Value Ref Range   Glucose-Capillary 88 70 - 99 mg/dL    Comment: Glucose reference range applies only to samples taken after fasting for at least 8 hours.  CBG monitoring, ED     Status: None   Collection Time: 08/06/20  8:20 AM  Result Value Ref Range   Glucose-Capillary 89 70 - 99 mg/dL    Comment: Glucose reference range applies only to samples taken after fasting for at least 8 hours.   CT Chest Wo Contrast  Result Date: 08/05/2020 CLINICAL DATA:  Shortness of breath EXAM: CT CHEST WITHOUT CONTRAST TECHNIQUE: Multidetector CT imaging of the chest was performed following the standard protocol without IV contrast. COMPARISON:  04/19/2020 FINDINGS: Cardiovascular: Large pericardial effusion measuring approximately 2.1 cm in thickness. Calcific aortic atherosclerosis. Mediastinum/Nodes: Increased density within the upper mediastinum, likely lymphadenopathy. There is left greater than right axillary adenopathy. Asymmetric density within the left breast. There is also left breast skin thickening. Lungs/Pleura: Left-greater-than-right heterogeneous interstitial and airspace opacity. Peribronchial thickening. Left lower lobe bronchi are occluded. There is complete complex of the left lower lobe. Upper Abdomen: 16 mm left adrenal  mass. Musculoskeletal: Sclerotic lesions of the left second,, right third and left  seventh ribs. Numerous sclerotic lesions within the vertebral bodies, greatest at T3. IMPRESSION: 1. Left breast heterogeneous density with skin thickening is concerning for carcinoma the skin thickening could be due to edema. Correlation with mammography is recommended. 2. Large pericardial effusion, measuring up to 2.1 cm in thickness, progressed. 3. Left-greater-than-right heterogeneous interstitial and airspace opacity, concerning for pulmonary edema or infection. Left lower lobe bronchi are occluded. Reportedly the patient has a history of non-small cell lung carcinoma, but no distinct mass is visualized on this noncontrast study. Aeration of both lungs is much worse than on 04/19/2020. 4. Left greater than right axillary and mediastinal lymphadenopathy, likely metastatic. 5. Sclerotic lesions of the left second, right third and left seventh ribs, consistent with metastatic disease. 6. 16 mm left adrenal mass, concerning for metastatic disease. Aortic Atherosclerosis (ICD10-I70.0). Electronically Signed   By: Ulyses Jarred M.D.   On: 08/05/2020 20:53   DG Chest Port 1 View  Result Date: 08/05/2020 CLINICAL DATA:  Cough and shortness of breath. EXAM: PORTABLE CHEST 1 VIEW COMPARISON:  February 23, 2020 FINDINGS: Markedly enlarged and globular cardiac silhouette. Calcific atherosclerotic disease of the aorta. Probable left pleural effusion. Left lower lobe airspace disease versus atelectasis. Bilateral interstitial opacities with central predominance. Osseous structures are without acute abnormality. Soft tissues are grossly normal. IMPRESSION: 1. Markedly enlarged and globular cardiac silhouette. Cardiomyopathy versus enlarging pericardial effusion. 2. Probable left pleural effusion with left lower lobe airspace disease versus atelectasis. 3. Bilateral interstitial opacities with central predominance may represent interstitial pulmonary edema. Electronically Signed   By: Fidela Salisbury M.D.   On:  08/05/2020 17:46    Review Of Systems Constitutional: No fever, chills, chronic weight loss. Eyes: No vision change, wears glasses. No discharge or pain. Ears: No hearing loss, No tinnitus. Respiratory: No asthma, COPD, pneumonias. Positive shortness of breath. No hemoptysis. Cardiovascular: Positive chest pain, palpitation, leg edema. Gastrointestinal: No nausea, vomiting, diarrhea, constipation. No GI bleed. No hepatitis. Genitourinary: No dysuria, hematuria, kidney stone. No incontinance. Neurological: No headache, stroke, seizures.  Psychiatry: No psych facility admission for anxiety, depression, suicide. No detox. Skin: No rash. Musculoskeletal: Positive joint pain, fibromyalgia. No neck pain, back pain. Lymphadenopathy: Positive lymphadenopathy. Hematology: No anemia or easy bruising.   Blood pressure (!) 164/62, pulse 94, temperature 97.9 F (36.6 C), temperature source Oral, resp. rate 18, height 5\' 4"  (1.626 m), SpO2 98 %. Body mass index is 24.53 kg/m. General appearance: alert, cooperative, appears stated age and no distress Head: Normocephalic, atraumatic. Eyes: Brown eyes, pink conjunctiva, corneas clear.  Neck: No adenopathy, no carotid bruit, Positive JVD at b30 degree angle, supple, symmetrical, trachea midline and thyroid not enlarged. Resp: Clearing to auscultation bilaterally. Cardio: Regular rate and rhythm, S1, S2 normal, II/VI systolic murmur, no click, rub or gallop GI: Soft, non-tender; bowel sounds normal; no organomegaly. Extremities: No edema, cyanosis or clubbing. Skin: Warm and dry.  Neurologic: Alert and oriented X 3, normal strength.   Assessment/Plan Acute on chronic respiratory failure with hypoxia S/P Left lung CA with metastasis Chronic pericardial effusion S/P left thoracentesis Type 2 DM HTN Protein calorie malnutrition  Plan: Awaiting echocardiogram. Possible pericardiocentesis.  Time spent: Review of old records, Lab, x-rays, EKG,  other cardiac tests, examination, discussion with patient/Nurse/Doctor over 70 minutes.  Birdie Riddle, MD  08/06/2020, 8:40 AM

## 2020-08-06 NOTE — ED Notes (Addendum)
Report given to Gabriel Cirri, RN at [facility].   Karen Chafe has been dispatched.

## 2020-08-06 NOTE — ED Notes (Signed)
Placed new purewick proceeding lasix

## 2020-08-06 NOTE — Progress Notes (Signed)
  Echocardiogram 2D Echocardiogram has been performed.  Biance Moncrief G Kemi Gell 08/06/2020, 11:22 AM

## 2020-08-06 NOTE — H&P (View-Only) (Signed)
Referring Physician: Dr. Nelly Ferguson Sara Ferguson is an 75 y.o. female.                       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 Her oxygenation is improving with supplemental oxygen. She is awaiting repeat echocardiogram of heart. CT chest is suggestive of increasing pericardial effusion and possible lung infection.  Past Medical History:  Diagnosis Date   Arthritis    Diabetes mellitus without complication ([place])    Hypertension    Non-small cell lung cancer ([place])    Dx 12/30/19 by thoracentesis       Past Surgical History:  Procedure Laterality Date   ABDOMINAL HYSTERECTOMY     ECTOPIC PREGNANCY SURGERY      History reviewed. No pertinent family history. Social History:  reports that she has never smoked. She has never used smokeless tobacco. She reports previous alcohol use. She reports that she does not use drugs.  Allergies: No Known Allergies  (Not in a hospital admission)   Results for orders placed or performed during the hospital encounter of 08/05/20 (from the past 48 hour(s))  Resp Panel by RT-PCR (Flu A&B, Covid) Nasopharyngeal Swab     Status: None   Collection Time: 08/05/20  6:30 PM   Specimen: Nasopharyngeal Swab; Nasopharyngeal(NP) swabs in vial transport medium  Result Value Ref Range   SARS Coronavirus 2 by RT PCR NEGATIVE NEGATIVE    Comment: (NOTE) SARS-CoV-2 target nucleic acids are NOT DETECTED.  The SARS-CoV-2 RNA is generally detectable in upper respiratory specimens during the acute phase of infection. The lowest concentration of SARS-CoV-2 viral copies this assay can detect is 138 copies/mL. A negative result does not preclude SARS-Cov-2 infection and should not be used as the sole basis for treatment or other patient management decisions. A negative result may  occur with  improper specimen collection/handling, submission of specimen other than nasopharyngeal swab, presence of viral mutation(s) within the areas targeted by this assay, and inadequate number of viral copies(<138 copies/mL). A negative result must be combined with clinical observations, patient history, and epidemiological information. The expected result is Negative.  Fact Sheet for Patients:  EntrepreneurPulse.com.au  Fact Sheet for Healthcare Providers:  IncredibleEmployment.be  This test is no t yet approved or cleared by the [place] FDA and  has been authorized for detection and/or diagnosis of SARS-CoV-2 by FDA under an Emergency Use Authorization (EUA). This EUA will remain  in effect (meaning this test can be used) for the duration of the COVID-19 declaration under Section 564(b)(1) of the Act, 21 U.S.C.section 360bbb-3(b)(1), unless the authorization is terminated  or revoked sooner.       Influenza A by PCR NEGATIVE NEGATIVE   Influenza B by PCR NEGATIVE NEGATIVE    Comment: (NOTE) The Xpert Xpress SARS-CoV-2/FLU/RSV plus assay is intended as an aid in the diagnosis of influenza from Nasopharyngeal swab specimens and should not be used as a sole basis for treatment. Nasal washings and aspirates are unacceptable for Xpert Xpress SARS-CoV-2/FLU/RSV testing.  Fact Sheet for Patients: EntrepreneurPulse.com.au  Fact Sheet for Healthcare Providers: IncredibleEmployment.be  This test is not yet approved or cleared by the [place] FDA and has been authorized for detection and/or diagnosis of SARS-CoV-2 by FDA under an Emergency Use Authorization (EUA). This EUA will remain in  effect (meaning this test can be used) for the duration of the COVID-19 declaration under Section 564(b)(1) of the Act, 21 U.S.C. section 360bbb-3(b)(1), unless the authorization is terminated  or revoked.  Performed at [facility], [place] [address] [place], [place] [address]   CBC with Differential     Status: None   Collection Time: 08/05/20  8:00 PM  Result Value Ref Range   WBC 4.6 4.0 - 10.5 K/uL   RBC 4.47 3.87 - 5.11 MIL/uL   Hemoglobin 13.3 12.0 - 15.0 g/dL   HCT 40.7 36.0 - 46.0 %   MCV 91.1 80.0 - 100.0 fL   MCH 29.8 26.0 - 34.0 pg   MCHC 32.7 30.0 - 36.0 g/dL   RDW 13.3 11.5 - 15.5 %   Platelets 231 150 - 400 K/uL   nRBC 0.0 0.0 - 0.2 %   Neutrophils Relative % 74 %   Neutro Abs 3.3 1.7 - 7.7 K/uL   Lymphocytes Relative 16 %   Lymphs Abs 0.8 0.7 - 4.0 K/uL   Monocytes Relative 10 %   Monocytes Absolute 0.5 0.1 - 1.0 K/uL   Eosinophils Relative 0 %   Eosinophils Absolute 0.0 0.0 - 0.5 K/uL   Basophils Relative 0 %   Basophils Absolute 0.0 0.0 - 0.1 K/uL   Immature Granulocytes 0 %   Abs Immature Granulocytes 0.01 0.00 - 0.07 K/uL    Comment: Performed at [facility], [place] [address] [place], [place] [address]  Basic metabolic panel     Status: None   Collection Time: 08/05/20  8:00 PM  Result Value Ref Range   Sodium 138 135 - 145 mmol/L   Potassium 3.9 3.5 - 5.1 mmol/L   Chloride 103 98 - 111 mmol/L   CO2 27 22 - 32 mmol/L   Glucose, Bld 98 70 - 99 mg/dL    Comment: Glucose reference range applies only to samples taken after fasting for at least 8 hours.   BUN 14 8 - 23 mg/dL   Creatinine, Ser 0.68 0.44 - 1.00 mg/dL   Calcium 9.3 8.9 - 10.3 mg/dL   GFR, Estimated >60 >60 mL/min    Comment: (NOTE) Calculated using the CKD-EPI Creatinine Equation (2021)    Anion gap 8 5 - 15    Comment: Performed at [facility], [place] [address] [place], [place] [address]  Troponin I (High Sensitivity)     Status: None   Collection Time: 08/05/20  8:00 PM  Result Value Ref Range   Troponin I (High Sensitivity) 8 <18 ng/L    Comment: (NOTE) Elevated high sensitivity troponin I (hsTnI) values and  significant  changes across serial measurements may suggest ACS but many other  chronic and acute conditions are known to elevate hsTnI results.  Refer to the "Links" section for chest pain algorithms and additional  guidance. Performed at [facility], [place] [address] [place], [place] [address]   Lactic acid, plasma     Status: None   Collection Time: 08/05/20  8:00 PM  Result Value Ref Range   Lactic Acid, Venous 1.3 0.5 - 1.9 mmol/L    Comment: Performed at [facility], [place] [address], [place], [place] [address]  Brain natriuretic peptide     Status: None   Collection Time: 08/05/20  8:00 PM  Result Value Ref Range   B Natriuretic Peptide 26.9 0.0 - 100.0 pg/mL    Comment: Performed at [facility]  [place] [address], [place], [place] [address]  CBG monitoring, ED     Status: Abnormal   Collection Time: 08/06/20 12:53 AM  Result Value Ref Range   Glucose-Capillary 119 (H) 70 - 99 mg/dL    Comment: Glucose reference range applies only to samples taken after fasting for at least 8 hours.  Comprehensive metabolic panel     Status: Abnormal   Collection Time: 08/06/20  3:30 AM  Result Value Ref Range   Sodium 139 135 - 145 mmol/L   Potassium 4.3 3.5 - 5.1 mmol/L   Chloride 106 98 - 111 mmol/L   CO2 27 22 - 32 mmol/L   Glucose, Bld 123 (H) 70 - 99 mg/dL    Comment: Glucose reference range applies only to samples taken after fasting for at least 8 hours.   BUN 16 8 - 23 mg/dL   Creatinine, Ser 0.82 0.44 - 1.00 mg/dL   Calcium 8.9 8.9 - 10.3 mg/dL   Total Protein 7.4 6.5 - 8.1 g/dL   Albumin 3.2 (L) 3.5 - 5.0 g/dL   AST 29 15 - 41 U/L   ALT 19 0 - 44 U/L   Alkaline Phosphatase 85 38 - 126 U/L   Total Bilirubin 0.7 0.3 - 1.2 mg/dL   GFR, Estimated >60 >60 mL/min    Comment: (NOTE) Calculated using the CKD-EPI Creatinine Equation (2021)    Anion gap 6 5 - 15    Comment: Performed at [facility], [place]  [address] [place], [place] [address]  CBC     Status: Abnormal   Collection Time: 08/06/20  3:30 AM  Result Value Ref Range   WBC 5.7 4.0 - 10.5 K/uL   RBC 3.93 3.87 - 5.11 MIL/uL   Hemoglobin 11.6 (L) 12.0 - 15.0 g/dL   HCT 36.2 36.0 - 46.0 %   MCV 92.1 80.0 - 100.0 fL   MCH 29.5 26.0 - 34.0 pg   MCHC 32.0 30.0 - 36.0 g/dL   RDW 13.4 11.5 - 15.5 %   Platelets 245 150 - 400 K/uL   nRBC 0.0 0.0 - 0.2 %    Comment: Performed at [facility], [place] [address] [place], [place] [address]  CBG monitoring, ED     Status: None   Collection Time: 08/06/20  7:06 AM  Result Value Ref Range   Glucose-Capillary 88 70 - 99 mg/dL    Comment: Glucose reference range applies only to samples taken after fasting for at least 8 hours.  CBG monitoring, ED     Status: None   Collection Time: 08/06/20  8:20 AM  Result Value Ref Range   Glucose-Capillary 89 70 - 99 mg/dL    Comment: Glucose reference range applies only to samples taken after fasting for at least 8 hours.   CT Chest Wo Contrast  Result Date: 08/05/2020 CLINICAL DATA:  Shortness of breath EXAM: CT CHEST WITHOUT CONTRAST TECHNIQUE: Multidetector CT imaging of the chest was performed following the standard protocol without IV contrast. COMPARISON:  04/19/2020 FINDINGS: Cardiovascular: Large pericardial effusion measuring approximately 2.1 cm in thickness. Calcific aortic atherosclerosis. Mediastinum/Nodes: Increased density within the upper mediastinum, likely lymphadenopathy. There is left greater than right axillary adenopathy. Asymmetric density within the left breast. There is also left breast skin thickening. Lungs/Pleura: Left-greater-than-right heterogeneous interstitial and airspace opacity. Peribronchial thickening. Left lower lobe bronchi are occluded. There is complete complex of the left lower lobe. Upper Abdomen: 16 mm left adrenal  mass. Musculoskeletal: Sclerotic lesions of the left second,, right third and left  seventh ribs. Numerous sclerotic lesions within the vertebral bodies, greatest at T3. IMPRESSION: 1. Left breast heterogeneous density with skin thickening is concerning for carcinoma the skin thickening could be due to edema. Correlation with mammography is recommended. 2. Large pericardial effusion, measuring up to 2.1 cm in thickness, progressed. 3. Left-greater-than-right heterogeneous interstitial and airspace opacity, concerning for pulmonary edema or infection. Left lower lobe bronchi are occluded. Reportedly the patient has a history of non-small cell lung carcinoma, but no distinct mass is visualized on this noncontrast study. Aeration of both lungs is much worse than on 04/19/2020. 4. Left greater than right axillary and mediastinal lymphadenopathy, likely metastatic. 5. Sclerotic lesions of the left second, right third and left seventh ribs, consistent with metastatic disease. 6. 16 mm left adrenal mass, concerning for metastatic disease. Aortic Atherosclerosis (ICD10-I70.0). Electronically Signed   By: Ulyses Jarred M.D.   On: 08/05/2020 20:53   DG Chest Port 1 View  Result Date: 08/05/2020 CLINICAL DATA:  Cough and shortness of breath. EXAM: PORTABLE CHEST 1 VIEW COMPARISON:  February 23, 2020 FINDINGS: Markedly enlarged and globular cardiac silhouette. Calcific atherosclerotic disease of the aorta. Probable left pleural effusion. Left lower lobe airspace disease versus atelectasis. Bilateral interstitial opacities with central predominance. Osseous structures are without acute abnormality. Soft tissues are grossly normal. IMPRESSION: 1. Markedly enlarged and globular cardiac silhouette. Cardiomyopathy versus enlarging pericardial effusion. 2. Probable left pleural effusion with left lower lobe airspace disease versus atelectasis. 3. Bilateral interstitial opacities with central predominance may represent interstitial pulmonary edema. Electronically Signed   By: Fidela Salisbury M.D.   On:  08/05/2020 17:46    Review Of Systems Constitutional: No fever, chills, chronic weight loss. Eyes: No vision change, wears glasses. No discharge or pain. Ears: No hearing loss, No tinnitus. Respiratory: No asthma, COPD, pneumonias. Positive shortness of breath. No hemoptysis. Cardiovascular: Positive chest pain, palpitation, leg edema. Gastrointestinal: No nausea, vomiting, diarrhea, constipation. No GI bleed. No hepatitis. Genitourinary: No dysuria, hematuria, kidney stone. No incontinance. Neurological: No headache, stroke, seizures.  Psychiatry: No psych facility admission for anxiety, depression, suicide. No detox. Skin: No rash. Musculoskeletal: Positive joint pain, fibromyalgia. No neck pain, back pain. Lymphadenopathy: Positive lymphadenopathy. Hematology: No anemia or easy bruising.   Blood pressure (!) 164/62, pulse 94, temperature 97.9 F (36.6 C), temperature source Oral, resp. rate 18, height 5\' 4"  (1.626 m), SpO2 98 %. Body mass index is 24.53 kg/m. General appearance: alert, cooperative, appears stated age and no distress Head: Normocephalic, atraumatic. Eyes: Brown eyes, pink conjunctiva, corneas clear.  Neck: No adenopathy, no carotid bruit, Positive JVD at b30 degree angle, supple, symmetrical, trachea midline and thyroid not enlarged. Resp: Clearing to auscultation bilaterally. Cardio: Regular rate and rhythm, S1, S2 normal, II/VI systolic murmur, no click, rub or gallop GI: Soft, non-tender; bowel sounds normal; no organomegaly. Extremities: No edema, cyanosis or clubbing. Skin: Warm and dry.  Neurologic: Alert and oriented X 3, normal strength.   Assessment/Plan Acute on chronic respiratory failure with hypoxia S/P Left lung CA with metastasis Chronic pericardial effusion S/P left thoracentesis Type 2 DM HTN Protein calorie malnutrition  Plan: Awaiting echocardiogram. Possible pericardiocentesis.  Time spent: Review of old records, Lab, x-rays, EKG,  other cardiac tests, examination, discussion with patient/Nurse/Doctor over 70 minutes.  Birdie Riddle, MD  08/06/2020, 8:40 AM

## 2020-08-06 NOTE — ED Notes (Signed)
Provided patient with toothbrush, toothpaste, soap to clean herself.

## 2020-08-07 ENCOUNTER — Inpatient Hospital Stay (HOSPITAL_COMMUNITY): Payer: Medicare Other | Admitting: Certified Registered"

## 2020-08-07 ENCOUNTER — Encounter (HOSPITAL_COMMUNITY): Payer: Self-pay | Admitting: Hematology and Oncology

## 2020-08-07 ENCOUNTER — Inpatient Hospital Stay (HOSPITAL_COMMUNITY): Payer: Medicare Other

## 2020-08-07 ENCOUNTER — Encounter (HOSPITAL_COMMUNITY)
Admission: EM | Disposition: A | Discharge status: Skilled Nursing Facility | Payer: Self-pay | Source: Home / Self Care | Attending: Internal Medicine

## 2020-08-07 DIAGNOSIS — J9601 Acute respiratory failure with hypoxia: Secondary | ICD-10-CM

## 2020-08-07 DIAGNOSIS — I469 Cardiac arrest, cause unspecified: Secondary | ICD-10-CM

## 2020-08-07 HISTORY — PX: PERICARDIOCENTESIS: CATH118255

## 2020-08-07 HISTORY — PX: ARTERIAL LINE INSERTION: CATH118227

## 2020-08-07 LAB — BODY FLUID CELL COUNT WITH DIFFERENTIAL
Lymphs, Fluid: 15 %
Monocyte-Macrophage-Serous Fluid: 2 % — ABNORMAL LOW (ref 50–90)
Neutrophil Count, Fluid: 83 % — ABNORMAL HIGH (ref 0–25)
Total Nucleated Cell Count, Fluid: 158 cu mm (ref 0–1000)

## 2020-08-07 LAB — POCT I-STAT 7, (LYTES, BLD GAS, ICA,H+H)
Acid-Base Excess: 1 mmol/L (ref 0.0–2.0)
Acid-base deficit: 1 mmol/L (ref 0.0–2.0)
Acid-base deficit: 2 mmol/L (ref 0.0–2.0)
Bicarbonate: 27.9 mmol/L (ref 20.0–28.0)
Bicarbonate: 23.8 mmol/L (ref 20.0–28.0)
Bicarbonate: 22.1 mmol/L (ref 20.0–28.0)
Calcium, Ion: 1.3 mmol/L (ref 1.15–1.40)
Calcium, Ion: 1.26 mmol/L (ref 1.15–1.40)
Calcium, Ion: 1.25 mmol/L (ref 1.15–1.40)
HCT: 34 % — ABNORMAL LOW (ref 36.0–46.0)
HCT: 32 % — ABNORMAL LOW (ref 36.0–46.0)
HCT: 33 % — ABNORMAL LOW (ref 36.0–46.0)
Hemoglobin: 11.6 g/dL — ABNORMAL LOW (ref 12.0–15.0)
Hemoglobin: 10.9 g/dL — ABNORMAL LOW (ref 12.0–15.0)
Hemoglobin: 11.2 g/dL — ABNORMAL LOW (ref 12.0–15.0)
O2 Saturation: 97 %
O2 Saturation: 99 %
O2 Saturation: 96 %
Patient temperature: 97.7
Potassium: 4.2 mmol/L (ref 3.5–5.1)
Potassium: 4.3 mmol/L (ref 3.5–5.1)
Potassium: 4.4 mmol/L (ref 3.5–5.1)
Sodium: 140 mmol/L (ref 135–145)
Sodium: 140 mmol/L (ref 135–145)
Sodium: 140 mmol/L (ref 135–145)
TCO2: 30 mmol/L (ref 22–32)
TCO2: 25 mmol/L (ref 22–32)
TCO2: 23 mmol/L (ref 22–32)
pCO2 arterial: 56.7 mmHg — ABNORMAL HIGH (ref 32.0–48.0)
pCO2 arterial: 40 mmHg (ref 32.0–48.0)
pCO2 arterial: 32.4 mmHg (ref 32.0–48.0)
pH, Arterial: 7.3 — ABNORMAL LOW (ref 7.350–7.450)
pH, Arterial: 7.382 (ref 7.350–7.450)
pH, Arterial: 7.44 (ref 7.350–7.450)
pO2, Arterial: 97 mmHg (ref 83.0–108.0)
pO2, Arterial: 162 mmHg — ABNORMAL HIGH (ref 83.0–108.0)
pO2, Arterial: 77 mmHg — ABNORMAL LOW (ref 83.0–108.0)

## 2020-08-07 LAB — POCT I-STAT, CHEM 8
BUN: 22 mg/dL (ref 8–23)
Calcium, Ion: 1.37 mmol/L (ref 1.15–1.40)
Chloride: 106 mmol/L (ref 98–111)
Creatinine, Ser: 1 mg/dL (ref 0.44–1.00)
Glucose, Bld: 170 mg/dL — ABNORMAL HIGH (ref 70–99)
HCT: 33 % — ABNORMAL LOW (ref 36.0–46.0)
Hemoglobin: 11.2 g/dL — ABNORMAL LOW (ref 12.0–15.0)
Potassium: 4.3 mmol/L (ref 3.5–5.1)
Sodium: 141 mmol/L (ref 135–145)
TCO2: 25 mmol/L (ref 22–32)

## 2020-08-07 LAB — CBC
HCT: 35.3 % — ABNORMAL LOW (ref 36.0–46.0)
Hemoglobin: 11.1 g/dL — ABNORMAL LOW (ref 12.0–15.0)
MCH: 28.8 pg (ref 26.0–34.0)
MCHC: 31.4 g/dL (ref 30.0–36.0)
MCV: 91.5 fL (ref 80.0–100.0)
Platelets: 216 K/uL (ref 150–400)
RBC: 3.86 MIL/uL — ABNORMAL LOW (ref 3.87–5.11)
RDW: 13.4 % (ref 11.5–15.5)
WBC: 4.6 K/uL (ref 4.0–10.5)
nRBC: 0 % (ref 0.0–0.2)

## 2020-08-07 LAB — BASIC METABOLIC PANEL
Anion gap: 7 (ref 5–15)
BUN: 16 mg/dL (ref 8–23)
CO2: 26 mmol/L (ref 22–32)
Calcium: 9 mg/dL (ref 8.9–10.3)
Chloride: 102 mmol/L (ref 98–111)
Creatinine, Ser: 0.85 mg/dL (ref 0.44–1.00)
GFR, Estimated: >60 mL/min (ref >60)
Glucose, Bld: 149 mg/dL — ABNORMAL HIGH (ref 70–99)
Potassium: 3.9 mmol/L (ref 3.5–5.1)
Sodium: 135 mmol/L (ref 135–145)

## 2020-08-07 LAB — GRAM STAIN

## 2020-08-07 LAB — GLUCOSE, CAPILLARY
Glucose-Capillary: 109 mg/dL — ABNORMAL HIGH (ref 70–99)
Glucose-Capillary: 117 mg/dL — ABNORMAL HIGH (ref 70–99)
Glucose-Capillary: 101 mg/dL — ABNORMAL HIGH (ref 70–99)

## 2020-08-07 LAB — MAGNESIUM: Magnesium: 2 mg/dL (ref 1.7–2.4)

## 2020-08-07 LAB — PHOSPHORUS: Phosphorus: 3.6 mg/dL (ref 2.5–4.6)

## 2020-08-07 LAB — MRSA NEXT GEN BY PCR, NASAL: MRSA by PCR Next Gen: NOT DETECTED

## 2020-08-07 SURGERY — PERICARDIOCENTESIS
Anesthesia: LOCAL

## 2020-08-07 MED ORDER — MIDAZOLAM HCL 2 MG/2ML IJ SOLN
INTRAMUSCULAR | Status: DC | PRN
  Administered 2020-08-07: 2 mg via INTRAVENOUS
  Administered 2020-08-07: 1 mg via INTRAVENOUS

## 2020-08-07 MED ORDER — PHENYLEPHRINE HCL-NACL 10-0.9 MG/250ML-% IV SOLN
25.0000 ug/min | INTRAVENOUS | Status: DC
Start: 2020-08-08 — End: 2020-08-09
  Administered 2020-08-08: 25 ug/min via INTRAVENOUS
  Administered 2020-08-08 – 2020-08-09 (×2): 10 ug/min via INTRAVENOUS
  Filled 2020-08-07 (×3): qty 250

## 2020-08-07 MED ORDER — EPINEPHRINE 1 MG/10ML IJ SOSY
PREFILLED_SYRINGE | INTRAMUSCULAR | Status: DC | PRN
  Administered 2020-08-07 (×2): 1 mg via INTRAVENOUS

## 2020-08-07 MED ORDER — HEPARIN (PORCINE) IN NACL 1000-0.9 UT/500ML-% IV SOLN
INTRAVENOUS | Status: DC | PRN
  Administered 2020-08-07: 500 mL

## 2020-08-07 MED ORDER — SODIUM CHLORIDE 0.9 % IV SOLN
INTRAVENOUS | Status: AC | PRN
  Administered 2020-08-07: 10 mL/h via INTRAVENOUS
  Administered 2020-08-07: 250 mL
  Administered 2020-08-07: 500 mL
  Administered 2020-08-07: 250 mL

## 2020-08-07 MED ORDER — FENTANYL 2500MCG IN NS 250ML (10MCG/ML) PREMIX INFUSION
INTRAVENOUS | Status: AC
  Filled 2020-08-07: qty 250

## 2020-08-07 MED ORDER — EPINEPHRINE 1 MG/10ML IJ SOSY
PREFILLED_SYRINGE | INTRAMUSCULAR | Status: AC
  Filled 2020-08-07: qty 10

## 2020-08-07 MED ORDER — SODIUM CHLORIDE 0.9 % IV SOLN
250.0000 mL | INTRAVENOUS | Status: DC
  Administered 2020-08-08: 250 mL via INTRAVENOUS

## 2020-08-07 MED ORDER — CHLORHEXIDINE GLUCONATE 0.12% ORAL RINSE (MEDLINE KIT)
15.0000 mL | Freq: Two times a day (BID) | OROMUCOSAL | Status: DC
  Administered 2020-08-07 – 2020-08-09 (×4): 15 mL via OROMUCOSAL

## 2020-08-07 MED ORDER — MIDAZOLAM 50MG/50ML (1MG/ML) PREMIX INFUSION
INTRAVENOUS | Status: AC
  Filled 2020-08-07: qty 50

## 2020-08-07 MED ORDER — FUROSEMIDE 10 MG/ML IJ SOLN
20.0000 mg | Freq: Once | INTRAMUSCULAR | Status: AC
  Administered 2020-08-07: 20 mg via INTRAVENOUS
  Filled 2020-08-07: qty 2

## 2020-08-07 MED ORDER — PANTOPRAZOLE SODIUM 40 MG IV SOLR
40.0000 mg | Freq: Two times a day (BID) | INTRAVENOUS | Status: DC
  Administered 2020-08-07 – 2020-08-08 (×3): 40 mg via INTRAVENOUS
  Filled 2020-08-07 (×3): qty 40

## 2020-08-07 MED ORDER — FENTANYL CITRATE (PF) 100 MCG/2ML IJ SOLN
INTRAMUSCULAR | Status: AC
  Filled 2020-08-07: qty 2

## 2020-08-07 MED ORDER — LIDOCAINE HCL (PF) 1 % IJ SOLN
INTRAMUSCULAR | Status: DC | PRN
  Administered 2020-08-07: 6 mL via SUBCUTANEOUS

## 2020-08-07 MED ORDER — ORAL CARE MOUTH RINSE
15.0000 mL | OROMUCOSAL | Status: DC
  Administered 2020-08-07 – 2020-08-09 (×17): 15 mL via OROMUCOSAL

## 2020-08-07 MED ORDER — DOPAMINE-DEXTROSE 3.2-5 MG/ML-% IV SOLN
INTRAVENOUS | Status: AC
  Filled 2020-08-07: qty 250

## 2020-08-07 MED ORDER — FENTANYL BOLUS VIA INFUSION
25.0000 ug | INTRAVENOUS | Status: DC | PRN
  Filled 2020-08-07: qty 100

## 2020-08-07 MED ORDER — CHLORHEXIDINE GLUCONATE CLOTH 2 % EX PADS
6.0000 | MEDICATED_PAD | Freq: Every day | CUTANEOUS | Status: DC
  Administered 2020-08-07 – 2020-08-14 (×8): 6 via TOPICAL

## 2020-08-07 MED ORDER — FENTANYL CITRATE (PF) 100 MCG/2ML IJ SOLN
INTRAMUSCULAR | Status: DC | PRN
  Administered 2020-08-07: 25 ug via INTRAVENOUS
  Administered 2020-08-07: 50 ug via INTRAVENOUS

## 2020-08-07 MED ORDER — LIDOCAINE HCL (PF) 1 % IJ SOLN
INTRAMUSCULAR | Status: AC
  Filled 2020-08-07: qty 30

## 2020-08-07 MED ORDER — SODIUM CHLORIDE 0.9 % IV SOLN: INTRAVENOUS | Status: DC

## 2020-08-07 MED ORDER — MIDAZOLAM 50MG/50ML (1MG/ML) PREMIX INFUSION
0.5000 mg/h | INTRAVENOUS | Status: DC
  Administered 2020-08-07: 2 mg/h via INTRAVENOUS
  Administered 2020-08-07: 5 mg/h via INTRAVENOUS

## 2020-08-07 MED ORDER — MIDAZOLAM HCL 2 MG/2ML IJ SOLN
INTRAMUSCULAR | Status: AC
  Filled 2020-08-07: qty 2

## 2020-08-07 MED ORDER — FUROSEMIDE 10 MG/ML IJ SOLN: 40.0000 mg | Freq: Once | INTRAMUSCULAR | Status: DC

## 2020-08-07 MED ORDER — FENTANYL 2500MCG IN NS 250ML (10MCG/ML) PREMIX INFUSION
25.0000 ug/h | INTRAVENOUS | Status: DC
  Administered 2020-08-07: 50 ug/h via INTRAVENOUS
  Administered 2020-08-07: 100 ug/h via INTRAVENOUS
  Filled 2020-08-07: qty 250

## 2020-08-07 MED ORDER — ATROPINE SULFATE 1 MG/10ML IJ SOSY
PREFILLED_SYRINGE | INTRAMUSCULAR | Status: DC | PRN
  Administered 2020-08-07: 1 mg via INTRAVENOUS

## 2020-08-07 MED ORDER — FENTANYL CITRATE (PF) 100 MCG/2ML IJ SOLN: 25.0000 ug | Freq: Once | INTRAMUSCULAR | Status: DC

## 2020-08-07 MED ORDER — ATROPINE SULFATE 1 MG/10ML IJ SOSY
PREFILLED_SYRINGE | INTRAMUSCULAR | Status: AC
  Filled 2020-08-07: qty 10

## 2020-08-07 SURGICAL SUPPLY — 3 items
DRAIN HEMOVAC 1/8 X 5 (WOUND CARE) ×2 IMPLANT
PACK CARDIAC CATHETERIZATION (CUSTOM PROCEDURE TRAY) ×2 IMPLANT
TRAY PERICARDIOCENTESIS 6FX60 (TRAY / TRAY PROCEDURE) ×2 IMPLANT

## 2020-08-07 NOTE — Progress Notes (Addendum)
HEMATOLOGY-ONCOLOGY PROGRESS NOTE  SUBJECTIVE: Sara Ferguson is followed by our office for stage IV lung cancer, adenocarcinoma.  She has been receiving treatment with Tagrisso and tolerating well.  She presented to the hospital with progressive shortness of breath, lower extremity swelling, and chest discomfort.  CT showed a large pericardial effusion, left breast skin thickening concerning for carcinoma versus edema,, left greater than right axillary mediastinal lymphadenopathy likely metastatic, sclerotic lesions in the left second, right third, and left seventh ribs consistent with metastatic disease, left adrenal mass concerning for metastatic disease.  The patient has been seen by cardiology.  For pericardiocentesis later today.  Her Newman Nip is currently on hold.  The patient is resting quietly in bed.  She reports improvement in her shortness of breath since being on oxygen.  She is not currently having any chest pain.  Still has some pedal edema.  She has no other complaints today.  Oncology History Overview Note  Chronology  75 year old female with history of hypertension, diabetes presented to the ED with hypoxia shortness of breath found to have large left-sided pleural effusion as well as pericardial effusion. Patient tells me that she had SOB for about 5 months prior to presentation and she initially thought it was bronchitis. She has also noted some weight loss of about 15 lbs and attributed this to dietary changes. Cough noted, lot of phlegm , no hemoptysis. A day before presentation to the hospital, she noticed that she could not walk to the mailbox and hence went to the hospital. 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  Repeat chest x-ray 11/15 showed persistent consolidation/effusion of the left lower lobe, and repeat thoracentesis performed 11/17 with 850 mL  fluid removal. Patient underwent CT chest showed improved aeration of the left lung with residual left infrahilar masslike density possibly reflecting the primary bronchogenic carcinoma, grossly stable left axillary confluent mediastinal and hilar adenopathy consistent with metastatic disease, left adrenal nodule suspicious for metastasis, new small dependent right pleural effusion stable pericardial effusion. CT abdomen pelvis showed no evidence of primary intra abdominal or intrapelvic malignancy. Partial visualization of heterogeneously enhancing left lower lung opacities as well as Left pleural effusion. 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 There is a 2.4 cm LEFT adrenal nodule. Findings are concerning for metastatic disease.  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 MR brain negative for malignancy.  Foundation one EGFR exon 19 deletion. Started tagrisso Dec 2nd week 2021. Imaging from March 2022 consistent with response.   Adenocarcinoma of left lung, stage 4 ([place])  01/13/2020 Initial Diagnosis   Adenocarcinoma of left lung, stage 4 ([place])    01/13/2020 Cancer Staging   Staging form: Lung, AJCC 8th Edition - Clinical stage from 01/13/2020: Stage IVB (cTX, cN3, cM1c) - Signed by [name], MD on 01/13/2020    01/30/2020 -  Chemotherapy   The patient had [No matching medication found in this treatment plan]   for chemotherapy  treatment.  REVIEW OF SYSTEMS:   Constitutional: Denies fevers, chills  Eyes: Denies blurriness of vision Ears, nose, mouth, throat, and face: Denies mucositis or sore throat Respiratory: Shortness of breath improved with oxygen Cardiovascular: Denies palpitation, chest discomfort Gastrointestinal:  Denies nausea, heartburn or change in bowel habits Skin: Denies abnormal skin rashes Lymphatics: Denies new lymphadenopathy or easy bruising Neurological:Denies numbness, tingling or new weaknesses Behavioral/Psych: Mood is stable, no new changes  Extremities: Reports some swelling in her feet All other systems were reviewed with the patient and are negative.  I have reviewed the past medical history, past surgical history, social history and family history with the patient and they are unchanged from previous note.   PHYSICAL EXAMINATION: ECOG PERFORMANCE STATUS: 2 - Symptomatic, <50% confined to bed  Vitals:   08/07/20 0427 08/07/20 0810  BP: (!) 147/53 (!) 153/62  Pulse: 87 89  Resp: 18 17  Temp: 98.8 F (37.1 C) 98.6 F (37 C)  SpO2: 96% 94%   Filed Weights   08/07/20 0427  Weight: 60.5 kg    Intake/Output from previous day: 06/20 0701 - 06/21 0700 In: 889.4 [I.V.:889.4] Out: 200 [Urine:200]  GENERAL:alert, no distress and comfortable SKIN: skin color, texture, turgor are normal, no rashes or significant lesions EYES: normal, Conjunctiva are pink and non-injected, sclera clear LUNGS: clear to auscultation and percussion with normal breathing effort HEART: regular rate & rhythm and no murmurs and trace pedal edema ABDOMEN:abdomen soft, non-tender and normal bowel sounds NEURO: alert & oriented x 3 with fluent speech, no focal motor/sensory deficits  LABORATORY DATA:  I have reviewed the data as listed CMP Latest Ref Rng & Units 08/07/2020 08/06/2020 08/05/2020  Glucose 70 - 99 mg/dL 149(H) 123(H) 98  BUN 8 - 23 mg/dL _0 Creatinine 0.44 - 1.00 mg/dL  0.85 0.82 0.68  Sodium 135 - 145 mmol/L 135 139 138  Potassium 3.5 - 5.1 mmol/L 3.9 4.3 3.9  Chloride 98 - 111 mmol/L 102 106 103  CO2 22 - 32 mmol/L _1 Calcium 8.9 - 10.3 mg/dL 9.0 8.9 9.3  Total Protein 6.5 - 8.1 g/dL - 7.4 -  Total Bilirubin 0.3 - 1.2 mg/dL - 0.7 -  Alkaline Phos 38 - 126 U/L - 85 -  AST 15 - 41 U/L - 29 -  ALT 0 - 44 U/L - 19 -    Lab Results  Component Value Date   WBC 4.6 08/07/2020   HGB 11.1 (L) 08/07/2020   HCT 35.3 (L) 08/07/2020   MCV 91.5 08/07/2020   PLT 216 08/07/2020   NEUTROABS 3.3 08/05/2020    CT Chest Wo Contrast  Result Date: 08/05/2020 CLINICAL DATA:  Shortness of breath EXAM: CT CHEST WITHOUT CONTRAST TECHNIQUE: Multidetector CT imaging of the chest was performed following the standard protocol without IV contrast. COMPARISON:  04/19/2020 FINDINGS: Cardiovascular: Large pericardial effusion measuring approximately 2.1 cm in thickness. Calcific aortic atherosclerosis. Mediastinum/Nodes: Increased density within the upper mediastinum, likely lymphadenopathy. There is left greater than right axillary adenopathy. Asymmetric density within the left breast. There is also left breast skin thickening. Lungs/Pleura: Left-greater-than-right heterogeneous interstitial and airspace opacity. Peribronchial thickening. Left lower lobe bronchi are occluded. There is complete complex of the left lower lobe. Upper Abdomen: 16 mm left adrenal mass. Musculoskeletal: Sclerotic lesions of the left second,, right third and left seventh ribs. Numerous sclerotic lesions within the vertebral bodies, greatest at T3. IMPRESSION: 1. Left breast heterogeneous density with skin thickening is concerning for  carcinoma the skin thickening could be due to edema. Correlation with mammography is recommended. 2. Large pericardial effusion, measuring up to 2.1 cm in thickness, progressed. 3. Left-greater-than-right heterogeneous interstitial and airspace opacity, concerning for  pulmonary edema or infection. Left lower lobe bronchi are occluded. Reportedly the patient has a history of non-small cell lung carcinoma, but no distinct mass is visualized on this noncontrast study. Aeration of both lungs is much worse than on 04/19/2020. 4. Left greater than right axillary and mediastinal lymphadenopathy, likely metastatic. 5. Sclerotic lesions of the left second, right third and left seventh ribs, consistent with metastatic disease. 6. 16 mm left adrenal mass, concerning for metastatic disease. Aortic Atherosclerosis (ICD10-I70.0). Electronically Signed   By: Ulyses Jarred M.D.   On: 08/05/2020 20:53   DG Chest Port 1 View  Result Date: 08/05/2020 CLINICAL DATA:  Cough and shortness of breath. EXAM: PORTABLE CHEST 1 VIEW COMPARISON:  February 23, 2020 FINDINGS: Markedly enlarged and globular cardiac silhouette. Calcific atherosclerotic disease of the aorta. Probable left pleural effusion. Left lower lobe airspace disease versus atelectasis. Bilateral interstitial opacities with central predominance. Osseous structures are without acute abnormality. Soft tissues are grossly normal. IMPRESSION: 1. Markedly enlarged and globular cardiac silhouette. Cardiomyopathy versus enlarging pericardial effusion. 2. Probable left pleural effusion with left lower lobe airspace disease versus atelectasis. 3. Bilateral interstitial opacities with central predominance may represent interstitial pulmonary edema. Electronically Signed   By: Fidela Salisbury M.D.   On: 08/05/2020 17:46   ECHOCARDIOGRAM COMPLETE  Result Date: 08/06/2020    ECHOCARDIOGRAM REPORT   Patient Name:   KELLEEN STOLZE Date of Exam: 08/06/2020 Medical Rec #:  [id number]      Height:       64.0 in Accession #:    [id number]     Weight:       142.9 lb Date of Birth:  1945/12/03      BSA:          1.696 m Patient Age:    41 years       BP:           164/72 mmHg Patient Gender: F              HR:           101 bpm. Exam Location:  Inpatient  Procedure: 2D Echo, Cardiac Doppler and Color Doppler Indications:     I31.3 Pericardial effusion  History:         Patient has prior history of Echocardiogram examinations, most                  recent 01/26/2020. Risk Factors:Hypertension and Diabetes.                  Cancer.  Sonographer:     Jonelle Sidle Dance Referring Phys:  [id number] Elmarie Shiley Diagnosing Phys: Dixie Dials MD IMPRESSIONS  1. Left ventricular ejection fraction, by estimation, is 60 to 65%. The left ventricle has normal function. The left ventricle has no regional wall motion abnormalities. Left ventricular diastolic parameters are consistent with Grade I diastolic dysfunction (impaired relaxation).  2. Right ventricular systolic function is normal. The right ventricular size is normal.  3. Moderate pericardial effusion. The pericardial effusion is circumferential. There is no evidence of cardiac tamponade.  4. The mitral valve is degenerative. Mild mitral valve regurgitation.  5. The aortic valve is tricuspid. There is mild calcification of the aortic valve. There is mild thickening of the aortic valve.  Aortic valve regurgitation is not visualized. Mild aortic valve sclerosis is present, with no evidence of aortic valve stenosis.  6. The inferior vena cava is dilated in size with <50% respiratory variability, suggesting right atrial pressure of 15 mmHg. 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 FINDINGS  Left Ventricle: Left ventricular ejection fraction, by estimation, is 60 to 65%. The left ventricle has normal function. The left ventricle has no regional wall motion abnormalities. The left ventricular internal cavity size was normal in size. There is  borderline concentric left ventricular hypertrophy. Left ventricular diastolic parameters are consistent with Grade I diastolic dysfunction (impaired relaxation). Right Ventricle: The right ventricular size is normal. No increase in right  ventricular wall thickness. Right ventricular systolic function is normal. Left Atrium: Left atrial size was normal in size. Right Atrium: Right atrial size was normal in size. Pericardium: A moderately sized pericardial effusion is present. The pericardial effusion is circumferential. The pericardial effusion appears to contain mixed echogenic material. There is no evidence of cardiac tamponade. Mitral Valve: The mitral valve is degenerative in appearance. There is mild thickening of the mitral valve leaflet(s). There is mild calcification of the mitral valve leaflet(s). Mild mitral annular calcification. Mild mitral valve regurgitation. Tricuspid Valve: The tricuspid valve is normal in structure. Tricuspid valve regurgitation is trivial. Aortic Valve: The aortic valve is tricuspid. There is mild calcification of the aortic valve. There is mild thickening of the aortic valve. Aortic valve regurgitation is not visualized. Mild aortic valve sclerosis is present, with no evidence of aortic valve stenosis. Pulmonic Valve: The pulmonic valve was normal in structure. Pulmonic valve regurgitation is not visualized. Aorta: The aortic root is normal in size and structure. There is minimal (Grade I) atheroma plaque involving the ascending aorta. Venous: The inferior vena cava is dilated in size with less than 50% respiratory variability, suggesting right atrial pressure of 15 mmHg. IAS/Shunts: The atrial septum is grossly normal.  LEFT VENTRICLE PLAX 2D LVIDd:         3.70 cm  Diastology LVIDs:         2.10 cm  LV e' medial:    4.46 cm/s LV PW:         1.20 cm  LV E/e' medial:  17.7 LV IVS:        1.10 cm  LV e' lateral:   4.68 cm/s LVOT diam:     1.90 cm  LV E/e' lateral: 16.9 LV SV:         52 LV SV Index:   31 LVOT Area:     2.84 cm  RIGHT VENTRICLE             IVC RV Basal diam:  2.50 cm     IVC diam: 2.20 cm RV S prime:     10.40 cm/s TAPSE (M-mode): 1.8 cm LEFT ATRIUM             Index       RIGHT ATRIUM           Index LA diam:        4.20 cm 2.48 cm/m  RA Area:     9.54 cm LA Vol (A2C):   66.9 ml 39.45 ml/m RA Volume:   19.60 ml 11.56 ml/m LA Vol (A4C):   25.8 ml 15.21 ml/m LA Biplane Vol: 42.6 ml 25.12 ml/m  AORTIC VALVE LVOT Vmax:   97.00 cm/s LVOT Vmean:  66.700 cm/s LVOT VTI:    0.185 m  AORTA Ao Root diam: 2.80 cm Ao Asc diam:  2.70 cm MITRAL VALVE MV Area (PHT): 5.13 cm     SHUNTS MV Decel Time: 148 msec     Systemic VTI:  0.18 m MV E velocity: 79.00 cm/s   Systemic Diam: 1.90 cm MV A velocity: 138.00 cm/s MV E/A ratio:  0.57 Dixie Dials MD Electronically signed by Dixie Dials MD Signature Date/Time: 08/06/2020/12:02:39 PM    Final     ASSESSMENT AND PLAN: Adenocarcinoma of left lung, stage 4 ([place]) This is a very pleasant 75 year old female patient with past medical history significant for hypertension, diabetes mellitus type 2 referred to hematology and medical oncology for follow-up on osimertinib for her adenocarcinoma of the lung EGFR exon 19 deletion. She was started on first-line Tagrisso and tolerated it well. CT scan performed this admission showed concern for disease progression and will discontinue Tagrisso at this time.  Scan results were discussed with the patient today. Will consider her for additional treatment when she recovers from her hospitalization.   Pericardial effusion History of malignant pericardial effusion Now with large pericardial effusion noted on CT scan For pericardiocentesis later today We will follow-up on cytology   Essential hypertension Management per hospitalist   LOS: 2 days   Mikey Bussing, DNP, AGPCNP-BC, AOCNP 08/07/20   Attending Note  I personally saw the patient, reviewed the chart and examined the patient. The plan of care was discussed with the patient and the admitting team. I agree with the assessment and plan as documented above. Thank you very much for the consultation. There appears to be concern for progression with recurrence of  pericardial effusion and concern for lymphangitic spread according to radiology. Unfortunately this is a bit uncommon with being on tagrisso for short duration. We will try to repeat molecular testing on blood or pericardial fluid. If no other targeted therapy and overt concern for progression, will have to consider chemotherapy. Would consider repeating MRI brain and CT abdomen pelvis to evaluate consider disease status. I discussed this with patient briefly, but appears a bit sleepy, so not sure if she understood. Discussed it briefly with her niece as well. Ok to hold tagrisso for now, will see her outpatient and discuss future treatment plans.

## 2020-08-07 NOTE — Interval H&P Note (Signed)
History and Physical Interval Note:  08/07/2020 3:12 PM  Sara Ferguson  has presented today for surgery, with the diagnosis of pericardiocentesis.  The various methods of treatment have been discussed with the patient and family. After consideration of risks, benefits and other options for treatment, the patient has consented to  Procedure(s): PERICARDIOCENTESIS (N/A) as a surgical intervention.  The patient's history has been reviewed, patient examined, no change in status, stable for surgery.  I have reviewed the patient's chart and labs.  Questions were answered to the patient's satisfaction.     Birdie Riddle

## 2020-08-07 NOTE — Progress Notes (Signed)
[facility] Progress Note Patient Name: Trezure Cronk DOB: 25-Dec-1945 MRN: [id number]   Date of Service  08/07/2020  HPI/Events of Note  Hypotension - SBP = 80's. No CVL or CVP.   eICU Interventions  Plan: Phenylephrine IV infusion via PIV. Titrate to MAP > 65.     Intervention Category Major Interventions: Hypotension - evaluation and management  Lysle Dingwall 08/07/2020, 11:58 PM

## 2020-08-07 NOTE — Progress Notes (Signed)
PROGRESS NOTE    Sara Ferguson  RKY:[id number] DOB: Nov 08, 1945 DOA: 08/05/2020 PCP: [name], MD    Brief Narrative: This 75 years old female with PMH significant for non-small cell lung cancer stage IV, recurrent pleural effusions, diabetes, essential hypertension presents in the ED complaining of progressive shortness of breath, lower extremity swelling and chest discomfort.  Patient does have a history of malignant pleural effusion. Patient currently on oral chemotherapy. She was found to be hypoxic 89% on room air,  remains on 2 L of supplemental oxygen.  CT chest showed large pericardial effusion,  left breast density possible carcinoma,  multiple lymphadenopathy.  Patient is transferred from [facility] to [facility] for pericardiocentesis.  Cardiology is following.  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 CT chest left greater than right heterogeneous interstitial and airspace opacity concerning for pulmonary edema or infection Hold IV fluids. Echocardiogram shows moderate pericardial effusion. Cardiology consulted Dr. [name] has seen the patient. Continue colchicine and NSAIDs. Patient is scheduled to have pericardiocentesis today. Continue supplemental oxygen to maintain saturation above 94%.  Hypertension: Hold blood pressure medications in the setting of pericardial effusion.  Diabetes mellitus: Continue regular insulin sliding scale  Adenocarcinoma left lung stage IV: Dr.  [name] aware of admission. Plan to hold Tagrisso while patient is on Colchicine.    Left breast heterogeneous density  with a skin thickening concerning for carcinoma: Obtain mammogram.  DVT prophylaxis: SCDs Code Status:  Full code. Family Communication: No family at bed side. Disposition Plan:   Status is: Inpatient  Remains inpatient appropriate because:Inpatient level of care appropriate due to severity of illness  Dispo: The patient is from: Home              Anticipated d/c is to: Home              Patient currently is not medically stable to d/c.   Difficult to place patient No  Consultants:  Cardiology  Procedures:  Pericardiocentesis. Antimicrobials:   Anti-infectives (From admission, onward)    None        Subjective: Patient was seen and examined at bedside.  Overnight events noted.  Patient reports feeling improved. she still requiring 2 L of supplemental oxygen sats 94%.  Patient is scheduled to have pericardiocentesis today.  Objective: Vitals:   08/06/20 2333 08/07/20 0427 08/07/20 0810 08/07/20 1204  BP: (!) 154/66 (!) 147/53 (!) 153/62 (!) 174/74  Pulse: 87 87 89 82  Resp: 18 18 17 19   Temp: 98.6 F (37 C) 98.8 F (37.1 C) 98.6 F (37 C) 98.5 F (36.9 C)  TempSrc: Oral Oral Oral Oral  SpO2: 95% 96% 94% 100%  Weight:  60.5 kg    Height:        Intake/Output Summary (Last 24 hours) at 08/07/2020 1233 Last data filed at 08/07/2020 1020 Gross per 24 hour  Intake 477 ml  Output 200 ml  Net 277 ml   Filed Weights   08/07/20 0427  Weight: 60.5 kg    Examination:  General exam: Appears calm and comfortable, not in any acute distress Respiratory system: Clear to auscultation. Respiratory effort normal. Cardiovascular system: S1 &  S2 heard, RRR. No JVD, murmurs, rubs, gallops or clicks. No pedal edema. Gastrointestinal system: Abdomen is nondistended, soft and nontender. No organomegaly or masses felt. Normal bowel sounds heard. Central nervous system: Alert and oriented. No focal neurological deficits. Extremities: 1+ pitting edema, no cyanosis, no  clubbing. Skin: No rashes, lesions or ulcers Psychiatry: Judgement and insight appear normal. Mood & affect appropriate.     Data Reviewed: I have personally reviewed following labs and imaging studies  CBC: Recent Labs  Lab 08/05/20 2000 08/06/20 0330 08/07/20 0246  WBC 4.6 5.7 4.6  NEUTROABS 3.3  --   --   HGB 13.3 11.6* 11.1*  HCT 40.7 36.2 35.3*  MCV 91.1 92.1 91.5  PLT 231 245 161   Basic Metabolic Panel: Recent Labs  Lab 08/05/20 2000 08/06/20 0330 08/07/20 0246  NA 138 139 135  K 3.9 4.3 3.9  CL 103 106 102  CO2 27 27 26   GLUCOSE 98 123* 149*  BUN 14 16 16   CREATININE 0.68 0.82 0.85  CALCIUM 9.3 8.9 9.0  MG  --   --  2.0  PHOS  --   --  3.6   GFR: Estimated Creatinine Clearance: 47.9 mL/min (by C-G formula based on SCr of 0.85 mg/dL). Liver Function Tests: Recent Labs  Lab 08/06/20 0330  AST 29  ALT 19  ALKPHOS 85  BILITOT 0.7  PROT 7.4  ALBUMIN 3.2*   No results for input(s): LIPASE, AMYLASE in the last 168 hours. No results for input(s): AMMONIA in the last 168 hours. Coagulation Profile: No results for input(s): INR, PROTIME in the last 168 hours. Cardiac Enzymes: No results for input(s): CKTOTAL, CKMB, CKMBINDEX, TROPONINI in the last 168 hours. BNP (last 3 results) No results for input(s): PROBNP in the last 8760 hours. HbA1C: Recent Labs    08/06/20 0330  HGBA1C 6.3*   CBG: Recent Labs  Lab 08/06/20 1159 08/06/20 1604 08/06/20 2111 08/07/20 0547 08/07/20 1202  GLUCAP 144* 77 147* 109* 117*   Lipid Profile: No results for input(s): CHOL, HDL, LDLCALC, TRIG, CHOLHDL, LDLDIRECT in the last 72 hours. Thyroid Function Tests: No results for input(s): TSH, T4TOTAL, FREET4, T3FREE, THYROIDAB in the last 72 hours. Anemia Panel: No results for input(s): VITAMINB12, FOLATE, FERRITIN, TIBC, IRON, RETICCTPCT in the last 72 hours. Sepsis Labs: Recent Labs  Lab 08/05/20 2000  LATICACIDVEN 1.3    Recent Results (from the past 240  hour(s))  Resp Panel by RT-PCR (Flu A&B, Covid) Nasopharyngeal Swab     Status: None   Collection Time: 08/05/20  6:30 PM   Specimen: Nasopharyngeal Swab; Nasopharyngeal(NP) swabs in vial transport medium  Result Value Ref Range Status   SARS Coronavirus 2 by RT PCR NEGATIVE NEGATIVE Final    Comment: (NOTE) SARS-CoV-2 target nucleic acids are NOT DETECTED.  The SARS-CoV-2 RNA is generally detectable in upper respiratory specimens during the acute phase of infection. The lowest concentration of SARS-CoV-2 viral copies this assay can detect is 138 copies/mL. A negative result does not preclude SARS-Cov-2 infection and should not be used as the sole basis for treatment or other patient management decisions. A negative result may occur with  improper specimen collection/handling, submission of specimen other than nasopharyngeal swab, presence of viral mutation(s) within the areas targeted by this assay, and inadequate number of viral copies(<138 copies/mL). A negative result must be combined with clinical observations, patient history, and epidemiological information. The expected result is Negative.  Fact Sheet for Patients:  EntrepreneurPulse.com.au  Fact  Sheet for Healthcare Providers:  IncredibleEmployment.be  This test is no t yet approved or cleared by the [place] FDA and  has been authorized for detection and/or diagnosis of SARS-CoV-2 by FDA under an Emergency Use Authorization (EUA). This EUA will remain  in effect (meaning this test can be used) for the duration of the COVID-19 declaration under Section 564(b)(1) of the Act, 21 U.S.C.section 360bbb-3(b)(1), unless the authorization is terminated  or revoked sooner.       Influenza A by PCR NEGATIVE NEGATIVE Final   Influenza B by PCR NEGATIVE NEGATIVE Final    Comment: (NOTE) The Xpert Xpress SARS-CoV-2/FLU/RSV plus assay is intended as an aid in the diagnosis of influenza from  Nasopharyngeal swab specimens and should not be used as a sole basis for treatment. Nasal washings and aspirates are unacceptable for Xpert Xpress SARS-CoV-2/FLU/RSV testing.  Fact Sheet for Patients: EntrepreneurPulse.com.au  Fact Sheet for Healthcare Providers: IncredibleEmployment.be  This test is not yet approved or cleared by the [place] FDA and has been authorized for detection and/or diagnosis of SARS-CoV-2 by FDA under an Emergency Use Authorization (EUA). This EUA will remain in effect (meaning this test can be used) for the duration of the COVID-19 declaration under Section 564(b)(1) of the Act, 21 U.S.C. section 360bbb-3(b)(1), unless the authorization is terminated or revoked.  Performed at [facility], [place] [address] [place], [place] [address]   Culture, blood (routine x 2)     Status: None (Preliminary result)   Collection Time: 08/05/20  8:18 PM   Specimen: BLOOD  Result Value Ref Range Status   Specimen Description   Final    BLOOD RIGHT ARM Performed at [place] [address], [place], [place] [address]    Special Requests   Final    BOTTLES DRAWN AEROBIC AND ANAEROBIC Blood Culture results may not be optimal due to an excessive volume of blood received in culture bottles Performed at [place] [address] [place], [place] [address]    Culture   Final    NO GROWTH 1 DAY Performed at [facility], [place] [address] [place], [place] [address]    Report Status PENDING  Incomplete  Culture, blood (routine x 2)     Status: None (Preliminary result)   Collection Time: 08/05/20  9:00 PM   Specimen: BLOOD  Result Value Ref Range Status   Specimen Description   Final    BLOOD BLOOD RIGHT HAND Performed at [place] [address], [place], [place] [address]    Special Requests   Final    BOTTLES DRAWN AEROBIC AND ANAEROBIC  Blood Culture adequate volume Performed at [facility] [address] [place], [place] [address]    Culture   Final    NO GROWTH 1 DAY Performed at [facility], [place] [address] [place], [place] [address]    Report Status PENDING  Incomplete    Radiology Studies: CT Chest Wo Contrast  Result Date: 08/05/2020 CLINICAL DATA:  Shortness of breath EXAM: CT CHEST WITHOUT CONTRAST TECHNIQUE: Multidetector CT imaging of the chest was performed following the standard protocol without IV contrast. COMPARISON:  04/19/2020 FINDINGS: Cardiovascular: Large pericardial effusion measuring approximately 2.1 cm in thickness. Calcific aortic atherosclerosis. Mediastinum/Nodes: Increased density within the upper mediastinum, likely lymphadenopathy. There is left greater than right axillary adenopathy. Asymmetric density within the left breast. There is also left breast skin thickening. Lungs/Pleura:  Left-greater-than-right heterogeneous interstitial and airspace opacity. Peribronchial thickening. Left lower lobe bronchi are occluded. There is complete complex of the left lower lobe. Upper Abdomen: 16 mm left adrenal mass. Musculoskeletal: Sclerotic lesions of the left second,, right third and left seventh ribs. Numerous sclerotic lesions within the vertebral bodies, greatest at T3. IMPRESSION: 1. Left breast heterogeneous density with skin thickening is concerning for carcinoma the skin thickening could be due to edema. Correlation with mammography is recommended. 2. Large pericardial effusion, measuring up to 2.1 cm in thickness, progressed. 3. Left-greater-than-right heterogeneous interstitial and airspace opacity, concerning for pulmonary edema or infection. Left lower lobe bronchi are occluded. Reportedly the patient has a history of non-small cell lung carcinoma, but no distinct mass is visualized on this noncontrast study. Aeration of both lungs is much worse than on 04/19/2020. 4. Left  greater than right axillary and mediastinal lymphadenopathy, likely metastatic. 5. Sclerotic lesions of the left second, right third and left seventh ribs, consistent with metastatic disease. 6. 16 mm left adrenal mass, concerning for metastatic disease. Aortic Atherosclerosis (ICD10-I70.0). Electronically Signed   By: Ulyses Jarred M.D.   On: 08/05/2020 20:53   DG Chest Port 1 View  Result Date: 08/05/2020 CLINICAL DATA:  Cough and shortness of breath. EXAM: PORTABLE CHEST 1 VIEW COMPARISON:  February 23, 2020 FINDINGS: Markedly enlarged and globular cardiac silhouette. Calcific atherosclerotic disease of the aorta. Probable left pleural effusion. Left lower lobe airspace disease versus atelectasis. Bilateral interstitial opacities with central predominance. Osseous structures are without acute abnormality. Soft tissues are grossly normal. IMPRESSION: 1. Markedly enlarged and globular cardiac silhouette. Cardiomyopathy versus enlarging pericardial effusion. 2. Probable left pleural effusion with left lower lobe airspace disease versus atelectasis. 3. Bilateral interstitial opacities with central predominance may represent interstitial pulmonary edema. Electronically Signed   By: Fidela Salisbury M.D.   On: 08/05/2020 17:46   ECHOCARDIOGRAM COMPLETE  Result Date: 08/06/2020    ECHOCARDIOGRAM REPORT   Patient Name:   ESTIE SPROULE Date of Exam: 08/06/2020 Medical Rec #:  [id number]      Height:       64.0 in Accession #:    [id number]     Weight:       142.9 lb Date of Birth:  [birth year]      BSA:          1.696 m Patient Age:    36 years       BP:           164/72 mmHg Patient Gender: F              HR:           101 bpm. Exam Location:  Inpatient Procedure: 2D Echo, Cardiac Doppler and Color Doppler Indications:     I31.3 Pericardial effusion  History:         Patient has prior history of Echocardiogram examinations, most                  recent 01/26/2020. Risk Factors:Hypertension and Diabetes.                   Cancer.  Sonographer:     Jonelle Sidle Dance Referring Phys:  [id number] Elmarie Shiley Diagnosing Phys: Dixie Dials MD IMPRESSIONS  1. Left ventricular ejection fraction, by estimation, is 60 to 65%. The left ventricle has normal function. The left ventricle has no regional wall motion abnormalities. Left ventricular diastolic parameters are consistent with Grade I diastolic dysfunction (impaired relaxation).  2. Right ventricular systolic function is normal. The right ventricular size is normal.  3. Moderate pericardial effusion. The pericardial effusion is circumferential. There is no evidence of cardiac tamponade.  4. The mitral valve is degenerative. Mild mitral valve regurgitation.  5. The aortic valve is tricuspid. There is mild calcification of the aortic valve. There is mild thickening of the aortic valve. Aortic valve regurgitation is not visualized. Mild aortic valve sclerosis is present, with no evidence of aortic valve stenosis.  6. The inferior vena cava is dilated in size with <50% respiratory variability, suggesting right atrial pressure of 15 mmHg. 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 FINDINGS  Left Ventricle: Left ventricular ejection fraction, by estimation, is 60 to 65%. The left ventricle has normal function. The left ventricle has no regional wall motion abnormalities. The left ventricular internal cavity size was normal in size. There is  borderline concentric left ventricular hypertrophy. Left ventricular diastolic parameters are consistent with Grade I diastolic dysfunction (impaired relaxation). Right Ventricle: The right ventricular size is normal. No increase in right ventricular wall thickness. Right ventricular systolic function is normal. Left Atrium: Left atrial size was normal in size. Right Atrium: Right atrial size was normal in size. Pericardium: A moderately sized pericardial effusion is present. The pericardial effusion is  circumferential. The pericardial effusion appears to contain mixed echogenic material. There is no evidence of cardiac tamponade. Mitral Valve: The mitral valve is degenerative in appearance. There is mild thickening of the mitral valve leaflet(s). There is mild calcification of the mitral valve leaflet(s). Mild mitral annular calcification. Mild mitral valve regurgitation. Tricuspid Valve: The tricuspid valve is normal in structure. Tricuspid valve regurgitation is trivial. Aortic Valve: The aortic valve is tricuspid. There is mild calcification of the aortic valve. There is mild thickening of the aortic valve. Aortic valve regurgitation is not visualized. Mild aortic valve sclerosis is present, with no evidence of aortic valve stenosis. Pulmonic Valve: The pulmonic valve was normal in structure. Pulmonic valve regurgitation is not visualized. Aorta: The aortic root is normal in size and structure. There is minimal (Grade I) atheroma plaque involving the ascending aorta. Venous: The inferior vena cava is dilated in size with less than 50% respiratory variability, suggesting right atrial pressure of 15 mmHg. IAS/Shunts: The atrial septum is grossly normal.  LEFT VENTRICLE PLAX 2D LVIDd:         3.70 cm  Diastology LVIDs:         2.10 cm  LV e' medial:    4.46 cm/s LV PW:         1.20 cm  LV E/e' medial:  17.7 LV IVS:        1.10 cm  LV e' lateral:   4.68 cm/s LVOT diam:     1.90 cm  LV E/e' lateral: 16.9 LV SV:         52 LV SV Index:   31 LVOT Area:     2.84 cm  RIGHT VENTRICLE             IVC RV Basal diam:  2.50 cm     IVC diam: 2.20 cm RV S prime:     10.40 cm/s TAPSE (M-mode): 1.8 cm LEFT ATRIUM             Index       RIGHT ATRIUM          Index LA diam:        4.20 cm 2.48 cm/m  RA Area:     9.54 cm LA Vol (A2C):   66.9 ml 39.45 ml/m RA Volume:   19.60 ml 11.56 ml/m LA Vol (A4C):   25.8 ml 15.21 ml/m LA Biplane Vol: 42.6 ml 25.12 ml/m  AORTIC VALVE LVOT Vmax:   97.00 cm/s LVOT Vmean:  66.700 cm/s LVOT  VTI:    0.185 m  AORTA Ao Root diam: 2.80 cm Ao Asc diam:  2.70 cm MITRAL VALVE MV Area (PHT): 5.13 cm     SHUNTS MV Decel Time: 148 msec     Systemic VTI:  0.18 m MV E velocity: 79.00 cm/s   Systemic Diam: 1.90 cm MV A velocity: 138.00 cm/s MV E/A ratio:  0.57 Dixie Dials MD Electronically signed by Dixie Dials MD Signature Date/Time: 08/06/2020/12:02:39 PM    Final      Scheduled Meds:  colchicine  0.6 mg Oral BID   insulin aspart  0-5 Units Subcutaneous QHS   insulin aspart  0-9 Units Subcutaneous TID WC   metoprolol tartrate  25 mg Oral BID   pantoprazole  40 mg Oral BID   potassium chloride SA  20 mEq Oral Daily   Continuous Infusions:   LOS: 2 days    Time spent: 35 mins    Jaleeah Slight, MD [facility]   If 7PM-7AM, please contact night-coverage

## 2020-08-07 NOTE — Anesthesia Procedure Notes (Signed)
Procedure Name: Intubation Date/Time: 08/07/2020 3:31 PM Performed by: Babs Bertin, CRNA Pre-anesthesia Checklist: Patient identified, Emergency Drugs available, Suction available, Patient being monitored and Timeout performed Oxygen Delivery Method: Ambu bag Preoxygenation: Pre-oxygenation with 100% oxygen Laryngoscope Size: Glidescope and 3 Grade View: Grade I Tube type: Subglottic suction tube Tube size: 7.5 mm Number of attempts: 1 Airway Equipment and Method: Stylet and Video-laryngoscopy Placement Confirmation: ETT inserted through vocal cords under direct vision, breath sounds checked- equal and bilateral and CO2 detector Secured at: 21 cm Tube secured with: Tape Dental Injury: Teeth and Oropharynx as per pre-operative assessment

## 2020-08-07 NOTE — Consult Note (Signed)
NAME:  Sara Ferguson MRN:  [id number] DOB:  05/21/1945 LOS: 2 ADMISSION DATE:  08/05/2020 CONSULTATION DATE:  08/07/2020 REFERRING MD:  Shawna Clamp CHIEF COMPLAINT:  PEA arrest, mechanical ventilation   History of Present Illness:  75 year old woman with PMHx significant for HTN, T2DM and stage IV NSCLC (c/b recurrent malignant pleural effusion, on oral chemotherapy) who presented to [facility] 6/19 for SOB, increased LE edema and chest discomfort. CT Chest completed demonstrating large pericardial effusion. Transferred to [facility] for pericardiocentesis.  Patient underwent pericardiocentesis 6/21; during the procedure, patient experienced vagal inhibition on advancement of the wire and drainage tube and went into PEA arrest requiring 3-4 minutes CPR, Epi x 1 and atropine x 1 with ROSC. She remained intubated post-procedure in the setting of arrest and was transferred to the CVICU.  PCCM consulted for ventilator management s/p PEA arrest.  Pertinent Medical History:   Past Medical History:  Diagnosis Date   Arthritis    Diabetes mellitus without complication ([place])    Hypertension    Non-small cell lung cancer ([place])    Dx 12/30/19 by thoracentesis    Significant Hospital Events: Including procedures, antibiotic start and stop dates in addition to other pertinent events   6/21 - Underwent pericardiocentesis for moderate pericardial effusion; vagal inhibition with PEA arrest (3-4 min CPR, Epi x 1 and Atropine x 1) with ROSC, remained intubated post-arrest. PCCM consulted.  Interim History / Subjective:  N/A  Objective:  Blood pressure (!) 174/74, pulse (!) 163, temperature 98.5 F (36.9 C), temperature source Oral, resp. rate 17, height 5\' 4"  (1.626 m), weight 60.5 kg, SpO2 97 %.    Vent Mode: PRVC FiO2 (%):  [100 %] 100 % Set Rate:  [16 bmp] 16 bmp Vt Set:  [460 mL] 460 mL PEEP:  [5 cmH20] 5 cmH20 Plateau Pressure:  [27 cmH20] 27 cmH20   Intake/Output Summary (Last 24 hours) at 08/07/2020  1805 Last data filed at 08/07/2020 1020 Gross per 24 hour  Intake 477 ml  Output 200 ml  Net 277 ml   Filed Weights   08/07/20 0427  Weight: 60.5 kg   Physical Examination: General: Chronically ill-appearing elderly woman in NAD. HEENT: [place]/AT, anicteric sclera, PERRL, moist mucous membranes. ETT in place with tongue protruding. Neuro: Sedated. Responds to noxious stimuli. and Withdraws to pain in all 4 extremities. Not following commands. Moves all 4 extremities spontaneously. +Cough and +Gag  CV: RRR, no m/g/r. PULM: Breathing even and unlabored on vent (PEEP 8, FiO2 60%). Lung fields CTAB, diminished at bilateral bases. Chest: Pericardial drainage tube in place to Hemovac, sanguineous drainage. GI: Soft, nontender, nondistended. Normoactive bowel sounds. Extremities: Trace bilateral symmetric LE edema noted. Skin: Warm/dry, no rashes.  Labs/imaging that I have personally reviewed: (right click and "Reselect all SmartList Selections" daily)  6/21AM Labs:  WBC 4.6, H&H 11.1/35.3, Plt 216  Na 135, K 3.9, Cl 102, CO2 26, BUN 16, Cr 0.85 Ca 9.0, Phos 3.6, Mg 2.0  Glucoses 77-147 last 24H  6/21 1640: Na 141, K 4.3, Cl 106, BUN 22, Cr 1.0, Glucose 170 iCal 1.37  CT Chest without contrast 6/19: IMPRESSION: 1. Left breast heterogeneous density with skin thickening is concerning for carcinoma the skin thickening could be due to edema. Correlation with mammography is recommended. 2. Large pericardial effusion, measuring up to 2.1 cm in thickness, progressed. 3. Left-greater-than-right heterogeneous interstitial and airspace opacity, concerning for pulmonary edema or infection. Left lower lobe bronchi are occluded. Reportedly the patient has a history  of non-small cell lung carcinoma, but no distinct mass is visualized on this noncontrast study. Aeration of both lungs is much worse than on 04/19/2020. 4. Left greater than right axillary and mediastinal lymphadenopathy, likely  metastatic. 5. Sclerotic lesions of the left second, right third and left seventh ribs, consistent with metastatic disease. 6. 16 mm left adrenal mass, concerning for metastatic disease.  CXR 6/21 pending  Resolved Hospital Problem List:     Assessment & Plan:  Acute hypoxemic respiratory failure in the setting of cardiac arrest Patient initially presented to [facility] for SOB, LE edema and chest discomfort. CT Chest demonstrated large pericardial effusion and patient was subsequently transferred to [facility] for pericardiocentesis. Pericardiocentesis c/b vagal inhibition leading to PEA arrest. Required CPR x 3-4 minutes, Epi x 1 and atropine x 1. Remained intubated post-procedure/post-arrest, prompting PCCM consult. - Continue full vent support (4-8cc/kg IBW) - Wean FiO2 for O2 sat > 90% - Daily WUA/SBT - VAP bundle - Pulmonary hygiene - PAD protocol for sedation: Fentanyl and Versed for goal RASS 0 to -1 - F/u ABG 2000 - F/u CXR - Suspect patient will be reasonable to consider extubation in the next 12-24 hours; strong cough/gag present and intubated primarily for procedure; wean sedation as tolerated for better assessment of neurologic status prior to extubation consideration - was apneic at bedside evaluation due to level of sedation at time of exam.  Brief PEA arrest in the setting of pericardiocentesis Pericardiocentesis c/b vagal inhibition leading to PEA arrest. Required CPR x 3-4 minutes, Epi x 1 and atropine x 1. - Remains intubated post-procedure/post-arrest (see above) - A-line placed during procedure - Goal MAP > 65, no need for pressors at this juncture - No indication for TTM at present, given short down time  Best Practice (right click and "Reselect all SmartList Selections" daily)   Diet/type: NPO Pain/Anxiety/Delirium protocol RASS goal: 0 to -1 VAP protocol (if indicated): Yes DVT prophylaxis: SCD GI prophylaxis: PPI Glucose control:  SSI Central venous access:   N/A Arterial line:  Yes, and it is still needed Foley:  N/A Mobility:  bed rest  PT consulted: N/A Studies pending: XRAY Culture data pending:none Last reviewed culture data:today Antibiotics:not indicated  Antibiotic de-escalation: N/A Stop date: N/A Daily labs:  Per Primary Code Status:  full code Last date of multidisciplinary goals of care discussion [Per Primary Team] Disposition: remains critically ill, will stay in intensive care  Labs:   CBC: Recent Labs  Lab 08/05/20 2000 08/06/20 0330 08/07/20 0246 08/07/20 1639 08/07/20 1640  WBC 4.6 5.7 4.6  --   --   NEUTROABS 3.3  --   --   --   --   HGB 13.3 11.6* 11.1* 11.6* 11.2*  HCT 40.7 36.2 35.3* 34.0* 33.0*  MCV 91.1 92.1 91.5  --   --   PLT 231 245 216  --   --    Basic Metabolic Panel: Recent Labs  Lab 08/05/20 2000 08/06/20 0330 08/07/20 0246 08/07/20 1639 08/07/20 1640  NA 138 139 135 140 141  K 3.9 4.3 3.9 4.2 4.3  CL 103 106 102  --  106  CO2 27 27 26   --   --   GLUCOSE 98 123* 149*  --  170*  BUN 14 16 16   --  22  CREATININE 0.68 0.82 0.85  --  1.00  CALCIUM 9.3 8.9 9.0  --   --   MG  --   --  2.0  --   --  PHOS  --   --  3.6  --   --    GFR: Estimated Creatinine Clearance: 40.7 mL/min (by C-G formula based on SCr of 1 mg/dL). Recent Labs  Lab 08/05/20 2000 08/06/20 0330 08/07/20 0246  WBC 4.6 5.7 4.6  LATICACIDVEN 1.3  --   --    Liver Function Tests: Recent Labs  Lab 08/06/20 0330  AST 29  ALT 19  ALKPHOS 85  BILITOT 0.7  PROT 7.4  ALBUMIN 3.2*   No results for input(s): LIPASE, AMYLASE in the last 168 hours. No results for input(s): AMMONIA in the last 168 hours.  ABG:    Component Value Date/Time   PHART 7.300 (L) 08/07/2020 1639   PCO2ART 56.7 (H) 08/07/2020 1639   PO2ART 97 08/07/2020 1639   HCO3 27.9 08/07/2020 1639   TCO2 25 08/07/2020 1640   O2SAT 97.0 08/07/2020 1639    Coagulation Profile: No results for input(s): INR, PROTIME in the last 168  hours.  Cardiac Enzymes: No results for input(s): CKTOTAL, CKMB, CKMBINDEX, TROPONINI in the last 168 hours.  HbA1C: Hgb A1c MFr Bld  Date/Time Value Ref Range Status  08/06/2020 03:30 AM 6.3 (H) 4.8 - 5.6 % Final    Comment:    (NOTE)         Prediabetes: 5.7 - 6.4         Diabetes: >6.4         Glycemic control for adults with diabetes: <7.0   12/30/2019 11:18 AM 10.7 (H) 4.8 - 5.6 % Final    Comment:    (NOTE) Pre diabetes:          5.7%-6.4%  Diabetes:              >6.4%  Glycemic control for   <7.0% adults with diabetes    CBG: Recent Labs  Lab 08/06/20 1159 08/06/20 1604 08/06/20 2111 08/07/20 0547 08/07/20 1202  GLUCAP 144* 77 147* 109* 117*   Review of Systems:   Patient is encephalopathic and/or intubated. Therefore history has been obtained from chart review.   Past Medical History:  She,  has a past medical history of Arthritis, Diabetes mellitus without complication ([place]), Hypertension, and Non-small cell lung cancer ([place]).   Surgical History:   Past Surgical History:  Procedure Laterality Date   ABDOMINAL HYSTERECTOMY     ECTOPIC PREGNANCY SURGERY      Social History:   reports that she has never smoked. She has never used smokeless tobacco. She reports previous alcohol use. She reports that she does not use drugs.   Family History:  Her family history is not on file.   Allergies No Known Allergies   Home Medications  Prior to Admission medications   Medication Sig Start Date End Date Taking? Authorizing Provider  albuterol (VENTOLIN HFA) 108 (90 Base) MCG/ACT inhaler Inhale 2 puffs into the lungs every 6 (six) hours as needed for wheezing or shortness of breath. 01/05/20 08/05/20 Yes Kc, Maren Beach, MD  amLODipine (NORVASC) 5 MG tablet TAKE 1 TABLET (5 MG TOTAL) BY MOUTH DAILY. 05/21/20 08/05/20 Yes [name], MD  colchicine 0.6 MG tablet Take 1 tablet (0.6 mg total) by mouth 2 (two) times daily. 01/28/20  Yes Oretha Milch D, MD   furosemide (LASIX) 20 MG tablet Take 1 tablet (20 mg total) by mouth daily. 03/22/20  Yes [name], MD  guaifenesin (ROBITUSSIN) 100 MG/5ML syrup Take 200 mg by mouth 3 (three) times daily as needed for cough.  Yes [provider]  hydrALAZINE (APRESOLINE) 25 MG tablet Take 25 mg by mouth 2 (two) times daily. 05/08/20  Yes [provider]  hydrocortisone cream 1 % Apply 1 application topically daily as needed for itching (rash).   Yes [provider]  metoprolol tartrate (LOPRESSOR) 50 MG tablet Take 25 mg by mouth 2 (two) times daily. 05/08/20  Yes [provider]  osimertinib mesylate (TAGRISSO) 80 MG tablet Take 1 tablet (80 mg total) by mouth daily. Days 1-28. 01/30/20  Yes [name], MD  potassium chloride (KLOR-CON) 10 MEQ tablet TAKE 2 TABLETS BY MOUTH DAILY Patient taking differently: Take 20 mEq by mouth daily. 05/25/20  Yes [name], MD  tetrahydrozoline 0.05 % ophthalmic solution Place 1 drop into both eyes daily as needed (dry, itchy eyes). Patient not taking: No sig reported    [provider]    Critical care time: 40 minutes   Lestine Mount, PA-C [facility] 08/07/20 6:05 PM  Please see [email or web address] for pager details.  From 7A-7P if no response, please call [phone] After hours, please call E-Link [phone]

## 2020-08-07 NOTE — Progress Notes (Signed)
[place] Progress Note Patient Name: Sara Ferguson DOB: 06/17/1945 MRN: [id number]   Date of Service  08/07/2020  HPI/Events of Note  Pulmonary Edema - CXR reveals significant interval progression of bilateral pulmonary opacities likely representing worsening edema/CHF or pneumonia. BP = 102/43.  eICU Interventions  Plan: Will not extubate as requested.  Lasix 20 mg IV X 1 now.      Intervention Category Major Interventions: Hypoxemia - evaluation and management  Lysle Dingwall 08/07/2020, 9:34 PM

## 2020-08-08 ENCOUNTER — Inpatient Hospital Stay (HOSPITAL_COMMUNITY): Payer: Medicare Other

## 2020-08-08 ENCOUNTER — Encounter: Payer: Self-pay | Admitting: *Deleted

## 2020-08-08 ENCOUNTER — Encounter (HOSPITAL_COMMUNITY): Payer: Self-pay | Admitting: Cardiovascular Disease

## 2020-08-08 DIAGNOSIS — C3492 Malignant neoplasm of unspecified part of left bronchus or lung: Secondary | ICD-10-CM

## 2020-08-08 DIAGNOSIS — I309 Acute pericarditis, unspecified: Secondary | ICD-10-CM

## 2020-08-08 LAB — CBC
HCT: 32.2 % — ABNORMAL LOW (ref 36.0–46.0)
Hemoglobin: 10.4 g/dL — ABNORMAL LOW (ref 12.0–15.0)
MCH: 29.3 pg (ref 26.0–34.0)
MCHC: 32.3 g/dL (ref 30.0–36.0)
MCV: 90.7 fL (ref 80.0–100.0)
Platelets: 211 10*3/uL (ref 150–400)
RBC: 3.55 MIL/uL — ABNORMAL LOW (ref 3.87–5.11)
RDW: 13.2 % (ref 11.5–15.5)
WBC: 7 10*3/uL (ref 4.0–10.5)
nRBC: 0 % (ref 0.0–0.2)

## 2020-08-08 LAB — PH, BODY FLUID: pH, Body Fluid: 7.4

## 2020-08-08 LAB — GLUCOSE, CAPILLARY
Glucose-Capillary: 115 mg/dL — ABNORMAL HIGH (ref 70–99)
Glucose-Capillary: 67 mg/dL — ABNORMAL LOW (ref 70–99)
Glucose-Capillary: 78 mg/dL (ref 70–99)
Glucose-Capillary: 78 mg/dL (ref 70–99)
Glucose-Capillary: 94 mg/dL (ref 70–99)
Glucose-Capillary: 89 mg/dL (ref 70–99)

## 2020-08-08 LAB — ECHOCARDIOGRAM LIMITED
Height: 64 in
Weight: 2132.8 oz

## 2020-08-08 LAB — PROTEIN, BODY FLUID (OTHER): Total Protein, Body Fluid Other: 4.6 g/dL

## 2020-08-08 LAB — GLUCOSE, BODY FLUID OTHER: Glucose, Body Fluid Other: 107 mg/dL

## 2020-08-08 LAB — LD, BODY FLUID (OTHER): LD, Body Fluid: 363 IU/L

## 2020-08-08 MED ORDER — ACETAMINOPHEN 325 MG PO TABS: 650.0000 mg | ORAL_TABLET | Freq: Four times a day (QID) | ORAL | Status: DC | PRN

## 2020-08-08 MED ORDER — DEXTROSE 50 % IV SOLN
25.0000 mL | Freq: Once | INTRAVENOUS | Status: AC
  Administered 2020-08-08: 25 mL via INTRAVENOUS

## 2020-08-08 MED ORDER — HYDRALAZINE HCL 25 MG PO TABS: 25.0000 mg | ORAL_TABLET | Freq: Four times a day (QID) | ORAL | Status: DC | PRN

## 2020-08-08 MED ORDER — ACETAMINOPHEN 650 MG RE SUPP: 650.0000 mg | Freq: Four times a day (QID) | RECTAL | Status: DC | PRN

## 2020-08-08 MED ORDER — SODIUM CHLORIDE 0.9 % IV SOLN: INTRAVENOUS | Status: DC

## 2020-08-08 MED ORDER — GUAIFENESIN 100 MG/5ML PO SOLN: 200.0000 mg | Freq: Three times a day (TID) | ORAL | Status: DC | PRN

## 2020-08-08 MED ORDER — DEXTROSE 50 % IV SOLN
INTRAVENOUS | Status: AC
  Filled 2020-08-08: qty 50

## 2020-08-08 MED ORDER — SODIUM CHLORIDE 0.9 % IV BOLUS: 500.0000 mL | Freq: Once | INTRAVENOUS | Status: DC

## 2020-08-08 MED ORDER — METOPROLOL TARTRATE 25 MG/10 ML ORAL SUSPENSION
25.0000 mg | Freq: Two times a day (BID) | ORAL | Status: DC
  Administered 2020-08-09: 25 mg
  Filled 2020-08-08: qty 10

## 2020-08-08 MED ORDER — COLCHICINE 0.6 MG PO TABS
0.6000 mg | ORAL_TABLET | Freq: Two times a day (BID) | ORAL | Status: DC
  Administered 2020-08-09: 0.6 mg
  Filled 2020-08-08: qty 1

## 2020-08-08 MED ORDER — FUROSEMIDE 10 MG/ML IJ SOLN
40.0000 mg | Freq: Once | INTRAMUSCULAR | Status: AC
  Administered 2020-08-08: 40 mg via INTRAVENOUS
  Filled 2020-08-08: qty 4

## 2020-08-08 MED ORDER — DEXMEDETOMIDINE HCL IN NACL 400 MCG/100ML IV SOLN
0.4000 ug/kg/h | INTRAVENOUS | Status: DC
  Administered 2020-08-08: 0.4 ug/kg/h via INTRAVENOUS
  Filled 2020-08-08 (×2): qty 100

## 2020-08-08 MED ORDER — SODIUM CHLORIDE 0.9 % IV SOLN
1.0000 g | INTRAVENOUS | Status: DC
  Administered 2020-08-08 – 2020-08-09 (×2): 1 g via INTRAVENOUS
  Filled 2020-08-08 (×3): qty 10

## 2020-08-08 NOTE — Consult Note (Signed)
Ref: [name], MD   Subjective:  Intubated and sedated. VS stable with pressure support. Afebrile.  Chest x-ray suggestive of vascular congestion. 290 cc pericardial fluid recovery overnight.  Objective:  Vital Signs in the last 24 hours: Temp:  [97.7 F (36.5 C)-98.9 F (37.2 C)] 98.7 F (37.1 C) (06/22 0405) Pulse Rate:  [76-163] 91 (06/22 0700) Cardiac Rhythm: Normal sinus rhythm (06/22 0400) Resp:  [16-25] 16 (06/22 0700) BP: (86-174)/(44-74) 160/51 (06/22 0700) SpO2:  [90 %-100 %] 96 % (06/22 0700) Arterial Line BP: (70-145)/(35-64) 135/63 (06/22 0700) FiO2 (%):  [50 %-100 %] 50 % (06/22 0359)  Physical Exam: BP Readings from Last 1 Encounters:  08/08/20 (!) 160/51     Wt Readings from Last 1 Encounters:  08/07/20 60.5 kg    Weight change:  Body mass index is 22.88 kg/m. HEENT: [place]/AT, Eyes-Brown, Conjunctiva-Pink, Sclera-Non-icteric Neck: No JVD, No bruit, Trachea midline. Lungs:  Clearing, Bilateral. Cardiac:  Regular rhythm, normal S1 and S2, no S3. II/VI systolic murmur. Abdomen:  Soft, non-tender. BS present. Extremities:  No edema present. No cyanosis. No clubbing. CNS: AxOx0, Cranial nerves grossly intact.  Skin: Warm and dry.   Intake/Output from previous day: 06/21 0701 - 06/22 0700 In: 992.2 [P.O.:477; I.V.:515.2] Out: 290 [Drains:290]    Lab Results: BMET    Component Value Date/Time   NA 140 08/07/2020 2009   NA 140 08/07/2020 1809   NA 141 08/07/2020 1640   K 4.4 08/07/2020 2009   K 4.3 08/07/2020 1809   K 4.3 08/07/2020 1640   CL 106 08/07/2020 1640   CL 102 08/07/2020 0246   CL 106 08/06/2020 0330   CO2 26 08/07/2020 0246   CO2 27 08/06/2020 0330   CO2 27 08/05/2020 2000   GLUCOSE 170 (H) 08/07/2020 1640   GLUCOSE 149 (H) 08/07/2020 0246   GLUCOSE 123 (H) 08/06/2020 0330   BUN 22 08/07/2020 1640   BUN 16 08/07/2020 0246   BUN 16 08/06/2020 0330   CREATININE 1.00 08/07/2020 1640   CREATININE 0.85 08/07/2020 0246    CREATININE 0.82 08/06/2020 0330   CREATININE 0.79 04/26/2020 1513   CREATININE 0.81 03/29/2020 1621   CREATININE 0.77 03/01/2020 1623   CALCIUM 9.0 08/07/2020 0246   CALCIUM 8.9 08/06/2020 0330   CALCIUM 9.3 08/05/2020 2000   GFRNONAA >60 08/07/2020 0246   GFRNONAA >60 08/06/2020 0330   GFRNONAA >60 08/05/2020 2000   GFRNONAA >60 04/26/2020 1513   GFRNONAA >60 03/29/2020 1621   GFRNONAA >60 03/01/2020 1623   CBC    Component Value Date/Time   WBC 7.0 08/08/2020 0300   RBC 3.55 (L) 08/08/2020 0300   HGB 10.4 (L) 08/08/2020 0300   HCT 32.2 (L) 08/08/2020 0300   PLT 211 08/08/2020 0300   MCV 90.7 08/08/2020 0300   MCH 29.3 08/08/2020 0300   MCHC 32.3 08/08/2020 0300   RDW 13.2 08/08/2020 0300   LYMPHSABS 0.8 08/05/2020 2000   MONOABS 0.5 08/05/2020 2000   EOSABS 0.0 08/05/2020 2000   BASOSABS 0.0 08/05/2020 2000   HEPATIC Function Panel Recent Labs    04/26/20 1513 05/24/20 1517 08/06/20 0330  PROT 9.2* 8.7* 7.4   HEMOGLOBIN A1C No components found for: HGA1C,  MPG CARDIAC ENZYMES No results found for: CKTOTAL, CKMB, CKMBINDEX, TROPONINI BNP No results for input(s): PROBNP in the last 8760 hours. TSH No results for input(s): TSH in the last 8760 hours. CHOLESTEROL No results for input(s): CHOL in the last 8760 hours.  Scheduled Meds:  chlorhexidine gluconate (MEDLINE KIT)  15 mL Mouth Rinse BID   Chlorhexidine Gluconate Cloth  6 each Topical Daily   colchicine  0.6 mg Oral BID   fentaNYL (SUBLIMAZE) injection  25 mcg Intravenous Once   insulin aspart  0-5 Units Subcutaneous QHS   insulin aspart  0-9 Units Subcutaneous TID WC   mouth rinse  15 mL Mouth Rinse 10 times per day   metoprolol tartrate  25 mg Oral BID   pantoprazole (PROTONIX) IV  40 mg Intravenous Q12H   potassium chloride SA  20 mEq Oral Daily   Continuous Infusions:  sodium chloride 10 mL/hr at 08/08/20 0013   sodium chloride 10 mL/hr at 08/08/20 0700   fentaNYL infusion INTRAVENOUS 100  mcg/hr (08/08/20 0700)   midazolam Stopped (08/07/20 1824)   phenylephrine (NEO-SYNEPHRINE) Adult infusion 15 mcg/min (08/08/20 0700)   PRN Meds:.acetaminophen **OR** acetaminophen, albuterol, fentaNYL, guaiFENesin, hydrALAZINE, hydrocortisone cream, ondansetron **OR** ondansetron (ZOFRAN) IV  Assessment/Plan:  Acute on chronic respiratory failure with hypoxia Chronic pericardial effusion  S/P pericardiocentesis  S/P PEA cardiac arrest  Mild diastolic dysfunction of left ventricle  Type 2 DM  Mild protein calorie malnutrition  Plan:  Repeat limited echocardiogram for pericardial fluid and LV function. Respiratory weaning per CCM.    LOS: 3 days   Time spent including chart review, lab review, examination, discussion with patient/Nurse/Family : 30 min   Dixie Dials  MD  08/08/2020, 7:41 AM

## 2020-08-08 NOTE — Progress Notes (Signed)
[facility] Progress Note Patient Name: Sara Ferguson DOB: 1945/07/04 MRN: [id number]   Date of Service  08/08/2020  HPI/Events of Note  Oliguria - CXR c/w pulmonary edema last night. Received Lasix 40 mg IV earlier today with net negative 350 fluid balance. Last Creatinine = 1.0.  eICU Interventions  Plan: Lasix 40 mg IV X 1 now.  Portable CXR at 5 AM.     Intervention Category Major Interventions: Other:  Lysle Dingwall 08/08/2020, 10:30 PM

## 2020-08-08 NOTE — Progress Notes (Signed)
Per Dr. [name], I notified pathology dept of her request to send pleural fluid from yesterday to [facility] for molecular testing.

## 2020-08-08 NOTE — Progress Notes (Signed)
NAME:  Sara Ferguson MRN:  [id number] DOB:  1946-02-14 LOS: 3 ADMISSION DATE:  08/05/2020 CONSULTATION DATE:  08/07/2020 REFERRING MD:  Shawna Clamp CHIEF COMPLAINT:  PEA arrest, mechanical ventilation   History of Present Illness:  75 year old woman with PMHx significant for HTN, T2DM and stage IV NSCLC (c/b recurrent malignant pleural effusion, on oral chemotherapy) who presented to [facility] 6/19 for SOB, increased LE edema and chest discomfort. CT Chest completed demonstrating large pericardial effusion. Transferred to [facility] for pericardiocentesis.  Patient underwent pericardiocentesis 6/21; during the procedure, patient experienced vagal inhibition on advancement of the wire and drainage tube and went into PEA arrest requiring 3-4 minutes CPR, Epi x 1 and atropine x 1 with ROSC. She remained intubated post-procedure in the setting of arrest and was transferred to the CVICU.  PCCM consulted for ventilator management s/p PEA arrest.  Pertinent Medical History:   Past Medical History:  Diagnosis Date   Arthritis    Diabetes mellitus without complication ([place])    Hypertension    Non-small cell lung cancer ([place])    Dx 12/30/19 by thoracentesis    Significant Hospital Events: Including procedures, antibiotic start and stop dates in addition to other pertinent events   6/21 - Underwent pericardiocentesis for moderate pericardial effusion; vagal inhibition with PEA arrest (3-4 min CPR, Epi x 1 and Atropine x 1) with ROSC, remained intubated post-arrest. PCCM consulted. 6/22 - Hypoventilation with small Vt on weaning.  6/22 - Echo shows no residual effusion and normal LV function but ++ LVH  Interim History / Subjective:  Still taking small tidal volumes on PSV even off sedation.  Minimal urine output.   Objective:  Blood pressure (!) 122/51, pulse 85, temperature 98.3 F (36.8 C), temperature source Oral, resp. rate 17, height 5\' 4"  (1.626 m), weight 60.5 kg, SpO2 100 %.    Vent Mode:  PRVC FiO2 (%):  [40 %-100 %] 40 % Set Rate:  [16 bmp-20 bmp] 20 bmp Vt Set:  [460 mL] 460 mL PEEP:  [5 cmH20-8 cmH20] 8 cmH20 Plateau Pressure:  [24 cmH20-27 cmH20] 25 cmH20   Intake/Output Summary (Last 24 hours) at 08/08/2020 1346 Last data filed at 08/08/2020 1300 Gross per 24 hour  Intake 755.45 ml  Output 465 ml  Net 290.45 ml    Filed Weights   08/07/20 0427  Weight: 60.5 kg   Physical Examination: General: Chronically ill-appearing elderly woman in NAD. HEENT: [place]/AT, anicteric sclera, PERRL, moist mucous membranes. ETT in place with tongue protruding. Neuro: Sedated. Responds to noxious stimuli. and Withdraws to pain in all 4 extremities. Following commands consistently. Moves all 4 extremities spontaneously. +Cough and +Gag  CV: RRR, no m/g/r. JVP at PULM:  Lung fields CTAB, Small volumes on PSV Chest: Pericardial drainage tube in place to Hemovac, sanguineous drainage. 175 ml in 12h GI: Soft, nontender, nondistended. Normoactive bowel sounds. Extremities: Trace bilateral symmetric LE edema noted. Skin: Warm/dry, no rashes.  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   Plan:  - Stopped all sedation - Diurese today to facilitate extubation.  - Plan to extubate - Keep pericardial drain in for now.  Best Practice (right click and "Reselect all SmartList Selections" daily)   Diet/type: NPO Pain/Anxiety/Delirium protocol RASS goal: 0 to -1 VAP protocol (if indicated): Yes DVT prophylaxis: SCD GI prophylaxis: PPI Glucose control:  SSI Central venous access:   N/A Arterial line:  Yes, and it is still needed Foley:  N/A Mobility:  bed rest  PT consulted: N/A Studies pending: XRAY Culture data pending:none Last reviewed culture data:today Antibiotics:not indicated  Antibiotic de-escalation: N/A Stop date: N/A Daily labs:  Per Primary Code Status:  full code Last date of multidisciplinary goals of care discussion [Per Primary Team] Disposition: remains critically ill, will stay in intensive care  Labs:   CBC: Recent Labs  Lab 08/05/20 2000 08/06/20 0330 08/07/20 0246 08/07/20 1639 08/07/20 1640 08/07/20 1809 08/07/20 2009 08/08/20 0300  WBC 4.6 5.7 4.6  --   --   --   --  7.0  NEUTROABS 3.3  --   --   --   --   --   --   --   HGB 13.3 11.6* 11.1* 11.6* 11.2* 10.9* 11.2* 10.4*  HCT 40.7 36.2 35.3* 34.0* 33.0* 32.0* 33.0* 32.2*  MCV 91.1 92.1 91.5  --   --   --   --  90.7  PLT 231 245 216  --   --   --   --  132    Basic Metabolic Panel: Recent Labs  Lab 08/05/20 2000 08/06/20 0330 08/07/20 0246 08/07/20 1639 08/07/20 1640 08/07/20 1809 08/07/20 2009  NA 138 139 135 140 141 140 140  K 3.9 4.3 3.9 4.2 4.3 4.3 4.4  CL 103 106 102  --  106  --   --   CO2 27 27 26   --   --   --   --   GLUCOSE 98 123* 149*  --  170*  --   --   BUN 14 16 16   --  22  --   --   CREATININE 0.68 0.82 0.85  --  1.00  --   --   CALCIUM 9.3 8.9 9.0  --   --   --   --   MG  --   --  2.0  --   --   --   --   PHOS  --   --  3.6  --   --   --   --     GFR: Estimated Creatinine Clearance: 40.7 mL/min (by C-G formula based on SCr of 1 mg/dL). Recent Labs  Lab 08/05/20 2000 08/06/20 0330 08/07/20 0246 08/08/20 0300  WBC 4.6 5.7 4.6 7.0  LATICACIDVEN 1.3  --   --   --     Liver Function Tests: Recent Labs  Lab 08/06/20 0330  AST 29  ALT 19  ALKPHOS 85  BILITOT 0.7  PROT 7.4  ALBUMIN 3.2*    No results for input(s): LIPASE, AMYLASE in the last 168 hours. No results for input(s): AMMONIA in the last 168 hours.  ABG:    Component  Value Date/Time   PHART 7.440 08/07/2020 2009   PCO2ART 32.4 08/07/2020 2009   PO2ART 77 (L) 08/07/2020 2009   HCO3 22.1 08/07/2020 2009   TCO2 23 08/07/2020 2009   ACIDBASEDEF 2.0 08/07/2020 2009   O2SAT 96.0 08/07/2020 2009     Coagulation Profile: No results for input(s): INR, PROTIME in the last 168 hours.  Cardiac Enzymes: No results for input(s): CKTOTAL, CKMB, CKMBINDEX, TROPONINI in the last 168 hours.  HbA1C: Hgb A1c MFr Bld  Date/Time Value Ref Range Status  08/06/2020 03:30 AM 6.3 (H) 4.8 - 5.6 % Final    Comment:    (NOTE)         Prediabetes: 5.7 - 6.4         Diabetes: >6.4         Glycemic control for adults with diabetes: <7.0   12/30/2019 11:18 AM 10.7 (H) 4.8 - 5.6 % Final    Comment:    (NOTE) Pre diabetes:          5.7%-6.4%  Diabetes:              >6.4%  Glycemic control for   <7.0% adults with diabetes    CBG: Recent Labs  Lab 08/07/20 1202 08/07/20 2033 08/08/20 0858 08/08/20 0928 08/08/20 1156  GLUCAP 117* 101* 67* 115* 78    CRITICAL CARE Performed by: Kipp Brood   Total critical care time: 40 minutes  Critical care time was exclusive of separately billable procedures and treating other patients.  Critical care was necessary to treat or prevent imminent or life-threatening deterioration.  Critical care was time spent personally by me on the following activities: development of treatment plan with patient and/or surrogate as well as nursing, discussions with consultants, evaluation of patient's response to treatment, examination of patient, obtaining history from patient or surrogate, ordering and performing treatments and interventions, ordering and review of laboratory studies, ordering and review of radiographic studies, pulse oximetry, re-evaluation of patient's condition and participation in multidisciplinary rounds.  Kipp Brood, MD [facility] ICU Physician [place]  Pager: [phone] Mobile:  [phone] After hours: [phone].

## 2020-08-08 NOTE — Progress Notes (Signed)
Hypoglycemic Event  CBG: 67  Treatment: half amp D50  Symptoms: none  Follow-up CBG: Time: 0928 CBG Result: 115  Possible Reasons for Event: NPO  Comments/MD notified: N/A, will address on morning rounds with [place]

## 2020-08-08 NOTE — Progress Notes (Signed)
  Echocardiogram 2D Echocardiogram has been performed.  [name] F 08/08/2020, 12:25 PM

## 2020-08-08 NOTE — Plan of Care (Signed)

## 2020-08-09 ENCOUNTER — Inpatient Hospital Stay (HOSPITAL_COMMUNITY): Payer: Medicare Other

## 2020-08-09 LAB — GLUCOSE, CAPILLARY
Glucose-Capillary: 87 mg/dL (ref 70–99)
Glucose-Capillary: 88 mg/dL (ref 70–99)
Glucose-Capillary: 93 mg/dL (ref 70–99)
Glucose-Capillary: 111 mg/dL — ABNORMAL HIGH (ref 70–99)
Glucose-Capillary: 170 mg/dL — ABNORMAL HIGH (ref 70–99)

## 2020-08-09 LAB — COMPREHENSIVE METABOLIC PANEL
ALT: 31 U/L (ref 0–44)
AST: 45 U/L — ABNORMAL HIGH (ref 15–41)
Albumin: 2.4 g/dL — ABNORMAL LOW (ref 3.5–5.0)
Alkaline Phosphatase: 76 U/L (ref 38–126)
Anion gap: 13 (ref 5–15)
BUN: 33 mg/dL — ABNORMAL HIGH (ref 8–23)
CO2: 18 mmol/L — ABNORMAL LOW (ref 22–32)
Calcium: 8.7 mg/dL — ABNORMAL LOW (ref 8.9–10.3)
Chloride: 108 mmol/L (ref 98–111)
Creatinine, Ser: 1.84 mg/dL — ABNORMAL HIGH (ref 0.44–1.00)
GFR, Estimated: 28 mL/min — ABNORMAL LOW (ref >60)
Glucose, Bld: 96 mg/dL (ref 70–99)
Potassium: 3.9 mmol/L (ref 3.5–5.1)
Sodium: 139 mmol/L (ref 135–145)
Total Bilirubin: 2.1 mg/dL — ABNORMAL HIGH (ref 0.3–1.2)
Total Protein: 6.5 g/dL (ref 6.5–8.1)

## 2020-08-09 LAB — CBC WITH DIFFERENTIAL/PLATELET
Abs Immature Granulocytes: 0.05 10*3/uL (ref 0.00–0.07)
Basophils Absolute: 0 10*3/uL (ref 0.0–0.1)
Basophils Relative: 0 %
Eosinophils Absolute: 0 10*3/uL (ref 0.0–0.5)
Eosinophils Relative: 0 %
HCT: 36.9 % (ref 36.0–46.0)
Hemoglobin: 12 g/dL (ref 12.0–15.0)
Immature Granulocytes: 1 %
Lymphocytes Relative: 9 %
Lymphs Abs: 0.9 10*3/uL (ref 0.7–4.0)
MCH: 29.3 pg (ref 26.0–34.0)
MCHC: 32.5 g/dL (ref 30.0–36.0)
MCV: 90 fL (ref 80.0–100.0)
Monocytes Absolute: 1 10*3/uL (ref 0.1–1.0)
Monocytes Relative: 10 %
Neutro Abs: 7.9 10*3/uL — ABNORMAL HIGH (ref 1.7–7.7)
Neutrophils Relative %: 80 %
Platelets: 201 10*3/uL (ref 150–400)
RBC: 4.1 MIL/uL (ref 3.87–5.11)
RDW: 13.6 % (ref 11.5–15.5)
WBC: 9.8 10*3/uL (ref 4.0–10.5)
nRBC: 0 % (ref 0.0–0.2)

## 2020-08-09 LAB — CYTOLOGY - NON PAP

## 2020-08-09 MED ORDER — COLCHICINE 0.6 MG PO TABS: 0.6000 mg | ORAL_TABLET | Freq: Every day | ORAL | Status: DC

## 2020-08-09 MED ORDER — ORAL CARE MOUTH RINSE: 15.0000 mL | Freq: Two times a day (BID) | OROMUCOSAL | Status: DC

## 2020-08-09 MED ORDER — POLYETHYLENE GLYCOL 3350 17 G PO PACK
17.0000 g | PACK | Freq: Every day | ORAL | Status: DC
  Administered 2020-08-09: 17 g
  Filled 2020-08-09: qty 1

## 2020-08-09 MED ORDER — PANTOPRAZOLE SODIUM 40 MG PO PACK
40.0000 mg | PACK | Freq: Every day | ORAL | Status: DC
  Administered 2020-08-09: 40 mg
  Filled 2020-08-09: qty 20

## 2020-08-09 MED ORDER — DOCUSATE SODIUM 50 MG/5ML PO LIQD
100.0000 mg | Freq: Two times a day (BID) | ORAL | Status: DC
  Administered 2020-08-09 (×2): 100 mg
  Filled 2020-08-09 (×2): qty 10

## 2020-08-09 MED ORDER — INSULIN ASPART 100 UNIT/ML IJ SOLN
0.0000 [IU] | INTRAMUSCULAR | Status: DC
  Administered 2020-08-09 – 2020-08-10 (×2): 3 [IU] via SUBCUTANEOUS
  Administered 2020-08-10: 2 [IU] via SUBCUTANEOUS

## 2020-08-09 MED ORDER — FENTANYL CITRATE (PF) 100 MCG/2ML IJ SOLN: 25.0000 ug | INTRAMUSCULAR | Status: DC | PRN

## 2020-08-09 MED ORDER — ALBUMIN HUMAN 25 % IV SOLN
12.5000 g | Freq: Four times a day (QID) | INTRAVENOUS | Status: AC
  Administered 2020-08-09 (×2): 12.5 g via INTRAVENOUS
  Filled 2020-08-09 (×2): qty 50

## 2020-08-09 MED ORDER — ALBUMIN HUMAN 25 % IV SOLN
12.5000 g | Freq: Once | INTRAVENOUS | Status: AC
  Administered 2020-08-09: 12.5 g via INTRAVENOUS
  Filled 2020-08-09: qty 50

## 2020-08-09 NOTE — Consult Note (Addendum)
Ref: [name], MD   Subjective:  Awake. Extubated. Acute renal failure. Mild fever. Sinus tachycardia at times. Significant decrease in pericardial drain output. Limited echocardiogram shows preserved LV systolic function with no evidence of pericardial fluid.  Objective:  Vital Signs in the last 24 hours: Temp:  [97.9 F (36.6 C)-100.2 F (37.9 C)] 97.9 F (36.6 C) (06/23 1937) Pulse Rate:  [74-112] 108 (06/23 1900) Cardiac Rhythm: Sinus tachycardia (06/23 1953) Resp:  [10-33] 23 (06/23 1900) BP: (78-153)/(43-81) 131/57 (06/23 1900) SpO2:  [91 %-100 %] 97 % (06/23 1900) FiO2 (%):  [36 %-40 %] 36 % (06/23 1210) Weight:  [62.8 kg] 62.8 kg (06/23 0335)  Physical Exam: BP Readings from Last 1 Encounters:  08/09/20 (!) 131/57     Wt Readings from Last 1 Encounters:  08/09/20 62.8 kg    Weight change:  Body mass index is 23.76 kg/m. HEENT: [place]/AT, Eyes-Brown, Conjunctiva-Pink, Sclera-Non-icteric Neck: No JVD, No bruit, Trachea midline. Lungs:  Clear,ing Bilateral. Cardiac:  Regular rhythm, normal S1 and S2, no S3. II/VI systolic murmur. Abdomen:  Soft, non-tender. BS present. Extremities:  No edema present. No cyanosis. No clubbing. CNS: AxOx1. Cranial nerves grossly intact, moves all 4 extremities.  Skin: Warm and dry.   Intake/Output from previous day: 06/22 0701 - 06/23 0700 In: 812.6 [I.V.:712.5; IV Piggyback:100.1] Out: 8786 [Urine:1120; Emesis/NG output:60; Drains:203]    Lab Results: BMET    Component Value Date/Time   NA 139 08/09/2020 0647   NA 140 08/07/2020 2009   NA 140 08/07/2020 1809   K 3.9 08/09/2020 0647   K 4.4 08/07/2020 2009   K 4.3 08/07/2020 1809   CL 108 08/09/2020 0647   CL 106 08/07/2020 1640   CL 102 08/07/2020 0246   CO2 18 (L) 08/09/2020 0647   CO2 26 08/07/2020 0246   CO2 27 08/06/2020 0330   GLUCOSE 96 08/09/2020 0647   GLUCOSE 170 (H) 08/07/2020 1640   GLUCOSE 149 (H) 08/07/2020 0246   BUN 33 (H) 08/09/2020 0647   BUN  22 08/07/2020 1640   BUN 16 08/07/2020 0246   CREATININE 1.84 (H) 08/09/2020 0647   CREATININE 1.00 08/07/2020 1640   CREATININE 0.85 08/07/2020 0246   CREATININE 0.79 04/26/2020 1513   CREATININE 0.81 03/29/2020 1621   CREATININE 0.77 03/01/2020 1623   CALCIUM 8.7 (L) 08/09/2020 0647   CALCIUM 9.0 08/07/2020 0246   CALCIUM 8.9 08/06/2020 0330   GFRNONAA 28 (L) 08/09/2020 0647   GFRNONAA >60 08/07/2020 0246   GFRNONAA >60 08/06/2020 0330   GFRNONAA >60 04/26/2020 1513   GFRNONAA >60 03/29/2020 1621   GFRNONAA >60 03/01/2020 1623   CBC    Component Value Date/Time   WBC 9.8 08/09/2020 0647   RBC 4.10 08/09/2020 0647   HGB 12.0 08/09/2020 0647   HCT 36.9 08/09/2020 0647   PLT 201 08/09/2020 0647   MCV 90.0 08/09/2020 0647   MCH 29.3 08/09/2020 0647   MCHC 32.5 08/09/2020 0647   RDW 13.6 08/09/2020 0647   LYMPHSABS 0.9 08/09/2020 0647   MONOABS 1.0 08/09/2020 0647   EOSABS 0.0 08/09/2020 0647   BASOSABS 0.0 08/09/2020 0647   HEPATIC Function Panel Recent Labs    05/24/20 1517 08/06/20 0330 08/09/20 0647  PROT 8.7* 7.4 6.5   HEMOGLOBIN A1C No components found for: HGA1C,  MPG CARDIAC ENZYMES No results found for: CKTOTAL, CKMB, CKMBINDEX, TROPONINI BNP No results for input(s): PROBNP in the last 8760 hours. TSH No results for input(s): TSH in the  last 8760 hours. CHOLESTEROL No results for input(s): CHOL in the last 8760 hours.  Scheduled Meds:  Chlorhexidine Gluconate Cloth  6 each Topical Daily   [START ON 08/10/2020] colchicine  0.6 mg Per Tube Daily   docusate  100 mg Per Tube BID   insulin aspart  0-15 Units Subcutaneous Q4H   metoprolol tartrate  25 mg Per Tube BID   polyethylene glycol  17 g Per Tube Daily   Continuous Infusions:  sodium chloride Stopped (08/08/20 1112)   sodium chloride Stopped (08/09/20 1528)   cefTRIAXone (ROCEPHIN)  IV Stopped (08/09/20 0922)   PRN Meds:.acetaminophen **OR** acetaminophen, albuterol, guaiFENesin, hydrALAZINE,  hydrocortisone cream, ondansetron **OR** ondansetron (ZOFRAN) IV  Assessment/Plan:  Acute on chronic respiratory failure with hypoxia Chronic pericardial effusion S/P pericardiocentesis S/P pericardial drain S/P PEA cardiac arrest Mild diastolic dysfunction of left ventricle Type 2 DM Mild protein calorie malnutrition Acute renal failure  Appreciate CCM management. Continue monitoring pericardial drain output. Pharmacy assistance in reducing colchicine dose.   LOS: 4 days   Time spent including chart review, lab review, examination, discussion with patient/Nurse/Doctor : 30 min   Dixie Dials  MD  08/09/2020, 8:17 PM

## 2020-08-09 NOTE — Procedures (Signed)
Extubation Procedure Note  Patient Details:   Name: Sara Ferguson DOB: [birth year] MRN: [id number]   Airway Documentation:    Vent end date: 08/09/20 Vent end time: 1209   Evaluation  O2 sats: stable throughout Complications: No apparent complications Patient did tolerate procedure well. Bilateral Breath Sounds: Clear, Diminished   Yes  Patient extubated per order to 4L [place] with no apparent complications. Positive cuff leak was noted prior to extubation. Patient is alert and oriented and is able to speak. Vitals are stable. RT will continue to monitor.   Alasia Enge Clyda Greener 08/09/2020, 12:12 PM

## 2020-08-09 NOTE — Progress Notes (Addendum)
NAME:  Sara Ferguson MRN:  [id number] DOB:  January 29, 1946 LOS: 4 ADMISSION DATE:  08/05/2020 CONSULTATION DATE:  08/07/2020 REFERRING MD:  Shawna Clamp CHIEF COMPLAINT:  PEA arrest, mechanical ventilation   History of Present Illness:  75 year old woman with PMHx significant for HTN, T2DM and stage IV NSCLC (c/b recurrent malignant pleural effusion, on oral chemotherapy) who presented to [facility] 6/19 for SOB, increased LE edema and chest discomfort. CT Chest completed demonstrating large pericardial effusion. Transferred to [facility] for pericardiocentesis.  Patient underwent pericardiocentesis 6/21; during the procedure, patient experienced vagal inhibition on advancement of the wire and drainage tube and went into PEA arrest requiring 3-4 minutes CPR, Epi x 1 and atropine x 1 with ROSC. She remained intubated post-procedure in the setting of arrest and was transferred to the CVICU.  PCCM consulted for ventilator management s/p PEA arrest.  Pertinent Medical History:   Past Medical History:  Diagnosis Date   Arthritis    Diabetes mellitus without complication ([place])    Hypertension    Non-small cell lung cancer ([place])    Dx 12/30/19 by thoracentesis    Significant Hospital Events: Including procedures, antibiotic start and stop dates in addition to other pertinent events   6/21 - Underwent pericardiocentesis for moderate pericardial effusion; vagal inhibition with PEA arrest (3-4 min CPR, Epi x 1 and Atropine x 1) with ROSC, remained intubated post-arrest. PCCM consulted. 6/22 - Hypoventilation with small Vt on weaning.  6/22 - Echo shows no residual effusion and normal LV function but ++ LVH  Interim History / Subjective:  Failed SBT this AM due to apnea. Now off sedation.   Objective:  Blood pressure (!) 135/56, pulse 84, temperature 100.2 F (37.9 C), temperature source Axillary, resp. rate 20, height 5\' 4"  (1.626 m), weight 62.8 kg, SpO2 100 %.    Vent Mode: PRVC FiO2 (%):  [40 %] 40  % Set Rate:  [20 bmp] 20 bmp Vt Set:  [440 mL-460 mL] 440 mL PEEP:  [8 cmH20] 8 cmH20 Plateau Pressure:  [24 cmH20-27 cmH20] 24 cmH20   Intake/Output Summary (Last 24 hours) at 08/09/2020 0844 Last data filed at 08/09/2020 0800 Gross per 24 hour  Intake 863.5 ml  Output 1408 ml  Net -544.5 ml   Filed Weights   08/07/20 0427 08/09/20 0335  Weight: 60.5 kg 62.8 kg   Physical Examination: General: Chronically ill-appearing elderly woman in NAD. HEENT: ETT/OG in place  Neuro: Alert, follows commands CV: RRR, no MRG PULM:  diminished breath sounds to left, no crackles/wheeze  Chest: Pericardial drainage tube in place to Hemovac GI: Soft, nontender, non-distended, active bowel sounds  Extremities: -edema  Skin: Warm/dry, no rashes.  Labs/imaging that I have personally reviewed: (right click and "Reselect all SmartList Selections" daily)   CXR 6/23 > Cardiomegaly. Bilateral pulmonary infiltrates/edema, improved from prior exam. Large left pleural effusion  WBC 9.8. Crt 1.84. BUN 33   Resolved Hospital Problem List:     Assessment & Plan:   Brief PEA arrest in the setting of pericardiocentesis Malignant pericardial effusion -Repeat ECHO 6/22 with no pericardial effusion, EF 65-70 Plan -Cardiology Following  -Continue Drain   Hypotension, suspect secondary to sedation  H/O HTN Plan -Titrate NEO for MAP goal >65 -Hold home metoprolol, hydralazine until BP as improved.   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 Hold further diuresis  -Trend BMP -Replace electrolytes as indicated   Type 2 DM Plan -Trend glucose  -SSI   Adenocarcinoma left lung stage IV > followed by Dr. [name]    Best Practice (right click and "Reselect all SmartList Selections" daily)   Diet/type: NPO Pain/Anxiety/Delirium protocol RASS goal: 0 to -1. D/C fentanyl gtt change to PRN. Continue Precedex as needed to achieve RASS goal  VAP protocol (if indicated): Yes DVT prophylaxis: SCD GI prophylaxis: PPI Glucose control:  SSI Central venous access:  N/A Arterial line:  N/A and Yes, and it is still needed Foley:  Yes, and it is still needed Mobility:  bed rest  PT consulted: N/A Studies pending: None Culture data pending:sputum Last reviewed culture data:today Antibiotics:ceftriaxone Antibiotic de-escalation: N/A Stop date: N/A Daily labs:  Per Primary Code Status:  full code Last date of multidisciplinary goals of care discussion pending.  Disposition: remains critically ill, will stay in intensive care  Labs:   CBC: Recent Labs  Lab 08/05/20 2000 08/06/20 0330 08/07/20 0246 08/07/20 1639 08/07/20 1640 08/07/20 1809 08/07/20 2009 08/08/20 0300 08/09/20 0647  WBC 4.6 5.7 4.6  --   --   --   --  7.0 9.8  NEUTROABS 3.3  --   --   --   --   --   --   --  7.9*  HGB 13.3 11.6* 11.1*   < > 11.2* 10.9* 11.2* 10.4* 12.0  HCT 40.7 36.2 35.3*   < > 33.0* 32.0* 33.0* 32.2* 36.9  MCV 91.1 92.1 91.5  --   --   --   --  90.7 90.0  PLT 231 245 216  --   --   --   --  211 201   < > = values in this interval not displayed.   Basic Metabolic Panel: Recent Labs  Lab 08/05/20 2000 08/06/20 0330 08/07/20 0246 08/07/20 1639 08/07/20 1640 08/07/20 1809 08/07/20 2009 08/09/20 0647  NA 138 139 135 140 141 140 140 139  K 3.9 4.3 3.9 4.2 4.3 4.3 4.4 3.9  CL 103 106 102  --  106  --    --  108  CO2 27 27 26   --   --   --   --  18*  GLUCOSE 98 123* 149*  --  170*  --   --  96  BUN 14 16 16   --  22  --   --  33*  CREATININE 0.68 0.82 0.85  --  1.00  --   --  1.84*  CALCIUM 9.3 8.9 9.0  --   --   --   --  8.7*  MG  --   --  2.0  --   --   --   --   --   PHOS  --   --  3.6  --   --   --   --   --    GFR: Estimated Creatinine Clearance: 22.1 mL/min (A) (by C-G formula based on SCr of 1.84 mg/dL (H)). Recent Labs  Lab 08/05/20 2000 08/06/20 0330 08/07/20  0246 08/08/20 0300 08/09/20 0647  WBC 4.6 5.7 4.6 7.0 9.8  LATICACIDVEN 1.3  --   --   --   --    Liver Function Tests: Recent Labs  Lab 08/06/20 0330 08/09/20 0647  AST 29 45*  ALT 19 31  ALKPHOS 85 76  BILITOT 0.7 2.1*  PROT 7.4 6.5  ALBUMIN 3.2* 2.4*   No results for input(s): LIPASE, AMYLASE in the last 168 hours. No results for input(s): AMMONIA in the last 168 hours.  ABG:    Component Value Date/Time   PHART 7.440 08/07/2020 2009   PCO2ART 32.4 08/07/2020 2009   PO2ART 77 (L) 08/07/2020 2009   HCO3 22.1 08/07/2020 2009   TCO2 23 08/07/2020 2009   ACIDBASEDEF 2.0 08/07/2020 2009   O2SAT 96.0 08/07/2020 2009    Coagulation Profile: No results for input(s): INR, PROTIME in the last 168 hours.  Cardiac Enzymes: No results for input(s): CKTOTAL, CKMB, CKMBINDEX, TROPONINI in the last 168 hours.  HbA1C: Hgb A1c MFr Bld  Date/Time Value Ref Range Status  08/06/2020 03:30 AM 6.3 (H) 4.8 - 5.6 % Final    Comment:    (NOTE)         Prediabetes: 5.7 - 6.4         Diabetes: >6.4         Glycemic control for adults with diabetes: <7.0   12/30/2019 11:18 AM 10.7 (H) 4.8 - 5.6 % Final    Comment:    (NOTE) Pre diabetes:          5.7%-6.4%  Diabetes:              >6.4%  Glycemic control for   <7.0% adults with diabetes    CBG: Recent Labs  Lab 08/08/20 1700 08/08/20 1942 08/08/20 2307 08/09/20 0333 08/09/20 0804  GLUCAP 78 94 89 87 88   CRITICAL CARE Performed by: Omar Person   Total critical care time: 32 minutes  Critical care time was exclusive of separately billable procedures and treating other patients.  Critical care was necessary to treat or prevent imminent or life-threatening deterioration.  Critical care was time spent personally by me on the following activities: development of treatment plan with patient and/or surrogate as well as nursing, discussions with consultants, evaluation of patient's response to treatment, examination of patient, obtaining history from patient or surrogate, ordering and performing treatments and interventions, ordering and review of laboratory studies, ordering and review of radiographic studies, pulse oximetry, re-evaluation of patient's condition and participation in multidisciplinary rounds.  Hayden Pedro, AGACNP-BC [facility]  PCCM Pgr: [phone]

## 2020-08-10 ENCOUNTER — Telehealth: Payer: Self-pay | Admitting: Nurse Practitioner

## 2020-08-10 ENCOUNTER — Encounter: Payer: Self-pay | Admitting: *Deleted

## 2020-08-10 ENCOUNTER — Inpatient Hospital Stay (HOSPITAL_COMMUNITY): Payer: Medicare Other

## 2020-08-10 DIAGNOSIS — I313 Pericardial effusion (noninflammatory): Principal | ICD-10-CM

## 2020-08-10 DIAGNOSIS — Z515 Encounter for palliative care: Secondary | ICD-10-CM

## 2020-08-10 DIAGNOSIS — Z7189 Other specified counseling: Secondary | ICD-10-CM

## 2020-08-10 LAB — CBC
HCT: 36 % (ref 36.0–46.0)
Hemoglobin: 11.6 g/dL — ABNORMAL LOW (ref 12.0–15.0)
MCH: 28.9 pg (ref 26.0–34.0)
MCHC: 32.2 g/dL (ref 30.0–36.0)
MCV: 89.8 fL (ref 80.0–100.0)
Platelets: 190 10*3/uL (ref 150–400)
RBC: 4.01 MIL/uL (ref 3.87–5.11)
RDW: 13.6 % (ref 11.5–15.5)
WBC: 10.5 10*3/uL (ref 4.0–10.5)
nRBC: 0 % (ref 0.0–0.2)

## 2020-08-10 LAB — BASIC METABOLIC PANEL
Anion gap: 10 (ref 5–15)
BUN: 47 mg/dL — ABNORMAL HIGH (ref 8–23)
CO2: 22 mmol/L (ref 22–32)
Calcium: 8.8 mg/dL — ABNORMAL LOW (ref 8.9–10.3)
Chloride: 103 mmol/L (ref 98–111)
Creatinine, Ser: 1.67 mg/dL — ABNORMAL HIGH (ref 0.44–1.00)
GFR, Estimated: 31 mL/min — ABNORMAL LOW (ref >60)
Glucose, Bld: 160 mg/dL — ABNORMAL HIGH (ref 70–99)
Potassium: 4 mmol/L (ref 3.5–5.1)
Sodium: 135 mmol/L (ref 135–145)

## 2020-08-10 LAB — GLUCOSE, CAPILLARY
Glucose-Capillary: 104 mg/dL — ABNORMAL HIGH (ref 70–99)
Glucose-Capillary: 123 mg/dL — ABNORMAL HIGH (ref 70–99)
Glucose-Capillary: 151 mg/dL — ABNORMAL HIGH (ref 70–99)
Glucose-Capillary: 163 mg/dL — ABNORMAL HIGH (ref 70–99)
Glucose-Capillary: 171 mg/dL — ABNORMAL HIGH (ref 70–99)
Glucose-Capillary: 197 mg/dL — ABNORMAL HIGH (ref 70–99)

## 2020-08-10 LAB — ECHOCARDIOGRAM LIMITED
Height: 64 in
Weight: 2215.18 oz

## 2020-08-10 LAB — PHOSPHORUS: Phosphorus: 4.3 mg/dL (ref 2.5–4.6)

## 2020-08-10 LAB — MAGNESIUM: Magnesium: 1.9 mg/dL (ref 1.7–2.4)

## 2020-08-10 MED ORDER — POLYETHYLENE GLYCOL 3350 17 G PO PACK: 17.0000 g | PACK | Freq: Every day | ORAL | Status: DC

## 2020-08-10 MED ORDER — DOCUSATE SODIUM 50 MG/5ML PO LIQD: 100.0000 mg | Freq: Two times a day (BID) | ORAL | Status: DC

## 2020-08-10 MED ORDER — ACETAMINOPHEN 325 MG PO TABS
650.0000 mg | ORAL_TABLET | Freq: Four times a day (QID) | ORAL | Status: DC | PRN
  Administered 2020-08-12: 650 mg via ORAL
  Filled 2020-08-10: qty 2

## 2020-08-10 MED ORDER — HYDRALAZINE HCL 25 MG PO TABS
25.0000 mg | ORAL_TABLET | Freq: Four times a day (QID) | ORAL | Status: DC | PRN
  Administered 2020-08-15: 25 mg via ORAL
  Filled 2020-08-10: qty 1

## 2020-08-10 MED ORDER — COLCHICINE 0.6 MG PO TABS
0.6000 mg | ORAL_TABLET | Freq: Every day | ORAL | Status: DC
  Administered 2020-08-10: 0.6 mg via ORAL
  Filled 2020-08-10: qty 1

## 2020-08-10 MED ORDER — ACETAMINOPHEN 650 MG RE SUPP: 650.0000 mg | Freq: Four times a day (QID) | RECTAL | Status: DC | PRN

## 2020-08-10 MED ORDER — OSIMERTINIB MESYLATE 80 MG PO TABS
80.0000 mg | ORAL_TABLET | Freq: Every day | ORAL | Status: DC
  Administered 2020-08-11 – 2020-08-15 (×5): 80 mg via ORAL
  Filled 2020-08-10 (×6): qty 1

## 2020-08-10 MED ORDER — OSIMERTINIB MESYLATE 80 MG PO TABS
80.0000 mg | ORAL_TABLET | Freq: Every day | ORAL | Status: DC
Start: 2020-08-10 — End: 2020-08-10
  Administered 2020-08-10: 80 mg via ORAL

## 2020-08-10 MED ORDER — GUAIFENESIN 100 MG/5ML PO SOLN
200.0000 mg | Freq: Three times a day (TID) | ORAL | Status: DC | PRN
  Administered 2020-08-17: 100 mg via ORAL
  Filled 2020-08-10: qty 5

## 2020-08-10 MED ORDER — METOPROLOL TARTRATE 25 MG/10 ML ORAL SUSPENSION
25.0000 mg | Freq: Two times a day (BID) | ORAL | Status: DC
  Administered 2020-08-10: 25 mg via ORAL
  Filled 2020-08-10: qty 10

## 2020-08-10 MED ORDER — METOPROLOL TARTRATE 25 MG PO TABS
25.0000 mg | ORAL_TABLET | Freq: Two times a day (BID) | ORAL | Status: DC
  Administered 2020-08-10 – 2020-08-17 (×14): 25 mg via ORAL
  Filled 2020-08-10 (×14): qty 1

## 2020-08-10 NOTE — Progress Notes (Signed)
NAME:  Sara Ferguson MRN:  [id number] DOB:  [birth year] LOS: 5 ADMISSION DATE:  08/05/2020 CONSULTATION DATE:  08/07/2020 REFERRING MD:  Shawna Clamp CHIEF COMPLAINT:  PEA arrest, mechanical ventilation   History of Present Illness:  75 year old woman with PMHx significant for HTN, T2DM and stage IV NSCLC (c/b recurrent malignant pleural effusion, on oral chemotherapy) who presented to [facility] 6/19 for SOB, increased LE edema and chest discomfort. CT Chest completed demonstrating large pericardial effusion. Transferred to [facility] for pericardiocentesis.  Patient underwent pericardiocentesis 6/21; during the procedure, patient experienced vagal inhibition on advancement of the wire and drainage tube and went into PEA arrest requiring 3-4 minutes CPR, Epi x 1 and atropine x 1 with ROSC. She remained intubated post-procedure in the setting of arrest and was transferred to the CVICU.  PCCM consulted for ventilator management s/p PEA arrest.  Pertinent Medical History:   Past Medical History:  Diagnosis Date   Arthritis    Diabetes mellitus without complication ([place])    Hypertension    Non-small cell lung cancer ([place])    Dx 12/30/19 by thoracentesis    Significant Hospital Events: Including procedures, antibiotic start and stop dates in addition to other pertinent events   6/21 - Underwent pericardiocentesis for moderate pericardial effusion; vagal inhibition with PEA arrest (3-4 min CPR, Epi x 1 and Atropine x 1) with ROSC, remained intubated post-arrest. PCCM consulted. 6/22 - Hypoventilation with small Vt on weaning.  6/22 - Echo shows no residual effusion and normal LV function but ++ LVH 6/23-extubated. 6/24-reports symptoms much improved.  Repeat echocardiogram shows trace pericardial effusion Interim History / Subjective:  Extubated yesterday.  Today denies dyspnea and feels that her breathing is back to normal.  Objective:  Blood pressure (!) 129/56, pulse 87, temperature 97.6 F (36.4  C), temperature source Axillary, resp. rate 18, height 5\' 4"  (1.626 m), weight 62.8 kg, SpO2 96 %.        Intake/Output Summary (Last 24 hours) at 08/10/2020 1331 Last data filed at 08/10/2020 0800 Gross per 24 hour  Intake 194.83 ml  Output 480 ml  Net -285.17 ml    Filed Weights   08/07/20 0427 08/09/20 0335  Weight: 60.5 kg 62.8 kg   Physical Examination: General: Chronically ill-appearing elderly woman in NAD.  Appears frail. HEENT: Mucous membranes are moist Neuro: Alert, follows commands no focal deficit CV: No JVD.  Apex beat not displaced or sustained.  No pericardial rub. PULM: Clear to auscultation bilaterally. Chest: Pericardial drainage tube in place to Hemovac output GI: Soft, nontender, non-distended, active bowel sounds  Extremities: -edema  Skin: Warm/dry, no rashes.  Labs/imaging that I have personally reviewed: (right click and "Reselect all SmartList Selections" daily)   Echocardiogram 6/24.  Normal LV RV function.  Trace pericardial effusion. Reaction has improved somewhat to 1.67.  Resolved Hospital Problem List:     Assessment & Plan:   Brief PEA arrest in the setting of pericardiocentesis Malignant pericardial effusion -Repeat ECHO 6/22 with no pericardial effusion, EF 65-70 Plan -Drain to stay in for 1 more day per Dr. 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 [name]    Best Practice (right  click and "Reselect all SmartList Selections" daily)   Diet/type: NPO Pain/Anxiety/Delirium protocol RASS goal: 0 to -1. D/C fentanyl gtt change to PRN. Continue Precedex as needed to achieve RASS goal  VAP protocol (if indicated): Yes DVT prophylaxis: SCD GI prophylaxis: PPI Glucose control:  SSI Central venous access:  N/A Arterial line:  N/A and Yes, and it is still needed Foley:  Yes, and it is still needed Mobility:  bed rest  PT consulted: N/A Studies pending: None Culture data pending:sputum Last reviewed culture data:today Antibiotics:ceftriaxone Antibiotic de-escalation: N/A Stop date: N/A Daily labs:  Per Primary Code Status:  full code Last date of multidisciplinary goals of care discussion pending.  Disposition: remains critically ill, will stay in intensive care  Labs:   CBC: Recent Labs  Lab 08/05/20 2000 08/06/20 0330 08/07/20 0246 08/07/20 1639 08/07/20 1809 08/07/20 2009 08/08/20 0300 08/09/20 0647 08/10/20 0045  WBC 4.6 5.7 4.6  --   --   --  7.0 9.8 10.5  NEUTROABS 3.3  --   --   --   --   --   --  7.9*  --   HGB 13.3 11.6* 11.1*   < > 10.9* 11.2* 10.4* 12.0 11.6*  HCT 40.7 36.2 35.3*   < > 32.0* 33.0* 32.2* 36.9 36.0  MCV 91.1 92.1 91.5  --   --   --  90.7 90.0 89.8  PLT 231 245 216  --   --   --  211 201 190   < > = values in this interval not displayed.    Basic Metabolic Panel: Recent Labs  Lab 08/05/20 2000 08/06/20 0330 08/07/20 0246 08/07/20 1639 08/07/20 1640 08/07/20 1809 08/07/20 2009 08/09/20 0647 08/10/20 0045  NA 138 139 135   < > 141 140 140 139 135  K 3.9 4.3 3.9   < > 4.3 4.3 4.4 3.9 4.0  CL 103 106 102  --  106  --   --  108 103  CO2 27 27 26   --   --   --   --  18* 22  GLUCOSE 98 123* 149*  --  170*  --   --  96 160*  BUN 14 16 16   --  22  --   --  33* 47*  CREATININE 0.68 0.82 0.85  --  1.00  --   --  1.84* 1.67*  CALCIUM 9.3 8.9 9.0  --   --   --   --  8.7* 8.8*  MG  --   --  2.0  --   --   --   --   --  1.9   PHOS  --   --  3.6  --   --   --   --   --  4.3   < > = values in this interval not displayed.    GFR: Estimated Creatinine Clearance: 24.4 mL/min (A) (by C-G formula based on SCr of 1.67 mg/dL (H)). Recent Labs  Lab 08/05/20 2000 08/06/20 0330 08/07/20 0246 08/08/20 0300 08/09/20 0647 08/10/20 0045  WBC 4.6   < > 4.6 7.0 9.8 10.5  LATICACIDVEN 1.3  --   --   --   --   --    < > = values in this interval not displayed.    Liver Function  Tests: Recent Labs  Lab 08/06/20 0330 08/09/20 0647  AST 29 45*  ALT 19 31  ALKPHOS 85 76  BILITOT 0.7 2.1*  PROT 7.4 6.5  ALBUMIN 3.2* 2.4*    No results for input(s): LIPASE, AMYLASE in the last 168 hours. No results for input(s): AMMONIA in the last 168 hours.  ABG:    Component Value Date/Time   PHART 7.440 08/07/2020 2009   PCO2ART 32.4 08/07/2020 2009   PO2ART 77 (L) 08/07/2020 2009   HCO3 22.1 08/07/2020 2009   TCO2 23 08/07/2020 2009   ACIDBASEDEF 2.0 08/07/2020 2009   O2SAT 96.0 08/07/2020 2009     Coagulation Profile: No results for input(s): INR, PROTIME in the last 168 hours.  Cardiac Enzymes: No results for input(s): CKTOTAL, CKMB, CKMBINDEX, TROPONINI in the last 168 hours.  HbA1C: Hgb A1c MFr Bld  Date/Time Value Ref Range Status  08/06/2020 03:30 AM 6.3 (H) 4.8 - 5.6 % Final    Comment:    (NOTE)         Prediabetes: 5.7 - 6.4         Diabetes: >6.4         Glycemic control for adults with diabetes: <7.0   12/30/2019 11:18 AM 10.7 (H) 4.8 - 5.6 % Final    Comment:    (NOTE) Pre diabetes:          5.7%-6.4%  Diabetes:              >6.4%  Glycemic control for   <7.0% adults with diabetes    CBG: Recent Labs  Lab 08/09/20 1934 08/10/20 0000 08/10/20 0410 08/10/20 0648 08/10/20 1138  GLUCAP 170* 151* [place]    Kipp Brood, MD [facility] ICU Physician [place]  Pager: [phone] Or Epic Secure Chat After hours: [phone].  08/10/2020, 1:43 PM

## 2020-08-10 NOTE — Consult Note (Signed)
Palliative Medicine Inpatient Consult Note  Reason for consult:  "Stage IV lung cancer on treatment but frail"  HPI:  Per intake H&P --> 75 year old woman with PMHx significant for HTN, T2DM and stage IV NSCLC (c/b recurrent malignant pleural effusion, on oral chemotherapy) who presented to [facility] 6/19 for SOB, increased LE edema and chest discomfort. CT Chest completed demonstrating large pericardial effusion. Transferred to [facility] for pericardiocentesis.   Patient underwent pericardiocentesis 6/21; during the procedure, patient experienced vagal inhibition on advancement of the wire and drainage tube and went into PEA arrest requiring 3-4 minutes CPR, Epi x 1 and atropine x 1 with ROSC. She remained intubated post-procedure in the setting of arrest and was transferred to the CVICU.  Palliative care was consulted to start the conversation of goals of care for Ms. Bayle  Clinical Assessment/Goals of Care:  *Please note that this is a verbal dictation therefore any spelling or grammatical errors are due to the "[place] One" system interpretation.  I have reviewed medical records including EPIC notes, labs and imaging, received report from bedside RN, assessed the patient who was lying in bed in no acute distress.   I met with [name] to further discuss diagnosis prognosis, GOC, EOL wishes, disposition and options.   I introduced Palliative Medicine as specialized medical care for people living with serious illness. It focuses on providing relief from the symptoms and stress of a serious illness. The goal is to improve quality of life for both the patient and the family.  Taimi shares with me that she is from [place], [place].  She has lived in [place] throughout the duration of her life.  She never had any children though her sister had 46 of which were heavily involved in her life.  She worked in the [employer] for 21 years and in the [employer] for 20 years  thereafter.  She shares that in terms of things which are important to her "everything is important to her".  She is a woman of faith and practices within the [employer].  Prior to hospitalization Adya had been living with her niece [name]  She had been able to aid in all personal care matters without assistance and utilizes a walker for mobility.  I asked Jeneen what she understood about her present medical conditions.  She shares with me that she has had non-small cell lung cancer for the past few years which she has been receiving treatment for.  She shares that she has had 2 thoracentesis in the past and recently had "fluid around her heart" requiring intervention.  We reviewed that she suffered a PEA arrest meaning her heart stopped.  Leela shares that she is aware and glad that she is still with [place] though she is "ready when Jesus is ready to take her".  A detailed discussion was had today regarding advanced directives - was able to introduce and review advanced directives with Aislyn so that she may designate a healthcare power of attorney.  She shares with me that she would wish for her niece [name] to be her primary decision maker should for any reason she be incapacitated.    Concepts specific to code status, artifical feeding and hydration, continued IV antibiotics and rehospitalization was had.  Bettyann is very clear that she would wish for "everything to be done" to maintain and prolong life.  She is even open to measures such as a tracheostomy and PEG tube.  She values life and believes  maintaining life is of the utmost importance.  Reviewed what quality of my life may look like in terms of day-to-day struggles living in an LTAC inclusive of frequent infections, pressure injuries, recurrent rehospitalization's, etc.   Despite this Melia is very clear that she would again want all measures to prolong her life.  Discussed the importance of continued conversation with family and their  medical  providers regarding overall plan of care and treatment options, ensuring decisions are within the context of the patients values and GOCs.  Provided "Hard Choices for [employer]" booklet.   Decision Maker: [name] (Niece) [phone]  SUMMARY OF RECOMMENDATIONS   Full code/full scope of care  Continue current measures to maintain life  Spiritual support further help with advanced directives  Ongoing incremental palliative care support  Code Status/Advance Care Planning: FULL CODE    Palliative Prophylaxis:  Oral care, mobility, delirium  Additional Recommendations (Limitations, Scope, Preferences): Continue full scope of care    Psycho-social/Spiritual:  Desire for further Chaplaincy support: Patient is a member of [employer] Additional Recommendations: Education on chronic comorbidities and cancer progression   Prognosis: Unclear at this time  Discharge Planning: Discharge plan recommendation is skilled nursing  Vitals:   08/10/20 1500 08/10/20 1600  BP:    Pulse: 89 96  Resp: (!) 26 (!) 22  Temp:  98.4 F (36.9 C)  SpO2: 96% 96%    Intake/Output Summary (Last 24 hours) at 08/10/2020 1715 Last data filed at 08/10/2020 1300 Gross per 24 hour  Intake 365.25 ml  Output 415 ml  Net -49.75 ml   Last Weight  Most recent update: 08/09/2020  3:38 AM    Weight  62.8 kg (138 lb 7.2 oz)            Gen:  Elderly AA F in NAD HEENT: moist mucous membranes CV: Regular rate and rhythm  PULM: On 4LPM [place]tt ABD: soft/nontender  EXT: No edema  Neuro: Alert and oriented x3   PPS: 50-60%   This conversation/these recommendations were discussed with patient primary care team, Dr. Lynetta Mare  Time In: 1500 Time Out: 1610 Total Time: 70 Greater than 50%  of this time was spent counseling and coordinating care related to the above assessment and plan.  [place] Team Team Cell Phone: [phone] Please utilize secure  chat with additional questions, if there is no response within 30 minutes please call the above phone number  Palliative Medicine Team providers are available by phone from 7am to 7pm daily and can be reached through the team cell phone.  Should this patient require assistance outside of these hours, please call the patient's attending physician.

## 2020-08-10 NOTE — Progress Notes (Signed)
SLP Cancellation Note  Patient Details Name: Sara Ferguson MRN: [id number] DOB: 01/25/1946   Cancelled treatment:       Reason Eval/Treat Not Completed: SLP screened. Per chart, pt has passed a [facility] swallow screen. RN notes no difficulties on current diet. SLP will hold eval for now - please reorder with any acute changes.     Osie Bond., M.A. [place] Acute Rehabilitation Services Pager [phone] Office [phone]  08/10/2020, 10:55 AM

## 2020-08-10 NOTE — Evaluation (Addendum)
Occupational Therapy Evaluation Patient Details Name: Sara Ferguson MRN: [id number] DOB: 1945-04-19 Today's Date: 08/10/2020    History of Present Illness Pt is a 75 y/o female presenting on 6/19 to ER with 1 week of progressive shortness of breath, LE edema, and chest discomfort.  Pt currently on oral chemotherapy. CT chest shows left breast heterogeneous density with skin thickening concerning for carcinoma, left pericardial effusion  and left greater than right heterogeneous interstitial and airspace opacity possibly pulmonary edema.  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  Also left adrenal mass concerning for metastatic disease. Pt admitted for pericardial effusion. Pt underwent pericardiocentesis for pericardial effusion with PEA arrest (3-4 min CPR). Pt extubated 6/23. PMH: non-small cell lung cancer stage IV, recurrent malignant pleural effusion, diabetes, essential hypertension, osteoarthritis.   Clinical Impression   PTA, pt lives with niece and reports Modified Independence with ADLs, some IADLs and mobility using cane intermittently. Pt presents now with diagnoses above and deficits noted below (see OT problem list). Pt overall Mod A for basic transfers using RW, difficulty taking steps due to reported new B foot numbness. Pt requires Supervision for UB ADLs and Max A for LB ADLs due to deficits. Began energy conservation strategies with fair carryover to maintain O2 sats. Due to pt below functional baseline and at increased risk for falls (limiting ability to navigate to second floor at home safely), recommend SNF for short term rehab. Will continue to follow acutely and update recommendations as appropriate.  BP pre-activity: 133/50 (71) BP post-activity: 131/49 (73) SpO2 on 2 L O2 92-93% SpO2 with RA trial: down to 83%  HR up to 110bpm    Follow Up Recommendations  SNF;Supervision/Assistance - 24 hour    Equipment Recommendations  3 in 1  bedside commode;Other (comment) (rolling walker; to be continually assessed)    Recommendations for Other Services Other (comment) (palliative consult)     Precautions / Restrictions Precautions Precautions: Fall;Other (comment) Precaution Comments: monitor O2, pericardial drain Restrictions Weight Bearing Restrictions: No      Mobility Bed Mobility Overal bed mobility: Needs Assistance Bed Mobility: Supine to Sit     Supine to sit: Supervision;HOB elevated     General bed mobility comments: increased time/effort    Transfers Overall transfer level: Needs assistance Equipment used: Rolling walker (2 wheeled) Transfers: Sit to/from [facility] Sit to Stand: Mod assist Stand pivot transfers: Mod assist       General transfer comment: Mod A for power up, cues for hand placement. Due to B foot numbness, difficulty safely stepping to chair without Mod A to maintain balance and manuver RW    Balance Overall balance assessment: Needs assistance Sitting-balance support: No upper extremity supported;Feet supported Sitting balance-Leahy Scale: Fair     Standing balance support: Bilateral upper extremity supported;During functional activity Standing balance-Leahy Scale: Poor Standing balance comment: reliant on UE support + external support                           ADL either performed or assessed with clinical judgement   ADL Overall ADL's : Needs assistance/impaired Eating/Feeding: Set up;Sitting   Grooming: Set up;Sitting;Wash/dry face Grooming Details (indicate cue type and reason): to wash face seated in recliner Upper Body Bathing: Supervision/ safety;Sitting   Lower Body Bathing: Maximal assistance;Sit to/from stand   Upper Body Dressing : Supervision/safety;Sitting   Lower Body Dressing: Maximal assistance;Sit to/from stand Lower Body Dressing Details (  indicate cue type and reason): Max A to don smaller socks, unable to safely  reach to feet Toilet Transfer: Moderate assistance;Stand-pivot;RW Toilet Transfer Details (indicate cue type and reason): simulated to recliner Toileting- Clothing Manipulation and Hygiene: Maximal assistance;Sit to/from stand         General ADL Comments: Limited by decreased balance, pulmonary tolerance and new reports of numbness on bottom of feet.     Vision Patient Visual Report: No change from baseline Vision Assessment?: No apparent visual deficits     Perception     Praxis      Pertinent Vitals/Pain Pain Assessment: Faces Faces Pain Scale: Hurts a little bit Pain Location: chest with deep breaths Pain Descriptors / Indicators: Sore Pain Intervention(s): Monitored during session     Hand Dominance Right   Extremity/Trunk Assessment Upper Extremity Assessment Upper Extremity Assessment: Generalized weakness   Lower Extremity Assessment Lower Extremity Assessment: Defer to PT evaluation   Cervical / Trunk Assessment Cervical / Trunk Assessment: Kyphotic   Communication Communication Communication: No difficulties   Cognition Arousal/Alertness: Awake/alert Behavior During Therapy: Flat affect Overall Cognitive Status: No family/caregiver present to determine baseline cognitive functioning Area of Impairment: Memory;Following commands;Safety/judgement;Awareness;Problem solving                     Memory: Decreased short-term memory Following Commands: Follows one step commands with increased time Safety/Judgement: Decreased awareness of deficits;Decreased awareness of safety Awareness: Emergent Problem Solving: Slow processing;Requires verbal cues General Comments: Pt pleasant and cooperative. some decreased awareness of deficits (did opt to use RW, but later reports she will likely not need assist once at home despite the physical assist needed today). Mild memory deficits noted - forgot oxygen was in nose, etc.   General Comments       Exercises      Shoulder Instructions      Home Living Family/patient expects to be discharged to:: Private residence Living Arrangements: Other (Comment) (niece) Available Help at Discharge: Family;Available 24 hours/day (niece works from home) Type of Home: House Home Access: Level entry     [place]: Two level (bedroom upstairs, but has bathroom downstairs) Alternate Level Stairs-Number of Steps: flight   Bathroom Shower/Tub: Teacher, early years/pre: Standard Bathroom Accessibility: Yes   Home Equipment: Cane - single point          Prior Functioning/Environment Level of Independence: Needs assistance  Gait / Transfers Assistance Needed: uses SPC occasionally for mobility, denies any falls ADL's / Homemaking Assistance Needed: Able to complete ADLs, vacuuming and cooking. Niece completes laundry            OT Problem List: Decreased strength;Decreased activity tolerance;Impaired balance (sitting and/or standing);Decreased cognition;Decreased safety awareness;Decreased knowledge of use of DME or AE;Cardiopulmonary status limiting activity      OT Treatment/Interventions: Self-care/ADL training;Therapeutic exercise;Energy conservation;DME and/or AE instruction;Therapeutic activities;Patient/family education;Balance training    OT Goals(Current goals can be found in the care plan section) Acute Rehab OT Goals Patient Stated Goal: get out of bed, get better OT Goal Formulation: With patient Time For Goal Achievement: 08/24/20 Potential to Achieve Goals: Good ADL Goals Pt Will Perform Lower Body Bathing: with min assist;sit to/from stand;sitting/lateral leans Pt Will Perform Lower Body Dressing: with min assist;sitting/lateral leans;sit to/from stand Pt Will Transfer to Toilet: ambulating;with min assist Pt Will Perform Toileting - Clothing Manipulation and hygiene: with min assist;sitting/lateral leans;sit to/from stand Additional ADL Goal #1: Pt to verbalize at least  3 energy conservation to  implement during ADLs/mobility  OT Frequency: Min 2X/week   Barriers to D/C:            Co-evaluation              AM-PAC OT "6 Clicks" Daily Activity     Outcome Measure Help from another person eating meals?: A Little Help from another person taking care of personal grooming?: A Little Help from another person toileting, which includes using toliet, bedpan, or urinal?: A Lot Help from another person bathing (including washing, rinsing, drying)?: A Lot Help from another person to put on and taking off regular upper body clothing?: A Little Help from another person to put on and taking off regular lower body clothing?: A Lot 6 Click Score: 15   End of Session Equipment Utilized During Treatment: Gait belt;Rolling walker;Oxygen Nurse Communication: Mobility status;Other (comment) (vitals)  Activity Tolerance: Patient tolerated treatment well Patient left: in chair;with call bell/phone within reach  OT Visit Diagnosis: Unsteadiness on feet (R26.81);Other abnormalities of gait and mobility (R26.89);Muscle weakness (generalized) (M62.81)                Time: 0786-7544 OT Time Calculation (min): 30 min Charges:  OT General Charges $OT Visit: 1 Visit OT Evaluation $OT Eval Moderate Complexity: 1 Mod OT Treatments $Therapeutic Activity: 8-22 mins  Malachy Chamber, OTR/L Acute Rehab Services Office: [phone]   Layla Maw 08/10/2020, 9:28 AM

## 2020-08-10 NOTE — Progress Notes (Signed)
  Echocardiogram 2D Echocardiogram has been performed.  Sara Ferguson 08/10/2020, 11:45 AM

## 2020-08-10 NOTE — Consult Note (Signed)
Ref: [name], MD   Subjective:  Feeling better but has increased cough and sputum. Decreasing fluid from pericardial drain.  Objective:  Vital Signs in the last 24 hours: Temp:  [97.6 F (36.4 C)-98.6 F (37 C)] 97.6 F (36.4 C) (06/24 1100) Pulse Rate:  [81-112] 87 (06/24 1100) Cardiac Rhythm: Sinus tachycardia (06/24 0800) Resp:  [16-33] 18 (06/24 1100) BP: (95-133)/(46-81) 129/56 (06/24 1100) SpO2:  [90 %-100 %] 96 % (06/24 1100)  Physical Exam: BP Readings from Last 1 Encounters:  08/10/20 (!) 129/56     Wt Readings from Last 1 Encounters:  08/09/20 62.8 kg    Weight change:  Body mass index is 23.76 kg/m. HEENT: [place]/AT, Eyes-Brown, Conjunctiva-Pink, Sclera-Non-icteric Neck: No JVD, No bruit, Trachea midline. Lungs:  Clearing, Bilateral. Cardiac:  Regular rhythm, normal S1 and S2, no S3. II/VI systolic murmur. Abdomen:  Soft, non-tender. BS present. Extremities:  No edema present. No cyanosis. No clubbing. CNS: AxOx3, Cranial nerves grossly intact, moves all 4 extremities.  Skin: Warm and dry.   Intake/Output from previous day: 06/23 0701 - 06/24 0700 In: 476.5 [I.V.:184.8; NG/GT:80; IV Piggyback:211.7] Out: 675 [Urine:575; Drains:100]    Lab Results: BMET    Component Value Date/Time   NA 135 08/10/2020 0045   NA 139 08/09/2020 0647   NA 140 08/07/2020 2009   K 4.0 08/10/2020 0045   K 3.9 08/09/2020 0647   K 4.4 08/07/2020 2009   CL 103 08/10/2020 0045   CL 108 08/09/2020 0647   CL 106 08/07/2020 1640   CO2 22 08/10/2020 0045   CO2 18 (L) 08/09/2020 0647   CO2 26 08/07/2020 0246   GLUCOSE 160 (H) 08/10/2020 0045   GLUCOSE 96 08/09/2020 0647   GLUCOSE 170 (H) 08/07/2020 1640   BUN 47 (H) 08/10/2020 0045   BUN 33 (H) 08/09/2020 0647   BUN 22 08/07/2020 1640   CREATININE 1.67 (H) 08/10/2020 0045   CREATININE 1.84 (H) 08/09/2020 0647   CREATININE 1.00 08/07/2020 1640   CREATININE 0.79 04/26/2020 1513   CREATININE 0.81 03/29/2020 1621    CREATININE 0.77 03/01/2020 1623   CALCIUM 8.8 (L) 08/10/2020 0045   CALCIUM 8.7 (L) 08/09/2020 0647   CALCIUM 9.0 08/07/2020 0246   GFRNONAA 31 (L) 08/10/2020 0045   GFRNONAA 28 (L) 08/09/2020 0647   GFRNONAA >60 08/07/2020 0246   GFRNONAA >60 04/26/2020 1513   GFRNONAA >60 03/29/2020 1621   GFRNONAA >60 03/01/2020 1623   CBC    Component Value Date/Time   WBC 10.5 08/10/2020 0045   RBC 4.01 08/10/2020 0045   HGB 11.6 (L) 08/10/2020 0045   HCT 36.0 08/10/2020 0045   PLT 190 08/10/2020 0045   MCV 89.8 08/10/2020 0045   MCH 28.9 08/10/2020 0045   MCHC 32.2 08/10/2020 0045   RDW 13.6 08/10/2020 0045   LYMPHSABS 0.9 08/09/2020 0647   MONOABS 1.0 08/09/2020 0647   EOSABS 0.0 08/09/2020 0647   BASOSABS 0.0 08/09/2020 0647   HEPATIC Function Panel Recent Labs    05/24/20 1517 08/06/20 0330 08/09/20 0647  PROT 8.7* 7.4 6.5   HEMOGLOBIN A1C No components found for: HGA1C,  MPG CARDIAC ENZYMES No results found for: CKTOTAL, CKMB, CKMBINDEX, TROPONINI BNP No results for input(s): PROBNP in the last 8760 hours. TSH No results for input(s): TSH in the last 8760 hours. CHOLESTEROL No results for input(s): CHOL in the last 8760 hours.  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   LOS: 5 days   Time spent including chart review, lab review, examination, discussion with patient/Nurse : 30 min   Dixie Dials  MD  08/10/2020, 2:24 PM

## 2020-08-10 NOTE — Progress Notes (Signed)
I followed up on Sara Ferguson's path going [facility]. I was updated by pathology team that it went out today.

## 2020-08-10 NOTE — Evaluation (Signed)
Physical Therapy Evaluation Patient Details Name: Sara Ferguson MRN: [id number] DOB: 10/24/1945 Today's Date: 08/10/2020   History of Present Illness  Pt is a 75 y/o female presenting on 6/19 to ER with 1 week of progressive shortness of breath, LE edema, and chest discomfort.  Pt currently on oral chemotherapy. CT chest shows left breast heterogeneous density with skin thickening concerning for carcinoma, left pericardial effusion  and left greater than right heterogeneous interstitial and airspace opacity possibly pulmonary edema.  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  Also left adrenal mass concerning for metastatic disease. Pt admitted for pericardial effusion. Pt underwent pericardiocentesis for pericardial effusion with PEA arrest (3-4 min CPR). Pt extubated 6/23. PMH: non-small cell lung cancer stage IV, recurrent malignant pleural effusion, diabetes, essential hypertension, osteoarthritis.  Clinical Impression  Pt with generalized weakness and deficits in activity tolerance, functional mobility, balance, gait, and cognition. Pt tolerates transfers and ambulation of very limited household distances, requiring physical assistance to perform. Pt requires multimodal cues for hand placement during transfers and device management with gait. Pt is concerning for risk of falls due to impairments and balance and safety awareness. Pt will benefit from acute PT to improve safety and independence in mobility. SPT recommends SNF placement for strengthening and return to prior level. If pt refuses SNF, HHPT with supervision for all OOB mobility.     Follow Up Recommendations SNF;Supervision for mobility/OOB    Equipment Recommendations  3in1 (PT);Wheelchair (measurements PT);Wheelchair cushion (measurements PT)    Recommendations for Other Services       Precautions / Restrictions Precautions Precautions: Fall;Other (comment) Precaution Comments: monitor O2,  pericardial drain Restrictions Weight Bearing Restrictions: No      Mobility  Bed Mobility Overal bed mobility: Needs Assistance Bed Mobility: Supine to Sit     Supine to sit: Supervision;HOB elevated     General bed mobility comments: received in recliner    Transfers Overall transfer level: Needs assistance Equipment used: Rolling walker (2 wheeled) Transfers: Sit to/from Stand Sit to Stand: Mod assist Stand pivot transfers: Mod assist       General transfer comment: verbal and tactile cues for hand placement. Pt attempting to stand by pulling on walker. Mod A to power to stand  Ambulation/Gait Ambulation/Gait assistance: Min assist Gait Distance (Feet): 10 Feet Assistive device: Rolling walker (2 wheeled) Gait Pattern/deviations: Shuffle;Decreased stride length;Leaning posteriorly Gait velocity: reduced Gait velocity interpretation: <1.8 ft/sec, indicate of risk for recurrent falls General Gait Details: Pt with slow shuffling steps with posterior lean and demonstrating instability.  Stairs            Wheelchair Mobility    Modified Rankin (Stroke Patients Only)       Balance Overall balance assessment: Needs assistance Sitting-balance support: No upper extremity supported;Feet supported Sitting balance-Leahy Scale: Fair     Standing balance support: Bilateral upper extremity supported;During functional activity Standing balance-Leahy Scale: Poor Standing balance comment: reliant on UE and external support                             Pertinent Vitals/Pain Pain Assessment: 0-10 Pain Score: 5  Faces Pain Scale: Hurts a little bit Pain Location: chest Pain Descriptors / Indicators: Sore Pain Intervention(s): Monitored during session;Limited activity within patient's tolerance    Home Living Family/patient expects to be discharged to:: Private residence Living Arrangements: Other relatives (neice) Available Help at Discharge:  Family;Available 24 hours/day (  neice works from home) Type of Home: House Home Access: Level entry     [place]: Two level (bedroom 2nd floor) Home Equipment: [place] - single point      Prior Function Level of Independence: Independent with assistive device(s)   Gait / Transfers Assistance Needed: pt reports using SPC at times  ADL's / Homemaking Assistance Needed: pt reports independence with ADLs        Hand Dominance   Dominant Hand: Right    Extremity/Trunk Assessment   Upper Extremity Assessment Upper Extremity Assessment: Defer to OT evaluation    Lower Extremity Assessment Lower Extremity Assessment: Generalized weakness    Cervical / Trunk Assessment Cervical / Trunk Assessment: Kyphotic  Communication   Communication: No difficulties  Cognition Arousal/Alertness: Awake/alert Behavior During Therapy: Flat affect Overall Cognitive Status: No family/caregiver present to determine baseline cognitive functioning Area of Impairment: Memory;Following commands;Safety/judgement;Awareness;Problem solving                     Memory: Decreased short-term memory Following Commands: Follows one step commands with increased time Safety/Judgement: Decreased awareness of deficits;Decreased awareness of safety Awareness: Emergent Problem Solving: Requires tactile cues;Requires verbal cues;Slow processing General Comments: Pt states that she is at [place] [facility] and that it is May. Pt able to recall year.      General Comments General comments (skin integrity, edema, etc.): Pt initally desatting to 88% on RA, placed on 2 L with oxygen sat levels at or above 92%.    Exercises     Assessment/Plan    PT Assessment Patient needs continued PT services  PT Problem List Decreased strength;Decreased activity tolerance;Decreased balance;Decreased mobility;Decreased knowledge of use of DME;Decreased cognition;Decreased safety awareness;Decreased knowledge of  precautions;Cardiopulmonary status limiting activity;Pain       PT Treatment Interventions DME instruction;Gait training;Functional mobility training;Therapeutic activities;Therapeutic exercise;Balance training;Patient/family education    PT Goals (Current goals can be found in the Care Plan section)  Acute Rehab PT Goals Patient Stated Goal: get stronger and go home PT Goal Formulation: With patient Time For Goal Achievement: 08/24/20 Potential to Achieve Goals: Good    Frequency Min 2X/week   Barriers to discharge        Co-evaluation               AM-PAC PT "6 Clicks" Mobility  Outcome Measure Help needed turning from your back to your side while in a flat bed without using bedrails?: None Help needed moving from lying on your back to sitting on the side of a flat bed without using bedrails?: A Little Help needed moving to and from a bed to a chair (including a wheelchair)?: A Lot Help needed standing up from a chair using your arms (e.g., wheelchair or bedside chair)?: A Lot Help needed to walk in hospital room?: A Little Help needed climbing 3-5 steps with a railing? : A Lot 6 Click Score: 16    End of Session Equipment Utilized During Treatment: Gait belt;Oxygen Activity Tolerance: Patient limited by fatigue Patient left: in chair;with chair alarm set;with call bell/phone within reach Nurse Communication: Mobility status PT Visit Diagnosis: Unsteadiness on feet (R26.81);Other abnormalities of gait and mobility (R26.89);Muscle weakness (generalized) (M62.81);Pain Pain - Right/Left:  (chest) Pain - part of body:  (chest)    Time: 2330-0762 PT Time Calculation (min) (ACUTE ONLY): 28 min   Charges:   PT Evaluation $PT Eval Low Complexity: 1 Low          Acute Rehab  Pager: [phone]   [name], SPT  08/10/2020, 11:19 AM

## 2020-08-10 NOTE — Plan of Care (Signed)
  Problem: Education: Goal: Knowledge of General Education information will improve Description: Including pain rating scale, medication(s)/side effects and non-pharmacologic comfort measures Outcome: Progressing   Problem: Health Behavior/Discharge Planning: Goal: Ability to manage health-related needs will improve Outcome: Progressing   Problem: Clinical Measurements: Goal: Ability to maintain clinical measurements within normal limits will improve Outcome: Progressing Goal: Will remain free from infection Outcome: Progressing Goal: Diagnostic test results will improve Outcome: Progressing Goal: Respiratory complications will improve Outcome: Progressing Goal: Cardiovascular complication will be avoided Outcome: Progressing   Problem: Nutrition: Goal: Adequate nutrition will be maintained Outcome: Progressing   Problem: Coping: Goal: Level of anxiety will decrease Outcome: Progressing   Problem: Elimination: Goal: Will not experience complications related to bowel motility Outcome: Progressing   Problem: Pain Managment: Goal: General experience of comfort will improve Outcome: Progressing   Problem: Safety: Goal: Ability to remain free from injury will improve Outcome: Progressing

## 2020-08-11 ENCOUNTER — Inpatient Hospital Stay (HOSPITAL_COMMUNITY): Payer: Medicare Other

## 2020-08-11 DIAGNOSIS — J9621 Acute and chronic respiratory failure with hypoxia: Secondary | ICD-10-CM

## 2020-08-11 LAB — CULTURE, BLOOD (ROUTINE X 2)
Culture: NO GROWTH
Culture: NO GROWTH
Special Requests: ADEQUATE

## 2020-08-11 LAB — BASIC METABOLIC PANEL
Anion gap: 9 (ref 5–15)
BUN: 46 mg/dL — ABNORMAL HIGH (ref 8–23)
CO2: 24 mmol/L (ref 22–32)
Calcium: 8.7 mg/dL — ABNORMAL LOW (ref 8.9–10.3)
Chloride: 98 mmol/L (ref 98–111)
Creatinine, Ser: 1.1 mg/dL — ABNORMAL HIGH (ref 0.44–1.00)
GFR, Estimated: 52 mL/min — ABNORMAL LOW (ref >60)
Glucose, Bld: 145 mg/dL — ABNORMAL HIGH (ref 70–99)
Potassium: 4 mmol/L (ref 3.5–5.1)
Sodium: 131 mmol/L — ABNORMAL LOW (ref 135–145)

## 2020-08-11 LAB — GLUCOSE, CAPILLARY
Glucose-Capillary: 114 mg/dL — ABNORMAL HIGH (ref 70–99)
Glucose-Capillary: 125 mg/dL — ABNORMAL HIGH (ref 70–99)
Glucose-Capillary: 198 mg/dL — ABNORMAL HIGH (ref 70–99)
Glucose-Capillary: 175 mg/dL — ABNORMAL HIGH (ref 70–99)

## 2020-08-11 LAB — CULTURE, RESPIRATORY W GRAM STAIN: Culture: NORMAL

## 2020-08-11 MED ORDER — COLCHICINE 0.6 MG PO TABS
0.6000 mg | ORAL_TABLET | Freq: Every day | ORAL | Status: DC
  Administered 2020-08-14: 0.6 mg via ORAL
  Filled 2020-08-11 (×3): qty 1

## 2020-08-11 NOTE — Progress Notes (Signed)
   Palliative Medicine Inpatient Follow Up Note  Reason for consult:  "Stage IV lung cancer on treatment but frail"   HPI:  Per intake H&P --> 75 year old woman with PMHx significant for HTN, T2DM and stage IV NSCLC (c/b recurrent malignant pleural effusion, on oral chemotherapy) who presented to [facility] 6/19 for SOB, increased LE edema and chest discomfort. CT Chest completed demonstrating large pericardial effusion. Transferred to [facility] for pericardiocentesis.   Patient underwent pericardiocentesis 6/21; during the procedure, patient experienced vagal inhibition on advancement of the wire and drainage tube and went into PEA arrest requiring 3-4 minutes CPR, Epi x 1 and atropine x 1 with ROSC. She remained intubated post-procedure in the setting of arrest and was transferred to the CVICU.   Palliative care was consulted to start the conversation of goals of care for [name]  Today's Discussion (08/11/2020):  *Please note that this is a verbal dictation therefore any spelling or grammatical errors are due to the "[place]" system interpretation.  Chart reviewed.   I checked in on [name] this afternoon.  She shares with me that she has been feeling down throughout the day in the setting of receiving "bad news".    We reviewed the conversation she had had with Dr. Lake Bells this morning and the reality that there is concern that her left lower lobe mass is increasing.  Discussed that additional studies are being done to help identify if this is the case or not.  Reviewed that she had received a thoracentesis this morning and the potential moving forward for a bronchoscopy.   Reviewed the plan for oncology to provide input once additional studies are complete.  Cylie shares that although she is "feeling down" she will be "feeling better" tomorrow.  Offered support.  Questions and concerns addressed   Objective Assessment: Vital Signs Vitals:   08/11/20 1200 08/11/20 1300  BP:  98/86 (!) 138/50  Pulse: 92 89  Resp: (!) 27 (!) 22  Temp:    SpO2: 97% 95%    Intake/Output Summary (Last 24 hours) at 08/11/2020 1345 Last data filed at 08/11/2020 1300 Gross per 24 hour  Intake 240 ml  Output 645 ml  Net -405 ml   Last Weight  Most recent update: 08/09/2020  3:38 AM    Weight  62.8 kg (138 lb 7.2 oz)            Gen:  Elderly AA F in NAD HEENT: moist mucous membranes CV: Regular rate and rhythm PULM: On 4LPM [place] ABD: soft/nontender EXT: No edema Neuro: Alert and oriented x3  SUMMARY OF RECOMMENDATIONS   Full code/Full scope of care   Will need to see what additional wu / studies reveal   If needed we are happy to coordinate a family meeting pending additional information as above   Spiritual support further help with advanced directives   Ongoing incremental palliative care support  Time Spent: 25 Greater than 50% of the time was spent in counseling and coordination of care ______________________________________________________________________________________ [place] Team Team Cell Phone: [phone] Please utilize secure chat with additional questions, if there is no response within 30 minutes please call the above phone number  Palliative Medicine Team providers are available by phone from 7am to 7pm daily and can be reached through the team cell phone.  Should this patient require assistance outside of these hours, please call the patient's attending physician.

## 2020-08-11 NOTE — Progress Notes (Signed)
NAME:  Sara Ferguson, MRN:  [id number], DOB:  Jun 28, 1945, LOS: 6 ADMISSION DATE:  08/05/2020, CONSULTATION DATE:  6/19 REFERRING MD:  Dwyane Dee, CHIEF COMPLAINT:  PEA arrest,   History of Present Illness:  74 year old woman with PMHx significant for HTN, T2DM and stage IV NSCLC (c/b recurrent malignant pleural effusion, on oral chemotherapy) who presented to [facility] 6/19 for SOB, increased LE edema and chest discomfort. CT Chest completed demonstrating large pericardial effusion. Transferred to [facility] for pericardiocentesis.   Patient underwent pericardiocentesis 6/21; during the procedure, patient experienced vagal inhibition on advancement of the wire and drainage tube and went into PEA arrest requiring 3-4 minutes CPR, Epi x 1 and atropine x 1 with ROSC. She remained intubated post-procedure in the setting of arrest and was transferred to the CVICU.   PCCM consulted for ventilator management s/p PEA arrest.  Pertinent  Medical History  DM2 Stage IV lung cancer Hypertension   Significant Hospital Events: Including procedures, antibiotic start and stop dates in addition to other pertinent events   6/21 - Underwent pericardiocentesis for moderate pericardial effusion; vagal inhibition with PEA arrest (3-4 min CPR, Epi x 1 and Atropine x 1) with ROSC, remained intubated post-arrest. PCCM consulted. 6/22 - Hypoventilation with small Vt on weaning. 6/22 - Echo shows no residual effusion and normal LV function but ++ LVH 6/23-extubated. 6/24-reports symptoms much improved.  Repeat echocardiogram shows trace pericardial effusion; palliative visiti> full code  Interim History / Subjective:  Says breathing has improved No complaints Notes several months of dyspnea and weight loss  Objective   Blood pressure (!) 124/52, pulse 91, temperature 98.2 F (36.8 C), temperature source Oral, resp. rate 17, height 5\' 4"  (1.626 m), weight 62.8 kg, SpO2 100 %.        Intake/Output Summary (Last 24 hours) at  08/11/2020 5427 Last data filed at 08/11/2020 0000 Gross per 24 hour  Intake 325 ml  Output 255 ml  Net 70 ml   Filed Weights   08/07/20 0427 08/09/20 0335  Weight: 60.5 kg 62.8 kg    Examination: General:  Frail, resting comfortably in chair HENT: NCAT OP clear PULM: Diminished left lung B, normal effort CV: RRR, no mgr; pericardial drain in place GI: BS+, soft, nontender MSK: diminished bulk and tone Neuro: awake, alert, no distress, MAEW   Labs/imaging that I have personally reviewed  (right click and "Reselect all SmartList Selections" daily)  CT chest from 6/19> heterogenous consolidation vs mass left lower lobe with occlusion of left lower lobe bronchus, upper lobe consolidation, and small effusion  12/2019 left pleural fluid cytology> nsclc 08/07/2020 pericardial fluid cytology > neg for malignancy  Resolved Hospital Problem list     Assessment & Plan:  Pericardial effusion Per cardiology  Acute hypoxemic respiratory failure with left effusion Left lower lobe consolidation is most worrisome for a mass/recurrent malignancy> would benefit from bronchoscopy but too sick; will start with a thoracentesis today, though I don't think this will give symptomatic relief > thoracentesis today, consider pleurex > consider bronchoscopy  Global: big picture, if what we are seeing in the left lower lobe is mass increasing in size causing this degree of symptomatic burden we need input from Onc soon.  May need to consider hospice.  Best Practice (right click and "Reselect all SmartList Selections" daily)   Per TR[facility]   Labs   CBC: Recent Labs  Lab 08/05/20 2000 08/06/20 0330 08/07/20 0246 08/07/20 1639 08/07/20 1809 08/07/20 2009 08/08/20 0300 08/09/20 0623 08/10/20 0045  WBC 4.6 5.7 4.6  --   --   --  7.0 9.8 10.5  NEUTROABS 3.3  --   --   --   --   --   --  7.9*  --   HGB 13.3 11.6* 11.1*   < > 10.9* 11.2* 10.4* 12.0 11.6*  HCT 40.7 36.2 35.3*   < > 32.0* 33.0*  32.2* 36.9 36.0  MCV 91.1 92.1 91.5  --   --   --  90.7 90.0 89.8  PLT 231 245 216  --   --   --  211 201 190   < > = values in this interval not displayed.    Basic Metabolic Panel: Recent Labs  Lab 08/06/20 0330 08/07/20 0246 08/07/20 1639 08/07/20 1640 08/07/20 1809 08/07/20 2009 08/09/20 0647 08/10/20 0045 08/11/20 0232  NA 139 135   < > 141 140 140 139 135 131*  K 4.3 3.9   < > 4.3 4.3 4.4 3.9 4.0 4.0  CL 106 102  --  106  --   --  108 103 98  CO2 27 26  --   --   --   --  18* 22 24  GLUCOSE 123* 149*  --  170*  --   --  96 160* 145*  BUN 16 16  --  22  --   --  33* 47* 46*  CREATININE 0.82 0.85  --  1.00  --   --  1.84* 1.67* 1.10*  CALCIUM 8.9 9.0  --   --   --   --  8.7* 8.8* 8.7*  MG  --  2.0  --   --   --   --   --  1.9  --   PHOS  --  3.6  --   --   --   --   --  4.3  --    < > = values in this interval not displayed.   GFR: Estimated Creatinine Clearance: 37 mL/min (A) (by C-G formula based on SCr of 1.1 mg/dL (H)). Recent Labs  Lab 08/05/20 2000 08/06/20 0330 08/07/20 0246 08/08/20 0300 08/09/20 0647 08/10/20 0045  WBC 4.6   < > 4.6 7.0 9.8 10.5  LATICACIDVEN 1.3  --   --   --   --   --    < > = values in this interval not displayed.    Liver Function Tests: Recent Labs  Lab 08/06/20 0330 08/09/20 0647  AST 29 45*  ALT 19 31  ALKPHOS 85 76  BILITOT 0.7 2.1*  PROT 7.4 6.5  ALBUMIN 3.2* 2.4*   No results for input(s): LIPASE, AMYLASE in the last 168 hours. No results for input(s): AMMONIA in the last 168 hours.  ABG    Component Value Date/Time   PHART 7.440 08/07/2020 2009   PCO2ART 32.4 08/07/2020 2009   PO2ART 77 (L) 08/07/2020 2009   HCO3 22.1 08/07/2020 2009   TCO2 23 08/07/2020 2009   ACIDBASEDEF 2.0 08/07/2020 2009   O2SAT 96.0 08/07/2020 2009     Coagulation Profile: No results for input(s): INR, PROTIME in the last 168 hours.  Cardiac Enzymes: No results for input(s): CKTOTAL, CKMB, CKMBINDEX, TROPONINI in the last 168  hours.  HbA1C: Hgb A1c MFr Bld  Date/Time Value Ref Range Status  08/06/2020 03:30 AM 6.3 (H) 4.8 - 5.6 % Final    Comment:    (NOTE)         Prediabetes: 5.7 -  6.4         Diabetes: >6.4         Glycemic control for adults with diabetes: <7.0   12/30/2019 11:18 AM 10.7 (H) 4.8 - 5.6 % Final    Comment:    (NOTE) Pre diabetes:          5.7%-6.4%  Diabetes:              >6.4%  Glycemic control for   <7.0% adults with diabetes     CBG: Recent Labs  Lab 08/10/20 0648 08/10/20 1138 08/10/20 1616 08/10/20 2119 08/11/20 0719  GLUCAP 104* 163* 171* 197* 114*    Critical care time: n/a    Roselie Awkward, MD [place] [facility] Pager: [phone] Cell: [phone] If no response, please call [phone] until 7pm After 7:00 pm call Elink  [phone]

## 2020-08-11 NOTE — Progress Notes (Signed)
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  CT chest demonstrated large pericardial effusion and patient was transferred to [facility] for pericardiocentesis.  On 08/07/2020, patient underwent pericardiocentesis and during the procedure patient experienced vagal inhibition on advancement of the wire and drainage tube and went into PEA arrest requiring 3-4 minutes of CPR, epi x1 and atropine x1 with ROSC.  Patient was transferred to the CVICU and remained on ventilatory support.  Patient was extubated on 08/09/2020, and subsequently transferred to the hospitalist service on 08/11/2020.   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  History of lung cancer with recurrent malignant pleural effusion.  Chest x-ray with marked consolidation/opacification throughout the left hemithorax with minimal aeration near the lung apex on left.  Patient underwent left sided thoracentesis by PCCM with 50 cc of cloudy appearing fluid drained. --Albuterol MDI 2 puffs every 6 hours as needed wheezing/shortness of breath --Robitussin as needed --Continues home oxygen, maintain SPO2 greater than 92%; on 5 L nasal cannula  with SPO2 99%  Pericardial effusion PEA arrest Patient presenting to [facility] with shortness of breath and increased lower extreme edema.  CT imaging notable for large pericardial effusion.  Patient transferred to [facility] and underwent pericardiocentesis on 5/02, which was complicated by PEA arrest w/ ROSC achieved in 3-4 minutes.  Suspect malignant effusion from her underlying metastatic NSCLC.  Cytology from pericardial fluid with reactive mesothelial cells. --Cardiology following, Dr. [name]; appreciate assistance --Continues with pericardial drain in place,156mL out past 24h --Colchicine 0.6 mg p.o. daily --Pericardial fluid sent for molecular testing per oncology  Stage IV NSCLC Patient is followed by medical oncology outpatient, Dr. [name], last seen in ofice on 06/21/2020.  CT chest without contrast on admission with left breast heterogeneous density with skin thickening concerning for carcinoma, large pericardial effusion suspicious for malignant, left greater than right heterogeneous interstitial airspace opacity, axillary/mediastinal lymphadenopathy, sclerotic lesions left second, right third and left seventh ribs and 6 mm left adrenal mass all concerning for metastatic disease.  Underwent pericardiocentesis with grossly bloody fluid on 6/21 as above. --continue Tagrisso 80mg  PO daily --Palliative care following, appreciate assistance: Concern for progression of metastatic disease and overall very poor/grim prognosis.  Pending cytology from thoracentesis on 6/25, will likely need oncology to weigh in on prognosis and further recommendations this upcoming week.  Type 2 diabetes mellitus Hemoglobin A1c 6.3 on 08/06/2020, well controlled. --Carbohydrate/cardiac diet  Hypotension likely secondary to sedation Hx Essential hypertension Patient on amlodipine 5 mg p.o. daily, hydralazine 25 mg p.o. twice daily, metoprolol tartrate 25 mg p.o. twice daily, and furosemide 20mg  PO daily at baseline.   Following PEA arrest, patient required ventilatory and vasopressor support with phenylephrine, which has now been titrated off.  Suspect her hypotension was related to her  PEA arrest and sedation.  Now extubated. --BP 138/48 this morning. --Metoprolol tartrate 25 mg p.o. twice daily --Continue to hold other antihypertensives at this time --Monitor BP closely  Weakness, Debility, deconditioning: --PT/OT recommends SNF placement --TOC for eval and placement --continue therapy efforts while inpatient  DVT prophylaxis: SCDs Start: 08/05/20 2221   Code Status: Full Code Family Communication: Updated patient's niece, [name] via telephone this morning  Disposition Plan:  Level of care: Telemetry Cardiac Status is: Inpatient  Remains inpatient appropriate because:Ongoing diagnostic testing needed not appropriate for outpatient work up, Unsafe d/c plan, IV treatments appropriate due to intensity of illness or inability to take PO, and Inpatient level of care appropriate due to severity of illness  Dispo: The patient is from: Home              Anticipated d/c is to:  To be determined              Patient currently is not medically stable to d/c.   Difficult to place patient No  Consultants:  PCCM Cardiology, Dr. [name]  Procedures:  TTE 6/20, 6/22, 6/24 Pericardiocentesis 6/21 Thoracentesis 6/25  Antimicrobials:  Ceftriaxone 6/20 2-07/2022   Subjective: Patient seen and examined at bedside, resting comfortably.  Sitting in bedside chair.  Seen earlier by pulmonology with plan for thoracentesis later today.  Discussed with PCCM, concerned that malignancy has progressed given significant opacification left lung field noted on CT/chest x-ray.  Also seen by palliative care this morning, patient continues to request aggressive measures at this time.  Patient states feels weak and fatigued this morning.  No family present.  No other complaints or concerns at this time.  Remains on 5 L nasal  cannula.  Denies headache, no chest pain, no palpitations, no abdominal pain, no cough/congestion, no fever/chills/night sweats, no nausea/vomiting/diarrhea.  No acute concerns overnight per nurse staff.  Objective: Vitals:   08/11/20 0500 08/11/20 0700 08/11/20 0751 08/11/20 0800  BP: (!) 138/48 (!) 124/52  134/60  Pulse: 91 91  93  Resp: 17 17  18   Temp:   97.9 F (36.6 C)   TempSrc:   Oral   SpO2: 99% 100%  98%  Weight:      Height:        Intake/Output Summary (Last 24 hours) at 08/11/2020 0958 Last data filed at 08/11/2020 0800 Gross per 24 hour  Intake 320 ml  Output 255 ml  Net 65 ml   Filed Weights   08/07/20 0427 08/09/20 0335  Weight: 60.5 kg 62.8 kg    Examination:  General exam: Appears calm and comfortable, sitting in bedside chair, chronically ill in appearance Respiratory system: Breath sounds diminished left base, normal Respaire effort, on 5 L nasal cannula Cardiovascular system: S1 & S2 heard, RRR. No JVD, murmurs, rubs, gallops or clicks. No pedal edema.  Pericardial drain noted in place Gastrointestinal system: Abdomen is nondistended, soft and nontender. No organomegaly or masses felt. Normal bowel sounds heard. Central nervous system: Alert and oriented. No focal neurological deficits. Extremities: Symmetric 5 x 5 power. Skin: No rashes, lesions or ulcers Psychiatry: Judgement and insight appear normal. Mood & affect appropriate.     Data Reviewed: I have personally reviewed following labs and imaging studies  CBC: Recent Labs  Lab 08/05/20 2000 08/06/20 0330 08/07/20 0246 08/07/20 1639 08/07/20 1809 08/07/20 2009 08/08/20 0300 08/09/20 0647 08/10/20 0045  WBC 4.6 5.7 4.6  --   --   --  7.0 9.8 10.5  NEUTROABS 3.3  --   --   --   --   --   --  7.9*  --   HGB 13.3 11.6* 11.1*   < > 10.9* 11.2* 10.4* 12.0 11.6*  HCT 40.7 36.2 35.3*   < > 32.0* 33.0* 32.2* 36.9 36.0  MCV 91.1 92.1 91.5  --   --   --  90.7 90.0 89.8  PLT 231 245 216  --   --    --  211 201 190   < > = values in this interval not displayed.   Basic Metabolic Panel: Recent Labs  Lab 08/06/20 0330 08/07/20 0246 08/07/20 1639 08/07/20 1640 08/07/20 1809 08/07/20 2009 08/09/20 0647 08/10/20 0045 08/11/20 0232  NA 139 135   < > 141 140 140 139 135 131*  K 4.3 3.9   < > 4.3 4.3 4.4 3.9 4.0 4.0  CL 106 102  --  106  --   --  108 103 98  CO2 27 26  --   --   --   --  18* 22 24  GLUCOSE 123* 149*  --  170*  --   --  96 160* 145*  BUN 16 16  --  22  --   --  33* 47* 46*  CREATININE 0.82 0.85  --  1.00  --   --  1.84* 1.67* 1.10*  CALCIUM 8.9 9.0  --   --   --   --  8.7* 8.8* 8.7*  MG  --  2.0  --   --   --   --   --  1.9  --   PHOS  --  3.6  --   --   --   --   --  4.3  --    < > = values in this interval not displayed.   GFR: Estimated Creatinine Clearance: 37 mL/min (A) (by C-G formula based on SCr of 1.1 mg/dL (H)). Liver Function Tests: Recent Labs  Lab 08/06/20 0330 08/09/20 0647  AST 29 45*  ALT 19 31  ALKPHOS 85 76  BILITOT 0.7 2.1*  PROT 7.4 6.5  ALBUMIN 3.2* 2.4*   No results for input(s): LIPASE, AMYLASE in the last 168 hours. No results for input(s): AMMONIA in the last 168 hours. Coagulation Profile: No results for input(s): INR, PROTIME in the last 168 hours. Cardiac Enzymes: No results for input(s): CKTOTAL, CKMB, CKMBINDEX, TROPONINI in the last 168 hours. BNP (last 3 results) No results for input(s): PROBNP in the last 8760 hours. HbA1C: No results for input(s): HGBA1C in the last 72 hours. CBG: Recent Labs  Lab 08/10/20 0648 08/10/20 1138 08/10/20 1616 08/10/20 2119 08/11/20 0719  GLUCAP 104* 163* 171* 197* 114*   Lipid Profile: No results for input(s): CHOL, HDL, LDLCALC, TRIG, CHOLHDL, LDLDIRECT in the last 72 hours. Thyroid Function Tests: No results for input(s): TSH, T4TOTAL, FREET4, T3FREE, THYROIDAB in the last 72 hours. Anemia Panel: No results for input(s): VITAMINB12, FOLATE, FERRITIN, TIBC, IRON,  RETICCTPCT in the last 72 hours. Sepsis Labs: Recent Labs  Lab 08/05/20 2000  LATICACIDVEN 1.3    Recent Results (from the past 240 hour(s))  Resp Panel by RT-PCR (Flu A&B, Covid) Nasopharyngeal Swab     Status: None   Collection Time: 08/05/20  6:30 PM   Specimen: Nasopharyngeal Swab; Nasopharyngeal(NP) swabs in vial transport medium  Result Value Ref Range Status   SARS Coronavirus 2 by RT PCR NEGATIVE NEGATIVE Final  Comment: (NOTE) SARS-CoV-2 target nucleic acids are NOT DETECTED.  The SARS-CoV-2 RNA is generally detectable in upper respiratory specimens during the acute phase of infection. The lowest concentration of SARS-CoV-2 viral copies this assay can detect is 138 copies/mL. A negative result does not preclude SARS-Cov-2 infection and should not be used as the sole basis for treatment or other patient management decisions. A negative result may occur with  improper specimen collection/handling, submission of specimen other than nasopharyngeal swab, presence of viral mutation(s) within the areas targeted by this assay, and inadequate number of viral copies(<138 copies/mL). A negative result must be combined with clinical observations, patient history, and epidemiological information. The expected result is Negative.  Fact Sheet for Patients:  EntrepreneurPulse.com.au  Fact Sheet for Healthcare Providers:  IncredibleEmployment.be  This test is no t yet approved or cleared by the [place] FDA and  has been authorized for detection and/or diagnosis of SARS-CoV-2 by FDA under an Emergency Use Authorization (EUA). This EUA will remain  in effect (meaning this test can be used) for the duration of the COVID-19 declaration under Section 564(b)(1) of the Act, 21 U.S.C.section 360bbb-3(b)(1), unless the authorization is terminated  or revoked sooner.       Influenza A by PCR NEGATIVE NEGATIVE Final   Influenza B by PCR NEGATIVE  NEGATIVE Final    Comment: (NOTE) The Xpert Xpress SARS-CoV-2/FLU/RSV plus assay is intended as an aid in the diagnosis of influenza from Nasopharyngeal swab specimens and should not be used as a sole basis for treatment. Nasal washings and aspirates are unacceptable for Xpert Xpress SARS-CoV-2/FLU/RSV testing.  Fact Sheet for Patients: EntrepreneurPulse.com.au  Fact Sheet for Healthcare Providers: IncredibleEmployment.be  This test is not yet approved or cleared by the [place] FDA and has been authorized for detection and/or diagnosis of SARS-CoV-2 by FDA under an Emergency Use Authorization (EUA). This EUA will remain in effect (meaning this test can be used) for the duration of the COVID-19 declaration under Section 564(b)(1) of the Act, 21 U.S.C. section 360bbb-3(b)(1), unless the authorization is terminated or revoked.  Performed at [facility], [place] [address], [place], [place] [address]   Culture, blood (routine x 2)     Status: None   Collection Time: 08/05/20  8:18 PM   Specimen: BLOOD  Result Value Ref Range Status   Specimen Description   Final    BLOOD RIGHT ARM Performed at [place] [address] [place], [place] [address]    Special Requests   Final    BOTTLES DRAWN AEROBIC AND ANAEROBIC Blood Culture results may not be optimal due to an excessive volume of blood received in culture bottles Performed at [place] [address], [place], [place] [address]    Culture   Final    NO GROWTH 5 DAYS Performed at [facility], [place] [address], [place], [place] [address]    Report Status 08/11/2020 FINAL  Final  Culture, blood (routine x 2)     Status: None   Collection Time: 08/05/20  9:00 PM   Specimen: BLOOD  Result Value Ref Range Status   Specimen Description   Final    BLOOD BLOOD RIGHT HAND Performed at [place]  [address], [place],  [address]    Special Requests   Final    BOTTLES DRAWN AEROBIC AND ANAEROBIC Blood Culture adequate volume Performed at [place] [address] [place], [place]  [address]    Culture   Final    NO GROWTH 5 DAYS Performed at [facility], [place] [address], [place], [place] [address]    Report Status 08/11/2020 FINAL  Final  Culture, body fluid w Gram Stain-bottle     Status: None (Preliminary result)   Collection Time: 08/07/20  3:25 PM   Specimen: Fluid  Result Value Ref Range Status   Specimen Description FLUID PERICARDIAL  Final   Special Requests BOTTLES DRAWN AEROBIC AND ANAEROBIC  Final   Culture   Final    NO GROWTH 4 DAYS Performed at [facility], [place] [address] [place], [place] [address]    Report Status PENDING  Incomplete  Gram stain     Status: None   Collection Time: 08/07/20  3:25 PM   Specimen: Fluid  Result Value Ref Range Status   Specimen Description FLUID PERICARDIAL  Final   Special Requests NONE  Final   Gram Stain   Final    RARE WBC PRESENT,BOTH PMN AND MONONUCLEAR NO ORGANISMS SEEN Performed at [facility], [address], [place], [place] [address]    Report Status 08/07/2020 FINAL  Final  MRSA Next Gen by PCR, Nasal     Status: None   Collection Time: 08/07/20  5:36 PM   Specimen: Nasal Mucosa; Nasal Swab  Result Value Ref Range Status   MRSA by PCR Next Gen NOT DETECTED NOT DETECTED Final    Comment: (NOTE) The GeneXpert MRSA Assay (FDA approved for NASAL specimens only), is one component of a comprehensive MRSA colonization surveillance program. It is not intended to diagnose MRSA infection nor to guide or monitor treatment for MRSA infections. Test performance is not FDA approved in patients less than 10 years old. Performed at [facility], [place] [address] [place], [place] [address]   Culture, Respiratory w Gram Stain     Status: None (Preliminary result)    Collection Time: 08/09/20 10:35 AM   Specimen: Tracheal Aspirate; Respiratory  Result Value Ref Range Status   Specimen Description TRACHEAL ASPIRATE  Final   Special Requests NONE  Final   Gram Stain   Final    FEW WBC PRESENT,BOTH PMN AND MONONUCLEAR NO ORGANISMS SEEN    Culture   Final    NO GROWTH < 24 HOURS Performed at [facility], [place] [address], [place], [place] [address]    Report Status PENDING  Incomplete         Radiology Studies: DG Chest Port 1 View  Result Date: 08/10/2020 CLINICAL DATA:  Shortness of breath.  Extubation. EXAM: PORTABLE CHEST 1 VIEW COMPARISON:  08/09/2020 FINDINGS: Endotracheal tube and nasogastric tube have been removed. Again noted is marked consolidation and opacification throughout the left hemithorax with minimal aeration near the left lung apex. Left lung aeration has slightly decreased. Markedly decreased aeration in the right lung with increased patchy interstitial densities in the right lung. Cardiac silhouette is obscured by the lung densities. Negative for pneumothorax. Again noted is a small bore catheter overlying the left side of the heart. IMPRESSION: 1. Decreased aeration in both lungs following removal of the endotracheal tube. Difficult to exclude worsening edema or interstitial lung densities. 2. Stable position of the small bore tube overlying the left side of the chest. Electronically Signed   By: Markus Daft M.D.   On: 08/10/2020 09:02   ECHOCARDIOGRAM LIMITED  Result Date: 08/10/2020    ECHOCARDIOGRAM LIMITED REPORT  Patient Name:   JALEI SHIBLEY Date of Exam: 08/10/2020 Medical Rec #:  [id number]      Height:       64.0 in Accession #:    [id number]     Weight:       138.4 lb Date of Birth:  04-29-1945      BSA:          1.673 m Patient Age:    62 years       BP:           129/56 mmHg Patient Gender: F              HR:           89 bpm. Exam Location:  Inpatient Procedure: 2D Echo, Color Doppler and Cardiac Doppler Indications:      Pericardial effusion  History:         Patient has prior history of Echocardiogram examinations, most                  recent 08/08/2020. Signs/Symptoms:Shortness of Breath. Cancer.                  Pericarditis.  Sonographer:     Clayton Lefort RDCS (AE) Referring Phys:  [place] Diagnosing Phys: Dixie Dials MD IMPRESSIONS  1. Left ventricular ejection fraction, by estimation, is 60 to 65%. The left ventricle has normal function. The left ventricle has no regional wall motion abnormalities. There is moderate left ventricular hypertrophy. Left ventricular diastolic function  could not be evaluated.  2. Right ventricular systolic function is normal. The right ventricular size is normal.  3. Left atrial size was moderately dilated.  4. Right atrial size was moderately dilated.  5. A small pericardial effusion is present. The pericardial effusion is localized near the right ventricle, localized near the right atrium and surrounding the apex. There is no evidence of cardiac tamponade.  6. The mitral valve is degenerative. Mild mitral valve regurgitation.  7. The aortic valve is tricuspid. There is mild calcification of the aortic valve. There is mild thickening of the aortic valve. Mild to moderate aortic valve sclerosis/calcification is present, without any evidence of aortic stenosis.  8. There is mild (Grade II) atheroma plaque involving the aortic root.  9. The inferior vena cava is normal in size with <50% respiratory variability, suggesting right atrial pressure of 8 mmHg. FINDINGS  Left Ventricle: Left ventricular ejection fraction, by estimation, is 60 to 65%. The left ventricle has normal function. The left ventricle has no regional wall motion abnormalities. There is moderate left ventricular hypertrophy. Left ventricular diastolic function could not be evaluated. Right Ventricle: The right ventricular size is normal. No increase in right ventricular wall thickness. Right ventricular systolic function  is normal. Left Atrium: Left atrial size was moderately dilated. Right Atrium: Right atrial size was moderately dilated. Pericardium: A small pericardial effusion is present. The pericardial effusion is localized near the right ventricle, localized near the right atrium and surrounding the apex. There is no evidence of cardiac tamponade. Mitral Valve: The mitral valve is degenerative in appearance. There is mild thickening of the mitral valve leaflet(s). There is mild calcification of the mitral valve leaflet(s). Mild mitral annular calcification. Mild mitral valve regurgitation. Tricuspid Valve: The tricuspid valve is normal in structure. Tricuspid valve regurgitation is mild. Aortic Valve: The aortic valve is tricuspid. There is mild calcification of the aortic valve. There is mild thickening of the aortic valve. There is  mild aortic valve annular calcification. Mild to moderate aortic valve sclerosis/calcification is present, without any evidence of aortic stenosis. Pulmonic Valve: The pulmonic valve was normal in structure. Aorta: The aortic root is normal in size and structure. There is mild (Grade II) atheroma plaque involving the aortic root. Venous: The inferior vena cava is normal in size with less than 50% respiratory variability, suggesting right atrial pressure of 8 mmHg. IAS/Shunts: The interatrial septum was not assessed. Dixie Dials MD Electronically signed by Dixie Dials MD Signature Date/Time: 08/10/2020/1:26:34 PM    Final         Scheduled Meds:  Chlorhexidine Gluconate Cloth  6 each Topical Daily   colchicine  0.6 mg Oral Daily   metoprolol tartrate  25 mg Oral BID   osimertinib mesylate  80 mg Oral Daily   Continuous Infusions:  sodium chloride Stopped (08/08/20 1112)   sodium chloride Stopped (08/09/20 1528)     LOS: 6 days    Time spent: 46 minutes spent on chart review, discussion with nursing staff, consultants, updating family and interview/physical exam; more than 50%  of that time was spent in counseling and/or coordination of care.    Remon Quinto J [place] DO [facility] Available via Epic secure chat 7am-7pm After these hours, please refer to coverage provider listed on [email or web address] 08/11/2020, 9:58 AM

## 2020-08-11 NOTE — TOC Progression Note (Signed)
Transition of Care [facility]) - Progression Note    Patient Details  Name: Sara Ferguson MRN: [id number] Date of Birth: [birth year]  Transition of Care [facility]) CM/SW [place], [place] Phone Number: 08/11/2020, 11:40 AM  Clinical Narrative:    CSW waiting for Pt/OT evaluation.  TOC will continue to assist with disposition planning.        Expected Discharge Plan and Services                                                 Social Determinants of Health (SDOH) Interventions    Readmission Risk Interventions No flowsheet data found.

## 2020-08-11 NOTE — Consult Note (Signed)
Ref: [name], MD   Subjective:  Sitting up. VS stable. Creatinine improving.  Objective:  Vital Signs in the last 24 hours: Temp:  [97.6 F (36.4 C)-98.5 F (36.9 C)] 97.9 F (36.6 C) (06/25 0751) Pulse Rate:  [80-109] 89 (06/25 1000) Cardiac Rhythm: Normal sinus rhythm (06/25 0800) Resp:  [14-35] 20 (06/25 1000) BP: (109-138)/(43-60) 133/54 (06/25 1000) SpO2:  [85 %-100 %] 99 % (06/25 1000)  Physical Exam: BP Readings from Last 1 Encounters:  08/11/20 (!) 133/54     Wt Readings from Last 1 Encounters:  08/09/20 62.8 kg    Weight change:  Body mass index is 23.76 kg/m. HEENT: [place]/AT, Eyes-Brown, Conjunctiva-Pink, Sclera-Non-icteric Neck: No JVD, No bruit, Trachea midline. Lungs:  Clear, Bilateral. Cardiac:  Regular rhythm, normal S1 and S2, no S3. II/VI systolic murmur. Crunchy noise with leaning forward. Abdomen:  Soft, non-tender. BS present. Extremities:  No edema present. No cyanosis. No clubbing. CNS: AxOx3, Cranial nerves grossly intact, moves all 4 extremities.  Skin: Warm and dry.   Intake/Output from previous day: 06/24 0701 - 06/25 0700 In: 325 [P.O.:325] Out: 255 [Urine:150; Drains:105]    Lab Results: BMET    Component Value Date/Time   NA 131 (L) 08/11/2020 0232   NA 135 08/10/2020 0045   NA 139 08/09/2020 0647   K 4.0 08/11/2020 0232   K 4.0 08/10/2020 0045   K 3.9 08/09/2020 0647   CL 98 08/11/2020 0232   CL 103 08/10/2020 0045   CL 108 08/09/2020 0647   CO2 24 08/11/2020 0232   CO2 22 08/10/2020 0045   CO2 18 (L) 08/09/2020 0647   GLUCOSE 145 (H) 08/11/2020 0232   GLUCOSE 160 (H) 08/10/2020 0045   GLUCOSE 96 08/09/2020 0647   BUN 46 (H) 08/11/2020 0232   BUN 47 (H) 08/10/2020 0045   BUN 33 (H) 08/09/2020 0647   CREATININE 1.10 (H) 08/11/2020 0232   CREATININE 1.67 (H) 08/10/2020 0045   CREATININE 1.84 (H) 08/09/2020 0647   CREATININE 0.79 04/26/2020 1513   CREATININE 0.81 03/29/2020 1621   CREATININE 0.77 03/01/2020 1623    CALCIUM 8.7 (L) 08/11/2020 0232   CALCIUM 8.8 (L) 08/10/2020 0045   CALCIUM 8.7 (L) 08/09/2020 0647   GFRNONAA 52 (L) 08/11/2020 0232   GFRNONAA 31 (L) 08/10/2020 0045   GFRNONAA 28 (L) 08/09/2020 0647   GFRNONAA >60 04/26/2020 1513   GFRNONAA >60 03/29/2020 1621   GFRNONAA >60 03/01/2020 1623   CBC    Component Value Date/Time   WBC 10.5 08/10/2020 0045   RBC 4.01 08/10/2020 0045   HGB 11.6 (L) 08/10/2020 0045   HCT 36.0 08/10/2020 0045   PLT 190 08/10/2020 0045   MCV 89.8 08/10/2020 0045   MCH 28.9 08/10/2020 0045   MCHC 32.2 08/10/2020 0045   RDW 13.6 08/10/2020 0045   LYMPHSABS 0.9 08/09/2020 0647   MONOABS 1.0 08/09/2020 0647   EOSABS 0.0 08/09/2020 0647   BASOSABS 0.0 08/09/2020 0647   HEPATIC Function Panel Recent Labs    05/24/20 1517 08/06/20 0330 08/09/20 0647  PROT 8.7* 7.4 6.5   HEMOGLOBIN A1C No components found for: HGA1C,  MPG CARDIAC ENZYMES No results found for: CKTOTAL, CKMB, CKMBINDEX, TROPONINI BNP No results for input(s): PROBNP in the last 8760 hours. TSH No results for input(s): TSH in the last 8760 hours. CHOLESTEROL No results for input(s): CHOL in the last 8760 hours.  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   LOS: 6 days   Time spent including chart review, lab review, examination, discussion with patient/Nurse : 30 min   Dixie Dials  MD  08/11/2020, 10:08 AM

## 2020-08-11 NOTE — Plan of Care (Signed)

## 2020-08-11 NOTE — Procedures (Signed)
Thoracentesis  Procedure Note  Sara Ferguson  [id number]  02/07/1946  Date:08/11/20  Time:9:54 AM   Provider Performing:Aysia Lowder C Tamala Julian   Procedure: Thoracentesis with imaging guidance (53202)  Indication(s) Pleural Effusion  Consent Risks of the procedure as well as the alternatives and risks of each were explained to the patient and/or caregiver.  Consent for the procedure was obtained and is signed in the bedside chart  Anesthesia Topical only with 1% lidocaine    Time Out Verified patient identification, verified procedure, site/side was marked, verified correct patient position, special equipment/implants available, medications/allergies/relevant history reviewed, required imaging and test results available.   Sterile Technique Maximal sterile technique including full sterile barrier drape, hand hygiene, sterile gown, sterile gloves, mask, hair covering, sterile ultrasound probe cover (if used).  Procedure Description Ultrasound was used to identify appropriate pleural anatomy for placement and overlying skin marked.  Area of drainage cleaned and draped in sterile fashion. Lidocaine was used to anesthetize the skin and subcutaneous tissue.  50 cc's of cloudy appearing fluid was drained from the left pleural space. Catheter then removed and bandaid applied to site.   Complications/Tolerance None; patient tolerated the procedure well. Chest X-ray is ordered to confirm no post-procedural complication.   EBL Minimal   Specimen(s) Pleural fluid

## 2020-08-12 LAB — BASIC METABOLIC PANEL
Anion gap: 6 (ref 5–15)
BUN: 33 mg/dL — ABNORMAL HIGH (ref 8–23)
CO2: 25 mmol/L (ref 22–32)
Calcium: 8.6 mg/dL — ABNORMAL LOW (ref 8.9–10.3)
Chloride: 104 mmol/L (ref 98–111)
Creatinine, Ser: 0.86 mg/dL (ref 0.44–1.00)
GFR, Estimated: >60 mL/min (ref >60)
Glucose, Bld: 194 mg/dL — ABNORMAL HIGH (ref 70–99)
Potassium: 4 mmol/L (ref 3.5–5.1)
Sodium: 135 mmol/L (ref 135–145)

## 2020-08-12 LAB — CULTURE, BODY FLUID W GRAM STAIN -BOTTLE: Culture: NO GROWTH

## 2020-08-12 LAB — CBC
HCT: 34.8 % — ABNORMAL LOW (ref 36.0–46.0)
Hemoglobin: 11.4 g/dL — ABNORMAL LOW (ref 12.0–15.0)
MCH: 29.3 pg (ref 26.0–34.0)
MCHC: 32.8 g/dL (ref 30.0–36.0)
MCV: 89.5 fL (ref 80.0–100.0)
Platelets: 237 10*3/uL (ref 150–400)
RBC: 3.89 MIL/uL (ref 3.87–5.11)
RDW: 13.2 % (ref 11.5–15.5)
WBC: 5.6 10*3/uL (ref 4.0–10.5)
nRBC: 0 % (ref 0.0–0.2)

## 2020-08-12 LAB — GLUCOSE, CAPILLARY
Glucose-Capillary: 144 mg/dL — ABNORMAL HIGH (ref 70–99)
Glucose-Capillary: 216 mg/dL — ABNORMAL HIGH (ref 70–99)

## 2020-08-12 MED ORDER — INDOMETHACIN 25 MG PO CAPS
50.0000 mg | ORAL_CAPSULE | Freq: Two times a day (BID) | ORAL | Status: DC
  Administered 2020-08-12 – 2020-08-17 (×10): 50 mg via ORAL
  Filled 2020-08-12 (×11): qty 2

## 2020-08-12 MED ORDER — AMLODIPINE BESYLATE 5 MG PO TABS
5.0000 mg | ORAL_TABLET | Freq: Every day | ORAL | Status: DC
  Administered 2020-08-12 – 2020-08-17 (×6): 5 mg via ORAL
  Filled 2020-08-12 (×6): qty 1

## 2020-08-12 NOTE — Progress Notes (Signed)
[facility]  Resting comfortably today Thoracentesis yesterday, cytology pending I suspect that the left lower lobe consolidation seen on the CT of her chest is a mass Would recommend oncology consultation for the question: would a bronchoscopy/biopsy change medical management or should we consider hospice? PCCM favors hospice We will follow again after cytology returns, may need pleurex  Roselie Awkward, MD [place] [facility] Pager: [phone] Cell: [phone] If no response, please call [phone] until 7pm After 7:00 pm call Elink  [phone]

## 2020-08-12 NOTE — Progress Notes (Signed)
Palliative Medicine Inpatient Follow Up Note  Reason for consult:  "Stage IV lung cancer on treatment but frail"   HPI:  Per intake H&P --> 75 year old woman with PMHx significant for HTN, T2DM and stage IV NSCLC (c/b recurrent malignant pleural effusion, on oral chemotherapy) who presented to [facility] 6/19 for SOB, increased LE edema and chest discomfort. CT Chest completed demonstrating large pericardial effusion. Transferred to [facility] for pericardiocentesis.   Patient underwent pericardiocentesis 6/21; during the procedure, patient experienced vagal inhibition on advancement of the wire and drainage tube and went into PEA arrest requiring 3-4 minutes CPR, Epi x 1 and atropine x 1 with ROSC. She remained intubated post-procedure in the setting of arrest and was transferred to the CVICU.   Palliative care was consulted to start the conversation of goals of care for Sara Ferguson  Today's Discussion (08/12/2020):  *Please note that this is a verbal dictation therefore any spelling or grammatical errors are due to the "[employer]" system interpretation.  Chart reviewed.   I met with Sara Ferguson at bedside this afternoon. She was in poor spirits and reports a decrease in her appetite. I requested her RN, Sara Ferguson help reposition her. We were able to complete this tasks and set her up for lunch.  We reviewed that we are still awaiting cytology on her thoracentesis though hopefully this will be clarifying.  Requested consent to call patient's niece, Sara Ferguson to provide her an update as well.  I called Sara Ferguson this afternoon and I provided her with an update in regard to the concern for possible recurrence of lung cancer based upon a left lower lobe consolidation.  I shared that the plans from this point are to await cytology and hopefully have a meeting with the oncology team to provide a better insight in terms of options moving forward.  Sara Ferguson was very reasonably emotional over the phone as Sara Ferguson's sister  had died of breast cancer and her other sister had died just last weekend.  She shares that this is "a lot" for her to handle. Therapeutic silence provided for emotional support.  Questions and concerns addressed   Objective Assessment: Vital Signs Vitals:   08/12/20 1200 08/12/20 1300  BP: (!) 173/64 (!) 167/72  Pulse: 83 90  Resp: (!) 26 (!) 26  Temp:    SpO2: 93% 94%    Intake/Output Summary (Last 24 hours) at 08/12/2020 1325 Last data filed at 08/12/2020 1200 Gross per 24 hour  Intake 360 ml  Output 730 ml  Net -370 ml    Last Weight  Most recent update: 08/09/2020  3:38 AM    Weight  62.8 kg (138 lb 7.2 oz)            Gen:  Elderly AA F in NAD HEENT: moist mucous membranes CV: Regular rate and rhythm PULM: On 4LPM [place] ABD: soft/nontender EXT: No edema Neuro: Alert and oriented x3  SUMMARY OF RECOMMENDATIONS   Full code/Full scope of care   Will need to see what additional wu / studies reveal   If needed we are happy to coordinate a family meeting pending additional information as above   Spiritual support further help with advanced directives   Ongoing incremental palliative care support  Time Spent: 25 Greater than 50% of the time was spent in counseling and coordination of care ______________________________________________________________________________________ [place] Team Team Cell Phone: [phone] Please utilize secure chat with additional questions, if there is no response within 30  minutes please call the above phone number  Palliative Medicine Team providers are available by phone from 7am to 7pm daily and can be reached through the team cell phone.  Should this patient require assistance outside of these hours, please call the patient's attending physician.

## 2020-08-12 NOTE — Progress Notes (Signed)
Inpatient Rehab Admissions Coordinator Note:   Pt. was screened for CIR candidacy by Ricci Dirocco, MS CCC-SLP. At this time, Pt. Appears to have functional decline and is a potential candidate for CIR. Will place order for rehab consult per protocol.  Please contact me with questions.   Wilho Sharpley, MS, CCC-SLP Rehab Admissions Coordinator  [phone] (celll) [phone] (office)  

## 2020-08-12 NOTE — Progress Notes (Signed)
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  CT chest demonstrated large pericardial effusion and patient was transferred to [facility] for pericardiocentesis.  On 08/07/2020, patient underwent pericardiocentesis and during the procedure patient experienced vagal inhibition on advancement of the wire and drainage tube and went into PEA arrest requiring 3-4 minutes of CPR, epi x1 and atropine x1 with ROSC.  Patient was transferred to the CVICU and remained on ventilatory support.  Patient was extubated on 08/09/2020, and subsequently transferred to the hospitalist service on 08/11/2020.   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  History of lung cancer with recurrent malignant pleural effusion.  Chest x-ray with marked consolidation/opacification throughout the left hemithorax with minimal aeration near the lung apex on left.  Patient underwent left sided thoracentesis by PCCM with 50 cc of cloudy appearing fluid drained. --Albuterol MDI 2 puffs every 6 hours as needed wheezing/shortness of breath --Robitussin as needed --Continues home oxygen, maintain SPO2 greater than 92%; on 1L nasal cannula with  SPO2 93%  Pericardial effusion PEA arrest Patient presenting to [facility] with shortness of breath and increased lower extreme edema.  CT imaging notable for large pericardial effusion.  Patient transferred to [facility] and underwent pericardiocentesis on 4/80, which was complicated by PEA arrest w/ ROSC achieved in 3-4 minutes.  Suspect malignant effusion from her underlying metastatic NSCLC.  Cytology from pericardial fluid with reactive mesothelial cells. --Cardiology following, Dr. [name]; appreciate assistance --Continues with pericardial drain in place,45mL out past 24h --Colchicine 0.6 mg p.o. daily --Pericardial fluid sent for molecular testing per oncology  Stage IV NSCLC Patient is followed by medical oncology outpatient, Dr. [name], last seen in ofice on 06/21/2020.  CT chest without contrast on admission with left breast heterogeneous density with skin thickening concerning for carcinoma, large pericardial effusion suspicious for malignant, left greater than right heterogeneous interstitial airspace opacity, axillary/mediastinal lymphadenopathy, sclerotic lesions left second, right third and left seventh ribs and 6 mm left adrenal mass all concerning for metastatic disease.  Underwent pericardiocentesis with grossly bloody fluid on 6/21 as above.  Pulmonology believes her left lower lobe consolidation on CT of her chest is a mass. --continue Tagrisso 80mg  PO daily --Cytology from thoracentesis: Pending --Palliative care following, appreciate assistance: Concern for progression of metastatic disease and overall very poor/grim prognosis.  Pending cytology from thoracentesis on 6/25, will likely need oncology to weigh in on prognosis and further recommendations this upcoming week; whether a bronchoscopy/biopsy would change medical management or should hospice be a consideration.  Type 2 diabetes mellitus Hemoglobin A1c 6.3 on 08/06/2020, well controlled. --Carbohydrate/cardiac diet  Hypotension likely  secondary to sedation Hx Essential hypertension Patient on amlodipine 5 mg p.o. daily, hydralazine 25 mg p.o. twice daily, metoprolol tartrate 25 mg p.o. twice daily, and  furosemide 20mg  PO daily at baseline.  Following PEA arrest, patient required ventilatory and vasopressor support with phenylephrine, which has now been titrated off.  Suspect her hypotension was related to her PEA arrest and sedation.  Now extubated. --BP 138/48 this morning. --Metoprolol tartrate 25 mg p.o. twice daily --Continue to hold other antihypertensives at this time --Monitor BP closely  Weakness, Debility, deconditioning: --PT/OT recommends SNF placement --TOC for eval and placement --continue therapy efforts while inpatient  DVT prophylaxis: SCDs Start: 08/05/20 2221   Code Status: Full Code Family Communication: Updated patient's niece, [name] via telephone this morning  Disposition Plan:  Level of care: Telemetry Cardiac Status is: Inpatient  Remains inpatient appropriate because:Ongoing diagnostic testing needed not appropriate for outpatient work up, Unsafe d/c plan, IV treatments appropriate due to intensity of illness or inability to take PO, and Inpatient level of care appropriate due to severity of illness  Dispo: The patient is from: Home              Anticipated d/c is to:  To be determined              Patient currently is not medically stable to d/c.   Difficult to place patient No  Consultants:  PCCM Cardiology, Dr. [name]  Procedures:  TTE 6/20, 6/22, 6/24 Pericardiocentesis 6/21 Thoracentesis 6/25  Antimicrobials:  Ceftriaxone 6/20 2-07/2022   Subjective: Patient seen and examined at bedside, resting comfortably.  Lying in bed.  No specific complaints this morning.  Continues with mild shortness of breath, improved.  Feels weak and fatigued.  No other questions or concerns at this time.  No family present this morning.  Oxygen now titrated down to 1 L per nasal cannula.   Discussed with PCCM, Dr. Lake Bells once again this morning.  Denies headache, no chest pain, no palpitations, no abdominal pain, no cough/congestion, no fever/chills/night sweats, no nausea/vomiting/diarrhea.  No acute concerns overnight per nurse staff.  Objective: Vitals:   08/12/20 1115 08/12/20 1130 08/12/20 1152 08/12/20 1200  BP:    (!) 173/64  Pulse: 83  87 83  Resp: (!) 26  (!) 26 (!) 26  Temp:  98 F (36.7 C)    TempSrc:  Oral    SpO2: 95%  (!) 86% 93%  Weight:      Height:        Intake/Output Summary (Last 24 hours) at 08/12/2020 1258 Last data filed at 08/12/2020 1200 Gross per 24 hour  Intake 480 ml  Output 730 ml  Net -250 ml   Filed Weights   08/07/20 0427 08/09/20 0335  Weight: 60.5 kg 62.8 kg    Examination:  General exam: Appears calm and comfortable, sitting in bedside chair, chronically ill in appearance Respiratory system: Breath sounds diminished left base, normal respiratory effort, on 1 L nasal cannula Cardiovascular system: S1 & S2 heard, RRR. No JVD, murmurs, rubs, gallops or clicks. No pedal edema.  Pericardial drain noted in place Gastrointestinal system: Abdomen is nondistended, soft and nontender. No organomegaly or masses felt. Normal bowel sounds heard. Central nervous system: Alert and oriented. No focal neurological deficits. Extremities: Symmetric 5 x 5 power. Skin: No rashes, lesions or ulcers Psychiatry: Judgement and insight appear normal. Mood & affect appropriate.     Data Reviewed: I have personally reviewed following labs and imaging studies  CBC: Recent Labs  Lab 08/05/20 2000 08/06/20 0330 08/07/20 0246 08/07/20 1639 08/07/20 2009 08/08/20 0300 08/09/20 0647 08/10/20 0045 08/12/20 0030  WBC 4.6   < >  4.6  --   --  7.0 9.8 10.5 5.6  NEUTROABS 3.3  --   --   --   --   --  7.9*  --   --   HGB 13.3   < > 11.1*   < > 11.2* 10.4* 12.0 11.6* 11.4*  HCT 40.7   < > 35.3*   < > 33.0* 32.2* 36.9 36.0 34.8*  MCV 91.1   < > 91.5   --   --  90.7 90.0 89.8 89.5  PLT 231   < > 216  --   --  211 201 190 237   < > = values in this interval not displayed.   Basic Metabolic Panel: Recent Labs  Lab 08/07/20 0246 08/07/20 1639 08/07/20 1640 08/07/20 1809 08/07/20 2009 08/09/20 0647 08/10/20 0045 08/11/20 0232 08/12/20 0030  NA 135   < > 141   < > 140 139 135 131* 135  K 3.9   < > 4.3   < > 4.4 3.9 4.0 4.0 4.0  CL 102  --  106  --   --  108 103 98 104  CO2 26  --   --   --   --  18* 22 24 25   GLUCOSE 149*  --  170*  --   --  96 160* 145* 194*  BUN 16  --  22  --   --  33* 47* 46* 33*  CREATININE 0.85  --  1.00  --   --  1.84* 1.67* 1.10* 0.86  CALCIUM 9.0  --   --   --   --  8.7* 8.8* 8.7* 8.6*  MG 2.0  --   --   --   --   --  1.9  --   --   PHOS 3.6  --   --   --   --   --  4.3  --   --    < > = values in this interval not displayed.   GFR: Estimated Creatinine Clearance: 47.3 mL/min (by C-G formula based on SCr of 0.86 mg/dL). Liver Function Tests: Recent Labs  Lab 08/06/20 0330 08/09/20 0647  AST 29 45*  ALT 19 31  ALKPHOS 85 76  BILITOT 0.7 2.1*  PROT 7.4 6.5  ALBUMIN 3.2* 2.4*   No results for input(s): LIPASE, AMYLASE in the last 168 hours. No results for input(s): AMMONIA in the last 168 hours. Coagulation Profile: No results for input(s): INR, PROTIME in the last 168 hours. Cardiac Enzymes: No results for input(s): CKTOTAL, CKMB, CKMBINDEX, TROPONINI in the last 168 hours. BNP (last 3 results) No results for input(s): PROBNP in the last 8760 hours. HbA1C: No results for input(s): HGBA1C in the last 72 hours. CBG: Recent Labs  Lab 08/11/20 0719 08/11/20 1132 08/11/20 1532 08/11/20 2157 08/12/20 0641  GLUCAP 114* 125* 198* 175* 144*   Lipid Profile: No results for input(s): CHOL, HDL, LDLCALC, TRIG, CHOLHDL, LDLDIRECT in the last 72 hours. Thyroid Function Tests: No results for input(s): TSH, T4TOTAL, FREET4, T3FREE, THYROIDAB in the last 72 hours. Anemia Panel: No results for  input(s): VITAMINB12, FOLATE, FERRITIN, TIBC, IRON, RETICCTPCT in the last 72 hours. Sepsis Labs: Recent Labs  Lab 08/05/20 2000  LATICACIDVEN 1.3    Recent Results (from the past 240 hour(s))  Resp Panel by RT-PCR (Flu A&B, Covid) Nasopharyngeal Swab     Status: None   Collection Time: 08/05/20  6:30 PM   Specimen: Nasopharyngeal Swab;  Nasopharyngeal(NP) swabs in vial transport medium  Result Value Ref Range Status   SARS Coronavirus 2 by RT PCR NEGATIVE NEGATIVE Final    Comment: (NOTE) SARS-CoV-2 target nucleic acids are NOT DETECTED.  The SARS-CoV-2 RNA is generally detectable in upper respiratory specimens during the acute phase of infection. The lowest concentration of SARS-CoV-2 viral copies this assay can detect is 138 copies/mL. A negative result does not preclude SARS-Cov-2 infection and should not be used as the sole basis for treatment or other patient management decisions. A negative result may occur with  improper specimen collection/handling, submission of specimen other than nasopharyngeal swab, presence of viral mutation(s) within the areas targeted by this assay, and inadequate number of viral copies(<138 copies/mL). A negative result must be combined with clinical observations, patient history, and epidemiological information. The expected result is Negative.  Fact Sheet for Patients:  EntrepreneurPulse.com.au  Fact Sheet for Healthcare Providers:  IncredibleEmployment.be  This test is no t yet approved or cleared by the [place] FDA and  has been authorized for detection and/or diagnosis of SARS-CoV-2 by FDA under an Emergency Use Authorization (EUA). This EUA will remain  in effect (meaning this test can be used) for the duration of the COVID-19 declaration under Section 564(b)(1) of the Act, 21 U.S.C.section 360bbb-3(b)(1), unless the authorization is terminated  or revoked sooner.       Influenza A by PCR  NEGATIVE NEGATIVE Final   Influenza B by PCR NEGATIVE NEGATIVE Final    Comment: (NOTE) The Xpert Xpress SARS-CoV-2/FLU/RSV plus assay is intended as an aid in the diagnosis of influenza from Nasopharyngeal swab specimens and should not be used as a sole basis for treatment. Nasal washings and aspirates are unacceptable for Xpert Xpress SARS-CoV-2/FLU/RSV testing.  Fact Sheet for Patients: EntrepreneurPulse.com.au  Fact Sheet for Healthcare Providers: IncredibleEmployment.be  This test is not yet approved or cleared by the [place] FDA and has been authorized for detection and/or diagnosis of SARS-CoV-2 by FDA under an Emergency Use Authorization (EUA). This EUA will remain in effect (meaning this test can be used) for the duration of the COVID-19 declaration under Section 564(b)(1) of the Act, 21 U.S.C. section 360bbb-3(b)(1), unless the authorization is terminated or revoked.  Performed at [facility], [place] [address] [place], [place] [address]   Culture, blood (routine x 2)     Status: None   Collection Time: 08/05/20  8:18 PM   Specimen: BLOOD  Result Value Ref Range Status   Specimen Description   Final    BLOOD RIGHT ARM Performed at [place] [address], [place], [place] [address]    Special Requests   Final    BOTTLES DRAWN AEROBIC AND ANAEROBIC Blood Culture results may not be optimal due to an excessive volume of blood received in culture bottles Performed at [facility] [address], [place], [place] [address]    Culture   Final    NO GROWTH 5 DAYS Performed at [facility], [place] [address], [place], [place] [address]    Report Status 08/11/2020 FINAL  Final  Culture, blood (routine x 2)     Status: None   Collection Time: 08/05/20  9:00 PM   Specimen: BLOOD  Result Value Ref Range Status   Specimen Description   Final    BLOOD BLOOD RIGHT  HAND Performed at [facility] [address], [place], [place] [address]    Special Requests  Final    BOTTLES DRAWN AEROBIC AND ANAEROBIC Blood Culture adequate volume Performed at [facility] [address], [place], [place] [address]    Culture   Final    NO GROWTH 5 DAYS Performed at [facility], [place] [address], [place], [place] [address]    Report Status 08/11/2020 FINAL  Final  Culture, body fluid w Gram Stain-bottle     Status: None   Collection Time: 08/07/20  3:25 PM   Specimen: Fluid  Result Value Ref Range Status   Specimen Description FLUID PERICARDIAL  Final   Special Requests BOTTLES DRAWN AEROBIC AND ANAEROBIC  Final   Culture   Final    NO GROWTH 5 DAYS Performed at [facility], [place] [address], [place], [place] [address]    Report Status 08/12/2020 FINAL  Final  Gram stain     Status: None   Collection Time: 08/07/20  3:25 PM   Specimen: Fluid  Result Value Ref Range Status   Specimen Description FLUID PERICARDIAL  Final   Special Requests NONE  Final   Gram Stain   Final    RARE WBC PRESENT,BOTH PMN AND MONONUCLEAR NO ORGANISMS SEEN Performed at [facility], [place] [address], [place], [place] [address]    Report Status 08/07/2020 FINAL  Final  MRSA Next Gen by PCR, Nasal     Status: None   Collection Time: 08/07/20  5:36 PM   Specimen: Nasal Mucosa; Nasal Swab  Result Value Ref Range Status   MRSA by PCR Next Gen NOT DETECTED NOT DETECTED Final    Comment: (NOTE) The GeneXpert MRSA Assay (FDA approved for NASAL specimens only), is one component of a comprehensive MRSA colonization surveillance program. It is not intended to diagnose MRSA infection nor to guide or monitor treatment for MRSA infections. Test performance is not FDA approved in patients less than 56 years old. Performed at [facility], [place] [address], [place], [place] [address]   Culture, Respiratory w Gram Stain      Status: None   Collection Time: 08/09/20 10:35 AM   Specimen: Tracheal Aspirate; Respiratory  Result Value Ref Range Status   Specimen Description TRACHEAL ASPIRATE  Final   Special Requests NONE  Final   Gram Stain   Final    FEW WBC PRESENT,BOTH PMN AND MONONUCLEAR NO ORGANISMS SEEN    Culture   Final    RARE Normal respiratory flora-no Staph aureus or Pseudomonas seen Performed at [facility], [address], [place], [place] [address]    Report Status 08/11/2020 FINAL  Final         Radiology Studies: DG Chest 1 View  Result Date: 08/11/2020 CLINICAL DATA:  Shortness of breath.  Thoracentesis EXAM: CHEST  1 VIEW COMPARISON:  Yesterday FINDINGS: Negative for pneumothorax. Continued extensive opacification of the left chest from pulmonary opacity and small loculated pleural effusion by CT. Cardiopericardial enlargement with large pericardial effusion by CT. Improved aeration on the right, although persistent perihilar infiltrate. IMPRESSION: 1. No acute finding related to thoracentesis. 2. No detected change from yesterday other than improved lung volumes. Electronically Signed   By: Monte Fantasia M.D.   On: 08/11/2020 10:29        Scheduled Meds:  Chlorhexidine Gluconate Cloth  6 each Topical Daily   colchicine  0.6 mg Oral Daily   indomethacin  50 mg Oral BID WC   metoprolol tartrate  25 mg Oral  BID   osimertinib mesylate  80 mg Oral Daily   Continuous Infusions:  sodium chloride Stopped (08/09/20 1528)     LOS: 7 days    Time spent: 38 minutes spent on chart review, discussion with nursing staff, consultants, updating family and interview/physical exam; more than 50% of that time was spent in counseling and/or coordination of care.    Adeana Grilliot J [place] DO [facility] Available via Epic secure chat 7am-7pm After these hours, please refer to coverage provider listed on [email or web address] 08/12/2020, 12:58 PM

## 2020-08-12 NOTE — Progress Notes (Signed)
[place] attempted visit per [facility] consult for assistance w/AD and assessment of needs but pt. appeared to be sleeping.  [place] will follow up this week.  Lindaann Pascal, Chaplain Pager: [phone]

## 2020-08-13 LAB — GLUCOSE, CAPILLARY
Glucose-Capillary: 173 mg/dL — ABNORMAL HIGH (ref 70–99)
Glucose-Capillary: 135 mg/dL — ABNORMAL HIGH (ref 70–99)
Glucose-Capillary: 147 mg/dL — ABNORMAL HIGH (ref 70–99)
Glucose-Capillary: 211 mg/dL — ABNORMAL HIGH (ref 70–99)
Glucose-Capillary: 221 mg/dL — ABNORMAL HIGH (ref 70–99)
Glucose-Capillary: 181 mg/dL — ABNORMAL HIGH (ref 70–99)

## 2020-08-13 LAB — CYTOLOGY - NON PAP

## 2020-08-13 NOTE — Progress Notes (Addendum)
This chaplain is present with the Pt., notary, and two witnesses for the notarizing of the Pt. HCPOA.  The Pt. named Diona Fanti as her healthcare agent.  Cephus Shelling is the second person if if the healthcare agent is unwilling or unable to serve.  The chaplain returned the original HCPOA and two copies to the Pt.  A copy of the Pt. HCPOA was scanned into the Pt. EMR.  This chaplain is available for F/U spiritual care as needed, [phone].  **1400 This chaplain returned to the Pt. bedside and listened reflectively to the Pt. 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 Now the nieces are caring for her. The chaplain understands the Pt. is grateful as she elaborates, relationships are based on "how one person chooses to treat the other person" and often will make the difference in a person's life.

## 2020-08-13 NOTE — Consult Note (Signed)
Late entry Ref: [name], MD   Subjective:  Awake. Refuses colchicine as it interferes with her biological chemotherapy.  Objective:  Vital Signs in the last 24 hours: Temp:  [97.9 F (36.6 C)-98.4 F (36.9 C)] 97.9 F (36.6 C) (06/27 1032) Pulse Rate:  [75-99] 94 (06/27 1032) Cardiac Rhythm: Normal sinus rhythm (06/27 0800) Resp:  [14-28] 18 (06/27 1032) BP: (130-186)/(47-148) 186/81 (06/27 1032) SpO2:  [90 %-100 %] 100 % (06/27 1032) Weight:  [60.8 kg] 60.8 kg (06/27 1032)  Physical Exam: BP Readings from Last 1 Encounters:  08/13/20 (!) 186/81     Wt Readings from Last 1 Encounters:  08/13/20 60.8 kg    Weight change:  Body mass index is 23.01 kg/m. HEENT: [place]/AT, Eyes-Brown, PERL, EOMI, Conjunctiva-Pink, Sclera-Non-icteric Neck: No JVD, No bruit, Trachea midline. Lungs:  Clear, Bilateral. Cardiac:  Regular rhythm, normal S1 and S2, no S3. II/VI systolic murmur. Abdomen:  Soft, non-tender. BS present. Extremities:  No edema present. No cyanosis. No clubbing. CNS: AxOx3, Cranial nerves grossly intact, moves all 4 extremities.  Skin: Warm and dry.   Intake/Output from previous day: 06/26 0701 - 06/27 0700 In: 360 [P.O.:360] Out: 590 [Urine:550; Drains:40]    Lab Results: BMET    Component Value Date/Time   NA 135 08/12/2020 0030   NA 131 (L) 08/11/2020 0232   NA 135 08/10/2020 0045   K 4.0 08/12/2020 0030   K 4.0 08/11/2020 0232   K 4.0 08/10/2020 0045   CL 104 08/12/2020 0030   CL 98 08/11/2020 0232   CL 103 08/10/2020 0045   CO2 25 08/12/2020 0030   CO2 24 08/11/2020 0232   CO2 22 08/10/2020 0045   GLUCOSE 194 (H) 08/12/2020 0030   GLUCOSE 145 (H) 08/11/2020 0232   GLUCOSE 160 (H) 08/10/2020 0045   BUN 33 (H) 08/12/2020 0030   BUN 46 (H) 08/11/2020 0232   BUN 47 (H) 08/10/2020 0045   CREATININE 0.86 08/12/2020 0030   CREATININE 1.10 (H) 08/11/2020 0232   CREATININE 1.67 (H) 08/10/2020 0045   CREATININE 0.79 04/26/2020 1513   CREATININE  0.81 03/29/2020 1621   CREATININE 0.77 03/01/2020 1623   CALCIUM 8.6 (L) 08/12/2020 0030   CALCIUM 8.7 (L) 08/11/2020 0232   CALCIUM 8.8 (L) 08/10/2020 0045   GFRNONAA >60 08/12/2020 0030   GFRNONAA 52 (L) 08/11/2020 0232   GFRNONAA 31 (L) 08/10/2020 0045   GFRNONAA >60 04/26/2020 1513   GFRNONAA >60 03/29/2020 1621   GFRNONAA >60 03/01/2020 1623   CBC    Component Value Date/Time   WBC 5.6 08/12/2020 0030   RBC 3.89 08/12/2020 0030   HGB 11.4 (L) 08/12/2020 0030   HCT 34.8 (L) 08/12/2020 0030   PLT 237 08/12/2020 0030   MCV 89.5 08/12/2020 0030   MCH 29.3 08/12/2020 0030   MCHC 32.8 08/12/2020 0030   RDW 13.2 08/12/2020 0030   LYMPHSABS 0.9 08/09/2020 0647   MONOABS 1.0 08/09/2020 0647   EOSABS 0.0 08/09/2020 0647   BASOSABS 0.0 08/09/2020 0647   HEPATIC Function Panel Recent Labs    05/24/20 1517 08/06/20 0330 08/09/20 0647  PROT 8.7* 7.4 6.5   HEMOGLOBIN A1C No components found for: HGA1C,  MPG CARDIAC ENZYMES No results found for: CKTOTAL, CKMB, CKMBINDEX, TROPONINI BNP No results for input(s): PROBNP in the last 8760 hours. TSH No results for input(s): TSH in the last 8760 hours. CHOLESTEROL No results for input(s): CHOL in the last 8760 hours.  Scheduled Meds:  amLODipine  5 mg Oral Daily   Chlorhexidine Gluconate Cloth  6 each Topical Daily   colchicine  0.6 mg Oral Daily   indomethacin  50 mg Oral BID WC   metoprolol tartrate  25 mg Oral BID   osimertinib mesylate  80 mg Oral Daily   Continuous Infusions:  sodium chloride Stopped (08/09/20 1528)   PRN Meds:.acetaminophen **OR** acetaminophen, albuterol, guaiFENesin, hydrALAZINE, hydrocortisone cream, ondansetron **OR** ondansetron (ZOFRAN) IV  Assessment/Plan:  Acute on chronic respiratory failure with hypoxia, improving Acute renal failure, improving Chronic pericardial effusion Adenocarcinoma of left lung with mets S/P Pericardiocentesis Type 2 DM HTN Mild protein calorie  malnutrition Mild diastolic dysfunction  Pericardial drain removed. Patient remained stable. Add indomethacin 50 mg. Bid. If okay with oncology   LOS: 8 days   Time spent including chart review, lab review, examination, discussion with patient :  min   Dixie Dials  MD  08/13/2020, 1:33 PM

## 2020-08-13 NOTE — Progress Notes (Addendum)
This chaplain responded PMT consult for notarizing the Pt. HCPOA.  The chaplain was updated by the Pt. RN Marolyn Hammock the Pt. may be moving to a new room. The Pt. is sleeping in the bedside recliner and takes a few calls of her name to wake up.  The chaplain understands from the Pt. she wants to pursue notarizing the HCPOA and understands the role of the healthcare agent.  The Pt. document is in the Pt. room.  The chaplain has requested a notary visit between 1-3:30pm today. The chaplain will update the RN when a time is determined.  This chaplain is available for F/U spiritual care as needed, [phone].  **[id number] Notary for HCPOA confirmed at 1pm today.

## 2020-08-13 NOTE — Progress Notes (Signed)
Inpatient Rehabilitation Admissions Coordinator   Inpatient rehab consult received. Noted medical workup ongoing. I await further medical workup before pursuing rehab venue options. Noted lives with niece who works from home. I will follow up tomorrow.  Danne Baxter, RN, MSN Rehab Admissions Coordinator [phone] 08/13/2020 11:27 AM

## 2020-08-13 NOTE — Progress Notes (Signed)
Pleural fluid c/w malignancy; can bronch if oncology desires.  PCCM available PRN.  Erskine Emery MD PCCM

## 2020-08-13 NOTE — Consult Note (Signed)
Ref: Sara Pike, MD   Subjective:  Awake. No chest pain. VS stable. Monitor: NSR.  Objective:  Vital Signs in the last 24 hours: Temp:  [97.9 F (36.6 C)-98.4 F (36.9 C)] 97.9 F (36.6 C) (06/27 1032) Pulse Rate:  [75-99] 94 (06/27 1032) Cardiac Rhythm: Normal sinus rhythm (06/27 0800) Resp:  [14-28] 18 (06/27 1032) BP: (130-186)/(47-148) 186/81 (06/27 1032) SpO2:  [90 %-100 %] 100 % (06/27 1032) Weight:  [60.8 kg] 60.8 kg (06/27 1032)  Physical Exam: BP Readings from Last 1 Encounters:  08/13/20 (!) 186/81     Wt Readings from Last 1 Encounters:  08/13/20 60.8 kg    Weight change:  Body mass index is 23.01 kg/m. HEENT: [place]/AT, Eyes-Brown, Conjunctiva-Pink, Sclera-Non-icteric Neck: No JVD, No bruit, Trachea midline. Lungs:  Clear, Bilateral. Cardiac:  Regular rhythm, normal S1 and S2, no S3. II/VI systolic murmur. Abdomen:  Soft, non-tender. BS present. Extremities:  No edema present. No cyanosis. No clubbing. CNS: AxOx3, Cranial nerves grossly intact, moves all 4 extremities.  Skin: Warm and dry.   Intake/Output from previous day: 06/26 0701 - 06/27 0700 In: 360 [P.O.:360] Out: 590 [Urine:550; Drains:40]    Lab Results: BMET    Component Value Date/Time   NA 135 08/12/2020 0030   NA 131 (L) 08/11/2020 0232   NA 135 08/10/2020 0045   K 4.0 08/12/2020 0030   K 4.0 08/11/2020 0232   K 4.0 08/10/2020 0045   CL 104 08/12/2020 0030   CL 98 08/11/2020 0232   CL 103 08/10/2020 0045   CO2 25 08/12/2020 0030   CO2 24 08/11/2020 0232   CO2 22 08/10/2020 0045   GLUCOSE 194 (H) 08/12/2020 0030   GLUCOSE 145 (H) 08/11/2020 0232   GLUCOSE 160 (H) 08/10/2020 0045   BUN 33 (H) 08/12/2020 0030   BUN 46 (H) 08/11/2020 0232   BUN 47 (H) 08/10/2020 0045   CREATININE 0.86 08/12/2020 0030   CREATININE 1.10 (H) 08/11/2020 0232   CREATININE 1.67 (H) 08/10/2020 0045   CREATININE 0.79 04/26/2020 1513   CREATININE 0.81 03/29/2020 1621   CREATININE 0.77 03/01/2020  1623   CALCIUM 8.6 (L) 08/12/2020 0030   CALCIUM 8.7 (L) 08/11/2020 0232   CALCIUM 8.8 (L) 08/10/2020 0045   GFRNONAA >60 08/12/2020 0030   GFRNONAA 52 (L) 08/11/2020 0232   GFRNONAA 31 (L) 08/10/2020 0045   GFRNONAA >60 04/26/2020 1513   GFRNONAA >60 03/29/2020 1621   GFRNONAA >60 03/01/2020 1623   CBC    Component Value Date/Time   WBC 5.6 08/12/2020 0030   RBC 3.89 08/12/2020 0030   HGB 11.4 (L) 08/12/2020 0030   HCT 34.8 (L) 08/12/2020 0030   PLT 237 08/12/2020 0030   MCV 89.5 08/12/2020 0030   MCH 29.3 08/12/2020 0030   MCHC 32.8 08/12/2020 0030   RDW 13.2 08/12/2020 0030   LYMPHSABS 0.9 08/09/2020 0647   MONOABS 1.0 08/09/2020 0647   EOSABS 0.0 08/09/2020 0647   BASOSABS 0.0 08/09/2020 0647   HEPATIC Function Panel Recent Labs    05/24/20 1517 08/06/20 0330 08/09/20 0647  PROT 8.7* 7.4 6.5   HEMOGLOBIN A1C No components found for: HGA1C,  MPG CARDIAC ENZYMES No results found for: CKTOTAL, CKMB, CKMBINDEX, TROPONINI BNP No results for input(s): PROBNP in the last 8760 hours. TSH No results for input(s): TSH in the last 8760 hours. CHOLESTEROL No results for input(s): CHOL in the last 8760 hours.  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   LOS: 8 days   Time spent including chart review, lab review, examination, discussion with patient/Nurse : 30 min   Dixie Dials  MD  08/13/2020, 1:16 PM

## 2020-08-13 NOTE — Progress Notes (Signed)
[place] [facility] Progress Note  Cytology still pending.  Will follow peripherally until this results. Dr. Lake Bells recommended oncology consultation regarding whether bronc with biopsy would change management versus considering hospice (favors hospice).  Follow up on onc recs.   No charge.   Montey Hora, [facility] For pager details, please see AMION or use Epic chat  After 1900, please call [facility] for cross coverage needs 08/13/2020, 9:13 AM

## 2020-08-13 NOTE — Progress Notes (Signed)
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  CT chest demonstrated large pericardial effusion and patient was transferred to [facility] for pericardiocentesis.  On 08/07/2020, patient underwent pericardiocentesis and during the procedure patient experienced vagal inhibition on advancement of the wire and drainage tube and went into PEA arrest requiring 3-4 minutes of CPR, epi x1 and atropine x1 with ROSC.  Patient was transferred to the CVICU and remained on ventilatory support.  Patient was extubated on 08/09/2020, and subsequently transferred to the hospitalist service on 08/11/2020.   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  History of lung cancer with recurrent malignant pleural effusion.  Chest x-ray with marked consolidation/opacification throughout the left hemithorax with minimal aeration near the lung apex on left.  Patient underwent left sided thoracentesis by PCCM with 50 cc of cloudy appearing fluid drained. --Albuterol MDI 2 puffs q6h prn wheezing/shortness of breath --Robitussin as needed --Continues on supplemental oxygen, maintain SPO2 greater than 92%; on 5L  nasal cannula with SPO2 100%; continue to wean oxygen today --Cytology pending from thoracentesis  Pericardial effusion PEA arrest Patient presenting to [facility] with shortness of breath and increased lower extreme edema.  CT imaging notable for large pericardial effusion.  Patient transferred to [facility] and underwent pericardiocentesis on 2/20, which was complicated by PEA arrest w/ ROSC achieved in 3-4 minutes.  Suspect malignant effusion from her underlying metastatic NSCLC.  Cytology from pericardial fluid with reactive mesothelial cells.  Pericardial drain now removed. --Cardiology following, Dr. [name]; appreciate assistance --Colchicine 0.6 mg p.o. daily --Indomethacin 50 mg p.o. twice daily --Pericardial fluid sent for molecular testing per oncology; currently pending  Stage IV NSCLC Patient is followed by medical oncology outpatient, Dr. [name], last seen in ofice on 06/21/2020.  CT chest without contrast on admission with left breast heterogeneous density with skin thickening concerning for carcinoma, large pericardial effusion suspicious for malignant, left greater than right heterogeneous interstitial airspace opacity, axillary/mediastinal lymphadenopathy, sclerotic lesions left second, right third and left seventh ribs and 6 mm left adrenal mass all concerning for metastatic disease.  Underwent pericardiocentesis with grossly bloody fluid on 6/21 as above.  Pulmonology believes her left lower lobe consolidation on CT of her chest is a mass. --continue Tagrisso 80mg  PO daily --Cytology from thoracentesis and molecular studies from pericardial fluid: Pending --Palliative care following, appreciate assistance: Concern for progression of metastatic disease and overall very poor/grim prognosis.  Pending cytology from thoracentesis on 6/25 and molecular studies from pericardial fluid,  --PCCM requesting oncology to weigh in on prognosis and further recommendations today; whether a bronchoscopy/biopsy  would change medical management or should hospice be a consideration.  Type 2 diabetes mellitus Hemoglobin A1c 6.3 on 08/06/2020, well controlled. --Carbohydrate/cardiac diet  Hypotension likely secondary to sedation  Hx Essential hypertension Patient on amlodipine 5 mg p.o. daily, hydralazine 25 mg p.o. twice daily, metoprolol tartrate 25 mg p.o. twice daily, and furosemide 20mg  PO daily at baseline.  Following PEA arrest, patient required ventilatory and vasopressor support with phenylephrine, which has now been titrated off.  Suspect her hypotension was related to her PEA arrest and sedation.  Now extubated. --BP 137/47 this morning. --Amlodipine 5 mg p.o. daily --Metoprolol tartrate 25 mg p.o. twice daily --Continue to hold hydralazine/furosemide --Monitor BP closely  Weakness, Debility, deconditioning: --PT/OT recommends SNF vs CIR placement --TOC for eval and placement --continue therapy efforts while inpatient  DVT prophylaxis: SCDs Start: 08/05/20 2221   Code Status: Full Code Family Communication: Updated patient's niece, [name] via telephone yesterday morning  Disposition Plan:  Level of care: Telemetry Cardiac Status is: Inpatient  Remains inpatient appropriate because:Ongoing diagnostic testing needed not appropriate for outpatient work up, Unsafe d/c plan, IV treatments appropriate due to intensity of illness or inability to take PO, and Inpatient level of care appropriate due to severity of illness  Dispo: The patient is from: Home              Anticipated d/c is to:  CIR vs SNF              Patient currently is not medically stable to d/c.   Difficult to place patient No  Consultants:  PCCM Cardiology, Dr. [name] Oncology  Procedures:  TTE 6/20, 6/22, 6/24 Pericardiocentesis 6/21 Thoracentesis 6/25  Antimicrobials:  Ceftriaxone 6/22 - 6/24   Subjective: Patient seen and examined at bedside, resting comfortably.  Sitting in bedside chair.  No complaints  this morning.  Denies chest pain or shortness of breath.  Awaiting for oncology input per PCCM regarding need for bronchoscopy/biopsy.  Continues on 5 L nasal cannula with SPO2 100%. Denies headache, no chest pain, no palpitations, no abdominal pain, no cough/congestion, no fever/chills/night sweats, no nausea/vomiting/diarrhea.  No acute concerns overnight per nurse staff.  Objective: Vitals:   08/13/20 0700 08/13/20 0731 08/13/20 0800 08/13/20 0900  BP: (!) 149/56  137/70 (!) 137/54  Pulse: 84  92 88  Resp: (!) 25  17 14   Temp:  97.9 F (36.6 C)    TempSrc:  Axillary    SpO2: 99%  100% 100%  Weight:      Height:        Intake/Output Summary (Last 24 hours) at 08/13/2020 0956 Last data filed at 08/13/2020 0800 Gross per 24 hour  Intake 480 ml  Output 590 ml  Net -110 ml   Filed Weights   08/07/20 0427 08/09/20 0335  Weight: 60.5 kg 62.8 kg    Examination:  General exam: Appears calm and comfortable, sitting in bedside chair, chronically ill in appearance Respiratory system: Breath sounds diminished left base, normal respiratory effort, on 5 L nasal cannula Cardiovascular system: S1 & S2 heard, RRR. No JVD, murmurs, rubs, gallops or clicks. No pedal edema.   Gastrointestinal system: Abdomen is nondistended, soft and nontender. No organomegaly or masses felt. Normal bowel sounds heard. Central nervous system: Alert and oriented. No focal neurological deficits. Extremities: Symmetric 5 x 5 power. Skin: No rashes, lesions or ulcers Psychiatry: Judgement and insight appear normal. Mood & affect appropriate.     Data Reviewed: I have personally reviewed following labs and imaging studies  CBC: Recent Labs  Lab 08/07/20 0246 08/07/20 1639 08/07/20 2009 08/08/20 0300 08/09/20 0647 08/10/20 0045 08/12/20 0030  WBC 4.6  --   --  7.0 9.8 10.5 5.6  NEUTROABS  --   --   --   --  7.9*  --   --   HGB 11.1*   < > 11.2* 10.4* 12.0 11.6* 11.4*  HCT 35.3*   < > 33.0* 32.2* 36.9  36.0 34.8*  MCV 91.5  --   --  90.7 90.0 89.8 89.5  PLT 216  --   --  211 201 190 237   < > = values in this interval not displayed.   Basic Metabolic Panel: Recent Labs  Lab 08/07/20 0246 08/07/20 1639 08/07/20 1640 08/07/20 1809 08/07/20 2009 08/09/20 0647 08/10/20 0045 08/11/20 0232 08/12/20 0030  NA 135   < > 141   < > 140 139 135 131* 135  K 3.9   < > 4.3   < > 4.4 3.9 4.0 4.0 4.0  CL 102  --  106  --   --  108 103 98 104  CO2 26  --   --   --   --  18* 22 24 25   GLUCOSE 149*  --  170*  --   --  96 160* 145* 194*  BUN 16  --  22  --   --  33* 47* 46* 33*  CREATININE 0.85  --  1.00  --   --  1.84* 1.67* 1.10* 0.86  CALCIUM 9.0  --   --   --   --  8.7* 8.8* 8.7* 8.6*  MG 2.0  --   --   --   --   --  1.9  --   --   PHOS 3.6  --   --   --   --   --  4.3  --   --    < > = values in this interval not displayed.   GFR: Estimated Creatinine Clearance: 47.3 mL/min (by C-G formula based on SCr of 0.86 mg/dL). Liver Function Tests: Recent Labs  Lab 08/09/20 0647  AST 45*  ALT 31  ALKPHOS 76  BILITOT 2.1*  PROT 6.5  ALBUMIN 2.4*   No results for input(s): LIPASE, AMYLASE in the last 168 hours. No results for input(s): AMMONIA in the last 168 hours. Coagulation Profile: No results for input(s): INR, PROTIME in the last 168 hours. Cardiac Enzymes: No results for input(s): CKTOTAL, CKMB, CKMBINDEX, TROPONINI in the last 168 hours. BNP (last 3 results) No results for input(s): PROBNP in the last 8760 hours. HbA1C: No results for input(s): HGBA1C in the last 72 hours. CBG: Recent Labs  Lab 08/11/20 2157 08/12/20 0641 08/12/20 1128 08/12/20 1625 08/12/20 1958  GLUCAP 175* 144* 173* 135* 216*   Lipid Profile: No results for input(s): CHOL, HDL, LDLCALC, TRIG, CHOLHDL, LDLDIRECT in the last 72 hours. Thyroid Function Tests: No results for input(s): TSH, T4TOTAL, FREET4, T3FREE, THYROIDAB in the last 72 hours. Anemia Panel: No results for input(s): VITAMINB12,  FOLATE, FERRITIN, TIBC, IRON, RETICCTPCT in the last 72 hours. Sepsis Labs: No results for input(s): PROCALCITON, LATICACIDVEN in the last 168 hours.   Recent Results (from the past 240 hour(s))  Resp Panel by RT-PCR (Flu A&B, Covid) Nasopharyngeal Swab     Status: None   Collection Time: 08/05/20  6:30 PM   Specimen: Nasopharyngeal Swab; Nasopharyngeal(NP) swabs in vial transport medium  Result Value Ref Range Status   SARS Coronavirus 2 by RT PCR NEGATIVE NEGATIVE Final    Comment: (NOTE) SARS-CoV-2 target nucleic acids are NOT DETECTED.  The SARS-CoV-2  RNA is generally detectable in upper respiratory specimens during the acute phase of infection. The lowest concentration of SARS-CoV-2 viral copies this assay can detect is 138 copies/mL. A negative result does not preclude SARS-Cov-2 infection and should not be used as the sole basis for treatment or other patient management decisions. A negative result may occur with  improper specimen collection/handling, submission of specimen other than nasopharyngeal swab, presence of viral mutation(s) within the areas targeted by this assay, and inadequate number of viral copies(<138 copies/mL). A negative result must be combined with clinical observations, patient history, and epidemiological information. The expected result is Negative.  Fact Sheet for Patients:  EntrepreneurPulse.com.au  Fact Sheet for Healthcare Providers:  IncredibleEmployment.be  This test is no t yet approved or cleared by the [place] FDA and  has been authorized for detection and/or diagnosis of SARS-CoV-2 by FDA under an Emergency Use Authorization (EUA). This EUA will remain  in effect (meaning this test can be used) for the duration of the COVID-19 declaration under Section 564(b)(1) of the Act, 21 U.S.C.section 360bbb-3(b)(1), unless the authorization is terminated  or revoked sooner.       Influenza A by PCR  NEGATIVE NEGATIVE Final   Influenza B by PCR NEGATIVE NEGATIVE Final    Comment: (NOTE) The Xpert Xpress SARS-CoV-2/FLU/RSV plus assay is intended as an aid in the diagnosis of influenza from Nasopharyngeal swab specimens and should not be used as a sole basis for treatment. Nasal washings and aspirates are unacceptable for Xpert Xpress SARS-CoV-2/FLU/RSV testing.  Fact Sheet for Patients: EntrepreneurPulse.com.au  Fact Sheet for Healthcare Providers: IncredibleEmployment.be  This test is not yet approved or cleared by the [place] FDA and has been authorized for detection and/or diagnosis of SARS-CoV-2 by FDA under an Emergency Use Authorization (EUA). This EUA will remain in effect (meaning this test can be used) for the duration of the COVID-19 declaration under Section 564(b)(1) of the Act, 21 U.S.C. section 360bbb-3(b)(1), unless the authorization is terminated or revoked.  Performed at [facility], [place] [address] [place], [place] [address]   Culture, blood (routine x 2)     Status: None   Collection Time: 08/05/20  8:18 PM   Specimen: BLOOD  Result Value Ref Range Status   Specimen Description   Final    BLOOD RIGHT ARM Performed at [facility] [address] [place], [place] [address]    Special Requests   Final    BOTTLES DRAWN AEROBIC AND ANAEROBIC Blood Culture results may not be optimal due to an excessive volume of blood received in culture bottles Performed at [facility] [address] [place], [place] [address]    Culture   Final    NO GROWTH 5 DAYS Performed at [facility], [place] [address] [place], [place] [address]    Report Status 08/11/2020 FINAL  Final  Culture, blood (routine x 2)     Status: None   Collection Time: 08/05/20  9:00 PM   Specimen: BLOOD  Result Value Ref Range Status   Specimen Description   Final    BLOOD BLOOD RIGHT  HAND Performed at [facility] [address], [place], [place] [address]    Special Requests   Final    BOTTLES DRAWN AEROBIC AND ANAEROBIC Blood Culture adequate volume Performed at [facility] [address], [place],  [address]    Culture   Final    NO  GROWTH 5 DAYS Performed at [facility], [place] [address], [place], [place] [address]    Report Status 08/11/2020 FINAL  Final  Culture, body fluid w Gram Stain-bottle     Status: None   Collection Time: 08/07/20  3:25 PM   Specimen: Fluid  Result Value Ref Range Status   Specimen Description FLUID PERICARDIAL  Final   Special Requests BOTTLES DRAWN AEROBIC AND ANAEROBIC  Final   Culture   Final    NO GROWTH 5 DAYS Performed at [facility], [place] [address] [place], [place] [address]    Report Status 08/12/2020 FINAL  Final  Gram stain     Status: None   Collection Time: 08/07/20  3:25 PM   Specimen: Fluid  Result Value Ref Range Status   Specimen Description FLUID PERICARDIAL  Final   Special Requests NONE  Final   Gram Stain   Final    RARE WBC PRESENT,BOTH PMN AND MONONUCLEAR NO ORGANISMS SEEN Performed at [facility], [place] [address] [place], [place] [address]    Report Status 08/07/2020 FINAL  Final  MRSA Next Gen by PCR, Nasal     Status: None   Collection Time: 08/07/20  5:36 PM   Specimen: Nasal Mucosa; Nasal Swab  Result Value Ref Range Status   MRSA by PCR Next Gen NOT DETECTED NOT DETECTED Final    Comment: (NOTE) The GeneXpert MRSA Assay (FDA approved for NASAL specimens only), is one component of a comprehensive MRSA colonization surveillance program. It is not intended to diagnose MRSA infection nor to guide or monitor treatment for MRSA infections. Test performance is not FDA approved in patients less than 66 years old. Performed at [facility], [place] [address], [place], [place] [address]   Culture, Respiratory w Gram Stain      Status: None   Collection Time: 08/09/20 10:35 AM   Specimen: Tracheal Aspirate; Respiratory  Result Value Ref Range Status   Specimen Description TRACHEAL ASPIRATE  Final   Special Requests NONE  Final   Gram Stain   Final    FEW WBC PRESENT,BOTH PMN AND MONONUCLEAR NO ORGANISMS SEEN    Culture   Final    RARE Normal respiratory flora-no Staph aureus or Pseudomonas seen Performed at [facility], [address], [place], [place] [address]    Report Status 08/11/2020 FINAL  Final         Radiology Studies: DG Chest 1 View  Result Date: 08/11/2020 CLINICAL DATA:  Shortness of breath.  Thoracentesis EXAM: CHEST  1 VIEW COMPARISON:  Yesterday FINDINGS: Negative for pneumothorax. Continued extensive opacification of the left chest from pulmonary opacity and small loculated pleural effusion by CT. Cardiopericardial enlargement with large pericardial effusion by CT. Improved aeration on the right, although persistent perihilar infiltrate. IMPRESSION: 1. No acute finding related to thoracentesis. 2. No detected change from yesterday other than improved lung volumes. Electronically Signed   By: Monte Fantasia M.D.   On: 08/11/2020 10:29        Scheduled Meds:  amLODipine  5 mg Oral Daily   Chlorhexidine Gluconate Cloth  6 each Topical Daily   colchicine  0.6 mg Oral Daily   indomethacin  50 mg Oral BID WC   metoprolol tartrate  25 mg Oral BID   osimertinib mesylate  80 mg Oral Daily   Continuous Infusions:  sodium chloride Stopped (08/09/20 1528)     LOS: 8 days  Time spent: 36 minutes spent on chart review, discussion with nursing staff, consultants, updating family and interview/physical exam; more than 50% of that time was spent in counseling and/or coordination of care.    Markita Stcharles J [place] DO [facility] Available via Epic secure chat 7am-7pm After these hours, please refer to coverage provider listed on [email or web address] 08/13/2020, 9:56 AM

## 2020-08-13 NOTE — Progress Notes (Signed)
Patient transferred to 3E23 on telemetry with belongings. Tasha RN to receive patient.

## 2020-08-14 ENCOUNTER — Encounter: Payer: Self-pay | Admitting: *Deleted

## 2020-08-14 ENCOUNTER — Ambulatory Visit: Payer: Self-pay | Admitting: Hematology and Oncology

## 2020-08-14 ENCOUNTER — Other Ambulatory Visit: Payer: Self-pay

## 2020-08-14 DIAGNOSIS — J910 Malignant pleural effusion: Secondary | ICD-10-CM

## 2020-08-14 LAB — GLUCOSE, CAPILLARY
Glucose-Capillary: 175 mg/dL — ABNORMAL HIGH (ref 70–99)
Glucose-Capillary: 180 mg/dL — ABNORMAL HIGH (ref 70–99)
Glucose-Capillary: 157 mg/dL — ABNORMAL HIGH (ref 70–99)
Glucose-Capillary: 159 mg/dL — ABNORMAL HIGH (ref 70–99)

## 2020-08-14 MED ORDER — INSULIN ASPART 100 UNIT/ML IJ SOLN: 0.0000 [IU] | Freq: Every day | INTRAMUSCULAR | Status: DC

## 2020-08-14 MED ORDER — INSULIN ASPART 100 UNIT/ML IJ SOLN
0.0000 [IU] | Freq: Three times a day (TID) | INTRAMUSCULAR | Status: DC
  Administered 2020-08-14 – 2020-08-17 (×6): 1 [IU] via SUBCUTANEOUS

## 2020-08-14 MED ORDER — COLCHICINE 0.3 MG HALF TABLET
0.3000 mg | ORAL_TABLET | Freq: Every day | ORAL | Status: DC
  Administered 2020-08-15 – 2020-08-17 (×3): 0.3 mg via ORAL
  Filled 2020-08-14 (×3): qty 1

## 2020-08-14 NOTE — Progress Notes (Signed)
This chaplain is present to offer a spiritual care presence.  The Pt. is sitting up in the bedside recliner enjoying a favorite program on TV.  This chaplain will return for a visit tomorrow.

## 2020-08-14 NOTE — NC FL2 (Addendum)
[place] LEVEL OF CARE SCREENING TOOL     IDENTIFICATION  Patient Name: Sara Ferguson Birthdate: Feb 16, 1946 Sex: female Admission Date (Current Location): 08/05/2020  [facility] and [place] Number:  Herbalist and Address:  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[place]) 08/05/2020   Hypokalemia 01/25/2020   Volume overload 01/25/2020   Adenocarcinoma of left lung, stage 4 ([place]) 01/13/2020   Goals of care, counseling/discussion 01/13/2020   Pleural effusion, left 12/31/2019   Type 2 diabetes mellitus without complication ([place]) 84/69/6295   Acute respiratory failure with hypoxia ([place]) 12/31/2019   Pericardial effusion 12/31/2019   Essential hypertension 12/30/2019   Pleural effusion, malignant 12/30/2019   Pleural effusion 12/30/2019    Orientation RESPIRATION BLADDER Height & Weight     Self, Time, Situation, Place  O2 Incontinent, External catheter Weight: 134 lb 12.8 oz (61.1 kg) Height:  5\' 4"  (162.6 cm)  BEHAVIORAL SYMPTOMS/MOOD NEUROLOGICAL BOWEL NUTRITION STATUS      Incontinent Diet (DC Summary)  AMBULATORY STATUS COMMUNICATION OF NEEDS Skin   Extensive Assist Verbally Normal                       Personal Care Assistance Level of Assistance  Bathing, Feeding, Dressing Bathing Assistance: Maximum assistance Feeding assistance: Limited assistance Dressing Assistance: Maximum assistance      Functional Limitations Info  Sight, Hearing, Speech Sight Info: Adequate Hearing Info: Adequate Speech Info: Adequate    SPECIAL CARE FACTORS FREQUENCY  PT (By licensed PT), OT (By licensed OT)     PT Frequency: 5x a week OT Frequency: 5x a week            Contractures Contractures Info: Not present    Additional Factors Info  Code Status, Allergies Code Status Info: Full Allergies Info: NKA           Current Medications (08/14/2020):  This is the current hospital active medication list Current Facility-Administered Medications  Medication Dose Route Frequency Provider Last Rate Last Admin   0.9 %  sodium chloride infusion  250 mL Intravenous Continuous Agarwala, Einar Grad, MD   Stopped at 08/09/20 1528   acetaminophen (TYLENOL) tablet 650 mg  650 mg Oral Q6H PRN Kipp Brood, MD   650 mg at 08/12/20 2241   Or   acetaminophen (TYLENOL) suppository 650 mg  650 mg Rectal Q6H PRN Agarwala, Einar Grad, MD       albuterol (VENTOLIN HFA) 108 (90 Base) MCG/ACT inhaler 2 puff  2 puff Inhalation Q6H PRN Agarwala, Ravi, MD       amLODipine (NORVASC) tablet 5 mg  5 mg Oral Daily [place] Eric J, DO   5 mg at 08/14/20 1058   Chlorhexidine Gluconate Cloth 2 % PADS 6 each  6 each Topical Daily Kipp Brood, MD   6 each at 08/14/20 1119   colchicine tablet 0.6 mg  0.6 mg Oral Daily Dixie Dials,  MD   0.6 mg at 08/14/20 1059   guaiFENesin (ROBITUSSIN) 100 MG/5ML solution 200 mg  200 mg Oral TID PRN Kipp Brood, MD       hydrALAZINE (APRESOLINE) tablet 25 mg  25 mg Oral Q6H PRN Agarwala, Einar Grad, MD       hydrocortisone cream 1 % 1 application  1 application Topical Daily PRN Agarwala, Einar Grad, MD       indomethacin (INDOCIN) capsule 50 mg  50 mg Oral BID WC Dixie Dials, MD   50 mg at 08/14/20 0834   insulin aspart (novoLOG) injection 0-5 Units  0-5 Units Subcutaneous QHS [place] Eric J, DO       insulin aspart (novoLOG) injection 0-6 Units  0-6 Units Subcutaneous TID WC [place] Donnamarie Poag, DO    1 Units at 08/14/20 1110   metoprolol tartrate (LOPRESSOR) tablet 25 mg  25 mg Oral BID Dixie Dials, MD   25 mg at 08/14/20 1058   ondansetron (ZOFRAN) tablet 4 mg  4 mg Oral Q6H PRN Kipp Brood, MD       Or   ondansetron (ZOFRAN) injection 4 mg  4 mg Intravenous Q6H PRN Agarwala, Einar Grad, MD       osimertinib mesylate (TAGRISSO) tablet 80 mg  80 mg Oral Daily Dixie Dials, MD   80 mg at 08/14/20 1104     Discharge Medications: Please see discharge summary for a list of discharge medications.  Relevant Imaging Results:  Relevant Lab Results:   Additional Information SSN: [id number]  Reece Agar, LCSWA

## 2020-08-14 NOTE — Progress Notes (Signed)
Physical Therapy Treatment Patient Details Name: Sara Ferguson MRN: [id number] DOB: Mar 26, 1945 Today's Date: 08/14/2020    History of Present Illness Pt is a 75 y/o female presenting on 6/19 to ER with 1 week of progressive shortness of breath, LE edema, and chest discomfort.  Pt currently on oral chemotherapy. CT chest shows left breast heterogeneous density with skin thickening concerning for carcinoma, left pericardial effusion  and left greater than right heterogeneous interstitial and airspace opacity possibly pulmonary edema.  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  Also left adrenal mass concerning for metastatic disease. Pt admitted for pericardial effusion. Pt underwent pericardiocentesis for pericardial effusion with PEA arrest (3-4 min CPR). Pt extubated 6/23. Pending cytology from thoracentesis on 6/25 and molecular studies from pericardial fluid. PMH: non-small cell lung cancer stage IV, recurrent malignant pleural effusion, diabetes, essential hypertension, osteoarthritis.    PT Comments    Pt received in supine, agreeable to therapy and with good participation and tolerance for transfer and gait training. Increased processing time needed for all tasks. Pt needing up to +2 minA due to poor seated/standing balance and quick to fatigue, did utilize chair follow for gait and with noted poor RW management needs manual assist for safety with device but she was able to progress gait distance to 16ft this date, an improvement from previous session. Pt agreeable to sit up in chair at end of session, chair alarm on due to pt high fall risk. Pt continues to benefit from PT services to progress toward functional mobility goals.    Follow Up Recommendations  SNF;Supervision for mobility/OOB     Equipment Recommendations  3in1 (PT);Wheelchair (measurements PT);Wheelchair cushion (measurements PT)    Recommendations for Other Services       Precautions  / Restrictions Precautions Precautions: Fall;Other (comment) Precaution Comments: monitor O2, drain Restrictions Weight Bearing Restrictions: No    Mobility  Bed Mobility Overal bed mobility: Needs Assistance Bed Mobility: Supine to Sit     Supine to sit: Min guard;HOB elevated     General bed mobility comments: cues for self-assist, use of bed rail and increased time to perform    Transfers Overall transfer level: Needs assistance Equipment used: Rolling walker (2 wheeled) Transfers: Sit to/from Stand Sit to Stand: Min assist;+2 safety/equipment Stand pivot transfers: Min assist       General transfer comment: verbal and tactile cues for hand placement. Pt attempting to stand by pulling on walker. MinA to power to stand from EOB x2 reps and for controlled lowering to bed/chair  Ambulation/Gait Ambulation/Gait assistance: Min assist;+2 safety/equipment Gait Distance (Feet): 22 Feet Assistive device: Rolling walker (2 wheeled) Gait Pattern/deviations: Shuffle;Decreased stride length;Trunk flexed;Decreased step length - right;Decreased step length - left Gait velocity: reduced   General Gait Details: Pt with slow shuffling steps, needs up to minA more for RW management (pt tending to hold RW way out in front of her) and posture; chair follow for safety   Stairs Stairs:  (pt unable)           Wheelchair Mobility    Modified Rankin (Stroke Patients Only)       Balance Overall balance assessment: Needs assistance Sitting-balance support: No upper extremity supported;Feet supported Sitting balance-Leahy Scale: Fair Sitting balance - Comments: slouched posture but able to sit upright with cues and 1-2 UE support   Standing balance support: Bilateral upper extremity supported;During functional activity Standing balance-Leahy Scale: Poor Standing balance comment: reliant on UE and external support,  poor posture with RW support                             Cognition Arousal/Alertness: Awake/alert Behavior During Therapy: Flat affect Overall Cognitive Status: No family/caregiver present to determine baseline cognitive functioning Area of Impairment: Memory;Following commands;Safety/judgement;Awareness;Problem solving                     Memory: Decreased short-term memory Following Commands: Follows one step commands with increased time Safety/Judgement: Decreased awareness of deficits;Decreased awareness of safety Awareness: Emergent Problem Solving: Requires tactile cues;Requires verbal cues;Slow processing General Comments: Pt with slow processing but motivated to participate and work on strengthening. Some memory deficits noted/decreased awareness, pt needs assist to fix nasal cannula which wasn't properly adjusted in nose and was very slouched in bed when PTA arrived with neck craned to side, seemingly unconcerned.      Exercises General Exercises - Lower Extremity Ankle Circles/Pumps: AROM;Both;10 reps;Seated Long Arc Quad: AROM;Both;10 reps;Seated Hip Flexion/Marching: AROM;Both;10 reps;Seated Other Exercises Other Exercises: seated BUE AROM: shoulder press, bicep curls, wrist flex/ext, shoulder abduction with elbows flexed x10 reps ea    General Comments General comments (skin integrity, edema, etc.): SpO2 92% on 2L [place] with exertion; HR 90's bpm with exertion; no dizziness and BP not further assessed, stable per chart review      Pertinent Vitals/Pain Pain Assessment: Faces Faces Pain Scale: Hurts a little bit Pain Location: chest with deep breaths Pain Descriptors / Indicators: Sore Pain Intervention(s): Limited activity within patient's tolerance;Monitored during session;Repositioned    Home Living                      Prior Function            PT Goals (current goals can now be found in the care plan section) Acute Rehab PT Goals Patient Stated Goal: get stronger at SNF then go home PT Goal  Formulation: With patient Time For Goal Achievement: 08/24/20 Progress towards PT goals: Progressing toward goals    Frequency    Min 2X/week      PT Plan Current plan remains appropriate    Co-evaluation              AM-PAC PT "6 Clicks" Mobility   Outcome Measure  Help needed turning from your back to your side while in a flat bed without using bedrails?: A Little Help needed moving from lying on your back to sitting on the side of a flat bed without using bedrails?: A Little Help needed moving to and from a bed to a chair (including a wheelchair)?: A Little Help needed standing up from a chair using your arms (e.g., wheelchair or bedside chair)?: A Little Help needed to walk in hospital room?: A Lot Help needed climbing 3-5 steps with a railing? : Total 6 Click Score: 15    End of Session Equipment Utilized During Treatment: Gait belt;Oxygen Activity Tolerance: Patient limited by fatigue Patient left: in chair;with chair alarm set;with call bell/phone within reach Nurse Communication: Mobility status PT Visit Diagnosis: Unsteadiness on feet (R26.81);Other abnormalities of gait and mobility (R26.89);Muscle weakness (generalized) (M62.81);Pain Pain - Right/Left:  (chest) Pain - part of body:  (chest)     Time: 8413-2440 PT Time Calculation (min) (ACUTE ONLY): 25 min  Charges:  $Gait Training: 8-22 mins $Therapeutic Activity: 8-22 mins  Houston Siren., PTA Acute Rehabilitation Services Pager: [phone] Office: [place] 08/14/2020, 5:11 PM

## 2020-08-14 NOTE — Progress Notes (Signed)
I was updated by Mikey Bussing NP that patient pleural fluid is positive for malignancy and pericardial fluid is negative.  I updated pathology to send pleural fluid instead of pericardial fluid.

## 2020-08-14 NOTE — Consult Note (Signed)
Ref: [name], MD   Subjective:  No chest pain. No shortness of breath. VS stable. Monitor shows sinus rhythm.  Objective:  Vital Signs in the last 24 hours: Temp:  [97.9 F (36.6 C)-98.3 F (36.8 C)] 98.3 F (36.8 C) (06/28 1935) Pulse Rate:  [82-92] 92 (06/28 1935) Cardiac Rhythm: Normal sinus rhythm (06/28 1900) Resp:  [16-20] 20 (06/28 1935) BP: (143-160)/(56-69) 145/69 (06/28 1935) SpO2:  [98 %-100 %] 100 % (06/28 1935) Weight:  [61.1 kg] 61.1 kg (06/28 0516)  Physical Exam: BP Readings from Last 1 Encounters:  08/14/20 (!) 145/69     Wt Readings from Last 1 Encounters:  08/14/20 61.1 kg    Weight change:  Body mass index is 23.14 kg/m. HEENT: [place]/AT, Eyes-Brown, PERL, EOMI, Conjunctiva-Pink, Sclera-Non-icteric Neck: No JVD, No bruit, Trachea midline. Lungs:  Clear, Bilateral. Cardiac:  Regular rhythm, normal S1 and S2, no S3. II/VI systolic murmur. Abdomen:  Soft, non-tender. BS present. Extremities:  No edema present. No cyanosis. No clubbing. CNS: AxOx3, Cranial nerves grossly intact, moves all 4 extremities.  Skin: Warm and dry.   Intake/Output from previous day: 06/27 0701 - 06/28 0700 In: 360 [P.O.:360] Out: -     Lab Results: BMET    Component Value Date/Time   NA 135 08/12/2020 0030   NA 131 (L) 08/11/2020 0232   NA 135 08/10/2020 0045   K 4.0 08/12/2020 0030   K 4.0 08/11/2020 0232   K 4.0 08/10/2020 0045   CL 104 08/12/2020 0030   CL 98 08/11/2020 0232   CL 103 08/10/2020 0045   CO2 25 08/12/2020 0030   CO2 24 08/11/2020 0232   CO2 22 08/10/2020 0045   GLUCOSE 194 (H) 08/12/2020 0030   GLUCOSE 145 (H) 08/11/2020 0232   GLUCOSE 160 (H) 08/10/2020 0045   BUN 33 (H) 08/12/2020 0030   BUN 46 (H) 08/11/2020 0232   BUN 47 (H) 08/10/2020 0045   CREATININE 0.86 08/12/2020 0030   CREATININE 1.10 (H) 08/11/2020 0232   CREATININE 1.67 (H) 08/10/2020 0045   CREATININE 0.79 04/26/2020 1513   CREATININE 0.81 03/29/2020 1621   CREATININE  0.77 03/01/2020 1623   CALCIUM 8.6 (L) 08/12/2020 0030   CALCIUM 8.7 (L) 08/11/2020 0232   CALCIUM 8.8 (L) 08/10/2020 0045   GFRNONAA >60 08/12/2020 0030   GFRNONAA 52 (L) 08/11/2020 0232   GFRNONAA 31 (L) 08/10/2020 0045   GFRNONAA >60 04/26/2020 1513   GFRNONAA >60 03/29/2020 1621   GFRNONAA >60 03/01/2020 1623   CBC    Component Value Date/Time   WBC 5.6 08/12/2020 0030   RBC 3.89 08/12/2020 0030   HGB 11.4 (L) 08/12/2020 0030   HCT 34.8 (L) 08/12/2020 0030   PLT 237 08/12/2020 0030   MCV 89.5 08/12/2020 0030   MCH 29.3 08/12/2020 0030   MCHC 32.8 08/12/2020 0030   RDW 13.2 08/12/2020 0030   LYMPHSABS 0.9 08/09/2020 0647   MONOABS 1.0 08/09/2020 0647   EOSABS 0.0 08/09/2020 0647   BASOSABS 0.0 08/09/2020 0647   HEPATIC Function Panel Recent Labs    05/24/20 1517 08/06/20 0330 08/09/20 0647  PROT 8.7* 7.4 6.5   HEMOGLOBIN A1C No components found for: HGA1C,  MPG CARDIAC ENZYMES No results found for: CKTOTAL, CKMB, CKMBINDEX, TROPONINI BNP No results for input(s): PROBNP in the last 8760 hours. TSH No results for input(s): TSH in the last 8760 hours. CHOLESTEROL No results for input(s): CHOL in the last 8760 hours.  Scheduled Meds:  amLODipine  5 mg Oral Daily   Chlorhexidine Gluconate Cloth  6 each Topical Daily   [START ON 08/15/2020] colchicine  0.3 mg Oral Daily   indomethacin  50 mg Oral BID WC   insulin aspart  0-5 Units Subcutaneous QHS   insulin aspart  0-6 Units Subcutaneous TID WC   metoprolol tartrate  25 mg Oral BID   osimertinib mesylate  80 mg Oral Daily   Continuous Infusions:  sodium chloride Stopped (08/09/20 1528)   PRN Meds:.acetaminophen **OR** acetaminophen, albuterol, guaiFENesin, hydrALAZINE, hydrocortisone cream, ondansetron **OR** ondansetron (ZOFRAN) IV  Assessment/Plan:  Adenocarcinoma of left lung with mets Chronic pericardial effusion S/P pericardiocentesis S/P pericardial drain Type 2 DM HTN  Limited echocardiogram  for recurrence of pericardial effusion if any.   LOS: 9 days   Time spent including chart review, lab review, examination, discussion with patient : 25 min   Dixie Dials  MD  08/14/2020, 8:36 PM

## 2020-08-14 NOTE — Care Management Important Message (Signed)
Important Message  Patient Details  Name: Sara Ferguson MRN: [id number] Date of Birth: August 27, 1945   Medicare Important Message Given:  Yes     Orbie Pyo 08/14/2020, 2:42 PM

## 2020-08-14 NOTE — Progress Notes (Signed)
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  CT chest demonstrated large pericardial effusion and patient was transferred to [facility] for pericardiocentesis.  On 08/07/2020, patient underwent pericardiocentesis and during the procedure patient experienced vagal inhibition on advancement of the wire and drainage tube and went into PEA arrest requiring 3-4 minutes of CPR, epi x1 and atropine x1 with ROSC.  Patient was transferred to the CVICU and remained on ventilatory support.  Patient was extubated on 08/09/2020, and subsequently transferred to the hospitalist service on 08/11/2020.   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  History of lung cancer with recurrent malignant pleural effusion.  Chest x-ray with marked consolidation/opacification throughout the left hemithorax with minimal aeration near the lung apex on left.  Patient underwent left sided thoracentesis by PCCM with 50 cc of cloudy appearing fluid drained; cytology positive for malignant cells consistent with adenocarcinoma. --Albuterol MDI 2 puffs q6h prn wheezing/shortness of breath --Robitussin as  needed --Continues on supplemental oxygen, maintain SPO2 greater than 92%; on 5L nasal cannula with SPO2 100%; continue to wean oxygen today --Pleural fluid sent for molecular studies per oncology  Pericardial effusion PEA arrest Patient presenting to [facility] with shortness of breath and increased lower extreme edema.  CT imaging notable for large pericardial effusion.  Patient transferred to [facility] and underwent pericardiocentesis on 9/67, which was complicated by PEA arrest w/ ROSC achieved in 3-4 minutes.  Suspect malignant effusion from her underlying metastatic NSCLC.  Cytology from pericardial fluid with reactive mesothelial cells.  Pericardial drain now removed. --Cardiology following, Dr. [name]; appreciate assistance --Colchicine 0.6 mg p.o. daily --Indomethacin 50 mg p.o. twice daily  Stage IV NSCLC Patient is followed by medical oncology outpatient, Dr. [name], last seen in ofice on 06/21/2020.  CT chest without contrast on admission with left breast heterogeneous density with skin thickening concerning for carcinoma, large pericardial effusion suspicious for malignant, left greater than right heterogeneous interstitial airspace opacity, axillary/mediastinal lymphadenopathy, sclerotic lesions left second, right third and left seventh ribs and 6 mm left adrenal mass all concerning for metastatic disease.  Underwent pericardiocentesis with grossly bloody fluid on 6/21 as above.  Pulmonology believes her left lower lobe consolidation on CT of her chest is a mass. --continue Tagrisso 80mg  PO daily --Cytology from thoracentesis and molecular studies from pericardial fluid: Pending --Palliative care following, appreciate assistance: Concern for progression of metastatic disease and overall very poor/grim prognosis.  Pending cytology from thoracentesis on 6/25 and molecular studies from pericardial fluid,  --PCCM requesting oncology to weigh in on prognosis and further recommendations today; whether a  bronchoscopy/biopsy would change medical management or should hospice be a consideration.  Type 2 diabetes mellitus Hemoglobin A1c 6.3 on 08/06/2020, well controlled. --Carbohydrate/cardiac diet  Hypotension likely secondary  to sedation Hx Essential hypertension Patient on amlodipine 5 mg p.o. daily, hydralazine 25 mg p.o. twice daily, metoprolol tartrate 25 mg p.o. twice daily, and furosemide 20mg  PO daily at baseline.  Following PEA arrest, patient required ventilatory and vasopressor support with phenylephrine, which has now been titrated off.  Suspect her hypotension was related to her PEA arrest and sedation.  Now extubated. --BP 137/47 this morning. --Amlodipine 5 mg p.o. daily --Metoprolol tartrate 25 mg p.o. twice daily --Continue to hold hydralazine/furosemide --Monitor BP closely  Weakness, Debility, deconditioning: --PT/OT recommends SNF vs CIR placement --TOC for eval and placement --continue therapy efforts while inpatient  DVT prophylaxis: SCDs Start: 08/05/20 2221   Code Status: Full Code Family Communication: Updated patient's niece, Sara Ferguson via telephone this morning  Disposition Plan:  Level of care: Telemetry Cardiac Status is: Inpatient  Remains inpatient appropriate because:Ongoing diagnostic testing needed not appropriate for outpatient work up, Unsafe d/c plan, IV treatments appropriate due to intensity of illness or inability to take PO, and Inpatient level of care appropriate due to severity of illness  Dispo: The patient is from: Home              Anticipated d/c is to:  CIR vs SNF              Patient currently is medically stable to d/c.   Difficult to place patient No  Consultants:  PCCM Cardiology, Dr. [name] Oncology  Procedures:  TTE 6/20, 6/22, 6/24 Pericardiocentesis 6/21 Thoracentesis 6/25  Antimicrobials:  Ceftriaxone 6/22 - 6/24   Subjective: Patient seen and examined at bedside, resting comfortably.  Sitting in bedside chair.  No  complaints this morning.  Denies chest pain or shortness of breath.  Awaiting for oncology input per PCCM regarding need for bronchoscopy/biopsy; as cytology from thoracentesis with malignant cells consistent with adenocarcinoma.  Continues on 5 L nasal cannula with SPO2 100%.  Awaiting evaluation by inpatient rehabilitation. Patient denies headache, no chest pain, no palpitations, no abdominal pain, no cough/congestion, no fever/chills/night sweats, no nausea/vomiting/diarrhea.  No acute concerns overnight per nurse staff.  Objective: Vitals:   08/13/20 1945 08/14/20 0000 08/14/20 0516 08/14/20 0731  BP: (!) 169/81 (!) 143/61 (!) 144/56 (!) 147/60  Pulse: 97 82 82 87  Resp: (!) 24 19 17 16   Temp: 98.4 F (36.9 C) 98 F (36.7 C) 97.9 F (36.6 C) 98.1 F (36.7 C)  TempSrc: Oral Oral Oral Oral  SpO2: 98% 98% 100% 99%  Weight:   61.1 kg   Height:        Intake/Output Summary (Last 24 hours) at 08/14/2020 1011 Last data filed at 08/14/2020 0837 Gross per 24 hour  Intake 240 ml  Output --  Net 240 ml   Filed Weights   08/09/20 0335 08/13/20 1032 08/14/20 0516  Weight: 62.8 kg 60.8 kg 61.1 kg    Examination:  General exam: Appears calm and comfortable, sitting in bedside chair, chronically ill in appearance Respiratory system: Breath sounds diminished left base, normal respiratory effort, on 5 L nasal cannula with SPO2 100%. Cardiovascular system: S1 & S2 heard, RRR. No JVD, murmurs, rubs, gallops or clicks. No pedal edema.   Gastrointestinal system: Abdomen is nondistended, soft and nontender. No organomegaly or masses felt. Normal bowel sounds heard. Central nervous system: Alert and oriented. No focal neurological deficits. Extremities: Symmetric 5 x 5 power. Skin: No rashes, lesions or ulcers Psychiatry: Judgement and insight appear normal. Mood & affect appropriate.     Data Reviewed:  I have personally reviewed following labs and imaging studies  CBC: Recent Labs  Lab  08/07/20 2009 08/08/20 0300 08/09/20 0647 08/10/20 0045 08/12/20 0030  WBC  --  7.0 9.8 10.5 5.6  NEUTROABS  --   --  7.9*  --   --   HGB 11.2* 10.4* 12.0 11.6* 11.4*  HCT 33.0* 32.2* 36.9 36.0 34.8*  MCV  --  90.7 90.0 89.8 89.5  PLT  --  211 201 190 505   Basic Metabolic Panel: Recent Labs  Lab 08/07/20 1640 08/07/20 1809 08/07/20 2009 08/09/20 0647 08/10/20 0045 08/11/20 0232 08/12/20 0030  NA 141   < > 140 139 135 131* 135  K 4.3   < > 4.4 3.9 4.0 4.0 4.0  CL 106  --   --  108 103 98 104  CO2  --   --   --  18* 22 24 25   GLUCOSE 170*  --   --  96 160* 145* 194*  BUN 22  --   --  33* 47* 46* 33*  CREATININE 1.00  --   --  1.84* 1.67* 1.10* 0.86  CALCIUM  --   --   --  8.7* 8.8* 8.7* 8.6*  MG  --   --   --   --  1.9  --   --   PHOS  --   --   --   --  4.3  --   --    < > = values in this interval not displayed.   GFR: Estimated Creatinine Clearance: 47.3 mL/min (by C-G formula based on SCr of 0.86 mg/dL). Liver Function Tests: Recent Labs  Lab 08/09/20 0647  AST 45*  ALT 31  ALKPHOS 76  BILITOT 2.1*  PROT 6.5  ALBUMIN 2.4*   No results for input(s): LIPASE, AMYLASE in the last 168 hours. No results for input(s): AMMONIA in the last 168 hours. Coagulation Profile: No results for input(s): INR, PROTIME in the last 168 hours. Cardiac Enzymes: No results for input(s): CKTOTAL, CKMB, CKMBINDEX, TROPONINI in the last 168 hours. BNP (last 3 results) No results for input(s): PROBNP in the last 8760 hours. HbA1C: No results for input(s): HGBA1C in the last 72 hours. CBG: Recent Labs  Lab 08/13/20 0729 08/13/20 1134 08/13/20 1641 08/13/20 2125 08/14/20 0608  GLUCAP 147* 211* 221* 181* 175*   Lipid Profile: No results for input(s): CHOL, HDL, LDLCALC, TRIG, CHOLHDL, LDLDIRECT in the last 72 hours. Thyroid Function Tests: No results for input(s): TSH, T4TOTAL, FREET4, T3FREE, THYROIDAB in the last 72 hours. Anemia Panel: No results for input(s):  VITAMINB12, FOLATE, FERRITIN, TIBC, IRON, RETICCTPCT in the last 72 hours. Sepsis Labs: No results for input(s): PROCALCITON, LATICACIDVEN in the last 168 hours.   Recent Results (from the past 240 hour(s))  Resp Panel by RT-PCR (Flu A&B, Covid) Nasopharyngeal Swab     Status: None   Collection Time: 08/05/20  6:30 PM   Specimen: Nasopharyngeal Swab; Nasopharyngeal(NP) swabs in vial transport medium  Result Value Ref Range Status   SARS Coronavirus 2 by RT PCR NEGATIVE NEGATIVE Final    Comment: (NOTE) SARS-CoV-2 target nucleic acids are NOT DETECTED.  The SARS-CoV-2 RNA is generally detectable in upper respiratory specimens during the acute phase of infection. The lowest concentration of SARS-CoV-2 viral copies this assay can detect is 138 copies/mL. A negative result does not preclude SARS-Cov-2 infection and should not be used as the sole basis for treatment or other patient management  decisions. A negative result may occur with  improper specimen collection/handling, submission of specimen other than nasopharyngeal swab, presence of viral mutation(s) within the areas targeted by this assay, and inadequate number of viral copies(<138 copies/mL). A negative result must be combined with clinical observations, patient history, and epidemiological information. The expected result is Negative.  Fact Sheet for Patients:  EntrepreneurPulse.com.au  Fact Sheet for Healthcare Providers:  IncredibleEmployment.be  This test is no t yet approved or cleared by the [place] FDA and  has been authorized for detection and/or diagnosis of SARS-CoV-2 by FDA under an Emergency Use Authorization (EUA). This EUA will remain  in effect (meaning this test can be used) for the duration of the COVID-19 declaration under Section 564(b)(1) of the Act, 21 U.S.C.section 360bbb-3(b)(1), unless the authorization is terminated  or revoked sooner.       Influenza A  by PCR NEGATIVE NEGATIVE Final   Influenza B by PCR NEGATIVE NEGATIVE Final    Comment: (NOTE) The Xpert Xpress SARS-CoV-2/FLU/RSV plus assay is intended as an aid in the diagnosis of influenza from Nasopharyngeal swab specimens and should not be used as a sole basis for treatment. Nasal washings and aspirates are unacceptable for Xpert Xpress SARS-CoV-2/FLU/RSV testing.  Fact Sheet for Patients: EntrepreneurPulse.com.au  Fact Sheet for Healthcare Providers: IncredibleEmployment.be  This test is not yet approved or cleared by the [place] FDA and has been authorized for detection and/or diagnosis of SARS-CoV-2 by FDA under an Emergency Use Authorization (EUA). This EUA will remain in effect (meaning this test can be used) for the duration of the COVID-19 declaration under Section 564(b)(1) of the Act, 21 U.S.C. section 360bbb-3(b)(1), unless the authorization is terminated or revoked.  Performed at [facility], [place] [address], [place], [place] [address]   Culture, blood (routine x 2)     Status: None   Collection Time: 08/05/20  8:18 PM   Specimen: BLOOD  Result Value Ref Range Status   Specimen Description   Final    BLOOD RIGHT ARM Performed at [place] [address] [place], [place] [address]    Special Requests   Final    BOTTLES DRAWN AEROBIC AND ANAEROBIC Blood Culture results may not be optimal due to an excessive volume of blood received in culture bottles Performed at [place] [address] [place], [place] [address]    Culture   Final    NO GROWTH 5 DAYS Performed at [facility], [place] [address], [place], [place] [address]    Report Status 08/11/2020 FINAL  Final  Culture, blood (routine x 2)     Status: None   Collection Time: 08/05/20  9:00 PM   Specimen: BLOOD  Result Value Ref Range Status   Specimen Description   Final    BLOOD BLOOD  RIGHT HAND Performed at [place] [address] [place], [place] [address]    Special Requests   Final    BOTTLES DRAWN AEROBIC AND ANAEROBIC Blood Culture adequate volume Performed at [place] [address] [place], [place] [address]    Culture   Final    NO GROWTH 5 DAYS Performed at [facility], [place] [address], [place], [place] [address]    Report Status 08/11/2020 FINAL  Final  Culture, body fluid w Gram Stain-bottle     Status: None   Collection Time: 08/07/20  3:25 PM   Specimen: Fluid  Result Value Ref Range Status   Specimen Description FLUID PERICARDIAL  Final   Special Requests BOTTLES DRAWN AEROBIC AND ANAEROBIC  Final   Culture   Final    NO GROWTH 5 DAYS Performed at [facility], [place] [address], [place], [place] [address]    Report Status 08/12/2020 FINAL  Final  Gram stain     Status: None   Collection Time: 08/07/20  3:25 PM   Specimen: Fluid  Result Value Ref Range Status   Specimen Description FLUID PERICARDIAL  Final   Special Requests NONE  Final   Gram Stain   Final    RARE WBC PRESENT,BOTH PMN AND MONONUCLEAR NO ORGANISMS SEEN Performed at [facility], [place] [address], [place], [place] [address]    Report Status 08/07/2020 FINAL  Final  MRSA Next Gen by PCR, Nasal     Status: None   Collection Time: 08/07/20  5:36 PM   Specimen: Nasal Mucosa; Nasal Swab  Result Value Ref Range Status   MRSA by PCR Next Gen NOT DETECTED NOT DETECTED Final    Comment: (NOTE) The GeneXpert MRSA Assay (FDA approved for NASAL specimens only), is one component of a comprehensive MRSA colonization surveillance program. It is not intended to diagnose MRSA infection nor to guide or monitor treatment for MRSA infections. Test performance is not FDA approved in patients less than 40 years old. Performed at [facility], [place] [address] [place], [place] [address]   Culture, Respiratory w Gram Stain      Status: None   Collection Time: 08/09/20 10:35 AM   Specimen: Tracheal Aspirate; Respiratory  Result Value Ref Range Status   Specimen Description TRACHEAL ASPIRATE  Final   Special Requests NONE  Final   Gram Stain   Final    FEW WBC PRESENT,BOTH PMN AND MONONUCLEAR NO ORGANISMS SEEN    Culture   Final    RARE Normal respiratory flora-no Staph aureus or Pseudomonas seen Performed at [facility], [address], [place], [place] [address]    Report Status 08/11/2020 FINAL  Final         Radiology Studies: No results found.      Scheduled Meds:  amLODipine  5 mg Oral Daily   Chlorhexidine Gluconate Cloth  6 each Topical Daily   colchicine  0.6 mg Oral Daily   indomethacin  50 mg Oral BID WC   insulin aspart  0-5 Units Subcutaneous QHS   insulin aspart  0-6 Units Subcutaneous TID WC   metoprolol tartrate  25 mg Oral BID   osimertinib mesylate  80 mg Oral Daily   Continuous Infusions:  sodium chloride Stopped (08/09/20 1528)     LOS: 9 days    Time spent: 36 minutes spent on chart review, discussion with nursing staff, consultants, updating family and interview/physical exam; more than 50% of that time was spent in counseling and/or coordination of care.    Kalub Morillo J [place] DO [facility] Available via Epic secure chat 7am-7pm After these hours, please refer to coverage provider listed on [email or web address] 08/14/2020, 10:11 AM

## 2020-08-14 NOTE — Progress Notes (Signed)
HEMATOLOGY-ONCOLOGY PROGRESS NOTE  SUBJECTIVE: [name] is resting in bed quietly this afternoon.  She denies pain today.  Breathing is about the same.  She is planning to go to SNF for rehab upon hospital discharge.    Oncology History Overview Note  Chronology  75 year old female with history of hypertension, diabetes presented to the ED with hypoxia shortness of breath found to have large left-sided pleural effusion as well as pericardial effusion. Patient tells me that she had SOB for about 5 months prior to presentation and she initially thought it was bronchitis. She has also noted some weight loss of about 15 lbs and attributed this to dietary changes. Cough noted, lot of phlegm , no hemoptysis. A day before presentation to the hospital, she noticed that she could not walk to the mailbox and hence went to the hospital. 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  Repeat chest x-ray 11/15 showed persistent consolidation/effusion of the left lower lobe, and repeat thoracentesis performed 11/17 with 850 mL fluid removal. Patient underwent CT chest showed improved aeration of the left lung with residual left infrahilar masslike density possibly reflecting the primary bronchogenic carcinoma, grossly stable left axillary confluent mediastinal and hilar adenopathy consistent with metastatic disease, left adrenal nodule suspicious for metastasis, new small dependent right pleural effusion stable pericardial effusion. CT abdomen pelvis showed no evidence of primary intra abdominal or intrapelvic malignancy. Partial visualization of heterogeneously enhancing left lower lung opacities as well as Left pleural effusion. 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 There is a 2.4 cm LEFT adrenal nodule. Findings are concerning for  metastatic disease.  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 MR brain negative for malignancy.  Foundation one EGFR exon 19 deletion. Started tagrisso Dec 2nd week 2021. Imaging from March 2022 consistent with response.   Adenocarcinoma of left lung, stage 4 ([place])  01/13/2020 Initial Diagnosis   Adenocarcinoma of left lung, stage 4 ([place])    01/13/2020 Cancer Staging   Staging form: Lung, AJCC 8th Edition - Clinical stage from 01/13/2020: Stage IVB (cTX, cN3, cM1c) - Signed by [name], MD on 01/13/2020    01/30/2020 -  Chemotherapy   The patient had [No matching medication found in this treatment plan]   for chemotherapy treatment.        REVIEW OF SYSTEMS:   Constitutional: Denies fevers, chills  Eyes: Denies blurriness of vision Ears, nose, mouth, throat, and face: Denies mucositis or sore throat Respiratory: Shortness of breath improved with oxygen Cardiovascular: Denies palpitation, chest discomfort Gastrointestinal:  Denies nausea, heartburn or change in bowel habits Skin: Denies abnormal skin rashes Lymphatics: Denies new lymphadenopathy or easy bruising Neurological:Denies numbness, tingling or new weaknesses Behavioral/Psych: Mood is stable, no new changes  Extremities: Reports some swelling in her feet All other systems were reviewed with the patient and are negative.  I have reviewed the past medical history, past surgical history, social history and family  history with the patient and they are unchanged from previous  note.   PHYSICAL EXAMINATION: ECOG PERFORMANCE STATUS: 2 - Symptomatic, <50% confined to bed  Vitals:   08/14/20 1057 08/14/20 1058  BP: (!) 160/60 (!) 160/60  Pulse: 92 92  Resp: 16   Temp: 98.2 F (36.8 C)   SpO2: 100%    Filed Weights   08/09/20 0335 08/13/20 1032 08/14/20 0516  Weight: 62.8 kg 60.8 kg 61.1 kg    Intake/Output from previous day: 06/27 0701 - 06/28 0700 In: 360 [P.O.:360] Out: -   GENERAL:alert, no distress and comfortable SKIN: skin color, texture, turgor are normal, no rashes or significant lesions EYES: normal, Conjunctiva are pink and non-injected, sclera clear LUNGS: Diminished at bases. HEART: regular rate & rhythm and no murmurs and trace pedal edema ABDOMEN:abdomen soft, non-tender and normal bowel sounds NEURO: alert & oriented x 3 with fluent speech, no focal motor/sensory deficits  LABORATORY DATA:  I have reviewed the data as listed CMP Latest Ref Rng & Units 08/12/2020 08/11/2020 08/10/2020  Glucose 70 - 99 mg/dL 194(H) 145(H) 160(H)  BUN 8 - 23 mg/dL 33(H) 46(H) 47(H)  Creatinine 0.44 - 1.00 mg/dL 0.86 1.10(H) 1.67(H)  Sodium 135 - 145 mmol/L 135 131(L) 135  Potassium 3.5 - 5.1 mmol/L 4.0 4.0 4.0  Chloride 98 - 111 mmol/L 104 98 103  CO2 22 - 32 mmol/L _0 Calcium 8.9 - 10.3 mg/dL 8.6(L) 8.7(L) 8.8(L)  Total Protein 6.5 - 8.1 g/dL - - -  Total Bilirubin 0.3 - 1.2 mg/dL - - -  Alkaline Phos 38 - 126 U/L - - -  AST 15 - 41 U/L - - -  ALT 0 - 44 U/L - - -    Lab Results  Component Value Date   WBC 5.6 08/12/2020   HGB 11.4 (L) 08/12/2020   HCT 34.8 (L) 08/12/2020   MCV 89.5 08/12/2020   PLT 237 08/12/2020   NEUTROABS 7.9 (H) 08/09/2020    DG Chest 1 View  Result Date: 08/11/2020 CLINICAL DATA:  Shortness of breath.  Thoracentesis EXAM: CHEST  1 VIEW COMPARISON:  Yesterday FINDINGS: Negative for pneumothorax. Continued extensive opacification of the left chest  from pulmonary opacity and small loculated pleural effusion by CT. Cardiopericardial enlargement with large pericardial effusion by CT. Improved aeration on the right, although persistent perihilar infiltrate. IMPRESSION: 1. No acute finding related to thoracentesis. 2. No detected change from yesterday other than improved lung volumes. Electronically Signed   By: Monte Fantasia M.D.   On: 08/11/2020 10:29   CT Chest Wo Contrast  Result Date: 08/05/2020 CLINICAL DATA:  Shortness of breath EXAM: CT CHEST WITHOUT CONTRAST TECHNIQUE: Multidetector CT imaging of the chest was performed following the standard protocol without IV contrast. COMPARISON:  04/19/2020 FINDINGS: Cardiovascular: Large pericardial effusion measuring approximately 2.1 cm in thickness. Calcific aortic atherosclerosis. Mediastinum/Nodes: Increased density within the upper mediastinum, likely lymphadenopathy. There is left greater than right axillary adenopathy. Asymmetric density within the left breast. There is also left breast skin thickening. Lungs/Pleura: Left-greater-than-right heterogeneous interstitial and airspace opacity. Peribronchial thickening. Left lower lobe bronchi are occluded. There is complete complex of the left lower lobe. Upper Abdomen: 16 mm left adrenal mass. Musculoskeletal: Sclerotic lesions of the left second,, right third and left seventh ribs. Numerous sclerotic lesions within the vertebral bodies, greatest at T3. IMPRESSION: 1. Left breast heterogeneous density with skin thickening is concerning for carcinoma the skin thickening could be due to edema. Correlation with mammography is recommended. 2. Large pericardial effusion, measuring up  to 2.1 cm in thickness, progressed. 3. Left-greater-than-right heterogeneous interstitial and airspace opacity, concerning for pulmonary edema or infection. Left lower lobe bronchi are occluded. Reportedly the patient has a history of non-small cell lung carcinoma, but no distinct  mass is visualized on this noncontrast study. Aeration of both lungs is much worse than on 04/19/2020. 4. Left greater than right axillary and mediastinal lymphadenopathy, likely metastatic. 5. Sclerotic lesions of the left second, right third and left seventh ribs, consistent with metastatic disease. 6. 16 mm left adrenal mass, concerning for metastatic disease. Aortic Atherosclerosis (ICD10-I70.0). Electronically Signed   By: Ulyses Jarred M.D.   On: 08/05/2020 20:53   CARDIAC CATHETERIZATION  Result Date: 08/07/2020 Monitor patient closely in CCU under CCM guidance/Management Continue pericardial drain for 2-3 days till effusion is less than 30 ml per day.  DG Chest Port 1 View  Result Date: 08/10/2020 CLINICAL DATA:  Shortness of breath.  Extubation. EXAM: PORTABLE CHEST 1 VIEW COMPARISON:  08/09/2020 FINDINGS: Endotracheal tube and nasogastric tube have been removed. Again noted is marked consolidation and opacification throughout the left hemithorax with minimal aeration near the left lung apex. Left lung aeration has slightly decreased. Markedly decreased aeration in the right lung with increased patchy interstitial densities in the right lung. Cardiac silhouette is obscured by the lung densities. Negative for pneumothorax. Again noted is a small bore catheter overlying the left side of the heart. IMPRESSION: 1. Decreased aeration in both lungs following removal of the endotracheal tube. Difficult to exclude worsening edema or interstitial lung densities. 2. Stable position of the small bore tube overlying the left side of the chest. Electronically Signed   By: Markus Daft M.D.   On: 08/10/2020 09:02   DG CHEST PORT 1 VIEW  Result Date: 08/09/2020 CLINICAL DATA:  Intubation.  Respiratory failure. EXAM: PORTABLE CHEST 1 VIEW COMPARISON:  08/07/2020.  CT 08/05/2020. FINDINGS: Endotracheal tube, NG tube in stable position. Cardiomegaly again noted. Bilateral pulmonary infiltrates/edema, improved from  prior exam. Large left pleural effusion. No pneumothorax. Degenerative changes scoliosis thoracic spine. IMPRESSION: 1.  Endotracheal tube and NG tube in stable position. 2. Cardiomegaly. Bilateral pulmonary infiltrates/edema, improved from prior exam. Large left pleural effusion. Electronically Signed   By: Marcello Moores  Register   On: 08/09/2020 07:19   DG CHEST PORT 1 VIEW  Result Date: 08/07/2020 CLINICAL DATA:  75 year old female status post intubation. EXAM: PORTABLE CHEST 1 VIEW COMPARISON:  Chest radiograph dated 08/05/2020 and CT dated 08/05/2020. FINDINGS: Endotracheal tube with tip approximately 17 mm above the carina. Significant interval progression of bilateral pulmonary opacities compared to the prior radiograph likely representing worsening edema/CHF or pneumonia. Probable small bilateral pleural effusions. No pneumothorax. The cardiac borders are silhouetted. Atherosclerotic calcification of the aorta. No acute osseous pathology. Osteopenia. IMPRESSION: 1. Endotracheal tube above the carina. 2. Significant interval progression of bilateral pulmonary opacities likely representing worsening edema/CHF or pneumonia. Electronically Signed   By: Anner Crete M.D.   On: 08/07/2020 19:31   DG Chest Port 1 View  Result Date: 08/05/2020 CLINICAL DATA:  Cough and shortness of breath. EXAM: PORTABLE CHEST 1 VIEW COMPARISON:  February 23, 2020 FINDINGS: Markedly enlarged and globular cardiac silhouette. Calcific atherosclerotic disease of the aorta. Probable left pleural effusion. Left lower lobe airspace disease versus atelectasis. Bilateral interstitial opacities with central predominance. Osseous structures are without acute abnormality. Soft tissues are grossly normal. IMPRESSION: 1. Markedly enlarged and globular cardiac silhouette. Cardiomyopathy versus enlarging pericardial effusion. 2. Probable left pleural effusion with  left lower lobe airspace disease versus atelectasis. 3. Bilateral interstitial  opacities with central predominance may represent interstitial pulmonary edema. Electronically Signed   By: Fidela Salisbury M.D.   On: 08/05/2020 17:46   DG Abd Portable 1V  Result Date: 08/08/2020 CLINICAL DATA:  OG tube placement EXAM: PORTABLE ABDOMEN - 1 VIEW COMPARISON:  CT 04/19/2020, chest x-ray 08/07/2020 FINDINGS: Esophageal tube tip and side-port presumably project over the gastric body. Mild diffuse air-filled bowel without definitive obstructive pattern. Catheter tubing projects over the left lower chest central abdomen and pelvis. Sclerotic lesion within the left anterior iliac spine and left pubic symphysis. IMPRESSION: 1. Esophageal tube tip and side port overlie the gastric body 2. Mild diffuse increased bowel gas without definitive obstructive pattern 3. Osseous sclerotic metastatic disease. Electronically Signed   By: Donavan Foil M.D.   On: 08/08/2020 16:13   ECHOCARDIOGRAM COMPLETE  Result Date: 08/06/2020    ECHOCARDIOGRAM REPORT   Patient Name:   Sara Ferguson Date of Exam: 08/06/2020 Medical Rec #:  [id number]      Height:       64.0 in Accession #:    [id number]     Weight:       142.9 lb Date of Birth:  1945-05-29      BSA:          1.696 m Patient Age:    39 years       BP:           164/72 mmHg Patient Gender: F              HR:           101 bpm. Exam Location:  Inpatient Procedure: 2D Echo, Cardiac Doppler and Color Doppler Indications:     I31.3 Pericardial effusion  History:         Patient has prior history of Echocardiogram examinations, most                  recent 01/26/2020. Risk Factors:Hypertension and Diabetes.                  Cancer.  Sonographer:     Jonelle Sidle Dance Referring Phys:  [id number] Elmarie Shiley Diagnosing Phys: Dixie Dials MD IMPRESSIONS  1. Left ventricular ejection fraction, by estimation, is 60 to 65%. The left ventricle has normal function. The left ventricle has no regional wall motion abnormalities. Left ventricular diastolic parameters are  consistent with Grade I diastolic dysfunction (impaired relaxation).  2. Right ventricular systolic function is normal. The right ventricular size is normal.  3. Moderate pericardial effusion. The pericardial effusion is circumferential. There is no evidence of cardiac tamponade.  4. The mitral valve is degenerative. Mild mitral valve regurgitation.  5. The aortic valve is tricuspid. There is mild calcification of the aortic valve. There is mild thickening of the aortic valve. Aortic valve regurgitation is not visualized. Mild aortic valve sclerosis is present, with no evidence of aortic valve stenosis.  6. The inferior vena cava is dilated in size with <50% respiratory variability, suggesting right atrial pressure of 15 mmHg. 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 FINDINGS  Left Ventricle: Left ventricular ejection fraction, by estimation, is 60 to 65%. The left ventricle has normal function. The left ventricle has no regional wall motion abnormalities. The left ventricular internal cavity size was normal in size. There is  borderline concentric left ventricular hypertrophy. Left ventricular diastolic parameters are consistent with Grade I diastolic dysfunction (  impaired relaxation). Right Ventricle: The right ventricular size is normal. No increase in right ventricular wall thickness. Right ventricular systolic function is normal. Left Atrium: Left atrial size was normal in size. Right Atrium: Right atrial size was normal in size. Pericardium: A moderately sized pericardial effusion is present. The pericardial effusion is circumferential. The pericardial effusion appears to contain mixed echogenic material. There is no evidence of cardiac tamponade. Mitral Valve: The mitral valve is degenerative in appearance. There is mild thickening of the mitral valve leaflet(s). There is mild calcification of the mitral valve leaflet(s). Mild mitral annular calcification.  Mild mitral valve regurgitation. Tricuspid Valve: The tricuspid valve is normal in structure. Tricuspid valve regurgitation is trivial. Aortic Valve: The aortic valve is tricuspid. There is mild calcification of the aortic valve. There is mild thickening of the aortic valve. Aortic valve regurgitation is not visualized. Mild aortic valve sclerosis is present, with no evidence of aortic valve stenosis. Pulmonic Valve: The pulmonic valve was normal in structure. Pulmonic valve regurgitation is not visualized. Aorta: The aortic root is normal in size and structure. There is minimal (Grade I) atheroma plaque involving the ascending aorta. Venous: The inferior vena cava is dilated in size with less than 50% respiratory variability, suggesting right atrial pressure of 15 mmHg. IAS/Shunts: The atrial septum is grossly normal.  LEFT VENTRICLE PLAX 2D LVIDd:         3.70 cm  Diastology LVIDs:         2.10 cm  LV e' medial:    4.46 cm/s LV PW:         1.20 cm  LV E/e' medial:  17.7 LV IVS:        1.10 cm  LV e' lateral:   4.68 cm/s LVOT diam:     1.90 cm  LV E/e' lateral: 16.9 LV SV:         52 LV SV Index:   31 LVOT Area:     2.84 cm  RIGHT VENTRICLE             IVC RV Basal diam:  2.50 cm     IVC diam: 2.20 cm RV S prime:     10.40 cm/s TAPSE (M-mode): 1.8 cm LEFT ATRIUM             Index       RIGHT ATRIUM          Index LA diam:        4.20 cm 2.48 cm/m  RA Area:     9.54 cm LA Vol (A2C):   66.9 ml 39.45 ml/m RA Volume:   19.60 ml 11.56 ml/m LA Vol (A4C):   25.8 ml 15.21 ml/m LA Biplane Vol: 42.6 ml 25.12 ml/m  AORTIC VALVE LVOT Vmax:   97.00 cm/s LVOT Vmean:  66.700 cm/s LVOT VTI:    0.185 m  AORTA Ao Root diam: 2.80 cm Ao Asc diam:  2.70 cm MITRAL VALVE MV Area (PHT): 5.13 cm     SHUNTS MV Decel Time: 148 msec     Systemic VTI:  0.18 m MV E velocity: 79.00 cm/s   Systemic Diam: 1.90 cm MV A velocity: 138.00 cm/s MV E/A ratio:  0.57 Dixie Dials MD Electronically signed by Dixie Dials MD Signature Date/Time:  08/06/2020/12:02:39 PM    Final    ECHOCARDIOGRAM LIMITED  Result Date: 08/10/2020    ECHOCARDIOGRAM LIMITED REPORT   Patient Name:   Sara Ferguson Date of Exam: 08/10/2020 Medical Rec #:  [id number]      Height:       64.0 in Accession #:    [id number]     Weight:       138.4 lb Date of Birth:  [birth year]      BSA:          1.673 m Patient Age:    31 years       BP:           129/56 mmHg Patient Gender: F              HR:           89 bpm. Exam Location:  Inpatient Procedure: 2D Echo, Color Doppler and Cardiac Doppler Indications:     Pericardial effusion  History:         Patient has prior history of Echocardiogram examinations, most                  recent 08/08/2020. Signs/Symptoms:Shortness of Breath. Cancer.                  Pericarditis.  Sonographer:     Clayton Lefort RDCS (AE) Referring Phys:  [place] Diagnosing Phys: Dixie Dials MD IMPRESSIONS  1. Left ventricular ejection fraction, by estimation, is 60 to 65%. The left ventricle has normal function. The left ventricle has no regional wall motion abnormalities. There is moderate left ventricular hypertrophy. Left ventricular diastolic function  could not be evaluated.  2. Right ventricular systolic function is normal. The right ventricular size is normal.  3. Left atrial size was moderately dilated.  4. Right atrial size was moderately dilated.  5. A small pericardial effusion is present. The pericardial effusion is localized near the right ventricle, localized near the right atrium and surrounding the apex. There is no evidence of cardiac tamponade.  6. The mitral valve is degenerative. Mild mitral valve regurgitation.  7. The aortic valve is tricuspid. There is mild calcification of the aortic valve. There is mild thickening of the aortic valve. Mild to moderate aortic valve sclerosis/calcification is present, without any evidence of aortic stenosis.  8. There is mild (Grade II) atheroma plaque involving the aortic root.  9. The inferior vena  cava is normal in size with <50% respiratory variability, suggesting right atrial pressure of 8 mmHg. FINDINGS  Left Ventricle: Left ventricular ejection fraction, by estimation, is 60 to 65%. The left ventricle has normal function. The left ventricle has no regional wall motion abnormalities. There is moderate left ventricular hypertrophy. Left ventricular diastolic function could not be evaluated. Right Ventricle: The right ventricular size is normal. No increase in right ventricular wall thickness. Right ventricular systolic function is normal. Left Atrium: Left atrial size was moderately dilated. Right Atrium: Right atrial size was moderately dilated. Pericardium: A small pericardial effusion is present. The pericardial effusion is localized near the right ventricle, localized near the right atrium and surrounding the apex. There is no evidence of cardiac tamponade. Mitral Valve: The mitral valve is degenerative in appearance. There is mild thickening of the mitral valve leaflet(s). There is mild calcification of the mitral valve leaflet(s). Mild mitral annular calcification. Mild mitral valve regurgitation. Tricuspid Valve: The tricuspid valve is normal in structure. Tricuspid valve regurgitation is mild. Aortic Valve: The aortic valve is tricuspid. There is mild calcification of the aortic valve. There is mild thickening of the aortic valve. There is mild aortic valve annular calcification. Mild to moderate aortic valve sclerosis/calcification is present, without any  evidence of aortic stenosis. Pulmonic Valve: The pulmonic valve was normal in structure. Aorta: The aortic root is normal in size and structure. There is mild (Grade II) atheroma plaque involving the aortic root. Venous: The inferior vena cava is normal in size with less than 50% respiratory variability, suggesting right atrial pressure of 8 mmHg. IAS/Shunts: The interatrial septum was not assessed. Dixie Dials MD Electronically signed by Dixie Dials MD Signature Date/Time: 08/10/2020/1:26:34 PM    Final    ECHOCARDIOGRAM LIMITED  Result Date: 08/08/2020    ECHOCARDIOGRAM LIMITED REPORT   Patient Name:   Sara Ferguson Date of Exam: 08/08/2020 Medical Rec #:  [id number]      Height:       64.0 in Accession #:    [id number]     Weight:       133.3 lb Date of Birth:  1946-02-13      BSA:          1.647 m Patient Age:    51 years       BP:           116/47 mmHg Patient Gender: F              HR:           85 bpm. Exam Location:  Inpatient Procedure: Limited Echo Indications:     Pericardial effusion post pericardial centesis  History:         Patient has prior history of Echocardiogram examinations, most                  recent 08/06/2020. Signs/Symptoms:Shortness of Breath. Cancer.                  Pericarditis.  Sonographer:     [name] RDCS Referring Phys:  [place] Diagnosing Phys: Dixie Dials MD IMPRESSIONS  1. Left ventricular ejection fraction, by estimation, is 65 to 70%. The left ventricle has normal function. The left ventricle has no regional wall motion abnormalities. There is mild concentric left ventricular hypertrophy.  2. Right ventricular systolic function is normal.  3. Left atrial size was mildly dilated.  4. Right atrial size was mildly dilated.  5. Sucessful pericardiocentesis.  6. The mitral valve is degenerative.  7. The aortic valve is tricuspid. There is mild calcification of the aortic valve. There is mild thickening of the aortic valve.  8. There is mild (Grade II) atheroma plaque involving the ascending aorta. FINDINGS  Left Ventricle: Left ventricular ejection fraction, by estimation, is 65 to 70%. The left ventricle has normal function. The left ventricle has no regional wall motion abnormalities. There is mild concentric left ventricular hypertrophy. Right Ventricle: Right ventricular systolic function is normal. Left Atrium: Left atrial size was mildly dilated. Right Atrium: Right atrial size was mildly dilated.  Pericardium: Sucessful pericardiocentesis. There is no evidence of pericardial effusion. Mitral Valve: The mitral valve is degenerative in appearance. Tricuspid Valve: The tricuspid valve is normal in structure. Aortic Valve: The aortic valve is tricuspid. There is mild calcification of the aortic valve. There is mild thickening of the aortic valve. There is mild aortic valve annular calcification. Pulmonic Valve: The pulmonic valve was normal in structure. Aorta: The aortic root is normal in size and structure. There is mild (Grade II) atheroma plaque involving the ascending aorta. Venous: The inferior vena cava was not well visualized. Dixie Dials MD Electronically signed by Dixie Dials MD Signature Date/Time: 08/08/2020/3:22:53 PM    Final  ASSESSMENT AND PLAN: Adenocarcinoma of left lung, stage 4 (HCC) This is a very pleasant 75 year old female patient with past medical history significant for hypertension, diabetes mellitus type 2 referred to hematology and medical oncology for follow-up on osimertinib for her adenocarcinoma of the lung EGFR exon 19 deletion. She was started on first-line Tagrisso and tolerated it well. CT scan performed this admission showed concern for disease progression and Tagrisso has been discontinued.  Scan results were discussed with the patient previously and also with her niece on the telephone. Fluid from pericardial effusion was negative for malignancy but fluid from left pleural effusion consistent with adenocarcinoma. This has been sent to [facility] for repeat molecular studies.  No need for bronchoscopy from our standpoint at this time. If If she is not a candidate for additional targeted therapy, will consider systemic chemotherapy. Outpatient follow-up as already scheduled in our office on 7/5.  She was advised to keep this appointment.  Pericardial effusion History of malignant pericardial effusion Now with large pericardial effusion noted on CT  scan Status post pericardiocentesis on 4/32 which was complicated by PEA arrest with ROSC achieved in 3 to 4 minutes. Cytology consistent with reactive mesothelial cells. She is being followed by cardiology who recommends colchicine 0.6 mg daily and indomethacin 50 mg twice a day.  Pleural effusion Status post left thoracentesis 6/25. Cytology consistent with adenocarcinoma. Pleural fluid has been sent to [facility] for molecular studies. Will continue to monitor for recurrent pleural effusion and consider repeat thoracentesis or possible Pleurx in the future.   Essential hypertension Management per hospitalist   LOS: 9 days   Mikey Bussing, DNP, AGPCNP-BC, AOCNP 08/14/20   Attending Note  I personally saw the patient, reviewed the chart and examined the patient. The plan of care was discussed with the patient and the admitting team. I agree with the assessment and plan as documented above. Thank you very much for the consultation. There appears to be concern for progression with recurrence of pericardial effusion and concern for lymphangitic spread according to radiology. Unfortunately this is a bit uncommon with being on tagrisso for short duration. We will try to repeat molecular testing on blood or pericardial fluid. If no other targeted therapy and overt concern for progression, will have to consider chemotherapy. Would consider repeating MRI brain and CT abdomen pelvis to evaluate consider disease status. I discussed this with patient briefly, but appears a bit sleepy, so not sure if she understood. Discussed it briefly with her niece as well. Ok to hold tagrisso for now, will see her outpatient and discuss future treatment plans.

## 2020-08-14 NOTE — TOC Initial Note (Signed)
Transition of Care [facility]) - Initial/Assessment Note    Patient Details  Name: Chandrea Zellman MRN: [id number] Date of Birth: 10-07-1945  Transition of Care [facility]) CM/SW Contact:    Tresa Endo Phone Number: 08/14/2020, 3:07 PM  Clinical Narrative:                 5537: CSW spoke with pt's niece [name] who is agreeable to pt going to SNF at [place]. She also gave permission to fax pr out, CSW will follow up with [name] once offers are made. CSW will leave a copy of medicare.gov SNF resources in pt room tomorrow for [name] to look over after work.        Patient Goals and CMS Choice        Expected Discharge Plan and Services                                                Prior Living Arrangements/Services                       Activities of Daily Living Home Assistive Devices/Equipment: Eyeglasses ADL Screening (condition at time of admission) Patient's cognitive ability adequate to safely complete daily activities?: Yes Is the patient deaf or have difficulty hearing?: No Does the patient have difficulty seeing, even when wearing glasses/contacts?: No Does the patient have difficulty concentrating, remembering, or making decisions?: No Patient able to express need for assistance with ADLs?: Yes Does the patient have difficulty dressing or bathing?: No Independently performs ADLs?: Yes (appropriate for developmental age) Does the patient have difficulty walking or climbing stairs?: Yes (secondary to shortness of breath and bilateral feet swelling) Weakness of Legs: Both Weakness of Arms/Hands: None  Permission Sought/Granted                  Emotional Assessment              Admission diagnosis:  Hypoxemia [R09.02] Pericardial effusion [I31.3] Pericarditis [I31.9] Patient Active Problem List   Diagnosis Date Noted   Pericarditis 08/05/2020   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[place]) 08/05/2020   Hypokalemia  01/25/2020   Volume overload 01/25/2020   Adenocarcinoma of left lung, stage 4 ([place]) 01/13/2020   Goals of care, counseling/discussion 01/13/2020   Pleural effusion, left 12/31/2019   Type 2 diabetes mellitus without complication ([place]) 48/27/0786   Acute respiratory failure with hypoxia ([place]) 12/31/2019   Pericardial effusion 12/31/2019   Essential hypertension 12/30/2019   Pleural effusion, malignant 12/30/2019   Pleural effusion 12/30/2019   PCP:  [name], MD Pharmacy:   [id number] [place], [place] - [address] [place] [address] Phone: [phone] Fax: [phone]  [id number] [place] [place] [address] [place] [address] Phone: [phone] Fax: [phone]     Social Determinants of Health (SDOH) Interventions    Readmission Risk Interventions No flowsheet data found.

## 2020-08-15 ENCOUNTER — Inpatient Hospital Stay (HOSPITAL_COMMUNITY): Payer: Medicare Other

## 2020-08-15 LAB — BASIC METABOLIC PANEL
Anion gap: 9 (ref 5–15)
BUN: 16 mg/dL (ref 8–23)
CO2: 26 mmol/L (ref 22–32)
Calcium: 8.9 mg/dL (ref 8.9–10.3)
Chloride: 98 mmol/L (ref 98–111)
Creatinine, Ser: 0.75 mg/dL (ref 0.44–1.00)
GFR, Estimated: >60 mL/min (ref >60)
Glucose, Bld: 149 mg/dL — ABNORMAL HIGH (ref 70–99)
Potassium: 4 mmol/L (ref 3.5–5.1)
Sodium: 133 mmol/L — ABNORMAL LOW (ref 135–145)

## 2020-08-15 LAB — GLUCOSE, CAPILLARY
Glucose-Capillary: 125 mg/dL — ABNORMAL HIGH (ref 70–99)
Glucose-Capillary: 164 mg/dL — ABNORMAL HIGH (ref 70–99)
Glucose-Capillary: 161 mg/dL — ABNORMAL HIGH (ref 70–99)
Glucose-Capillary: 176 mg/dL — ABNORMAL HIGH (ref 70–99)

## 2020-08-15 LAB — CBC
HCT: 32.6 % — ABNORMAL LOW (ref 36.0–46.0)
Hemoglobin: 10.7 g/dL — ABNORMAL LOW (ref 12.0–15.0)
MCH: 28.8 pg (ref 26.0–34.0)
MCHC: 32.8 g/dL (ref 30.0–36.0)
MCV: 87.9 fL (ref 80.0–100.0)
Platelets: 268 10*3/uL (ref 150–400)
RBC: 3.71 MIL/uL — ABNORMAL LOW (ref 3.87–5.11)
RDW: 12.7 % (ref 11.5–15.5)
WBC: 5.2 10*3/uL (ref 4.0–10.5)
nRBC: 0 % (ref 0.0–0.2)

## 2020-08-15 LAB — MAGNESIUM: Magnesium: 1.5 mg/dL — ABNORMAL LOW (ref 1.7–2.4)

## 2020-08-15 LAB — ECHOCARDIOGRAM LIMITED
Height: 64 in
S' Lateral: 2.2 cm
Weight: 2158.74 oz

## 2020-08-15 MED ORDER — DRONABINOL 2.5 MG PO CAPS
2.5000 mg | ORAL_CAPSULE | Freq: Two times a day (BID) | ORAL | Status: DC
  Administered 2020-08-15 – 2020-08-17 (×4): 2.5 mg via ORAL
  Filled 2020-08-15 (×4): qty 1

## 2020-08-15 MED ORDER — MAGNESIUM SULFATE 2 GM/50ML IV SOLN
2.0000 g | Freq: Once | INTRAVENOUS | Status: AC
  Administered 2020-08-15: 2 g via INTRAVENOUS
  Filled 2020-08-15: qty 50

## 2020-08-15 NOTE — Progress Notes (Signed)
Inpatient Rehabilitation Admissions Coordinator   Noted therapy continue to recommend SNF. Patient quick to fatigue. We will sign off at this time.  Danne Baxter, RN, MSN Rehab Admissions Coordinator [phone] 08/15/2020 8:30 AM

## 2020-08-15 NOTE — Progress Notes (Addendum)
Palliative Medicine Inpatient Follow Up Note  Reason for consult:  "Stage IV lung cancer on treatment but frail"   HPI:  Per intake H&P --> 75 year old woman with PMHx significant for HTN, T2DM and stage IV NSCLC (c/b recurrent malignant pleural effusion, on oral chemotherapy) who presented to [facility] 6/19 for SOB, increased LE edema and chest discomfort. CT Chest completed demonstrating large pericardial effusion. Transferred to [facility] for pericardiocentesis.   Patient underwent pericardiocentesis 6/21; during the procedure, patient experienced vagal inhibition on advancement of the wire and drainage tube and went into PEA arrest requiring 3-4 minutes CPR, Epi x 1 and atropine x 1 with ROSC. She remained intubated post-procedure in the setting of arrest and was transferred to the CVICU.   Palliative care was consulted to start the conversation of goals of care for Sara Ferguson  Today's Discussion (08/15/2020):  *Please note that this is a verbal dictation therefore any spelling or grammatical errors are due to the "[employer]" system interpretation.  Chart reviewed.   I met with Sara Ferguson at bedside this morning, she was sitting up. She was in fair spirits. She continue to endorse that she has a poor appetite. I asked if she would be open to something to help stimulate this which she shares with me she would be.  Otherwise she complains of generalized weakness.  We reviewed the reasons why this may be occurring the main reason is the progression of her cancer.  Sara Ferguson does share that she is hopeful to gain strength at skilled nursing.  She shares the plan for a meeting with oncology on July 5 to further discuss additional treatment plans.  I was able to call patient's [facility] POA, Sara Ferguson to share with her the results of the cytology consistent with adenocarcinoma which is concerning for lymphangitic spread and the plan for additional molecular testing to be complete.  Discussed the plan again  for them to meet with oncology July 5.  Reviewed the date time and location of this appointment.  I shared depending upon what oncology says in terms of treatment it is important to continue along with discussions of goals of care.  I asked if Sara Ferguson would like an outpatient palliative care provider to follow along which she is in agreement with.  Questions and concerns addressed   Objective Assessment: Vital Signs Vitals:   08/15/20 0344 08/15/20 0530  BP: (!) 143/68 (!) 135/55  Pulse:  87  Resp:  18  Temp:  98.3 F (36.8 C)  SpO2:  100%    Intake/Output Summary (Last 24 hours) at 08/15/2020 1041 Last data filed at 08/14/2020 2200 Gross per 24 hour  Intake 240 ml  Output 75 ml  Net 165 ml    Last Weight  Most recent update: 08/15/2020  5:34 AM    Weight  61.2 kg (134 lb 14.7 oz)            Gen:  Elderly Frail AA F in NAD HEENT: moist mucous membranes CV: Regular rate and rhythm PULM: On 3LPM [place] ABD: soft/nontender EXT: No edema Neuro: Alert and oriented x3  SUMMARY OF RECOMMENDATIONS   Full code/Full scope of care   Cytology consistent with adenocarcinoma-plan for oncology follow-up July 5   Spiritual support further help with advanced directives  Weakness: PT/OT  FTT: Marinol 2.$RemoveBefore'5mg'[id number]$  PO BID AC  Goals at this time are to include generalized weakness at skilled nursing  Appreciate transitions of care support to set up outpatient palliative follow-up  Ongoing incremental palliative care support  Time Spent: 35 Greater than 50% of the time was spent in counseling and coordination of care ______________________________________________________________________________________ [place] Team Team Cell Phone: [phone] Please utilize secure chat with additional questions, if there is no response within 30 minutes please call the above phone number  Palliative Medicine Team providers are available by phone from 7am to  7pm daily and can be reached through the team cell phone.  Should this patient require assistance outside of these hours, please call the patient's attending physician.

## 2020-08-15 NOTE — Progress Notes (Signed)
Occupational Therapy Treatment Patient Details Name: Tayelor Osborne MRN: [id number] DOB: Sep 13, 1945 Today's Date: 08/15/2020    History of present illness Pt is a 75 y/o female presenting on 6/19 to ER with 1 week of progressive shortness of breath, LE edema, and chest discomfort.  Pt currently on oral chemotherapy. CT chest shows left breast heterogeneous density with skin thickening concerning for carcinoma, left pericardial effusion  and left greater than right heterogeneous interstitial and airspace opacity possibly pulmonary edema.  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  Also left adrenal mass concerning for metastatic disease. Pt admitted for pericardial effusion. Pt underwent pericardiocentesis for pericardial effusion with PEA arrest (3-4 min CPR). Pt extubated 6/23. Pending cytology from thoracentesis on 6/25 and molecular studies from pericardial fluid. PMH: non-small cell lung cancer stage IV, recurrent malignant pleural effusion, diabetes, essential hypertension, osteoarthritis.   OT comments  Pt received seated EOB agreeable to self-care tasks.  Pt demonstrating improved activity tolerance and mobility this session, with min assist - CGA for sit > stand and ambulatory transfers in room with RW.  Pt completed oral care in standing with somewhat hunched posture but able to alternate UE support to complete oral care while standing.  Pt completed clothing management with toileting with increased time but no physical assistance, only CGA for standing balance.  Therapist educated on AE for LB bathing and dressing while focusing on increased independence and energy conservation to complete self-care tasks.  Pt will continue to benefit from OT services to progress increase independence with self-care tasks and functional mobility.  Follow Up Recommendations  SNF;Supervision/Assistance - 24 hour    Equipment Recommendations  3 in 1 bedside commode     Recommendations for Other Services      Precautions / Restrictions Precautions Precautions: Fall;Other (comment) Precaution Comments: monitor O2, drain Restrictions Weight Bearing Restrictions: No       Mobility Bed Mobility               General bed mobility comments: received seated EOB    Transfers Overall transfer level: Needs assistance Equipment used: Rolling walker (2 wheeled) Transfers: Sit to/from Stand Sit to Stand: Min assist         General transfer comment: Pt attempting to stand by pulling on walker, cues to push up from EOB and BSC handles.  Pt demonstrating increased ease with sit > stand and standing tolerance    Balance Overall balance assessment: Needs assistance         Standing balance support: Single extremity supported;During functional activity Standing balance-Leahy Scale: Fair Standing balance comment: during oral care, pt able to alternate UE support and maintain standing balance with Min A - CGA                           ADL either performed or assessed with clinical judgement   ADL Overall ADL's : Needs assistance/impaired     Grooming: Standing;Min guard;Oral care;Wash/dry hands Grooming Details (indicate cue type and reason): ambulated to sink and brushed teeth in standing                 Toilet Transfer: Min guard;Ambulation;BSC Toilet Transfer Details (indicate cue type and reason): ambulated from EOB to sink for grooming tasks CGA with RW, pt then reports need to toilet therefore sat on BSC due to O2 tubing not able to access bathroom. Toileting- Water quality scientist and Hygiene: Min guard;Sit to/from stand  Toileting - Clothing Manipulation Details (indicate cue type and reason): Pt able to complete clothing management in standing with CGA for standing balance       General ADL Comments: Pt demonstrating increased activity tolerance and mobility this session.  Pt ambulated short distances in room with  RW and CGA while maintaining O2 sats >90% on 3L.  Pt tolerated standing for oral care.      Cognition Arousal/Alertness: Awake/alert Behavior During Therapy: Flat affect Overall Cognitive Status: No family/caregiver present to determine baseline cognitive functioning                         Following Commands: Follows one step commands with increased time       General Comments: Pt demonstrating mild impairments with processing, however demonstrating increased recall of location of items and awareness during mobility and grooming tasks in standing                   Pertinent Vitals/ Pain       Pain Assessment: No/denies pain         Frequency  Min 2X/week        Progress Toward Goals  OT Goals(current goals can now be found in the care plan section)  Progress towards OT goals: Progressing toward goals  Acute Rehab OT Goals Patient Stated Goal: get stronger at SNF then go home OT Goal Formulation: With patient Time For Goal Achievement: 08/24/20 Potential to Achieve Goals: Good  Plan Discharge plan remains appropriate       AM-PAC OT "6 Clicks" Daily Activity     Outcome Measure   Help from another person eating meals?: A Little Help from another person taking care of personal grooming?: A Little Help from another person toileting, which includes using toliet, bedpan, or urinal?: A Little Help from another person bathing (including washing, rinsing, drying)?: A Little Help from another person to put on and taking off regular upper body clothing?: A Little Help from another person to put on and taking off regular lower body clothing?: A Lot 6 Click Score: 17    End of Session Equipment Utilized During Treatment: Gait belt;Rolling walker;Oxygen  OT Visit Diagnosis: Unsteadiness on feet (R26.81);Other abnormalities of gait and mobility (R26.89);Muscle weakness (generalized) (M62.81)   Activity Tolerance Patient tolerated treatment well   Patient  Left in chair;with call bell/phone within reach;with chair alarm set   Nurse Communication Mobility status        Time: 3474-2595 OT Time Calculation (min): 28 min  Charges: OT General Charges $OT Visit: 1 Visit OT Treatments $Self Care/Home Management : 23-37 mins     [place], [place]  08/15/2020, 11:13 AM

## 2020-08-15 NOTE — Progress Notes (Signed)
PROGRESS NOTE    Sara Ferguson  WLN:[id number] DOB: 03-28-1945 DOA: 08/05/2020 PCP: Sara Pike, MD    Brief Narrative:  Sara Ferguson was admitted with the working diagnosis of pericardial effusion complicated with pulseless electrical activity cardiac arrest.   75 year old female with past medical history for non-small cell lung cancer stage IV, recurrent malignant pleural effusion, type 2 diabetes mellitus, hypertension and osteoarthritis who presented with 1 week of dyspnea, lower extremity edema and chest pain.  On her initial physical examination her oximetry was 89% on room air, temperature 98, blood pressure 199/74, heart rate 128, respiratory rate 29, he had dry mucous membranes, decrease breath sounds bilaterally with positive rales but no wheezing, heart S1-S2, present, tachycardic, distant heart sounds, abdomen soft, positive lower extremity edema.  CT chest with left breast heterogeneous density with skin thickening.  Large pericardial effusion 2.1 cm in thickness.  Left greater than right heterogeneous interstitial airspace disease, left lower lobe bronchi occluded.  Left greater than right axillary mediastinal adenopathy.  Patient underwent left-sided thoracentesis, 50 cc of cloudy fluid was obtained. 6/21, underwent pericardiocentesis which was complicated by pulseless electrical activity cardiac arrest, recovered spontaneous circulation after 3-4 minutes. Likely malignant effusion related to lung cancer.  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  Patient now sp thoracentesis and  pericardiocentesis. Her oxymetry is 100% on 3 L/min per Sara Ferguson with improvement in her symptoms. No chest pain and no cough.   Patient will follow up with Oncology as outpatient.  Continue holding on tagrisso for now.  Patient continue to be very weak and deconditioned, will need SNF to continue therapy.   2. SP cardiac arrest following paracentesis. Follow up with echocardiogram today.   3. T2DM  continue glucose cover and monitoring with insulin sliding scale.  4. HTN. Blood pressure now has been stable, amlodipine has been resumed along with metoprolol..  Continue to hold on hydralazine and furosemide for now.   5. Hyponatremia and Hypomagnesemia. Mg is down to 1,5, K is 4,0 and Na 133 with serum bicarbonate at 26.  Continue Mg correction with Mag sulfate, encourage po intake. Consult nutrition for assessment.   Status is: Inpatient  Remains inpatient appropriate because:Inpatient level of care appropriate due to severity of illness  Dispo:  Patient From: Home  Planned Disposition: Sara Ferguson  Medically stable for discharge: Yes     DVT prophylaxis: Enoxaparin   Code Status:   full  Family Communication:  No family at the bedside     Consultants:  Cardiology  Pulmonary  Palliative Care   Procedures:  Thoracentesis Pericardiocentesis    Subjective: Patient continue to feel very weak and deconditioned, not yet back to her baseline, no nausea or vomiting, no chest pain. Dyspnea improved with supplemental 02 per Sara Ferguson   Objective: Vitals:   08/15/20 0200 08/15/20 0344 08/15/20 0530 08/15/20 1138  BP: (!) 169/78 (!) 143/68 (!) 135/55 (!) 142/61  Pulse:   87 83  Resp:   18   Temp:   98.3 F (36.8 C) 98 F (36.7 C)  TempSrc:   Oral Oral  SpO2:   100%  Weight:   61.2 kg   Height:        Intake/Output Summary (Last 24 hours) at 08/15/2020 1341 Last data filed at 08/15/2020 1339 Gross per 24 hour  Intake 360 ml  Output 75 ml  Net 285 ml   Filed Weights    08/13/20 1032 08/14/20 0516 08/15/20 0530  Weight: 60.8 kg 61.1 kg 61.2 kg    Examination:   General: Not in pain or dyspnea, deconditioned  Neurology: Awake and alert, non focal  E ENT: positive pallor, no icterus, oral mucosa moist Cardiovascular: No JVD. S1-S2 present, rhythmic, no gallops, rubs, or murmurs. No lower extremity edema. Pulmonary: positive breath sounds bilaterally, adequate air movement, no wheezing, rhonchi or rales. Gastrointestinal. Abdomen soft and non tender Skin. No rashes Musculoskeletal: no joint deformities     Data Reviewed: I have personally reviewed following labs and imaging studies  CBC: Recent Labs  Lab 08/09/20 0647 08/10/20 0045 08/12/20 0030 08/15/20 0402  WBC 9.8 10.5 5.6 5.2  NEUTROABS 7.9*  --   --   --   HGB 12.0 11.6* 11.4* 10.7*  HCT 36.9 36.0 34.8* 32.6*  MCV 90.0 89.8 89.5 87.9  PLT 201 190 237 308   Basic Metabolic Panel: Recent Labs  Lab 08/09/20 0647 08/10/20 0045 08/11/20 0232 08/12/20 0030 08/15/20 0402  NA 139 135 131* 135 133*  K 3.9 4.0 4.0 4.0 4.0  CL 108 103 98 104 98  CO2 18* 22 24 25 26   GLUCOSE 96 160* 145* 194* 149*  BUN 33* 47* 46* 33* 16  CREATININE 1.84* 1.67* 1.10* 0.86 0.75  CALCIUM 8.7* 8.8* 8.7* 8.6* 8.9  MG  --  1.9  --   --  1.5*  PHOS  --  4.3  --   --   --    GFR: Estimated Creatinine Clearance: 50.9 mL/min (by C-G formula based on SCr of 0.75 mg/dL). Liver Function Tests: Recent Labs  Lab 08/09/20 0647  AST 45*  ALT 31  ALKPHOS 76  BILITOT 2.1*  PROT 6.5  ALBUMIN 2.4*   No results for input(s): LIPASE, AMYLASE in the last 168 hours. No results for input(s): AMMONIA in the last 168 hours. Coagulation Profile: No results for input(s): INR, PROTIME in the last 168 hours. Cardiac Enzymes: No results for input(s): CKTOTAL, CKMB, CKMBINDEX, TROPONINI in the last 168 hours. BNP (last 3 results) No results for input(s): PROBNP in the last 8760 hours. HbA1C: No results for  input(s): HGBA1C in the last 72 hours. CBG: Recent Labs  Lab 08/14/20 1055 08/14/20 1608 08/14/20 2125 08/15/20 0526 08/15/20 1115  GLUCAP 180* 157* 159* 125* 164*   Lipid Profile: No results for input(s): CHOL, HDL, LDLCALC, TRIG, CHOLHDL, LDLDIRECT in the last 72 hours. Thyroid Function Tests: No results for input(s): TSH, T4TOTAL, FREET4, T3FREE, THYROIDAB in the last 72 hours. Anemia Panel: No results for input(s): VITAMINB12, FOLATE, FERRITIN, TIBC, IRON, RETICCTPCT in the last 72 hours.    Radiology Studies: I have reviewed all of the imaging during this hospital visit personally     Scheduled Meds:  amLODipine  5 mg Oral Daily   colchicine  0.3 mg Oral Daily   dronabinol  2.5 mg Oral BID AC   indomethacin  50 mg Oral BID WC   insulin aspart  0-5 Units Subcutaneous QHS   insulin aspart  0-6 Units Subcutaneous TID WC   metoprolol tartrate  25 mg Oral BID   osimertinib mesylate  80 mg Oral Daily  Continuous Infusions:  sodium chloride Stopped (08/09/20 1528)     LOS: 10 days        Eulene Pekar Gerome Apley, MD

## 2020-08-15 NOTE — TOC Progression Note (Addendum)
Transition of Care [facility]) - Progression Note    Patient Details  Name: Sara Ferguson MRN: [id number] Date of Birth: 23-Oct-1945  [facility]) CM/SW Contact  Reece Agar, [place] Phone Number: 08/15/2020, 11:45 AM  Clinical Narrative:    5183: CSW spoke with pt about SNF pt understood and confirmed that [name] is to be the person of contact and to make all decisions.   1425: Per request CSW sent text for pt niece of SNF offers and will follow up with placement after getting confirmation of SNF location.         Expected Discharge Plan and Services                                                 Social Determinants of Health (SDOH) Interventions    Readmission Risk Interventions No flowsheet data found.

## 2020-08-15 NOTE — Progress Notes (Signed)
  Echocardiogram 2D Echocardiogram has been performed.  Sara Ferguson 08/15/2020, 3:33 PM

## 2020-08-16 LAB — GLUCOSE, CAPILLARY
Glucose-Capillary: 113 mg/dL — ABNORMAL HIGH (ref 70–99)
Glucose-Capillary: 175 mg/dL — ABNORMAL HIGH (ref 70–99)
Glucose-Capillary: 146 mg/dL — ABNORMAL HIGH (ref 70–99)
Glucose-Capillary: 186 mg/dL — ABNORMAL HIGH (ref 70–99)

## 2020-08-16 LAB — RESP PANEL BY RT-PCR (FLU A&B, COVID) ARPGX2
Influenza A by PCR: NEGATIVE
Influenza B by PCR: NEGATIVE
SARS Coronavirus 2 by RT PCR: NEGATIVE

## 2020-08-16 NOTE — Plan of Care (Signed)

## 2020-08-16 NOTE — Consult Note (Signed)
Ref: [name], MD   Subjective:  Awaiting SNF placement. VS stable. Monitor sinus rhythm.  Objective:  Vital Signs in the last 24 hours: Temp:  [97.8 F (36.6 C)-98.3 F (36.8 C)] 98.2 F (36.8 C) (06/30 1947) Pulse Rate:  [85-97] 87 (06/30 1947) Cardiac Rhythm: Normal sinus rhythm (06/30 0800) Resp:  [16-18] 16 (06/30 1947) BP: (130-187)/(57-93) 154/68 (06/30 1947) SpO2:  [98 %-100 %] 98 % (06/30 1947) Weight:  [61.3 kg] 61.3 kg (06/30 0340)  Physical Exam: BP Readings from Last 1 Encounters:  08/16/20 (!) 154/68     Wt Readings from Last 1 Encounters:  08/16/20 61.3 kg    Weight change: 0.081 kg Body mass index is 23.19 kg/m. HEENT: [place]/AT, Eyes-Brown, Conjunctiva-Pink, Sclera-Non-icteric Neck: No JVD, No bruit, Trachea midline. Lungs:  Clear, Bilateral. Cardiac:  Regular rhythm, normal S1 and S2, no S3. II/VI systolic murmur. Abdomen:  Soft, non-tender. BS present. Extremities:  No edema present. No cyanosis. No clubbing. CNS: AxOx3, Cranial nerves grossly intact, moves all 4 extremities.  Skin: Warm and dry.   Intake/Output from previous day: 06/29 0701 - 06/30 0700 In: 360 [P.O.:360] Out: -     Lab Results: BMET    Component Value Date/Time   NA 133 (L) 08/15/2020 0402   NA 135 08/12/2020 0030   NA 131 (L) 08/11/2020 0232   K 4.0 08/15/2020 0402   K 4.0 08/12/2020 0030   K 4.0 08/11/2020 0232   CL 98 08/15/2020 0402   CL 104 08/12/2020 0030   CL 98 08/11/2020 0232   CO2 26 08/15/2020 0402   CO2 25 08/12/2020 0030   CO2 24 08/11/2020 0232   GLUCOSE 149 (H) 08/15/2020 0402   GLUCOSE 194 (H) 08/12/2020 0030   GLUCOSE 145 (H) 08/11/2020 0232   BUN 16 08/15/2020 0402   BUN 33 (H) 08/12/2020 0030   BUN 46 (H) 08/11/2020 0232   CREATININE 0.75 08/15/2020 0402   CREATININE 0.86 08/12/2020 0030   CREATININE 1.10 (H) 08/11/2020 0232   CREATININE 0.79 04/26/2020 1513   CREATININE 0.81 03/29/2020 1621   CREATININE 0.77 03/01/2020 1623   CALCIUM  8.9 08/15/2020 0402   CALCIUM 8.6 (L) 08/12/2020 0030   CALCIUM 8.7 (L) 08/11/2020 0232   GFRNONAA >60 08/15/2020 0402   GFRNONAA >60 08/12/2020 0030   GFRNONAA 52 (L) 08/11/2020 0232   GFRNONAA >60 04/26/2020 1513   GFRNONAA >60 03/29/2020 1621   GFRNONAA >60 03/01/2020 1623   CBC    Component Value Date/Time   WBC 5.2 08/15/2020 0402   RBC 3.71 (L) 08/15/2020 0402   HGB 10.7 (L) 08/15/2020 0402   HCT 32.6 (L) 08/15/2020 0402   PLT 268 08/15/2020 0402   MCV 87.9 08/15/2020 0402   MCH 28.8 08/15/2020 0402   MCHC 32.8 08/15/2020 0402   RDW 12.7 08/15/2020 0402   LYMPHSABS 0.9 08/09/2020 0647   MONOABS 1.0 08/09/2020 0647   EOSABS 0.0 08/09/2020 0647   BASOSABS 0.0 08/09/2020 0647   HEPATIC Function Panel Recent Labs    05/24/20 1517 08/06/20 0330 08/09/20 0647  PROT 8.7* 7.4 6.5   HEMOGLOBIN A1C No components found for: HGA1C,  MPG CARDIAC ENZYMES No results found for: CKTOTAL, CKMB, CKMBINDEX, TROPONINI BNP No results for input(s): PROBNP in the last 8760 hours. TSH No results for input(s): TSH in the last 8760 hours. CHOLESTEROL No results for input(s): CHOL in the last 8760 hours.  Scheduled Meds:  amLODipine  5 mg Oral Daily   colchicine  0.3 mg Oral Daily   dronabinol  2.5 mg Oral BID AC   indomethacin  50 mg Oral BID WC   insulin aspart  0-5 Units Subcutaneous QHS   insulin aspart  0-6 Units Subcutaneous TID WC   metoprolol tartrate  25 mg Oral BID   Continuous Infusions:  sodium chloride Stopped (08/09/20 1528)   PRN Meds:.acetaminophen **OR** acetaminophen, albuterol, guaiFENesin, hydrALAZINE, hydrocortisone cream, ondansetron **OR** ondansetron (ZOFRAN) IV  Assessment/Plan: Adenocarcinoma of left lung with mets. Chronic pericardial effusion S/P pericardiocentesis Type 2 DM HTN  Plan:  F/U in 2 weeks . Continue medical treatment.   LOS: 11 days   Time spent including chart review, lab review, examination, discussion with patient : 25  min   Dixie Dials  MD  08/16/2020, 7:59 PM

## 2020-08-16 NOTE — Discharge Summary (Addendum)
Physician Discharge Summary  Sara Ferguson DDU:[id number] DOB: 1945-12-30 DOA: 08/05/2020  PCP: Sara Pike, MD  Admit date: 08/05/2020 Discharge date: 08/16/2020  Admitted From: Home  Disposition:  SNF   Recommendations for Outpatient Follow-up and new medication changes:  Follow up with primary care in 7 to 10 days.  Follow up with oncology as scheduled  Continue holding on furosemide and hydralazine.   I spoke over the phone with the patient's nice about patient's  condition, plan of care, prognosis and all questions were addressed.   Home Health: na   Equipment/Devices: na    Discharge Condition: stable  CODE STATUS: full  Diet recommendation:  heart healthy   Brief/Interim Summary: Sara Ferguson was admitted to the hospital with the working diagnosis of pericardial effusion complicated with pulseless electrical activity cardiac arrest.    75 year old female with past medical history for non-small cell lung cancer stage IV, recurrent malignant pleural effusion, type 2 diabetes mellitus, hypertension and osteoarthritis who presented with 1 week of dyspnea, lower extremity edema and chest pain.  On her initial physical examination her oximetry was 89% on room air, temperature 98, blood pressure 199/74, heart rate 128, respiratory rate 29, she had dry mucous membranes, decrease breath sounds bilaterally with positive rales but no wheezing, heart S1-S2, present, tachycardic, distant heart sounds, abdomen soft, positive lower extremity edema.  Sodium 138, potassium 3.9, chloride 103, bicarb 27, glucose 98, BUN 14, creatinine 0.68, white count 4.6, hemoglobin 13.3, hematocrit 40.7, platelets 231. SARS COVID-19 negative.  Chest radiograph with cardiomegaly, left lower lobe opacity.  Left pleural effusion.  EKG 120 bpm, normal axis, normal intervals, sinus rhythm, poor R wave progression, no significant ST segment or T wave changes.  CT chest with left breast heterogeneous density  with skin thickening.  Large pericardial effusion 2.1 cm in thickness.  Left greater than right heterogeneous interstitial airspace disease, left lower lobe bronchi occluded.  Left greater than right axillary mediastinal adenopathy.   Patient underwent left-sided thoracentesis, 50 cc of cloudy fluid was obtained. 6/21, underwent pericardiocentesis which was complicated by pulseless electrical activity cardiac arrest, recovered spontaneous circulation after 3-4 minutes. Likely malignant effusion related to lung cancer.    Acute hypoxemic respiratory failure due to left pleural effusion, left lower lobe atelectasis, and malignant pericardial effusion in the setting of stage IV non-small cell lung cancer. Patient had a prolonged hospital stay, underwent thoracentesis and pericardiocentesis.  Patient did develop cardiac arrest, PEA after thoracentesis, recovered spontaneous circulation after 3-4 minutes.  No signs of recurrence of pleural effusion, repeat echocardiography June/29 showed small pericardial effusion, no tamponade.  Plural fluid cytology with malignant cells consistent with adenocarcinoma.  Pericardium cytology with reactive mesothelial cells.   Oncology was consulted, recommendations to hold Tagrisso, follow-up as an outpatient, she may need repeat brain MRI, CT abdomen- pelvis as an outpatient.  2.  Type 2 diabetes mellitus.  She received insulin therapy for glucose control during her hospitalization.  3.  Hypertension.  Patient has remained hemodynamically stable after her initial cardiac arrest.  Eventually amlodipine and metoprolol will resume for blood pressure control. Clinically continue to hold hydralazine and furosemide.  4.  Hyponatremia/hypomagnesemia.  Her kidney function has remained stable, at discharge is 4. Mg is 1,5.   5. Gout. Continue with cholchicine, no acute flare.   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 [facility])    Discharge Instructions   Allergies as of 08/16/2020   No Known Allergies      Medication List     STOP taking these medications    furosemide 20 MG tablet Commonly known as: LASIX   hydrALAZINE 25 MG tablet Commonly known as: APRESOLINE   osimertinib mesylate 80 MG tablet Commonly known as: TAGRISSO   potassium chloride 10 MEQ tablet Commonly known as: KLOR-CON   tetrahydrozoline 0.05 % ophthalmic solution       TAKE these medications    albuterol 108 (90 Base) MCG/ACT inhaler Commonly known as: VENTOLIN HFA Inhale 2 puffs into the lungs every 6 (six) hours as needed for wheezing or shortness of breath.   amLODipine 5 MG tablet Commonly known as: NORVASC TAKE 1 TABLET (5 MG TOTAL) BY MOUTH DAILY.   colchicine 0.6 MG tablet Take 1 tablet (0.6 mg total) by mouth 2 (two) times daily.   guaifenesin 100 MG/5ML syrup Commonly known as: ROBITUSSIN Take 200 mg by mouth 3 (three) times daily as needed for cough.   hydrocortisone cream 1 % Apply 1 application topically daily as needed for itching (rash).   metoprolol tartrate 50 MG tablet Commonly known as: LOPRESSOR Take 25 mg by mouth 2 (two) times daily.        No Known Allergies  Consultations: Cardiology  Palliative Care Oncology  Critical care    Procedures/Studies: DG Chest 1 View  Result Date: 08/11/2020 CLINICAL DATA:  Shortness of breath.  Thoracentesis EXAM: CHEST  1 VIEW COMPARISON:  Yesterday FINDINGS: Negative for pneumothorax. Continued extensive opacification of the left chest from pulmonary opacity and small loculated pleural effusion by CT. Cardiopericardial enlargement with large pericardial effusion by CT. Improved aeration on the right, although persistent perihilar infiltrate.  IMPRESSION: 1. No acute finding related to thoracentesis. 2. No detected change from yesterday other than improved lung volumes. Electronically Signed   By: Monte Fantasia M.D.   On: 08/11/2020 10:29   CT Chest Wo Contrast  Result Date: 08/05/2020 CLINICAL DATA:  Shortness of breath EXAM: CT CHEST WITHOUT CONTRAST TECHNIQUE: Multidetector CT imaging of the chest was performed following the standard protocol without IV contrast. COMPARISON:  04/19/2020 FINDINGS: Cardiovascular: Large pericardial effusion measuring approximately 2.1 cm in thickness. Calcific aortic atherosclerosis. Mediastinum/Nodes: Increased density within the upper mediastinum, likely lymphadenopathy. There is left greater than right axillary adenopathy. Asymmetric density within the left breast. There is also left breast skin thickening. Lungs/Pleura: Left-greater-than-right heterogeneous interstitial and airspace opacity. Peribronchial thickening. Left lower lobe bronchi are occluded. There is complete complex of the left lower lobe. Upper Abdomen: 16 mm left adrenal mass. Musculoskeletal: Sclerotic lesions of the left second,, right third and left seventh ribs. Numerous sclerotic lesions within the vertebral bodies, greatest at T3. IMPRESSION: 1. Left breast heterogeneous density with skin thickening is concerning for carcinoma the skin thickening could be due to edema. Correlation with mammography is recommended. 2. Large pericardial effusion, measuring up to 2.1 cm in thickness, progressed. 3. Left-greater-than-right heterogeneous interstitial and airspace opacity, concerning for pulmonary edema or infection. Left lower lobe bronchi are occluded. Reportedly the patient has a history of non-small cell lung carcinoma, but no distinct mass is visualized on this noncontrast study. Aeration of both lungs is much worse than on 04/19/2020. 4. Left greater than right axillary and mediastinal lymphadenopathy, likely metastatic. 5. Sclerotic  lesions of the left second, right  third and left seventh ribs, consistent with metastatic disease. 6. 16 mm left adrenal mass, concerning for metastatic disease. Aortic Atherosclerosis (ICD10-I70.0). Electronically Signed   By: Ulyses Jarred M.D.   On: 08/05/2020 20:53   CARDIAC CATHETERIZATION  Result Date: 08/07/2020 Monitor patient closely in CCU under CCM guidance/Management Continue pericardial drain for 2-3 days till effusion is less than 30 ml per day.  DG Chest Port 1 View  Result Date: 08/10/2020 CLINICAL DATA:  Shortness of breath.  Extubation. EXAM: PORTABLE CHEST 1 VIEW COMPARISON:  08/09/2020 FINDINGS: Endotracheal tube and nasogastric tube have been removed. Again noted is marked consolidation and opacification throughout the left hemithorax with minimal aeration near the left lung apex. Left lung aeration has slightly decreased. Markedly decreased aeration in the right lung with increased patchy interstitial densities in the right lung. Cardiac silhouette is obscured by the lung densities. Negative for pneumothorax. Again noted is a small bore catheter overlying the left side of the heart. IMPRESSION: 1. Decreased aeration in both lungs following removal of the endotracheal tube. Difficult to exclude worsening edema or interstitial lung densities. 2. Stable position of the small bore tube overlying the left side of the chest. Electronically Signed   By: Markus Daft M.D.   On: 08/10/2020 09:02   DG CHEST PORT 1 VIEW  Result Date: 08/09/2020 CLINICAL DATA:  Intubation.  Respiratory failure. EXAM: PORTABLE CHEST 1 VIEW COMPARISON:  08/07/2020.  CT 08/05/2020. FINDINGS: Endotracheal tube, NG tube in stable position. Cardiomegaly again noted. Bilateral pulmonary infiltrates/edema, improved from prior exam. Large left pleural effusion. No pneumothorax. Degenerative changes scoliosis thoracic spine. IMPRESSION: 1.  Endotracheal tube and NG tube in stable position. 2. Cardiomegaly. Bilateral  pulmonary infiltrates/edema, improved from prior exam. Large left pleural effusion. Electronically Signed   By: Marcello Moores  Register   On: 08/09/2020 07:19   DG CHEST PORT 1 VIEW  Result Date: 08/07/2020 CLINICAL DATA:  75 year old female status post intubation. EXAM: PORTABLE CHEST 1 VIEW COMPARISON:  Chest radiograph dated 08/05/2020 and CT dated 08/05/2020. FINDINGS: Endotracheal tube with tip approximately 17 mm above the carina. Significant interval progression of bilateral pulmonary opacities compared to the prior radiograph likely representing worsening edema/CHF or pneumonia. Probable small bilateral pleural effusions. No pneumothorax. The cardiac borders are silhouetted. Atherosclerotic calcification of the aorta. No acute osseous pathology. Osteopenia. IMPRESSION: 1. Endotracheal tube above the carina. 2. Significant interval progression of bilateral pulmonary opacities likely representing worsening edema/CHF or pneumonia. Electronically Signed   By: Anner Crete M.D.   On: 08/07/2020 19:31   DG Chest Port 1 View  Result Date: 08/05/2020 CLINICAL DATA:  Cough and shortness of breath. EXAM: PORTABLE CHEST 1 VIEW COMPARISON:  February 23, 2020 FINDINGS: Markedly enlarged and globular cardiac silhouette. Calcific atherosclerotic disease of the aorta. Probable left pleural effusion. Left lower lobe airspace disease versus atelectasis. Bilateral interstitial opacities with central predominance. Osseous structures are without acute abnormality. Soft tissues are grossly normal. IMPRESSION: 1. Markedly enlarged and globular cardiac silhouette. Cardiomyopathy versus enlarging pericardial effusion. 2. Probable left pleural effusion with left lower lobe airspace disease versus atelectasis. 3. Bilateral interstitial opacities with central predominance may represent interstitial pulmonary edema. Electronically Signed   By: Fidela Salisbury M.D.   On: 08/05/2020 17:46   DG Abd Portable 1V  Result Date:  08/08/2020 CLINICAL DATA:  OG tube placement EXAM: PORTABLE ABDOMEN - 1 VIEW COMPARISON:  CT 04/19/2020, chest x-ray 08/07/2020 FINDINGS: Esophageal tube tip and side-port presumably project over the gastric body. Mild  diffuse air-filled bowel without definitive obstructive pattern. Catheter tubing projects over the left lower chest central abdomen and pelvis. Sclerotic lesion within the left anterior iliac spine and left pubic symphysis. IMPRESSION: 1. Esophageal tube tip and side port overlie the gastric body 2. Mild diffuse increased bowel gas without definitive obstructive pattern 3. Osseous sclerotic metastatic disease. Electronically Signed   By: Donavan Foil M.D.   On: 08/08/2020 16:13   ECHOCARDIOGRAM COMPLETE  Result Date: 08/06/2020    ECHOCARDIOGRAM REPORT   Patient Name:   Sara Ferguson Date of Exam: 08/06/2020 Medical Rec #:  [id number]      Height:       64.0 in Accession #:    [id number]     Weight:       142.9 lb Date of Birth:  November 23, 1945      BSA:          1.696 m Patient Age:    25 years       BP:           164/72 mmHg Patient Gender: F              HR:           101 bpm. Exam Location:  Inpatient Procedure: 2D Echo, Cardiac Doppler and Color Doppler Indications:     I31.3 Pericardial effusion  History:         Patient has prior history of Echocardiogram examinations, most                  recent 01/26/2020. Risk Factors:Hypertension and Diabetes.                  Cancer.  Sonographer:     Jonelle Sidle Dance Referring Phys:  [id number] Elmarie Shiley Diagnosing Phys: Dixie Dials MD IMPRESSIONS  1. Left ventricular ejection fraction, by estimation, is 60 to 65%. The left ventricle has normal function. The left ventricle has no regional wall motion abnormalities. Left ventricular diastolic parameters are consistent with Grade I diastolic dysfunction (impaired relaxation).  2. Right ventricular systolic function is normal. The right ventricular size is normal.  3. Moderate pericardial effusion. The  pericardial effusion is circumferential. There is no evidence of cardiac tamponade.  4. The mitral valve is degenerative. Mild mitral valve regurgitation.  5. The aortic valve is tricuspid. There is mild calcification of the aortic valve. There is mild thickening of the aortic valve. Aortic valve regurgitation is not visualized. Mild aortic valve sclerosis is present, with no evidence of aortic valve stenosis.  6. The inferior vena cava is dilated in size with <50% respiratory variability, suggesting right atrial pressure of 15 mmHg. 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 FINDINGS  Left Ventricle: Left ventricular ejection fraction, by estimation, is 60 to 65%. The left ventricle has normal function. The left ventricle has no regional wall motion abnormalities. The left ventricular internal cavity size was normal in size. There is  borderline concentric left ventricular hypertrophy. Left ventricular diastolic parameters are consistent with Grade I diastolic dysfunction (impaired relaxation). Right Ventricle: The right ventricular size is normal. No increase in right ventricular wall thickness. Right ventricular systolic function is normal. Left Atrium: Left atrial size was normal in size. Right Atrium: Right atrial size was normal in size. Pericardium: A moderately sized pericardial effusion is present. The pericardial effusion is circumferential. The pericardial effusion appears to contain mixed echogenic material. There is no evidence of cardiac tamponade. Mitral Valve: The mitral valve is  degenerative in appearance. There is mild thickening of the mitral valve leaflet(s). There is mild calcification of the mitral valve leaflet(s). Mild mitral annular calcification. Mild mitral valve regurgitation. Tricuspid Valve: The tricuspid valve is normal in structure. Tricuspid valve regurgitation is trivial. Aortic Valve: The aortic valve is tricuspid. There is mild  calcification of the aortic valve. There is mild thickening of the aortic valve. Aortic valve regurgitation is not visualized. Mild aortic valve sclerosis is present, with no evidence of aortic valve stenosis. Pulmonic Valve: The pulmonic valve was normal in structure. Pulmonic valve regurgitation is not visualized. Aorta: The aortic root is normal in size and structure. There is minimal (Grade I) atheroma plaque involving the ascending aorta. Venous: The inferior vena cava is dilated in size with less than 50% respiratory variability, suggesting right atrial pressure of 15 mmHg. IAS/Shunts: The atrial septum is grossly normal.  LEFT VENTRICLE PLAX 2D LVIDd:         3.70 cm  Diastology LVIDs:         2.10 cm  LV e' medial:    4.46 cm/s LV PW:         1.20 cm  LV E/e' medial:  17.7 LV IVS:        1.10 cm  LV e' lateral:   4.68 cm/s LVOT diam:     1.90 cm  LV E/e' lateral: 16.9 LV SV:         52 LV SV Index:   31 LVOT Area:     2.84 cm  RIGHT VENTRICLE             IVC RV Basal diam:  2.50 cm     IVC diam: 2.20 cm RV S prime:     10.40 cm/s TAPSE (M-mode): 1.8 cm LEFT ATRIUM             Index       RIGHT ATRIUM          Index LA diam:        4.20 cm 2.48 cm/m  RA Area:     9.54 cm LA Vol (A2C):   66.9 ml 39.45 ml/m RA Volume:   19.60 ml 11.56 ml/m LA Vol (A4C):   25.8 ml 15.21 ml/m LA Biplane Vol: 42.6 ml 25.12 ml/m  AORTIC VALVE LVOT Vmax:   97.00 cm/s LVOT Vmean:  66.700 cm/s LVOT VTI:    0.185 m  AORTA Ao Root diam: 2.80 cm Ao Asc diam:  2.70 cm MITRAL VALVE MV Area (PHT): 5.13 cm     SHUNTS MV Decel Time: 148 msec     Systemic VTI:  0.18 m MV E velocity: 79.00 cm/s   Systemic Diam: 1.90 cm MV A velocity: 138.00 cm/s MV E/A ratio:  0.57 Dixie Dials MD Electronically signed by Dixie Dials MD Signature Date/Time: 08/06/2020/12:02:39 PM    Final    ECHOCARDIOGRAM LIMITED  Result Date: 08/15/2020    ECHOCARDIOGRAM LIMITED REPORT   Patient Name:   Sara Ferguson Date of Exam: 08/15/2020 Medical Rec #:   [id number]      Height:       64.0 in Accession #:    [id number]     Weight:       134.9 lb Date of Birth:  1945-11-17      BSA:          1.655 m Patient Age:    75 years       BP:  135/55 mmHg Patient Gender: F              HR:           88 bpm. Exam Location:  Inpatient Procedure: Limited Echo and Limited Color Doppler Indications:    I31.3 Pericardial effusion (noninflammatory)  History:        Patient has prior history of Echocardiogram examinations, most                 recent 08/10/2020. Signs/Symptoms:Dyspnea; Risk                 Factors:Hypertension and Dyslipidemia. Cancer. Pericarditis.  Sonographer:    Jonelle Sidle Dance Referring Phys: [place]  1. Left ventricular ejection fraction, by estimation, is 55 to 60%. The left ventricle has normal function. The left ventricle has no regional wall motion abnormalities. There is mild concentric left ventricular hypertrophy of the anterior, septal and posterior segments. Left ventricular diastolic function could not be evaluated.  2. Right ventricular systolic function is normal. The right ventricular size is normal.  3. Left atrial size was moderately dilated.  4. A small pericardial effusion is present. The pericardial effusion is circumferential. There is no evidence of cardiac tamponade.  5. The mitral valve is degenerative. Mild mitral valve regurgitation.  6. The aortic valve is tricuspid. There is mild calcification of the aortic valve. There is mild thickening of the aortic valve. Aortic valve regurgitation is not visualized. Mild aortic valve sclerosis is present, with no evidence of aortic valve stenosis.  7. There is mild (Grade II) atheroma plaque involving the ascending aorta.  8. The inferior vena cava is dilated in size with >50% respiratory variability, suggesting right atrial pressure of 8 mmHg. FINDINGS  Left Ventricle: Left ventricular ejection fraction, by estimation, is 55 to 60%. The left ventricle has normal  function. The left ventricle has no regional wall motion abnormalities. The left ventricular internal cavity size was normal in size. There is  mild concentric left ventricular hypertrophy of the anterior, septal and posterior segments. Left ventricular diastolic function could not be evaluated. Right Ventricle: The right ventricular size is normal. No increase in right ventricular wall thickness. Right ventricular systolic function is normal. Left Atrium: Left atrial size was moderately dilated. Right Atrium: Right atrial size was normal in size. Pericardium: A small pericardial effusion is present. The pericardial effusion is circumferential. The pericardial effusion appears to contain fibrous material. There is no evidence of cardiac tamponade. Mitral Valve: The mitral valve is degenerative in appearance. Mild mitral valve regurgitation. Tricuspid Valve: The tricuspid valve is normal in structure. Tricuspid valve regurgitation is mild. Aortic Valve: The aortic valve is tricuspid. There is mild calcification of the aortic valve. There is mild thickening of the aortic valve. There is mild aortic valve annular calcification. Aortic valve regurgitation is not visualized. Mild aortic valve sclerosis is present, with no evidence of aortic valve stenosis. Pulmonic Valve: The pulmonic valve was normal in structure. Pulmonic valve regurgitation is not visualized. Aorta: The aortic root is normal in size and structure. There is mild (Grade II) atheroma plaque involving the ascending aorta. Venous: The inferior vena cava is dilated in size with greater than 50% respiratory variability, suggesting right atrial pressure of 8 mmHg. IAS/Shunts: The interatrial septum was not assessed. Additional Comments: There is a small pleural effusion in both left and right lateral regions. LEFT VENTRICLE PLAX 2D LVIDd:         3.00 cm LVIDs:  2.20 cm LV PW:         1.30 cm LV IVS:        1.20 cm LVOT diam:     1.50 cm LVOT Area:      1.77 cm  IVC IVC diam: 2.10 cm LEFT ATRIUM         Index LA diam:    3.70 cm 2.24 cm/m   AORTA Ao Root diam: 3.00 cm Ao Asc diam:  2.50 cm  SHUNTS Systemic Diam: 1.50 cm Dixie Dials MD Electronically signed by Dixie Dials MD Signature Date/Time: 08/15/2020/4:58:15 PM    Final    ECHOCARDIOGRAM LIMITED  Result Date: 08/10/2020    ECHOCARDIOGRAM LIMITED REPORT   Patient Name:   Sara Ferguson Date of Exam: 08/10/2020 Medical Rec #:  [id number]      Height:       64.0 in Accession #:    [id number]     Weight:       138.4 lb Date of Birth:  04/30/1945      BSA:          1.673 m Patient Age:    74 years       BP:           129/56 mmHg Patient Gender: F              HR:           89 bpm. Exam Location:  Inpatient Procedure: 2D Echo, Color Doppler and Cardiac Doppler Indications:     Pericardial effusion  History:         Patient has prior history of Echocardiogram examinations, most                  recent 08/08/2020. Signs/Symptoms:Shortness of Breath. Cancer.                  Pericarditis.  Sonographer:     Clayton Lefort RDCS (AE) Referring Phys:  [place] Diagnosing Phys: Dixie Dials MD IMPRESSIONS  1. Left ventricular ejection fraction, by estimation, is 60 to 65%. The left ventricle has normal function. The left ventricle has no regional wall motion abnormalities. There is moderate left ventricular hypertrophy. Left ventricular diastolic function  could not be evaluated.  2. Right ventricular systolic function is normal. The right ventricular size is normal.  3. Left atrial size was moderately dilated.  4. Right atrial size was moderately dilated.  5. A small pericardial effusion is present. The pericardial effusion is localized near the right ventricle, localized near the right atrium and surrounding the apex. There is no evidence of cardiac tamponade.  6. The mitral valve is degenerative. Mild mitral valve regurgitation.  7. The aortic valve is tricuspid. There is mild calcification of the aortic valve.  There is mild thickening of the aortic valve. Mild to moderate aortic valve sclerosis/calcification is present, without any evidence of aortic stenosis.  8. There is mild (Grade II) atheroma plaque involving the aortic root.  9. The inferior vena cava is normal in size with <50% respiratory variability, suggesting right atrial pressure of 8 mmHg. FINDINGS  Left Ventricle: Left ventricular ejection fraction, by estimation, is 60 to 65%. The left ventricle has normal function. The left ventricle has no regional wall motion abnormalities. There is moderate left ventricular hypertrophy. Left ventricular diastolic function could not be evaluated. Right Ventricle: The right ventricular size is normal. No increase in right ventricular wall thickness. Right ventricular systolic function is normal. Left  Atrium: Left atrial size was moderately dilated. Right Atrium: Right atrial size was moderately dilated. Pericardium: A small pericardial effusion is present. The pericardial effusion is localized near the right ventricle, localized near the right atrium and surrounding the apex. There is no evidence of cardiac tamponade. Mitral Valve: The mitral valve is degenerative in appearance. There is mild thickening of the mitral valve leaflet(s). There is mild calcification of the mitral valve leaflet(s). Mild mitral annular calcification. Mild mitral valve regurgitation. Tricuspid Valve: The tricuspid valve is normal in structure. Tricuspid valve regurgitation is mild. Aortic Valve: The aortic valve is tricuspid. There is mild calcification of the aortic valve. There is mild thickening of the aortic valve. There is mild aortic valve annular calcification. Mild to moderate aortic valve sclerosis/calcification is present, without any evidence of aortic stenosis. Pulmonic Valve: The pulmonic valve was normal in structure. Aorta: The aortic root is normal in size and structure. There is mild (Grade II) atheroma plaque involving the  aortic root. Venous: The inferior vena cava is normal in size with less than 50% respiratory variability, suggesting right atrial pressure of 8 mmHg. IAS/Shunts: The interatrial septum was not assessed. Dixie Dials MD Electronically signed by Dixie Dials MD Signature Date/Time: 08/10/2020/1:26:34 PM    Final    ECHOCARDIOGRAM LIMITED  Result Date: 08/08/2020    ECHOCARDIOGRAM LIMITED REPORT   Patient Name:   Sara Ferguson Date of Exam: 08/08/2020 Medical Rec #:  [id number]      Height:       64.0 in Accession #:    [id number]     Weight:       133.3 lb Date of Birth:  Jun 25, 1945      BSA:          1.647 m Patient Age:    80 years       BP:           116/47 mmHg Patient Gender: F              HR:           85 bpm. Exam Location:  Inpatient Procedure: Limited Echo Indications:     Pericardial effusion post pericardial centesis  History:         Patient has prior history of Echocardiogram examinations, most                  recent 08/06/2020. Signs/Symptoms:Shortness of Breath. Cancer.                  Pericarditis.  Sonographer:     [name] RDCS Referring Phys:  [place] Diagnosing Phys: Dixie Dials MD IMPRESSIONS  1. Left ventricular ejection fraction, by estimation, is 65 to 70%. The left ventricle has normal function. The left ventricle has no regional wall motion abnormalities. There is mild concentric left ventricular hypertrophy.  2. Right ventricular systolic function is normal.  3. Left atrial size was mildly dilated.  4. Right atrial size was mildly dilated.  5. Sucessful pericardiocentesis.  6. The mitral valve is degenerative.  7. The aortic valve is tricuspid. There is mild calcification of the aortic valve. There is mild thickening of the aortic valve.  8. There is mild (Grade II) atheroma plaque involving the ascending aorta. FINDINGS  Left Ventricle: Left ventricular ejection fraction, by estimation, is 65 to 70%. The left ventricle has normal function. The left ventricle has no  regional wall motion abnormalities. There is mild concentric left ventricular hypertrophy. Right Ventricle:  Right ventricular systolic function is normal. Left Atrium: Left atrial size was mildly dilated. Right Atrium: Right atrial size was mildly dilated. Pericardium: Sucessful pericardiocentesis. There is no evidence of pericardial effusion. Mitral Valve: The mitral valve is degenerative in appearance. Tricuspid Valve: The tricuspid valve is normal in structure. Aortic Valve: The aortic valve is tricuspid. There is mild calcification of the aortic valve. There is mild thickening of the aortic valve. There is mild aortic valve annular calcification. Pulmonic Valve: The pulmonic valve was normal in structure. Aorta: The aortic root is normal in size and structure. There is mild (Grade II) atheroma plaque involving the ascending aorta. Venous: The inferior vena cava was not well visualized. Dixie Dials MD Electronically signed by Dixie Dials MD Signature Date/Time: 08/08/2020/3:22:53 PM    Final      Procedures: thoracentesis Pericardiocentesis   Subjective: Patient is feeling well, continue to be very weak and deconditioned, no nausea or vomiting.   Discharge Exam: Vitals:   08/16/20 0340 08/16/20 0814  BP: (!) 145/67 (!) 130/57  Pulse: 85 97  Resp: 18 18  Temp: 98.3 F (36.8 C) 97.8 F (36.6 C)  SpO2: 100% 99%   Vitals:   08/15/20 1138 08/15/20 1941 08/16/20 0340 08/16/20 0814  BP: (!) 142/61 (!) 155/50 (!) 145/67 (!) 130/57  Pulse: 83 91 85 97  Resp:  18 18 18   Temp: 98 F (36.7 C) 98.1 F (36.7 C) 98.3 F (36.8 C) 97.8 F (36.6 C)  TempSrc: Oral Oral Oral Oral  SpO2:  96% 100% 99%  Weight:   61.3 kg   Height:        General: Not in pain or dyspnea.  Neurology: Awake and alert, non focal  E ENT: no pallor, no icterus, oral mucosa moist Cardiovascular: No JVD. S1-S2 present, rhythmic, no gallops, rubs, or murmurs. No lower extremity edema. Pulmonary: positive breath sounds  bilaterally, adequate air movement, no wheezing, rhonchi or rales. Gastrointestinal. Abdomen soft and non tender Skin. No rashes Musculoskeletal: no joint deformities   The results of significant diagnostics from this hospitalization (including imaging, microbiology, ancillary and laboratory) are listed below for reference.     Microbiology: Recent Results (from the past 240 hour(s))  Culture, body fluid w Gram Stain-bottle     Status: None   Collection Time: 08/07/20  3:25 PM   Specimen: Fluid  Result Value Ref Range Status   Specimen Description FLUID PERICARDIAL  Final   Special Requests BOTTLES DRAWN AEROBIC AND ANAEROBIC  Final   Culture   Final    NO GROWTH 5 DAYS Performed at [facility], [address], [place], [place] [address]    Report Status 08/12/2020 FINAL  Final  Gram stain     Status: None   Collection Time: 08/07/20  3:25 PM   Specimen: Fluid  Result Value Ref Range Status   Specimen Description FLUID PERICARDIAL  Final   Special Requests NONE  Final   Gram Stain   Final    RARE WBC PRESENT,BOTH PMN AND MONONUCLEAR NO ORGANISMS SEEN Performed at [facility], [place] [address], [place], [place] [address]    Report Status 08/07/2020 FINAL  Final  MRSA Next Gen by PCR, Nasal     Status: None   Collection Time: 08/07/20  5:36 PM   Specimen: Nasal Mucosa; Nasal Swab  Result Value Ref Range Status   MRSA by PCR Next Gen NOT DETECTED NOT DETECTED Final    Comment: (NOTE) The  GeneXpert MRSA Assay (FDA approved for NASAL specimens only), is one component of a comprehensive MRSA colonization surveillance program. It is not intended to diagnose MRSA infection nor to guide or monitor treatment for MRSA infections. Test performance is not FDA approved in patients less than 89 years old. Performed at [facility], [place] [address] [place], [place] [address]   Culture, Respiratory w Gram Stain     Status: None   Collection Time: 08/09/20 10:35 AM    Specimen: Tracheal Aspirate; Respiratory  Result Value Ref Range Status   Specimen Description TRACHEAL ASPIRATE  Final   Special Requests NONE  Final   Gram Stain   Final    FEW WBC PRESENT,BOTH PMN AND MONONUCLEAR NO ORGANISMS SEEN    Culture   Final    RARE Normal respiratory flora-no Staph aureus or Pseudomonas seen Performed at [facility], [address], [place], [place] [address]    Report Status 08/11/2020 FINAL  Final     Labs: BNP (last 3 results) Recent Labs    01/01/20 0617 01/25/20 1158 08/05/20 2000  BNP 55.0 118.3* 25.3   Basic Metabolic Panel: Recent Labs  Lab 08/10/20 0045 08/11/20 0232 08/12/20 0030 08/15/20 0402  NA 135 131* 135 133*  K 4.0 4.0 4.0 4.0  CL 103 98 104 98  CO2 22 24 25 26   GLUCOSE 160* 145* 194* 149*  BUN 47* 46* 33* 16  CREATININE 1.67* 1.10* 0.86 0.75  CALCIUM 8.8* 8.7* 8.6* 8.9  MG 1.9  --   --  1.5*  PHOS 4.3  --   --   --    Liver Function Tests: No results for input(s): AST, ALT, ALKPHOS, BILITOT, PROT, ALBUMIN in the last 168 hours. No results for input(s): LIPASE, AMYLASE in the last 168 hours. No results for input(s): AMMONIA in the last 168 hours. CBC: Recent Labs  Lab 08/10/20 0045 08/12/20 0030 08/15/20 0402  WBC 10.5 5.6 5.2  HGB 11.6* 11.4* 10.7*  HCT 36.0 34.8* 32.6*  MCV 89.8 89.5 87.9  PLT 190 237 268   Cardiac Enzymes: No results for input(s): CKTOTAL, CKMB, CKMBINDEX, TROPONINI in the last 168 hours. BNP: Invalid input(s): POCBNP CBG: Recent Labs  Lab 08/15/20 0526 08/15/20 1115 08/15/20 1554 08/15/20 2059 08/16/20 0557  GLUCAP 125* 164* 161* 176* 113*   D-Dimer No results for input(s): DDIMER in the last 72 hours. Hgb A1c No results for input(s): HGBA1C in the last 72 hours. Lipid Profile No results for input(s): CHOL, HDL, LDLCALC, TRIG, CHOLHDL, LDLDIRECT in the last 72 hours. Thyroid function studies No results for input(s): TSH, T4TOTAL, T3FREE, THYROIDAB in the last 72  hours.  Invalid input(s): FREET3 Anemia work up No results for input(s): VITAMINB12, FOLATE, FERRITIN, TIBC, IRON, RETICCTPCT in the last 72 hours. Urinalysis No results found for: COLORURINE, APPEARANCEUR, [place], [place], [place], [place], [place], [place], PROTEINUR, UROBILINOGEN, NITRITE, LEUKOCYTESUR Sepsis Labs Invalid input(s): PROCALCITONIN,  WBC,  LACTICIDVEN Microbiology Recent Results (from the past 240 hour(s))  Culture, body fluid w Gram Stain-bottle     Status: None   Collection Time: 08/07/20  3:25 PM   Specimen: Fluid  Result Value Ref Range Status   Specimen Description FLUID PERICARDIAL  Final   Special Requests BOTTLES DRAWN AEROBIC AND ANAEROBIC  Final   Culture   Final    NO GROWTH 5 DAYS Performed at [facility], [address] [place],  [address]    Report Status 08/12/2020 FINAL  Final  Gram stain     Status: None   Collection Time: 08/07/20  3:25 PM   Specimen: Fluid  Result Value Ref Range Status   Specimen Description FLUID PERICARDIAL  Final   Special Requests NONE  Final   Gram Stain   Final    RARE WBC PRESENT,BOTH PMN AND MONONUCLEAR NO ORGANISMS SEEN Performed at [facility], [address], [place], [place] [address]    Report Status 08/07/2020 FINAL  Final  MRSA Next Gen by PCR, Nasal     Status: None   Collection Time: 08/07/20  5:36 PM   Specimen: Nasal Mucosa; Nasal Swab  Result Value Ref Range Status   MRSA by PCR Next Gen NOT DETECTED NOT DETECTED Final    Comment: (NOTE) The GeneXpert MRSA Assay (FDA approved for NASAL specimens only), is one component of a comprehensive MRSA colonization surveillance program. It is not intended to diagnose MRSA infection nor to guide or monitor treatment for MRSA infections. Test performance is not FDA approved in patients less than 32 years old. Performed at [facility], [place] [address] [place], [place] [address]   Culture, Respiratory w Gram Stain     Status:  None   Collection Time: 08/09/20 10:35 AM   Specimen: Tracheal Aspirate; Respiratory  Result Value Ref Range Status   Specimen Description TRACHEAL ASPIRATE  Final   Special Requests NONE  Final   Gram Stain   Final    FEW WBC PRESENT,BOTH PMN AND MONONUCLEAR NO ORGANISMS SEEN    Culture   Final    RARE Normal respiratory flora-no Staph aureus or Pseudomonas seen Performed at [facility], [address], [place], [place] [address]    Report Status 08/11/2020 FINAL  Final     Time coordinating discharge: 45 minutes  SIGNED:   Tawni Millers, MD  [facility] 08/16/2020, 11:40 AM

## 2020-08-16 NOTE — Progress Notes (Signed)
This chaplain is present for F/U spiritual care.  The chaplain in the recent visits has seen the Pt. move to sitting up in a chair and attentively engaging in conversation with the chaplain.  The Pt. shares her appetite has improved and is connected to what is being served. The chaplain understands the Pt. willingly accepts participating in SNF for a couple of weeks, but wants to go back to [place]. The Pt. is not clear about plans for d/c.  The Pt. accepted the chaplain's invitation for prayer and is appreciative of the visits.

## 2020-08-16 NOTE — TOC Progression Note (Signed)
Transition of Care [facility]) - Progression Note    Patient Details  Name: Sara Ferguson MRN: [id number] Date of Birth: Jan 17, 1946  [facility] CM/SW Contact  Reece Agar, [place] Phone Number: 08/16/2020, 11:50 AM  Clinical Narrative:    7048: CSW spoke with Elon Alas at [facility] for bed availability, bed will be available tomorrow. Pt niece [name] states that she can fill out paperwork in the morning before work.         Expected Discharge Plan and Services           Expected Discharge Date: 08/16/20                                     Social Determinants of Health (SDOH) Interventions    Readmission Risk Interventions No flowsheet data found.

## 2020-08-16 NOTE — Progress Notes (Signed)
Physical Therapy Treatment Patient Details Name: Sara Ferguson MRN: [id number] DOB: November 30, 1945 Today's Date: 08/16/2020    History of Present Illness Pt is a 75 y/o female presenting on 6/19 to ER with 1 week of progressive shortness of breath, LE edema, and chest discomfort.  Pt currently on oral chemotherapy. CT chest shows left breast heterogeneous density with skin thickening concerning for carcinoma, left pericardial effusion  and left greater than right heterogeneous interstitial and airspace opacity possibly pulmonary edema.  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  Also left adrenal mass concerning for metastatic disease. Pt admitted for pericardial effusion. Pt underwent pericardiocentesis for pericardial effusion with PEA arrest (3-4 min CPR). Pt extubated 6/23. Pending cytology from thoracentesis on 6/25 and molecular studies from pericardial fluid. PMH: non-small cell lung cancer stage IV, recurrent malignant pleural effusion, diabetes, essential hypertension, osteoarthritis.    PT Comments    Pt progresses in ability to perform transfers and ambulate this session, without requiring physical assistance for performance. Pt tolerates ambulation for increased distances with use of RW, limited by fatigue and requiring one seated rest break. Pt continues to require cues for safely returning to sitting. Pt demonstrates generalized weakness and deficits in endurance, activity tolerance, as well as balance and gait and will benefit from continued acute PT to return to prior level.  Follow Up Recommendations  SNF;Supervision for mobility/OOB     Equipment Recommendations  3in1 (PT);Wheelchair (measurements PT);Wheelchair cushion (measurements PT)    Recommendations for Other Services       Precautions / Restrictions Precautions Precautions: Fall;Other (comment) Precaution Comments: monitor O2 Restrictions Weight Bearing Restrictions: No     Mobility  Bed Mobility               General bed mobility comments: received in recliner    Transfers Overall transfer level: Needs assistance Equipment used: Rolling walker (2 wheeled) Transfers: Sit to/from Stand Sit to Stand: Min guard (2x)         General transfer comment: Pt requires reminders to reach for armrests to sit  Ambulation/Gait Ambulation/Gait assistance: Min guard Gait Distance (Feet): 50 Feet (50 ft + additional 20 ft with seated rest break between) Assistive device: Rolling walker (2 wheeled) Gait Pattern/deviations: Decreased stride length;Step-through pattern;Trunk flexed Gait velocity: reduced Gait velocity interpretation: 1.31 - 2.62 ft/sec, indicative of limited community ambulator General Gait Details: Pt initally ambulates for 20 ft, requiring seated rest break. Pt ambulates for additional 50 ft and limited by fatigue.   Stairs             Wheelchair Mobility    Modified Rankin (Stroke Patients Only)       Balance Overall balance assessment: Needs assistance Sitting-balance support: Feet supported Sitting balance-Leahy Scale: Fair     Standing balance support: During functional activity;Bilateral upper extremity supported;Single extremity supported Standing balance-Leahy Scale: Poor Standing balance comment: pt reliant on at least single UE support for stability                            Cognition Arousal/Alertness: Awake/alert Behavior During Therapy: WFL for tasks assessed/performed Overall Cognitive Status: Within Functional Limits for tasks assessed                                 General Comments: Pt follows one step commands consistenly and now promptly.  Exercises General Exercises - Lower Extremity Ankle Circles/Pumps: Both;10 reps;Seated Long Arc Quad: Both;10 reps;Seated Hip Flexion/Marching: Both;10 reps;Seated    General Comments General comments (skin integrity, edema,  etc.): Pt initally on 3 L satting 96% at rest. Pt requiring up to 4 L [place] to maintain oxygen sat levels above 92%. Pt returned to 3 L at end of session with oxygen sat levels at or above 93%.      Pertinent Vitals/Pain Pain Assessment: No/denies pain    Home Living                      Prior Function            PT Goals (current goals can now be found in the care plan section) Acute Rehab PT Goals Patient Stated Goal: get better Progress towards PT goals: Progressing toward goals    Frequency    Min 2X/week      PT Plan Current plan remains appropriate    Co-evaluation              AM-PAC PT "6 Clicks" Mobility   Outcome Measure  Help needed turning from your back to your side while in a flat bed without using bedrails?: None Help needed moving from lying on your back to sitting on the side of a flat bed without using bedrails?: A Little Help needed moving to and from a bed to a chair (including a wheelchair)?: A Little Help needed standing up from a chair using your arms (e.g., wheelchair or bedside chair)?: A Little Help needed to walk in hospital room?: A Little Help needed climbing 3-5 steps with a railing? : A Lot 6 Click Score: 18    End of Session Equipment Utilized During Treatment: Gait belt;Oxygen Activity Tolerance: Patient limited by fatigue Patient left: in chair;with chair alarm set;with call bell/phone within reach Nurse Communication: Mobility status PT Visit Diagnosis: Unsteadiness on feet (R26.81);Other abnormalities of gait and mobility (R26.89);Muscle weakness (generalized) (M62.81)     Time: 1636-1700 PT Time Calculation (min) (ACUTE ONLY): 24 min  Charges:  $Gait Training: 8-22 mins $Therapeutic Activity: 8-22 mins                     Acute Rehab  Pager: [phone]    [name], SPT  08/16/2020, 5:45 PM

## 2020-08-16 NOTE — Consult Note (Signed)
Ref: [name], MD   Subjective:  Awake. No chest pain or shortness of breath. Echocardiogram shows minimal pericardial effusion.  Objective:  Vital Signs in the last 24 hours: Temp:  [97.8 F (36.6 C)-98.3 F (36.8 C)] 98.2 F (36.8 C) (06/30 1947) Pulse Rate:  [85-97] 87 (06/30 1947) Cardiac Rhythm: Normal sinus rhythm (06/30 0800) Resp:  [16-18] 16 (06/30 1947) BP: (130-187)/(57-93) 154/68 (06/30 1947) SpO2:  [98 %-100 %] 98 % (06/30 1947) Weight:  [61.3 kg] 61.3 kg (06/30 0340)  Physical Exam: BP Readings from Last 1 Encounters:  08/16/20 (!) 154/68     Wt Readings from Last 1 Encounters:  08/16/20 61.3 kg    Weight change: 0.081 kg Body mass index is 23.19 kg/m. HEENT: [place]/AT, Eyes-Brown, PERL, EOMI, Conjunctiva-Pink, Sclera-Non-icteric Neck: No JVD, No bruit, Trachea midline. Lungs:  Clear, Bilateral. Cardiac:  Regular rhythm, normal S1 and S2, no S3. II/VI systolic murmur. Abdomen:  Soft, non-tender. BS present. Extremities:  No edema present. No cyanosis. No clubbing. CNS: AxOx3, Cranial nerves grossly intact, moves all 4 extremities.  Skin: Warm and dry.   Intake/Output from previous day: 06/29 0701 - 06/30 0700 In: 360 [P.O.:360] Out: -     Lab Results: BMET    Component Value Date/Time   NA 133 (L) 08/15/2020 0402   NA 135 08/12/2020 0030   NA 131 (L) 08/11/2020 0232   K 4.0 08/15/2020 0402   K 4.0 08/12/2020 0030   K 4.0 08/11/2020 0232   CL 98 08/15/2020 0402   CL 104 08/12/2020 0030   CL 98 08/11/2020 0232   CO2 26 08/15/2020 0402   CO2 25 08/12/2020 0030   CO2 24 08/11/2020 0232   GLUCOSE 149 (H) 08/15/2020 0402   GLUCOSE 194 (H) 08/12/2020 0030   GLUCOSE 145 (H) 08/11/2020 0232   BUN 16 08/15/2020 0402   BUN 33 (H) 08/12/2020 0030   BUN 46 (H) 08/11/2020 0232   CREATININE 0.75 08/15/2020 0402   CREATININE 0.86 08/12/2020 0030   CREATININE 1.10 (H) 08/11/2020 0232   CREATININE 0.79 04/26/2020 1513   CREATININE 0.81 03/29/2020  1621   CREATININE 0.77 03/01/2020 1623   CALCIUM 8.9 08/15/2020 0402   CALCIUM 8.6 (L) 08/12/2020 0030   CALCIUM 8.7 (L) 08/11/2020 0232   GFRNONAA >60 08/15/2020 0402   GFRNONAA >60 08/12/2020 0030   GFRNONAA 52 (L) 08/11/2020 0232   GFRNONAA >60 04/26/2020 1513   GFRNONAA >60 03/29/2020 1621   GFRNONAA >60 03/01/2020 1623   CBC    Component Value Date/Time   WBC 5.2 08/15/2020 0402   RBC 3.71 (L) 08/15/2020 0402   HGB 10.7 (L) 08/15/2020 0402   HCT 32.6 (L) 08/15/2020 0402   PLT 268 08/15/2020 0402   MCV 87.9 08/15/2020 0402   MCH 28.8 08/15/2020 0402   MCHC 32.8 08/15/2020 0402   RDW 12.7 08/15/2020 0402   LYMPHSABS 0.9 08/09/2020 0647   MONOABS 1.0 08/09/2020 0647   EOSABS 0.0 08/09/2020 0647   BASOSABS 0.0 08/09/2020 0647   HEPATIC Function Panel Recent Labs    05/24/20 1517 08/06/20 0330 08/09/20 0647  PROT 8.7* 7.4 6.5   HEMOGLOBIN A1C No components found for: HGA1C,  MPG CARDIAC ENZYMES No results found for: CKTOTAL, CKMB, CKMBINDEX, TROPONINI BNP No results for input(s): PROBNP in the last 8760 hours. TSH No results for input(s): TSH in the last 8760 hours. CHOLESTEROL No results for input(s): CHOL in the last 8760 hours.  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 SNF soon.   LOS: 11 days   Time spent including chart review, lab review,  examination, discussion with patient : 25 min   Dixie Dials  MD  08/16/2020, 7:55 PM

## 2020-08-17 LAB — GLUCOSE, CAPILLARY
Glucose-Capillary: 147 mg/dL — ABNORMAL HIGH (ref 70–99)
Glucose-Capillary: 172 mg/dL — ABNORMAL HIGH (ref 70–99)

## 2020-08-17 NOTE — Progress Notes (Signed)
Patient remained stable for discharge, currently awaiting for transfer to skilled nursing facility.

## 2020-08-17 NOTE — Plan of Care (Signed)
  Problem: Education: Goal: Knowledge of General Education information will improve Description: Including pain rating scale, medication(s)/side effects and non-pharmacologic comfort measures Outcome: Adequate for Discharge   Problem: Health Behavior/Discharge Planning: Goal: Ability to manage health-related needs will improve Outcome: Adequate for Discharge   Problem: Clinical Measurements: Goal: Ability to maintain clinical measurements within normal limits will improve Outcome: Adequate for Discharge Goal: Will remain free from infection Outcome: Adequate for Discharge Goal: Diagnostic test results will improve Outcome: Adequate for Discharge Goal: Respiratory complications will improve Outcome: Adequate for Discharge Goal: Cardiovascular complication will be avoided Outcome: Adequate for Discharge   Problem: Activity: Goal: Risk for activity intolerance will decrease Outcome: Adequate for Discharge   Problem: Nutrition: Goal: Adequate nutrition will be maintained Outcome: Adequate for Discharge   Problem: Coping: Goal: Level of anxiety will decrease Outcome: Adequate for Discharge   Problem: Elimination: Goal: Will not experience complications related to bowel motility Outcome: Adequate for Discharge Goal: Will not experience complications related to urinary retention Outcome: Adequate for Discharge   Problem: Activity: Goal: Ability to tolerate increased activity will improve Outcome: Adequate for Discharge   Problem: Respiratory: Goal: Ability to maintain a clear airway and adequate ventilation will improve Outcome: Adequate for Discharge   Problem: Role Relationship: Goal: Method of communication will improve Outcome: Adequate for Discharge

## 2020-08-17 NOTE — Plan of Care (Signed)
  Problem: Skin Integrity: Goal: Risk for impaired skin integrity will decrease Outcome: Completed/Met   Problem: Safety: Goal: Ability to remain free from injury will improve Outcome: Completed/Met   Problem: Pain Managment: Goal: General experience of comfort will improve Outcome: Completed/Met   

## 2020-08-17 NOTE — TOC Transition Note (Signed)
Transition of Care [facility]) - CM/SW Discharge Note   Patient Details  Name: Sara Ferguson MRN: [id number] Date of Birth: 28-Apr-1945  [facility] CM/SW Contact:  Tresa Endo Phone Number: 08/17/2020, 11:52 AM   Clinical Narrative:    Patient will DC to: [facility] Anticipated DC date: 08/17/2020 Family notified: Pt Niece Transport by: Corey Harold   Per MD patient ready for DC to [facility] room 204. RN to call report prior to discharge [phone]). RN, patient, patient's family, and facility notified of DC. Discharge Summary and FL2 sent to facility. DC packet on chart. Ambulance transport requested for patient.   CSW will sign off for now as social work intervention is no longer needed. Please consult [place] again if new needs arise.           Patient Goals and CMS Choice        Discharge Placement                       Discharge Plan and Services                                     Social Determinants of Health (SDOH) Interventions     Readmission Risk Interventions No flowsheet data found.

## 2020-08-17 NOTE — Progress Notes (Signed)
Occupational Therapy Treatment Patient Details Name: Sara Ferguson MRN: [id number] DOB: 1945/11/29 Today's Date: 08/17/2020    History of present illness Pt is a 75 y/o female presenting on 6/19 to ER with 1 week of progressive shortness of breath, LE edema, and chest discomfort.  Pt currently on oral chemotherapy. CT chest shows left breast heterogeneous density with skin thickening concerning for carcinoma, left pericardial effusion  and left greater than right heterogeneous interstitial and airspace opacity possibly pulmonary edema.  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  Also left adrenal mass concerning for metastatic disease. Pt admitted for pericardial effusion. Pt underwent pericardiocentesis for pericardial effusion with PEA arrest (3-4 min CPR). Pt extubated 6/23. Pending cytology from thoracentesis on 6/25 and molecular studies from pericardial fluid. PMH: non-small cell lung cancer stage IV, recurrent malignant pleural effusion, diabetes, essential hypertension, osteoarthritis.   OT comments  Pt progressing well towards OT goals with improving awareness of deficits. Pt endorses feeling continued deficits in endurance, strength and standing balance but motivated by functional gains. Pt overall supervision for mobility to/from bathroom using RW though needed a seated rest break on bathroom exit. Reinforced energy conservation strategies, as well as fall prevention strategies to maximize safety at home with handout provided.   SpO2 89% on 3 L O2 with activity, recovers quickly to 90s at rest on 3 L O2.    Follow Up Recommendations  SNF;Supervision/Assistance - 24 hour    Equipment Recommendations  3 in 1 bedside commode;Other (comment) (rolling walker)    Recommendations for Other Services      Precautions / Restrictions Precautions Precautions: Fall;Other (comment) Precaution Comments: monitor O2 Restrictions Weight Bearing Restrictions:  No       Mobility Bed Mobility Overal bed mobility: Modified Independent Bed Mobility: Supine to Sit                Transfers Overall transfer level: Needs assistance Equipment used: Rolling walker (2 wheeled) Transfers: Sit to/from Stand Sit to Stand: Supervision              Balance Overall balance assessment: Needs assistance Sitting-balance support: Feet supported Sitting balance-Leahy Scale: Fair     Standing balance support: During functional activity;Bilateral upper extremity supported;Single extremity supported Standing balance-Leahy Scale: Poor Standing balance comment: pt reliant on at least single UE support for stability                           ADL either performed or assessed with clinical judgement   ADL Overall ADL's : Needs assistance/impaired                         Toilet Transfer: Supervision/safety;Ambulation;RW;Regular Glass blower/designer Details (indicate cue type and reason): Supervision for safety, minor unsteadiness but able to self correct, cues for DME use           General ADL Comments: Improving mobility with ADLs, continues to be limited by decreased endurance but able to recognize when rest breaks needed. reinforced energy conservation strategies and safety awarenes. fall prevention handout provided for improved carryover     Vision   Vision Assessment?: No apparent visual deficits   Perception     Praxis      Cognition Arousal/Alertness: Awake/alert Behavior During Therapy: WFL for tasks assessed/performed Overall Cognitive Status: Within Functional Limits for tasks assessed  General Comments: WFL, cues for safe DME use but improved safety awareness and awareness of deficits        Exercises     Shoulder Instructions       General Comments received on 3 L O2, 96% at rest, 89% after activity - improved to low 90s with rest break and cues  for pursed lip breathing    Pertinent Vitals/ Pain       Pain Assessment: No/denies pain  Home Living                                          Prior Functioning/Environment              Frequency  Min 2X/week        Progress Toward Goals  OT Goals(current goals can now be found in the care plan section)  Progress towards OT goals: Progressing toward goals  Acute Rehab OT Goals Patient Stated Goal: improve strength and balance OT Goal Formulation: With patient Time For Goal Achievement: 08/24/20 Potential to Achieve Goals: Good ADL Goals Pt Will Perform Lower Body Bathing: with min assist;sit to/from stand;sitting/lateral leans Pt Will Perform Lower Body Dressing: with min assist;sitting/lateral leans;sit to/from stand Pt Will Transfer to Toilet: ambulating;with min assist Pt Will Perform Toileting - Clothing Manipulation and hygiene: with min assist;sitting/lateral leans;sit to/from stand Additional ADL Goal #1: Pt to verbalize at least 3 energy conservation to implement during ADLs/mobility  Plan Discharge plan remains appropriate    Co-evaluation                 AM-PAC OT "6 Clicks" Daily Activity     Outcome Measure   Help from another person eating meals?: A Little Help from another person taking care of personal grooming?: A Little Help from another person toileting, which includes using toliet, bedpan, or urinal?: A Little Help from another person bathing (including washing, rinsing, drying)?: A Little Help from another person to put on and taking off regular upper body clothing?: A Little Help from another person to put on and taking off regular lower body clothing?: A Little 6 Click Score: 18    End of Session Equipment Utilized During Treatment: Rolling walker;Oxygen  OT Visit Diagnosis: Unsteadiness on feet (R26.81);Other abnormalities of gait and mobility (R26.89);Muscle weakness (generalized) (M62.81)   Activity Tolerance  Patient tolerated treatment well   Patient Left in chair;with call bell/phone within reach;with chair alarm set   Nurse Communication Mobility status        Time: 0354-6568 OT Time Calculation (min): 18 min  Charges: OT General Charges $OT Visit: 1 Visit OT Treatments $Self Care/Home Management : 8-22 mins  Malachy Chamber, OTR/L Acute Rehab Services Office: [phone]    Layla Maw 08/17/2020, 9:58 AM

## 2020-08-21 ENCOUNTER — Inpatient Hospital Stay (HOSPITAL_BASED_OUTPATIENT_CLINIC_OR_DEPARTMENT_OTHER): Payer: Self-pay | Admitting: Hematology and Oncology

## 2020-08-21 ENCOUNTER — Other Ambulatory Visit: Payer: Self-pay

## 2020-08-21 ENCOUNTER — Encounter: Payer: Self-pay | Admitting: *Deleted

## 2020-08-21 ENCOUNTER — Inpatient Hospital Stay: Payer: Self-pay | Attending: Physician Assistant

## 2020-08-21 ENCOUNTER — Encounter: Payer: Self-pay | Admitting: Hematology and Oncology

## 2020-08-21 VITALS — BP 167/88 | HR 82 | Temp 98.2°F | Resp 18 | Ht 64.0 in | Wt 134.1 lb

## 2020-08-21 DIAGNOSIS — C3492 Malignant neoplasm of unspecified part of left bronchus or lung: Secondary | ICD-10-CM

## 2020-08-21 DIAGNOSIS — C3432 Malignant neoplasm of lower lobe, left bronchus or lung: Secondary | ICD-10-CM | POA: Insufficient documentation

## 2020-08-21 DIAGNOSIS — C3490 Malignant neoplasm of unspecified part of unspecified bronchus or lung: Secondary | ICD-10-CM

## 2020-08-21 LAB — COMPREHENSIVE METABOLIC PANEL
ALT: 51 U/L — ABNORMAL HIGH (ref 0–44)
AST: 54 U/L — ABNORMAL HIGH (ref 15–41)
Albumin: 2.7 g/dL — ABNORMAL LOW (ref 3.5–5.0)
Alkaline Phosphatase: 130 U/L — ABNORMAL HIGH (ref 38–126)
Anion gap: 10 (ref 5–15)
BUN: 12 mg/dL (ref 8–23)
CO2: 27 mmol/L (ref 22–32)
Calcium: 9.3 mg/dL (ref 8.9–10.3)
Chloride: 99 mmol/L (ref 98–111)
Creatinine, Ser: 0.7 mg/dL (ref 0.44–1.00)
GFR, Estimated: >60 mL/min (ref >60)
Glucose, Bld: 139 mg/dL — ABNORMAL HIGH (ref 70–99)
Potassium: 4.2 mmol/L (ref 3.5–5.1)
Sodium: 136 mmol/L (ref 135–145)
Total Bilirubin: 0.7 mg/dL (ref 0.3–1.2)
Total Protein: 7.8 g/dL (ref 6.5–8.1)

## 2020-08-21 LAB — CBC WITH DIFFERENTIAL/PLATELET
Abs Immature Granulocytes: 0.03 10*3/uL (ref 0.00–0.07)
Basophils Absolute: 0 10*3/uL (ref 0.0–0.1)
Basophils Relative: 0 %
Eosinophils Absolute: 0 10*3/uL (ref 0.0–0.5)
Eosinophils Relative: 0 %
HCT: 35.2 % — ABNORMAL LOW (ref 36.0–46.0)
Hemoglobin: 11.5 g/dL — ABNORMAL LOW (ref 12.0–15.0)
Immature Granulocytes: 0 %
Lymphocytes Relative: 10 %
Lymphs Abs: 0.8 10*3/uL (ref 0.7–4.0)
MCH: 28.5 pg (ref 26.0–34.0)
MCHC: 32.7 g/dL (ref 30.0–36.0)
MCV: 87.1 fL (ref 80.0–100.0)
Monocytes Absolute: 0.8 10*3/uL (ref 0.1–1.0)
Monocytes Relative: 10 %
Neutro Abs: 6.2 10*3/uL (ref 1.7–7.7)
Neutrophils Relative %: 80 %
Platelets: 426 10*3/uL — ABNORMAL HIGH (ref 150–400)
RBC: 4.04 MIL/uL (ref 3.87–5.11)
RDW: 13 % (ref 11.5–15.5)
WBC: 7.8 10*3/uL (ref 4.0–10.5)
nRBC: 0 % (ref 0.0–0.2)

## 2020-08-21 NOTE — Assessment & Plan Note (Addendum)
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  Most recent PET scan suggested progression of disease with worsening pericardial effusion, pleural disease, questionable lymphangitic spread, worsening lymphadenopathy in the axilla, mediastinum.  She had pericardiocentesis, cytology reactive cells.  She has pleural tap, cytology positive for adenocarcinoma.  Physical examination today, palpable axillary lymph nodes, left breast skin changes but no definitive mass, left lower lobe decreased air entry, clear heart sounds, mild lower extremity edema. I recommended completing staging with repeat CT abdomen as well as MRI brain.  If she has evidence of worsening intracranial metastatic disease, we may have to proceed with radiation first followed by systemic therapy.  We have also sent the pleural fluid for repeat analysis to [facility].  If she does have any evidence of targetable mutations, we would like to try targeted therapy.  If she does not have evidence of actionable mutations, we will proceed with [place], pemetrexed and pembrolizumab at reduced dose. If she continues to deteriorate, her ECOG worsens, we may have to pursue palliative care and hospice.  She understands the recommendations and she is agreeable. Ok to hold tagrisso and continue colchicine.

## 2020-08-21 NOTE — Progress Notes (Signed)
[place] CONSULT NOTE  Patient Care Team: [name], MD as PCP - General (Hematology and Oncology) Brien Mates, RN as Oncology Nurse Navigator (Oncology)  CHIEF COMPLAINTS/PURPOSE OF CONSULTATION:   Adenocarcinoma of the lung  ASSESSMENT & PLAN:   Adenocarcinoma of left lung, stage 4 [facility]) This is a very pleasant 75 year old female patient with adenocarcinoma of the left lung, EGFR exon 19 deletion, static Tagrisso in December 2021 most recently admitted with chief complaint of worsening shortness of breath.  Most recent PET scan suggested progression of disease with worsening pericardial effusion, pleural disease, questionable lymphangitic spread, worsening lymphadenopathy in the axilla, mediastinum.  She had pericardiocentesis, cytology reactive cells.  She has pleural tap, cytology positive for adenocarcinoma.  Physical examination today, palpable axillary lymph nodes, left breast skin changes but no definitive mass, left lower lobe decreased air entry, clear heart sounds, mild lower extremity edema. I recommended completing staging with repeat CT abdomen as well as MRI brain.  If she has evidence of worsening intracranial metastatic disease, we may have to proceed with radiation first followed by systemic therapy.  We have also sent the pleural fluid for repeat analysis to [facility].  If she does have any evidence of targetable mutations, we would like to try targeted therapy.  If she does not have evidence of actionable mutations, we will proceed with [place], pemetrexed and pembrolizumab at reduced dose. If she continues to deteriorate, her ECOG worsens, we may have to pursue palliative care and hospice.  She understands the recommendations and she is agreeable. Ok to hold tagrisso and continue colchicine.  Orders Placed This Encounter  Procedures   CT Abdomen Pelvis W Wo Contrast    This exam should ONLY be ordered for initial diagnosis or follow up of  known pancreatic/liver/renal/bladder masses.    Standing Status:   Future    Standing Expiration Date:   08/21/2021    Order Specific Question:   If indicated for the ordered procedure, I authorize the administration of contrast media per Radiology protocol    Answer:   Yes    Order Specific Question:   Preferred imaging location?    Answer:   [facility]    Order Specific Question:   Is Oral Contrast requested for this exam?    Answer:   Yes, Per Radiology protocol   MR Brain W Wo Contrast    Standing Status:   Future    Standing Expiration Date:   08/21/2021    Order Specific Question:   If indicated for the ordered procedure, I authorize the administration of contrast media per Radiology protocol    Answer:   Yes    Order Specific Question:   What is the patient's sedation requirement?    Answer:   No Sedation    Order Specific Question:   Does the patient have a pacemaker or implanted devices?    Answer:   No    Order Specific Question:   Use SRS Protocol?    Answer:   No    Order Specific Question:   Preferred imaging location?    Answer:   [facility] (table limit - 550 lbs)     HISTORY OF PRESENTING ILLNESS:   Sara Ferguson 75 y.o. female is here because of new diagnosis of adenocarcinoma lung.  Oncology History Overview Note  Chronology  75 year old female with history of hypertension, diabetes presented to the ED with hypoxia shortness of breath found to have large  left-sided pleural effusion as well as pericardial effusion. Patient tells me that she had SOB for about 5 months prior to presentation and she initially thought it was bronchitis. She has also noted some weight loss of about 15 lbs and attributed this to dietary changes. Cough noted, lot of phlegm , no hemoptysis. A day before presentation to the hospital, she noticed that she could not walk to the mailbox and hence went to the hospital. Patient was admitted seen by pulmonary, concern for  underlying malignancy underwent thoracentesis 11/12 and cytology came back positive for non-small cell lung cancer.  Repeat chest x-ray 11/15 showed persistent consolidation/effusion of the left lower lobe, and repeat thoracentesis performed 11/17 with 850 mL fluid removal. Patient underwent CT chest showed improved aeration of the left lung with residual left infrahilar masslike density possibly reflecting the primary bronchogenic carcinoma, grossly stable left axillary confluent mediastinal and hilar adenopathy consistent with metastatic disease, left adrenal nodule suspicious for metastasis, new small dependent right pleural effusion stable pericardial effusion. CT abdomen pelvis showed no evidence of primary intra abdominal or intrapelvic malignancy. Partial visualization of heterogeneously enhancing left lower lung opacities as well as Left pleural effusion. 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 There is a 2.4 cm LEFT adrenal nodule. Findings are concerning for metastatic disease.  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 MR brain negative for malignancy.  Foundation one EGFR exon 19 deletion. Started tagrisso Dec 2nd week 2021. Imaging from March 2022 consistent with response.   Adenocarcinoma of left lung, stage 4 (HCC)  01/13/2020 Initial Diagnosis   Adenocarcinoma of left lung, stage 4 (HCC)    01/13/2020 Cancer Staging   Staging  form: Lung, AJCC 8th Edition - Clinical stage from 01/13/2020: Stage IVB (cTX, cN3, cM1c) - Signed by Rachel Moulds, MD on 01/13/2020    01/30/2020 -  Chemotherapy   The patient had [No matching medication found in this treatment plan]   for chemotherapy treatment.      SH  She never smoked or chewed any tobacco products No occupational exposure. Worked in Engineering geologist [employer], mom died of liver cancer in 9's, sister died of breast cancer in 10's  Interim history  She was recently admitted to the hospital with SOB. She was found to have imaging concerning for recurrent pericardial effusion, left breast heterogenous density with skin thickening concerning for carcinoma, Left-greater-than-right heterogeneous interstitial and airspace opacity, concerning for pulmonary edema or infection. Left lower lobe bronchi are occluded., axillary and metastatic mediastinal LN Cytology from pericardial fluid confirmed adenocarcinoma. She is here for FU with her nephew. She is in a wheel chair, oxygen dependent.  Using 3 L of oxygen 24 x 7.  She is currently in rehab, according to niece, she has been needing assistance with daily activities as well.  Besides shortness of breath, she reports some breast changes in the left breast.  Some mild lower extremity swelling which has not been very bothersome.  Rest of the pertinent 10 point ROS reviewed and negative.  MEDICAL HISTORY:  Past Medical History:  Diagnosis Date  Arthritis    Diabetes mellitus without complication ([place])    Hypertension    Non-small cell lung cancer ([place])    Dx 12/30/19 by thoracentesis     SURGICAL HISTORY: Past Surgical History:  Procedure Laterality Date   ABDOMINAL HYSTERECTOMY     ARTERIAL LINE INSERTION N/A 08/07/2020   Procedure: ARTERIAL LINE INSERTION;  Surgeon: Dixie Dials, MD;  Location: [facility];  Service: Cardiovascular;  Laterality: N/A;   ECTOPIC PREGNANCY SURGERY     PERICARDIOCENTESIS N/A 08/07/2020    Procedure: PERICARDIOCENTESIS;  Surgeon: Dixie Dials, MD;  Location: [facility];  Service: Cardiovascular;  Laterality: N/A;    SOCIAL HISTORY: Social History   Socioeconomic History   Marital status: Widowed    Spouse name: Not on file   Number of children: Not on file   Years of education: Not on file   Highest education level: Not on file  Occupational History   Not on file  Tobacco Use   Smoking status: Never   Smokeless tobacco: Never  Substance and Sexual Activity   Alcohol use: Not Currently   Drug use: Never   Sexual activity: Not on file  Other Topics Concern   Not on file  Social History Narrative   Not on file   Social Determinants of Health   Financial Resource Strain: Low Risk    Difficulty of Paying Living Expenses: Not hard at all  Food Insecurity: No Food Insecurity   Worried About Running Out of Food in the Last Year: Never true   [place] in the Last Year: Never true  Transportation Needs: No Transportation Needs   Lack of Transportation (Medical): No   Lack of Transportation (Non-Medical): No  Physical Activity: Inactive   Days of Exercise per Week: 0 days   Minutes of Exercise per Session: 0 min  Stress: No Stress Concern Present   Feeling of Stress : Not at all  Social Connections: Socially Isolated   Frequency of Communication with Friends and Family: Once a week   Frequency of Social Gatherings with Friends and Family: Once a week   Attends Religious Services: Never   Marine scientist or Organizations: No   Attends Archivist Meetings: 1 to 4 times per year   Marital Status: Widowed  Human resources officer Violence: Not At Risk   Fear of Current or Ex-Partner: No   Emotionally Abused: No   Physically Abused: No   Sexually Abused: No    FAMILY HISTORY: No family history on file.  ALLERGIES:  has No Known Allergies.  MEDICATIONS:  Current Outpatient Medications  Medication Sig Dispense Refill   colchicine  0.6 MG tablet Take 1 tablet (0.6 mg total) by mouth 2 (two) times daily. 90 tablet 1   guaifenesin (ROBITUSSIN) 100 MG/5ML syrup Take 200 mg by mouth 3 (three) times daily as needed for cough.     hydrocortisone cream 1 % Apply 1 application topically daily as needed for itching (rash).     metoprolol tartrate (LOPRESSOR) 50 MG tablet Take 25 mg by mouth 2 (two) times daily.     albuterol (VENTOLIN HFA) 108 (90 Base) MCG/ACT inhaler Inhale 2 puffs into the lungs every 6 (six) hours as needed for wheezing or shortness of breath. 1 each 1   amLODipine (NORVASC) 5 MG tablet TAKE 1 TABLET (5 MG TOTAL) BY MOUTH DAILY. 30 tablet 1   No current facility-administered medications for this visit.  PHYSICAL EXAMINATION: ECOG PERFORMANCE STATUS: 2 - Symptomatic, <50% confined to bed  Vitals:   08/21/20 1228  BP: (!) 167/88  Pulse: 82  Resp: 18  Temp: 98.2 F (36.8 C)  SpO2: 98%   Filed Weights   08/21/20 1228  Weight: 134 lb 1.6 oz (60.8 kg)   Physical Exam Constitutional:      General: She is not in acute distress.    Appearance: Normal appearance. She is not toxic-appearing.  HENT:     Head: Normocephalic and atraumatic.     Mouth/Throat:     Mouth: Mucous membranes are moist.  Cardiovascular:     Rate and Rhythm: Normal rate and regular rhythm.     Pulses: Normal pulses.     Heart sounds: Normal heart sounds.  Pulmonary:     Comments: Decreased air entry LLL.Scattered crackles Chest:     Comments: Left breast skin redness in upper inner quadrant. Palpable axillary LN. Abdominal:     General: Abdomen is flat.     Palpations: Abdomen is soft.  Musculoskeletal:        General: No swelling (BLE swelling improved). Normal range of motion.     Right lower leg: Edema ([place], could be related to medication) present.     Left lower leg: Edema ([place]) present.  Lymphadenopathy:     Cervical: No cervical adenopathy.  Skin:    General: Skin is warm and dry.  Neurological:      General: No focal deficit present.     Mental Status: She is alert and oriented to person, place, and time.  Psychiatric:        Mood and Affect: Mood normal.        Behavior: Behavior normal.     LABORATORY DATA:  I have reviewed the data as listed Lab Results  Component Value Date   WBC 7.8 08/21/2020   HGB 11.5 (L) 08/21/2020   HCT 35.2 (L) 08/21/2020   MCV 87.1 08/21/2020   PLT 426 (H) 08/21/2020     Chemistry      Component Value Date/Time   NA 136 08/21/2020 1148   K 4.2 08/21/2020 1148   CL 99 08/21/2020 1148   CO2 27 08/21/2020 1148   BUN 12 08/21/2020 1148   CREATININE 0.70 08/21/2020 1148   CREATININE 0.79 04/26/2020 1513      Component Value Date/Time   CALCIUM 9.3 08/21/2020 1148   ALKPHOS 130 (H) 08/21/2020 1148   AST 54 (H) 08/21/2020 1148   AST 24 04/26/2020 1513   ALT 51 (H) 08/21/2020 1148   ALT 23 04/26/2020 1513   BILITOT 0.7 08/21/2020 1148   BILITOT 0.4 04/26/2020 1513     RADIOGRAPHIC STUDIES: I have personally reviewed the radiological images as listed and agreed with the findings in the report. DG Chest 1 View  Result Date: 08/11/2020 CLINICAL DATA:  Shortness of breath.  Thoracentesis EXAM: CHEST  1 VIEW COMPARISON:  Yesterday FINDINGS: Negative for pneumothorax. Continued extensive opacification of the left chest from pulmonary opacity and small loculated pleural effusion by CT. Cardiopericardial enlargement with large pericardial effusion by CT. Improved aeration on the right, although persistent perihilar infiltrate. IMPRESSION: 1. No acute finding related to thoracentesis. 2. No detected change from yesterday other than improved lung volumes. Electronically Signed   By: Monte Fantasia M.D.   On: 08/11/2020 10:29   CT Chest Wo Contrast  Result Date: 08/05/2020 CLINICAL DATA:  Shortness of breath EXAM: CT CHEST WITHOUT CONTRAST TECHNIQUE: Multidetector  CT imaging of the chest was performed following the standard protocol without IV contrast.  COMPARISON:  04/19/2020 FINDINGS: Cardiovascular: Large pericardial effusion measuring approximately 2.1 cm in thickness. Calcific aortic atherosclerosis. Mediastinum/Nodes: Increased density within the upper mediastinum, likely lymphadenopathy. There is left greater than right axillary adenopathy. Asymmetric density within the left breast. There is also left breast skin thickening. Lungs/Pleura: Left-greater-than-right heterogeneous interstitial and airspace opacity. Peribronchial thickening. Left lower lobe bronchi are occluded. There is complete complex of the left lower lobe. Upper Abdomen: 16 mm left adrenal mass. Musculoskeletal: Sclerotic lesions of the left second,, right third and left seventh ribs. Numerous sclerotic lesions within the vertebral bodies, greatest at T3. IMPRESSION: 1. Left breast heterogeneous density with skin thickening is concerning for carcinoma the skin thickening could be due to edema. Correlation with mammography is recommended. 2. Large pericardial effusion, measuring up to 2.1 cm in thickness, progressed. 3. Left-greater-than-right heterogeneous interstitial and airspace opacity, concerning for pulmonary edema or infection. Left lower lobe bronchi are occluded. Reportedly the patient has a history of non-small cell lung carcinoma, but no distinct mass is visualized on this noncontrast study. Aeration of both lungs is much worse than on 04/19/2020. 4. Left greater than right axillary and mediastinal lymphadenopathy, likely metastatic. 5. Sclerotic lesions of the left second, right third and left seventh ribs, consistent with metastatic disease. 6. 16 mm left adrenal mass, concerning for metastatic disease. Aortic Atherosclerosis (ICD10-I70.0). Electronically Signed   By: Ulyses Jarred M.D.   On: 08/05/2020 20:53   CARDIAC CATHETERIZATION  Result Date: 08/07/2020 Monitor patient closely in CCU under CCM guidance/Management Continue pericardial drain for 2-3 days till effusion is  less than 30 ml per day.  DG Chest Port 1 View  Result Date: 08/10/2020 CLINICAL DATA:  Shortness of breath.  Extubation. EXAM: PORTABLE CHEST 1 VIEW COMPARISON:  08/09/2020 FINDINGS: Endotracheal tube and nasogastric tube have been removed. Again noted is marked consolidation and opacification throughout the left hemithorax with minimal aeration near the left lung apex. Left lung aeration has slightly decreased. Markedly decreased aeration in the right lung with increased patchy interstitial densities in the right lung. Cardiac silhouette is obscured by the lung densities. Negative for pneumothorax. Again noted is a small bore catheter overlying the left side of the heart. IMPRESSION: 1. Decreased aeration in both lungs following removal of the endotracheal tube. Difficult to exclude worsening edema or interstitial lung densities. 2. Stable position of the small bore tube overlying the left side of the chest. Electronically Signed   By: Markus Daft M.D.   On: 08/10/2020 09:02   DG CHEST PORT 1 VIEW  Result Date: 08/09/2020 CLINICAL DATA:  Intubation.  Respiratory failure. EXAM: PORTABLE CHEST 1 VIEW COMPARISON:  08/07/2020.  CT 08/05/2020. FINDINGS: Endotracheal tube, NG tube in stable position. Cardiomegaly again noted. Bilateral pulmonary infiltrates/edema, improved from prior exam. Large left pleural effusion. No pneumothorax. Degenerative changes scoliosis thoracic spine. IMPRESSION: 1.  Endotracheal tube and NG tube in stable position. 2. Cardiomegaly. Bilateral pulmonary infiltrates/edema, improved from prior exam. Large left pleural effusion. Electronically Signed   By: Marcello Moores  Register   On: 08/09/2020 07:19   DG CHEST PORT 1 VIEW  Result Date: 08/07/2020 CLINICAL DATA:  75 year old female status post intubation. EXAM: PORTABLE CHEST 1 VIEW COMPARISON:  Chest radiograph dated 08/05/2020 and CT dated 08/05/2020. FINDINGS: Endotracheal tube with tip approximately 17 mm above the carina. Significant  interval progression of bilateral pulmonary opacities compared to the prior radiograph likely representing worsening edema/CHF  or pneumonia. Probable small bilateral pleural effusions. No pneumothorax. The cardiac borders are silhouetted. Atherosclerotic calcification of the aorta. No acute osseous pathology. Osteopenia. IMPRESSION: 1. Endotracheal tube above the carina. 2. Significant interval progression of bilateral pulmonary opacities likely representing worsening edema/CHF or pneumonia. Electronically Signed   By: Anner Crete M.D.   On: 08/07/2020 19:31   DG Chest Port 1 View  Result Date: 08/05/2020 CLINICAL DATA:  Cough and shortness of breath. EXAM: PORTABLE CHEST 1 VIEW COMPARISON:  February 23, 2020 FINDINGS: Markedly enlarged and globular cardiac silhouette. Calcific atherosclerotic disease of the aorta. Probable left pleural effusion. Left lower lobe airspace disease versus atelectasis. Bilateral interstitial opacities with central predominance. Osseous structures are without acute abnormality. Soft tissues are grossly normal. IMPRESSION: 1. Markedly enlarged and globular cardiac silhouette. Cardiomyopathy versus enlarging pericardial effusion. 2. Probable left pleural effusion with left lower lobe airspace disease versus atelectasis. 3. Bilateral interstitial opacities with central predominance may represent interstitial pulmonary edema. Electronically Signed   By: Fidela Salisbury M.D.   On: 08/05/2020 17:46   DG Abd Portable 1V  Result Date: 08/08/2020 CLINICAL DATA:  OG tube placement EXAM: PORTABLE ABDOMEN - 1 VIEW COMPARISON:  CT 04/19/2020, chest x-ray 08/07/2020 FINDINGS: Esophageal tube tip and side-port presumably project over the gastric body. Mild diffuse air-filled bowel without definitive obstructive pattern. Catheter tubing projects over the left lower chest central abdomen and pelvis. Sclerotic lesion within the left anterior iliac spine and left pubic symphysis. IMPRESSION:  1. Esophageal tube tip and side port overlie the gastric body 2. Mild diffuse increased bowel gas without definitive obstructive pattern 3. Osseous sclerotic metastatic disease. Electronically Signed   By: Donavan Foil M.D.   On: 08/08/2020 16:13   ECHOCARDIOGRAM COMPLETE  Result Date: 08/06/2020    ECHOCARDIOGRAM REPORT   Patient Name:   Sara Ferguson Date of Exam: 08/06/2020 Medical Rec #:  [id number]      Height:       64.0 in Accession #:    [id number]     Weight:       142.9 lb Date of Birth:  1945/07/14      BSA:          1.696 m Patient Age:    84 years       BP:           164/72 mmHg Patient Gender: F              HR:           101 bpm. Exam Location:  Inpatient Procedure: 2D Echo, Cardiac Doppler and Color Doppler Indications:     I31.3 Pericardial effusion  History:         Patient has prior history of Echocardiogram examinations, most                  recent 01/26/2020. Risk Factors:Hypertension and Diabetes.                  Cancer.  Sonographer:     Jonelle Sidle Dance Referring Phys:  [id number] Elmarie Shiley Diagnosing Phys: Dixie Dials MD IMPRESSIONS  1. Left ventricular ejection fraction, by estimation, is 60 to 65%. The left ventricle has normal function. The left ventricle has no regional wall motion abnormalities. Left ventricular diastolic parameters are consistent with Grade I diastolic dysfunction (impaired relaxation).  2. Right ventricular systolic function is normal. The right ventricular size is normal.  3. Moderate pericardial effusion. The pericardial effusion is circumferential.  There is no evidence of cardiac tamponade.  4. The mitral valve is degenerative. Mild mitral valve regurgitation.  5. The aortic valve is tricuspid. There is mild calcification of the aortic valve. There is mild thickening of the aortic valve. Aortic valve regurgitation is not visualized. Mild aortic valve sclerosis is present, with no evidence of aortic valve stenosis.  6. The inferior vena cava is dilated in size  with <50% respiratory variability, suggesting right atrial pressure of 15 mmHg. 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 FINDINGS  Left Ventricle: Left ventricular ejection fraction, by estimation, is 60 to 65%. The left ventricle has normal function. The left ventricle has no regional wall motion abnormalities. The left ventricular internal cavity size was normal in size. There is  borderline concentric left ventricular hypertrophy. Left ventricular diastolic parameters are consistent with Grade I diastolic dysfunction (impaired relaxation). Right Ventricle: The right ventricular size is normal. No increase in right ventricular wall thickness. Right ventricular systolic function is normal. Left Atrium: Left atrial size was normal in size. Right Atrium: Right atrial size was normal in size. Pericardium: A moderately sized pericardial effusion is present. The pericardial effusion is circumferential. The pericardial effusion appears to contain mixed echogenic material. There is no evidence of cardiac tamponade. Mitral Valve: The mitral valve is degenerative in appearance. There is mild thickening of the mitral valve leaflet(s). There is mild calcification of the mitral valve leaflet(s). Mild mitral annular calcification. Mild mitral valve regurgitation. Tricuspid Valve: The tricuspid valve is normal in structure. Tricuspid valve regurgitation is trivial. Aortic Valve: The aortic valve is tricuspid. There is mild calcification of the aortic valve. There is mild thickening of the aortic valve. Aortic valve regurgitation is not visualized. Mild aortic valve sclerosis is present, with no evidence of aortic valve stenosis. Pulmonic Valve: The pulmonic valve was normal in structure. Pulmonic valve regurgitation is not visualized. Aorta: The aortic root is normal in size and structure. There is minimal (Grade I) atheroma plaque involving the ascending aorta. Venous: The  inferior vena cava is dilated in size with less than 50% respiratory variability, suggesting right atrial pressure of 15 mmHg. IAS/Shunts: The atrial septum is grossly normal.  LEFT VENTRICLE PLAX 2D LVIDd:         3.70 cm  Diastology LVIDs:         2.10 cm  LV e' medial:    4.46 cm/s LV PW:         1.20 cm  LV E/e' medial:  17.7 LV IVS:        1.10 cm  LV e' lateral:   4.68 cm/s LVOT diam:     1.90 cm  LV E/e' lateral: 16.9 LV SV:         52 LV SV Index:   31 LVOT Area:     2.84 cm  RIGHT VENTRICLE             IVC RV Basal diam:  2.50 cm     IVC diam: 2.20 cm RV S prime:     10.40 cm/s TAPSE (M-mode): 1.8 cm LEFT ATRIUM             Index       RIGHT ATRIUM          Index LA diam:        4.20 cm 2.48 cm/m  RA Area:     9.54 cm LA Vol (A2C):   66.9 ml 39.45 ml/m RA Volume:   19.60  ml 11.56 ml/m LA Vol (A4C):   25.8 ml 15.21 ml/m LA Biplane Vol: 42.6 ml 25.12 ml/m  AORTIC VALVE LVOT Vmax:   97.00 cm/s LVOT Vmean:  66.700 cm/s LVOT VTI:    0.185 m  AORTA Ao Root diam: 2.80 cm Ao Asc diam:  2.70 cm MITRAL VALVE MV Area (PHT): 5.13 cm     SHUNTS MV Decel Time: 148 msec     Systemic VTI:  0.18 m MV E velocity: 79.00 cm/s   Systemic Diam: 1.90 cm MV A velocity: 138.00 cm/s MV E/A ratio:  0.57 Dixie Dials MD Electronically signed by Dixie Dials MD Signature Date/Time: 08/06/2020/12:02:39 PM    Final    ECHOCARDIOGRAM LIMITED  Result Date: 08/15/2020    ECHOCARDIOGRAM LIMITED REPORT   Patient Name:   Sara Ferguson Date of Exam: 08/15/2020 Medical Rec #:  [id number]      Height:       64.0 in Accession #:    [id number]     Weight:       134.9 lb Date of Birth:  04-05-1945      BSA:          1.655 m Patient Age:    61 years       BP:           135/55 mmHg Patient Gender: F              HR:           88 bpm. Exam Location:  Inpatient Procedure: Limited Echo and Limited Color Doppler Indications:    I31.3 Pericardial effusion (noninflammatory)  History:        Patient has prior history of Echocardiogram examinations,  most                 recent 08/10/2020. Signs/Symptoms:Dyspnea; Risk                 Factors:Hypertension and Dyslipidemia. Cancer. Pericarditis.  Sonographer:    Jonelle Sidle Dance Referring Phys: [place]  1. Left ventricular ejection fraction, by estimation, is 55 to 60%. The left ventricle has normal function. The left ventricle has no regional wall motion abnormalities. There is mild concentric left ventricular hypertrophy of the anterior, septal and posterior segments. Left ventricular diastolic function could not be evaluated.  2. Right ventricular systolic function is normal. The right ventricular size is normal.  3. Left atrial size was moderately dilated.  4. A small pericardial effusion is present. The pericardial effusion is circumferential. There is no evidence of cardiac tamponade.  5. The mitral valve is degenerative. Mild mitral valve regurgitation.  6. The aortic valve is tricuspid. There is mild calcification of the aortic valve. There is mild thickening of the aortic valve. Aortic valve regurgitation is not visualized. Mild aortic valve sclerosis is present, with no evidence of aortic valve stenosis.  7. There is mild (Grade II) atheroma plaque involving the ascending aorta.  8. The inferior vena cava is dilated in size with >50% respiratory variability, suggesting right atrial pressure of 8 mmHg. FINDINGS  Left Ventricle: Left ventricular ejection fraction, by estimation, is 55 to 60%. The left ventricle has normal function. The left ventricle has no regional wall motion abnormalities. The left ventricular internal cavity size was normal in size. There is  mild concentric left ventricular hypertrophy of the anterior, septal and posterior segments. Left ventricular diastolic function could not be evaluated. Right Ventricle: The right ventricular size is normal. No increase  in right ventricular wall thickness. Right ventricular systolic function is normal. Left Atrium: Left atrial  size was moderately dilated. Right Atrium: Right atrial size was normal in size. Pericardium: A small pericardial effusion is present. The pericardial effusion is circumferential. The pericardial effusion appears to contain fibrous material. There is no evidence of cardiac tamponade. Mitral Valve: The mitral valve is degenerative in appearance. Mild mitral valve regurgitation. Tricuspid Valve: The tricuspid valve is normal in structure. Tricuspid valve regurgitation is mild. Aortic Valve: The aortic valve is tricuspid. There is mild calcification of the aortic valve. There is mild thickening of the aortic valve. There is mild aortic valve annular calcification. Aortic valve regurgitation is not visualized. Mild aortic valve sclerosis is present, with no evidence of aortic valve stenosis. Pulmonic Valve: The pulmonic valve was normal in structure. Pulmonic valve regurgitation is not visualized. Aorta: The aortic root is normal in size and structure. There is mild (Grade II) atheroma plaque involving the ascending aorta. Venous: The inferior vena cava is dilated in size with greater than 50% respiratory variability, suggesting right atrial pressure of 8 mmHg. IAS/Shunts: The interatrial septum was not assessed. Additional Comments: There is a small pleural effusion in both left and right lateral regions. LEFT VENTRICLE PLAX 2D LVIDd:         3.00 cm LVIDs:         2.20 cm LV PW:         1.30 cm LV IVS:        1.20 cm LVOT diam:     1.50 cm LVOT Area:     1.77 cm  IVC IVC diam: 2.10 cm LEFT ATRIUM         Index LA diam:    3.70 cm 2.24 cm/m   AORTA Ao Root diam: 3.00 cm Ao Asc diam:  2.50 cm  SHUNTS Systemic Diam: 1.50 cm Dixie Dials MD Electronically signed by Dixie Dials MD Signature Date/Time: 08/15/2020/4:58:15 PM    Final    ECHOCARDIOGRAM LIMITED  Result Date: 08/10/2020    ECHOCARDIOGRAM LIMITED REPORT   Patient Name:   Sara Ferguson Date of Exam: 08/10/2020 Medical Rec #:  [id number]      Height:        64.0 in Accession #:    [id number]     Weight:       138.4 lb Date of Birth:  August 28, 1945      BSA:          1.673 m Patient Age:    38 years       BP:           129/56 mmHg Patient Gender: F              HR:           89 bpm. Exam Location:  Inpatient Procedure: 2D Echo, Color Doppler and Cardiac Doppler Indications:     Pericardial effusion  History:         Patient has prior history of Echocardiogram examinations, most                  recent 08/08/2020. Signs/Symptoms:Shortness of Breath. Cancer.                  Pericarditis.  Sonographer:     Clayton Lefort RDCS (AE) Referring Phys:  [place] Diagnosing Phys: Dixie Dials MD IMPRESSIONS  1. Left ventricular ejection fraction, by estimation, is 60 to 65%. The left ventricle has normal  function. The left ventricle has no regional wall motion abnormalities. There is moderate left ventricular hypertrophy. Left ventricular diastolic function  could not be evaluated.  2. Right ventricular systolic function is normal. The right ventricular size is normal.  3. Left atrial size was moderately dilated.  4. Right atrial size was moderately dilated.  5. A small pericardial effusion is present. The pericardial effusion is localized near the right ventricle, localized near the right atrium and surrounding the apex. There is no evidence of cardiac tamponade.  6. The mitral valve is degenerative. Mild mitral valve regurgitation.  7. The aortic valve is tricuspid. There is mild calcification of the aortic valve. There is mild thickening of the aortic valve. Mild to moderate aortic valve sclerosis/calcification is present, without any evidence of aortic stenosis.  8. There is mild (Grade II) atheroma plaque involving the aortic root.  9. The inferior vena cava is normal in size with <50% respiratory variability, suggesting right atrial pressure of 8 mmHg. FINDINGS  Left Ventricle: Left ventricular ejection fraction, by estimation, is 60 to 65%. The left ventricle has normal  function. The left ventricle has no regional wall motion abnormalities. There is moderate left ventricular hypertrophy. Left ventricular diastolic function could not be evaluated. Right Ventricle: The right ventricular size is normal. No increase in right ventricular wall thickness. Right ventricular systolic function is normal. Left Atrium: Left atrial size was moderately dilated. Right Atrium: Right atrial size was moderately dilated. Pericardium: A small pericardial effusion is present. The pericardial effusion is localized near the right ventricle, localized near the right atrium and surrounding the apex. There is no evidence of cardiac tamponade. Mitral Valve: The mitral valve is degenerative in appearance. There is mild thickening of the mitral valve leaflet(s). There is mild calcification of the mitral valve leaflet(s). Mild mitral annular calcification. Mild mitral valve regurgitation. Tricuspid Valve: The tricuspid valve is normal in structure. Tricuspid valve regurgitation is mild. Aortic Valve: The aortic valve is tricuspid. There is mild calcification of the aortic valve. There is mild thickening of the aortic valve. There is mild aortic valve annular calcification. Mild to moderate aortic valve sclerosis/calcification is present, without any evidence of aortic stenosis. Pulmonic Valve: The pulmonic valve was normal in structure. Aorta: The aortic root is normal in size and structure. There is mild (Grade II) atheroma plaque involving the aortic root. Venous: The inferior vena cava is normal in size with less than 50% respiratory variability, suggesting right atrial pressure of 8 mmHg. IAS/Shunts: The interatrial septum was not assessed. Dixie Dials MD Electronically signed by Dixie Dials MD Signature Date/Time: 08/10/2020/1:26:34 PM    Final    ECHOCARDIOGRAM LIMITED  Result Date: 08/08/2020    ECHOCARDIOGRAM LIMITED REPORT   Patient Name:   Sara Ferguson Date of Exam: 08/08/2020 Medical Rec #:   [id number]      Height:       64.0 in Accession #:    [id number]     Weight:       133.3 lb Date of Birth:  Dec 12, 1945      BSA:          1.647 m Patient Age:    40 years       BP:           116/47 mmHg Patient Gender: F              HR:           85 bpm. Exam Location:  Inpatient Procedure:  Limited Echo Indications:     Pericardial effusion post pericardial centesis  History:         Patient has prior history of Echocardiogram examinations, most                  recent 08/06/2020. Signs/Symptoms:Shortness of Breath. Cancer.                  Pericarditis.  Sonographer:     [name] RDCS Referring Phys:  [place] Diagnosing Phys: Dixie Dials MD IMPRESSIONS  1. Left ventricular ejection fraction, by estimation, is 65 to 70%. The left ventricle has normal function. The left ventricle has no regional wall motion abnormalities. There is mild concentric left ventricular hypertrophy.  2. Right ventricular systolic function is normal.  3. Left atrial size was mildly dilated.  4. Right atrial size was mildly dilated.  5. Sucessful pericardiocentesis.  6. The mitral valve is degenerative.  7. The aortic valve is tricuspid. There is mild calcification of the aortic valve. There is mild thickening of the aortic valve.  8. There is mild (Grade II) atheroma plaque involving the ascending aorta. FINDINGS  Left Ventricle: Left ventricular ejection fraction, by estimation, is 65 to 70%. The left ventricle has normal function. The left ventricle has no regional wall motion abnormalities. There is mild concentric left ventricular hypertrophy. Right Ventricle: Right ventricular systolic function is normal. Left Atrium: Left atrial size was mildly dilated. Right Atrium: Right atrial size was mildly dilated. Pericardium: Sucessful pericardiocentesis. There is no evidence of pericardial effusion. Mitral Valve: The mitral valve is degenerative in appearance. Tricuspid Valve: The tricuspid valve is normal in structure. Aortic  Valve: The aortic valve is tricuspid. There is mild calcification of the aortic valve. There is mild thickening of the aortic valve. There is mild aortic valve annular calcification. Pulmonic Valve: The pulmonic valve was normal in structure. Aorta: The aortic root is normal in size and structure. There is mild (Grade II) atheroma plaque involving the ascending aorta. Venous: The inferior vena cava was not well visualized. Dixie Dials MD Electronically signed by Dixie Dials MD Signature Date/Time: 08/08/2020/3:22:53 PM    Final         [name], MD 08/21/2020 2:37 PM

## 2020-08-21 NOTE — Progress Notes (Signed)
I received a message from Dr. [name] regarding Sara Ferguson's [facility] results.  I called [facility] and was updated there was confusion about which specimen to test. I updated them and they will run CDx test. I then updated Dr. [name].

## 2020-08-23 ENCOUNTER — Encounter (HOSPITAL_COMMUNITY): Payer: Self-pay

## 2020-08-27 ENCOUNTER — Other Ambulatory Visit: Payer: Self-pay

## 2020-08-27 ENCOUNTER — Other Ambulatory Visit: Payer: Self-pay | Admitting: Hematology and Oncology

## 2020-08-27 ENCOUNTER — Ambulatory Visit (HOSPITAL_COMMUNITY)
Admission: RE | Admit: 2020-08-27 | Discharge: 2020-08-27 | Disposition: A | Discharge status: Home / Self Care | Payer: Medicare Other | Source: Ambulatory Visit | Attending: Hematology and Oncology | Admitting: Hematology and Oncology

## 2020-08-27 ENCOUNTER — Emergency Department (HOSPITAL_COMMUNITY): Payer: Medicare Other

## 2020-08-27 ENCOUNTER — Inpatient Hospital Stay (HOSPITAL_COMMUNITY)
Admission: EM | Admit: 2020-08-27 | Discharge: 2020-09-17 | DRG: 180 | Disposition: E | Payer: Medicare Other | Attending: Pulmonary Disease | Admitting: Pulmonary Disease

## 2020-08-27 ENCOUNTER — Encounter (HOSPITAL_COMMUNITY): Payer: Self-pay

## 2020-08-27 DIAGNOSIS — J90 Pleural effusion, not elsewhere classified: Secondary | ICD-10-CM | POA: Diagnosis present

## 2020-08-27 DIAGNOSIS — R54 Age-related physical debility: Secondary | ICD-10-CM | POA: Diagnosis present

## 2020-08-27 DIAGNOSIS — Z7189 Other specified counseling: Secondary | ICD-10-CM

## 2020-08-27 DIAGNOSIS — C3490 Malignant neoplasm of unspecified part of unspecified bronchus or lung: Secondary | ICD-10-CM | POA: Insufficient documentation

## 2020-08-27 DIAGNOSIS — J910 Malignant pleural effusion: Secondary | ICD-10-CM | POA: Diagnosis present

## 2020-08-27 DIAGNOSIS — C7970 Secondary malignant neoplasm of unspecified adrenal gland: Secondary | ICD-10-CM | POA: Diagnosis present

## 2020-08-27 DIAGNOSIS — J9621 Acute and chronic respiratory failure with hypoxia: Secondary | ICD-10-CM | POA: Diagnosis present

## 2020-08-27 DIAGNOSIS — C3492 Malignant neoplasm of unspecified part of left bronchus or lung: Principal | ICD-10-CM | POA: Diagnosis present

## 2020-08-27 DIAGNOSIS — I313 Pericardial effusion (noninflammatory): Secondary | ICD-10-CM | POA: Diagnosis present

## 2020-08-27 DIAGNOSIS — Z66 Do not resuscitate: Secondary | ICD-10-CM | POA: Diagnosis present

## 2020-08-27 DIAGNOSIS — I3139 Other pericardial effusion (noninflammatory): Secondary | ICD-10-CM | POA: Diagnosis present

## 2020-08-27 DIAGNOSIS — E119 Type 2 diabetes mellitus without complications: Secondary | ICD-10-CM

## 2020-08-27 DIAGNOSIS — J9601 Acute respiratory failure with hypoxia: Secondary | ICD-10-CM

## 2020-08-27 DIAGNOSIS — E872 Acidosis: Secondary | ICD-10-CM | POA: Diagnosis present

## 2020-08-27 DIAGNOSIS — I5031 Acute diastolic (congestive) heart failure: Secondary | ICD-10-CM | POA: Diagnosis present

## 2020-08-27 DIAGNOSIS — J811 Chronic pulmonary edema: Secondary | ICD-10-CM | POA: Diagnosis present

## 2020-08-27 DIAGNOSIS — Z20822 Contact with and (suspected) exposure to covid-19: Secondary | ICD-10-CM | POA: Diagnosis present

## 2020-08-27 DIAGNOSIS — R7989 Other specified abnormal findings of blood chemistry: Secondary | ICD-10-CM | POA: Diagnosis present

## 2020-08-27 DIAGNOSIS — Z9981 Dependence on supplemental oxygen: Secondary | ICD-10-CM

## 2020-08-27 DIAGNOSIS — J9622 Acute and chronic respiratory failure with hypercapnia: Secondary | ICD-10-CM | POA: Diagnosis present

## 2020-08-27 DIAGNOSIS — R55 Syncope and collapse: Secondary | ICD-10-CM | POA: Diagnosis not present

## 2020-08-27 DIAGNOSIS — Z515 Encounter for palliative care: Secondary | ICD-10-CM

## 2020-08-27 DIAGNOSIS — Z681 Body mass index (BMI) 19 or less, adult: Secondary | ICD-10-CM

## 2020-08-27 DIAGNOSIS — E876 Hypokalemia: Secondary | ICD-10-CM | POA: Diagnosis not present

## 2020-08-27 DIAGNOSIS — R0602 Shortness of breath: Secondary | ICD-10-CM

## 2020-08-27 DIAGNOSIS — Z79899 Other long term (current) drug therapy: Secondary | ICD-10-CM

## 2020-08-27 DIAGNOSIS — C7951 Secondary malignant neoplasm of bone: Secondary | ICD-10-CM | POA: Diagnosis present

## 2020-08-27 DIAGNOSIS — M1990 Unspecified osteoarthritis, unspecified site: Secondary | ICD-10-CM | POA: Diagnosis present

## 2020-08-27 DIAGNOSIS — Z4682 Encounter for fitting and adjustment of non-vascular catheter: Secondary | ICD-10-CM

## 2020-08-27 DIAGNOSIS — E43 Unspecified severe protein-calorie malnutrition: Secondary | ICD-10-CM | POA: Diagnosis present

## 2020-08-27 DIAGNOSIS — J9602 Acute respiratory failure with hypercapnia: Secondary | ICD-10-CM | POA: Diagnosis present

## 2020-08-27 DIAGNOSIS — I110 Hypertensive heart disease with heart failure: Secondary | ICD-10-CM | POA: Diagnosis present

## 2020-08-27 LAB — BLOOD GAS, VENOUS
Acid-base deficit: 0.5 mmol/L (ref 0.0–2.0)
Bicarbonate: 29.5 mmol/L — ABNORMAL HIGH (ref 20.0–28.0)
O2 Saturation: 94.2 %
Patient temperature: 98.6
pCO2, Ven: 80.8 mmHg (ref 44.0–60.0)
pH, Ven: 7.188 — CL (ref 7.250–7.430)
pO2, Ven: 88.5 mmHg — ABNORMAL HIGH (ref 32.0–45.0)

## 2020-08-27 LAB — CBC WITH DIFFERENTIAL/PLATELET
Abs Immature Granulocytes: 0.09 10*3/uL — ABNORMAL HIGH (ref 0.00–0.07)
Basophils Absolute: 0 10*3/uL (ref 0.0–0.1)
Basophils Relative: 0 %
Eosinophils Absolute: 0 10*3/uL (ref 0.0–0.5)
Eosinophils Relative: 0 %
HCT: 39.4 % (ref 36.0–46.0)
Hemoglobin: 12 g/dL (ref 12.0–15.0)
Immature Granulocytes: 1 %
Lymphocytes Relative: 6 %
Lymphs Abs: 0.6 10*3/uL — ABNORMAL LOW (ref 0.7–4.0)
MCH: 28.2 pg (ref 26.0–34.0)
MCHC: 30.5 g/dL (ref 30.0–36.0)
MCV: 92.5 fL (ref 80.0–100.0)
Monocytes Absolute: 0.7 10*3/uL (ref 0.1–1.0)
Monocytes Relative: 6 %
Neutro Abs: 9.8 10*3/uL — ABNORMAL HIGH (ref 1.7–7.7)
Neutrophils Relative %: 87 %
Platelets: 389 10*3/uL (ref 150–400)
RBC: 4.26 MIL/uL (ref 3.87–5.11)
RDW: 13 % (ref 11.5–15.5)
WBC: 11.2 10*3/uL — ABNORMAL HIGH (ref 4.0–10.5)
nRBC: 0 % (ref 0.0–0.2)

## 2020-08-27 LAB — TROPONIN I (HIGH SENSITIVITY): Troponin I (High Sensitivity): 13 ng/L (ref <18)

## 2020-08-27 LAB — BASIC METABOLIC PANEL
Anion gap: 12 (ref 5–15)
BUN: 16 mg/dL (ref 8–23)
CO2: 30 mmol/L (ref 22–32)
Calcium: 9.4 mg/dL (ref 8.9–10.3)
Chloride: 99 mmol/L (ref 98–111)
Creatinine, Ser: 0.71 mg/dL (ref 0.44–1.00)
GFR, Estimated: >60 mL/min (ref >60)
Glucose, Bld: 208 mg/dL — ABNORMAL HIGH (ref 70–99)
Potassium: 3.5 mmol/L (ref 3.5–5.1)
Sodium: 141 mmol/L (ref 135–145)

## 2020-08-27 LAB — RESP PANEL BY RT-PCR (FLU A&B, COVID) ARPGX2
Influenza A by PCR: NEGATIVE
Influenza B by PCR: NEGATIVE
SARS Coronavirus 2 by RT PCR: NEGATIVE

## 2020-08-27 LAB — BRAIN NATRIURETIC PEPTIDE: B Natriuretic Peptide: 201.1 pg/mL — ABNORMAL HIGH (ref 0.0–100.0)

## 2020-08-27 MED ORDER — IOHEXOL 350 MG/ML SOLN
80.0000 mL | Freq: Once | INTRAVENOUS | Status: AC | PRN
  Administered 2020-08-27: 80 mL via INTRAVENOUS

## 2020-08-27 MED ORDER — ALBUTEROL SULFATE (2.5 MG/3ML) 0.083% IN NEBU
INHALATION_SOLUTION | RESPIRATORY_TRACT | Status: AC
  Administered 2020-08-27: 5 mg via RESPIRATORY_TRACT
  Filled 2020-08-27: qty 6

## 2020-08-27 MED ORDER — GADOBUTROL 1 MMOL/ML IV SOLN
6.0000 mL | Freq: Once | INTRAVENOUS | Status: AC | PRN
  Administered 2020-08-27: 6 mL via INTRAVENOUS

## 2020-08-27 MED ORDER — ALBUTEROL SULFATE (2.5 MG/3ML) 0.083% IN NEBU
5.0000 mg | INHALATION_SOLUTION | Freq: Once | RESPIRATORY_TRACT | Status: AC
  Filled 2020-08-27: qty 6

## 2020-08-27 MED ORDER — FUROSEMIDE 10 MG/ML IJ SOLN
60.0000 mg | Freq: Once | INTRAMUSCULAR | Status: AC
  Administered 2020-08-27: 60 mg via INTRAVENOUS
  Filled 2020-08-27: qty 8

## 2020-08-27 MED ORDER — IPRATROPIUM-ALBUTEROL 0.5-2.5 (3) MG/3ML IN SOLN
3.0000 mL | Freq: Once | RESPIRATORY_TRACT | Status: AC
  Administered 2020-08-27: 3 mL via RESPIRATORY_TRACT
  Filled 2020-08-27: qty 3

## 2020-08-27 MED ORDER — SODIUM CHLORIDE (PF) 0.9 % IJ SOLN
INTRAMUSCULAR | Status: AC
  Filled 2020-08-27: qty 50

## 2020-08-27 NOTE — Progress Notes (Signed)
    BRIEF OVERNIGHT PROGRESS REPORT  Notified by Rapid Response RN for a patient in MRI having difficulty. Patient was in for an MRI and desaturated just after the test. She has a history of Respiratory compromise and was awake and oriented on arrival with SPO2 of 89. She is Chronically on home O2 which was increased by staff. She was transferred to the ER and a breathing treatment ordered, which was administered by RT. Rapid Response RN reported to ER staff and Patient is in care of ER.  Gershon Cull MSNA ACNPC-AG Acute Care Nurse Practitioner [place]

## 2020-08-27 NOTE — ED Triage Notes (Signed)
Pt was at appointment at MRI to r/o brain metastis from established lung cancer.  Pt became hypoxic and unresponsive during scan.

## 2020-08-27 NOTE — ED Provider Notes (Signed)
[place] DEPT Provider Note   CSN: [id number] Arrival date & time: 09/16/2020  2022     History Chief Complaint  Patient presents with   Loss of Consciousness    Sara Ferguson is a 75 y.o. female with a history of non-small cell lung cancer, on 4 L nasal cannula at home, present emergency department after an episode of unresponsiveness.  Patient was having an MRI of the brain performed today and reportedly became hypoxic and unresponsive during the scan.  She reportedly "stopped breathing" and was given bag mask ventilations and a breathing treatment by RT while being brought into the ED.  On my assessment she is working hard to breath and will not speak to me. She nods yes that she feels short of breath and tired.  She denies any pain.  Per medical record reviews, the patient left the hospital approximately 2 weeks ago after a prolonged and complicated course including intubation, pleural effusion requiring thoracentesis, pericardial effusion requiring pericardiocentesis, PEA arrest with ROSC.    Her niece is her next of kin - spoke to her on the phone as noted below.  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   DNR (do not resuscitate) discussion 01/13/2020   Bilateral pleural effusion 12/31/2019   Type 2 diabetes mellitus without complication ([place]) 85/88/5027   Acute hypercapnic respiratory failure  ([place]) 12/31/2019   Pericardial effusion 12/31/2019   Essential hypertension 12/30/2019   Pleural effusion, 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     History reviewed. No pertinent family history.  Social History   Tobacco Use   Smoking status: Never   Smokeless tobacco: Never  Substance Use Topics   Alcohol use: Not Currently   Drug use: Never    Home Medications Prior to Admission medications   Medication Sig Start Date End Date Taking? Authorizing Provider  albuterol (VENTOLIN HFA) 108 (90 Base) MCG/ACT inhaler Inhale 2 puffs into the lungs every 6 (six) hours as needed for wheezing or shortness of breath. 01/05/20 09/15/2020 Yes Kc, Maren Beach, MD  amLODipine (NORVASC) 5 MG tablet TAKE 1 TABLET (5 MG TOTAL) BY MOUTH DAILY. 05/21/20 08/23/2020 Yes [name], MD  colchicine 0.6 MG tablet Take 1 tablet (0.6 mg total) by mouth 2 (two) times daily. 01/28/20  Yes Oretha Milch D, MD  furosemide (LASIX) 20 MG tablet Take 20  mg by mouth daily. 08/24/20  Yes [provider]  hydrALAZINE (APRESOLINE) 25 MG tablet Take 25 mg by mouth 2 (two) times daily.   Yes [provider]  hydrocortisone cream 1 % Apply 1 application topically daily as needed for itching (rash).   Yes [provider]  metoprolol tartrate (LOPRESSOR) 25 MG tablet Take 25 mg by mouth 2 (two) times daily.   Yes [provider]  potassium chloride (KLOR-CON) 10 MEQ tablet  Take 10 mEq by mouth 2 (two) times daily.   Yes [provider]    Allergies    Patient has no known allergies.  Review of Systems   Review of Systems  Unable to perform ROS: Acuity of condition (level 5 caveat)   Physical Exam Updated Vital Signs BP (!) 168/85 (BP Location: Left Arm)   Pulse (!) 104   Temp (!) 97.5 F (36.4 C) (Oral)   Resp (!) 21   SpO2 98%   Physical Exam Constitutional:      General: She is not in acute distress. HENT:     Head: Normocephalic and atraumatic.  Eyes:     Conjunctiva/sclera: Conjunctivae normal.     Pupils: Pupils are equal, round, and reactive to light.  Cardiovascular:     Rate and Rhythm: Regular rhythm. Tachycardia present.     Pulses: Normal pulses.     Heart sounds: Murmur heard.  Pulmonary:     Effort: Pulmonary effort is normal. No respiratory distress.     Comments: 95% on room air Crackles bilaterally, rales in lower lung fields RR 30 Abdominal:     General: There is no distension.     Tenderness: There is no abdominal tenderness.  Skin:    General: Skin is warm and dry.  Neurological:     General: No focal deficit present.     Mental Status: She is alert. Mental status is at baseline.    ED Results / Procedures / Treatments   Labs (all labs ordered are listed, but only abnormal results are displayed) Labs Reviewed  BLOOD GAS, VENOUS - Abnormal; Notable for the following components:      Result Value   pH, Ven 7.188 (*)    pCO2, Ven 80.8 (*)    pO2, Ven 88.5 (*)    Bicarbonate 29.5 (*)    All other components within normal limits  BASIC METABOLIC PANEL - Abnormal; Notable for the following components:   Glucose, Bld 208 (*)    All other components within normal limits  CBC WITH DIFFERENTIAL/PLATELET - Abnormal; Notable for the following components:   WBC 11.2 (*)    Neutro Abs 9.8 (*)    Lymphs Abs 0.6 (*)    Abs Immature Granulocytes 0.09 (*)    All other components within normal limits  BRAIN  NATRIURETIC PEPTIDE - Abnormal; Notable for the following components:   B Natriuretic Peptide 201.1 (*)    All other components within normal limits  BLOOD GAS, ARTERIAL - Abnormal; Notable for the following components:   pH, Arterial 7.333 (*)    pCO2 arterial 55.2 (*)    pO2, Arterial 57.4 (*)    Bicarbonate 28.9 (*)    Acid-Base Excess 2.2 (*)    All other components within normal limits  HEMOGLOBIN A1C - Abnormal; Notable for the following components:   Hgb A1c MFr Bld 6.2 (*)    All other components within normal limits  BASIC METABOLIC PANEL - Abnormal; Notable for the following components:  Potassium 3.4 (*)    Glucose, Bld 152 (*)    All other components within normal limits  BLOOD GAS, ARTERIAL - Abnormal; Notable for the following components:   pCO2 arterial 49.3 (*)    pO2, Arterial 56.2 (*)    Bicarbonate 31.2 (*)    Acid-Base Excess 6.1 (*)    All other components within normal limits  GLUCOSE, CAPILLARY - Abnormal; Notable for the following components:   Glucose-Capillary 143 (*)    All other components within normal limits  GLUCOSE, CAPILLARY - Abnormal; Notable for the following components:   Glucose-Capillary 184 (*)    All other components within normal limits  RESP PANEL BY RT-PCR (FLU A&B, COVID) ARPGX2  MRSA NEXT GEN BY PCR, NASAL  PROTIME-INR  APTT  MAGNESIUM  GLUCOSE, CAPILLARY  TROPONIN I (HIGH SENSITIVITY)  TROPONIN I (HIGH SENSITIVITY)    EKG EKG Interpretation  Date/Time:  Tuesday August 28 2020 00:02:57 EDT Ventricular Rate:  112 PR Interval:  119 QRS Duration: 74 QT Interval:  357 QTC Calculation: 488 R Axis:   66 Text Interpretation: Sinus tachycardia Borderline repolarization abnormality Borderline prolonged QT interval No sig change from Aug 05 2020 ecg Confirmed by Octaviano Glow [phone]) on 08/28/2020 1:54:06 AM  Radiology DG Chest Portable 1 View  Result Date: 08/22/2020 CLINICAL DATA:  Shortness of breath EXAM: PORTABLE CHEST 1  VIEW COMPARISON:  08/11/2020 FINDINGS: Cardiac shadow is enlarged but stable. Aortic calcifications are noted. Changes of increased vascular congestion and edema are noted. Additionally effusions are seen left greater than right. The right effusion is new from the prior exam. The left effusion is stable. No bony abnormality is noted. IMPRESSION: Increasing changes of CHF with new right-sided pleural effusion. Electronically Signed   By: Inez Catalina M.D.   On: 08/17/2020 21:57    Procedures .Critical Care  Date/Time: 08/21/2020 11:38 PM Performed by: Wyvonnia Dusky, MD Authorized by: Wyvonnia Dusky, MD   Critical care provider statement:    Critical care time (minutes):  45   Critical care was necessary to treat or prevent imminent or life-threatening deterioration of the following conditions:  Respiratory failure   Critical care was time spent personally by me on the following activities:  Discussions with consultants, evaluation of patient's response to treatment, examination of patient, ordering and performing treatments and interventions, ordering and review of laboratory studies, ordering and review of radiographic studies, pulse oximetry, re-evaluation of patient's condition, obtaining history from patient or surrogate and review of old charts   Medications Ordered in ED Medications  docusate sodium (COLACE) capsule 100 mg (has no administration in time range)  polyethylene glycol (MIRALAX / GLYCOLAX) packet 17 g (has no administration in time range)  insulin aspart (novoLOG) injection 0-9 Units (1 Units Subcutaneous Given 08/28/20 0340)  famotidine (PEPCID) IVPB 20 mg premix (has no administration in time range)  ondansetron (ZOFRAN) injection 4 mg (has no administration in time range)  acetaminophen (TYLENOL) tablet 650 mg (has no administration in time range)  albuterol (PROVENTIL) (2.5 MG/3ML) 0.083% nebulizer solution 2.5 mg (has no administration in time range)   ipratropium-albuterol (DUONEB) 0.5-2.5 (3) MG/3ML nebulizer solution 3 mL (3 mLs Nebulization Given 08/28/20 0727)  Chlorhexidine Gluconate Cloth 2 % PADS 6 each (has no administration in time range)  chlorhexidine (PERIDEX) 0.12 % solution 15 mL (has no administration in time range)  MEDLINE mouth rinse (has no administration in time range)  potassium chloride SA (KLOR-CON) CR tablet 40 mEq (has no  administration in time range)  furosemide (LASIX) injection 20 mg (has no administration in time range)  colchicine tablet 0.6 mg (has no administration in time range)  heparin injection 5,000 Units (has no administration in time range)  ipratropium-albuterol (DUONEB) 0.5-2.5 (3) MG/3ML nebulizer solution 3 mL (3 mLs Nebulization Given 09/13/2020 2248)  furosemide (LASIX) injection 60 mg (60 mg Intravenous Given 09/09/2020 2231)  magnesium sulfate IVPB 2 g 50 mL (2 g Intravenous New Bag/Given 08/28/20 4174)    ED Course  I have reviewed the triage vital signs and the nursing notes.  Pertinent labs & imaging results that were available during my care of the patient were reviewed by me and considered in my medical decision making (see chart for details).  Patient is presenting in respiratory distress after an episode of unresponsiveness while at MRI today.  Her clinical exam and initial work-up is concerning for pulmonary edema and possible congestive heart failure.  She is not hypotensive to suggest cardiac tamponade, massive PE, septic shock, or other form of shock.    For x-ray showing significant pulmonary edema.  IV Lasix was ordered.  Her venous gas shows acidosis with CO2 retention.  I have ordered BiPAP for respiratory distress and increased work of breathing.  Initial troponin is 13, which is less suggestive of a massive PE or myocarditis.  COVID is negative.  BNP is largely unremarkable.  I personally reviewed her prior medical records. Critical care has been consulted.  *  Outpatient CT  abdomen and MRI performed today - results are not readily available yet.  Clinical Course as of 08/28/20 0935  Mon Aug 27, 2020  2219 pH, Ven(!!): 7.188 [MT]  2219 pCO2, Ven(!!): 80.8 [MT]  2219 BiPAP ordered.  I suspect is a combination of congestive heart failure with pulmonary edema as well as COPD.  I have ordered IV Lasix [MT]  2257 I spoke to the patient's niece who is her PoA and confirms the patient is FULL CODE, and has asked for "everything to be done."  She is aware that the patient is critically ill and on BiPAP currently, and may die during this hospitalization.  In lieu of this information and her prior serious medical illness, the ICU team has been consulted [MT]    Clinical Course User Index [MT] Kaliana Albino, Carola Rhine, MD   Patient admitted to ICU given the complexity of her recent hospitalization and prior medical illnesses, and her high risk for decompensation.  Final Clinical Impression(s) / ED Diagnoses Final diagnoses:  Acute respiratory failure with hypoxia and hypercapnia (HCC)  Shortness of breath    Rx / DC Orders ED Discharge Orders     None        Athelene Hursey, Carola Rhine, MD 08/28/20 [phone]

## 2020-08-27 NOTE — H&P (Signed)
NAME:  Jyrah Blye MRN:  [id number] DOB:  09-17-1945 LOS: 0 ADMISSION DATE:  09/01/2020 DATE OF SERVICE:  08/23/2020  CHIEF COMPLAINT:  respiratory distress/acute hypoxemic respiratory failure  HISTORY & PHYSICAL  History of Present Illness  This 75 y.o. African American female reformed marijuana smoker presented to the [facility] Emergency Department after rapid response team was called for respiratory distress when the patient was at outpatient imaging (planned MRI brain for metastatic lung cancer evaluation).  She was reportedly hypoxic and unresponsive during the scan.  At the time of clinical interview, she is on BiPAP 14/6, FiO2 45%.  SpO2 99%.  The patient is tolerating BiPAP and is mentating well.  She denies pain or discomfort.  Her dyspnea is improved on BiPAP.  In the ER, she had a CT of the abdomen and pelvis, which revealed persistent pericardial and left pleural effusion but also noted a new right sided pleural effusion.  Peripheral venous gas showed pH 7.19, PCO2 80, for which the patient was started on DuoNeb, furosemide and BiPAP.  REVIEW OF SYSTEMS Constitutional: No weight loss. No night sweats. No fever. No chills. No fatigue. HEENT: No headaches, dysphagia, sore throat, otalgia, nasal congestion, PND CV:  Orthopnea, paroxysmal nocturnal dyspnea.  Lower extremity edema.  No chest pain, palpitations GI:  No abdominal pain, nausea, vomiting, diarrhea, change in bowel pattern, anorexia Resp: Exertional dyspnea with dyspnea at rest.  Nonproductive cough.  No mucus, hemoptysis, wheezing  GU: no dysuria, change in color of urine, no urgency or frequency.  No flank pain. MS:  No joint pain or swelling. No myalgias,  No decreased range of motion.  Psych:  No change in mood or affect. No memory loss. Skin: no rash or lesions.   Past Medical/Surgical/Social/Family History   Past Medical History:  Diagnosis Date   Arthritis    Diabetes mellitus without  complication ([place])    Hypertension    Non-small cell lung cancer ([place])    Dx 12/30/19 by thoracentesis     Past Surgical History:  Procedure Laterality Date   ABDOMINAL HYSTERECTOMY     ARTERIAL LINE INSERTION N/A 08/07/2020   Procedure: ARTERIAL LINE INSERTION;  Surgeon: Dixie Dials, MD;  Location: [place] [facility];  Service: Cardiovascular;  Laterality: N/A;   ECTOPIC PREGNANCY SURGERY     PERICARDIOCENTESIS N/A 08/07/2020   Procedure: PERICARDIOCENTESIS;  Surgeon: Dixie Dials, MD;  Location: [place] [facility];  Service: Cardiovascular;  Laterality: N/A;    Social History   Tobacco Use   Smoking status: Never   Smokeless tobacco: Never  Substance Use Topics   Alcohol use: Not Currently    History reviewed. No pertinent family history.   Procedures:     Significant Diagnostic Tests:     Micro Data:   Results for orders placed or performed during the hospital encounter of 08/28/2020  Resp Panel by RT-PCR (Flu A&B, Covid) Nasopharyngeal Swab     Status: None   Collection Time: 08/26/2020  9:42 PM   Specimen: Nasopharyngeal Swab; Nasopharyngeal(NP) swabs in vial transport medium  Result Value Ref Range Status   SARS Coronavirus 2 by RT PCR NEGATIVE NEGATIVE Final    Comment: (NOTE) SARS-CoV-2 target nucleic acids are NOT DETECTED.  The SARS-CoV-2 RNA is generally detectable in upper respiratory specimens during the acute phase of infection. The lowest concentration of SARS-CoV-2 viral copies this assay can detect is 138 copies/mL. A negative result does not preclude SARS-Cov-2 infection and should not  be used as the sole basis for treatment or other patient management decisions. A negative result may occur with  improper specimen collection/handling, submission of specimen other than nasopharyngeal swab, presence of viral mutation(s) within the areas targeted by this assay, and inadequate number of viral copies(<138 copies/mL). A negative result must be combined  with clinical observations, patient history, and epidemiological information. The expected result is Negative.  Fact Sheet for Patients:  EntrepreneurPulse.com.au  Fact Sheet for Healthcare Providers:  IncredibleEmployment.be  This test is no t yet approved or cleared by the [place] FDA and  has been authorized for detection and/or diagnosis of SARS-CoV-2 by FDA under an Emergency Use Authorization (EUA). This EUA will remain  in effect (meaning this test can be used) for the duration of the COVID-19 declaration under Section 564(b)(1) of the Act, 21 U.S.C.section 360bbb-3(b)(1), unless the authorization is terminated  or revoked sooner.       Influenza A by PCR NEGATIVE NEGATIVE Final   Influenza B by PCR NEGATIVE NEGATIVE Final    Comment: (NOTE) The Xpert Xpress SARS-CoV-2/FLU/RSV plus assay is intended as an aid in the diagnosis of influenza from Nasopharyngeal swab specimens and should not be used as a sole basis for treatment. Nasal washings and aspirates are unacceptable for Xpert Xpress SARS-CoV-2/FLU/RSV testing.  Fact Sheet for Patients: EntrepreneurPulse.com.au  Fact Sheet for Healthcare Providers: IncredibleEmployment.be  This test is not yet approved or cleared by the [place] FDA and has been authorized for detection and/or diagnosis of SARS-CoV-2 by FDA under an Emergency Use Authorization (EUA). This EUA will remain in effect (meaning this test can be used) for the duration of the COVID-19 declaration under Section 564(b)(1) of the Act, 21 U.S.C. section 360bbb-3(b)(1), unless the authorization is terminated or revoked.  Performed at [facility], [place] [address] [place], [place] [address]       Antimicrobials:      Interim history/subjective:     Objective   BP 125/75   Pulse (!) 108   Temp (!) 96.8 F (36 C) (Oral)   Resp 19   SpO2 91%      There were no vitals filed for this visit. No intake or output data in the 24 hours ending 08/20/2020 2348  FiO2 (%):  [45 %] 45 %   Examination: GENERAL:  alert, oriented to time, person and place, pleasant, well-developed. No acute distress on BiPAP. HEAD: normocephalic, atraumatic EYE: PERRLA, EOM intact, no scleral icterus, no pallor. NOSE: nares are patent. No polyps. No exudate. No sinus tenderness. THROAT/ORAL CAVITY: Normal dentition. No oral thrush. No exudate. Mucous membranes are moist. No tonsillar enlargement.  NECK: supple, no thyromegaly, no JVD, no lymphadenopathy. Trachea midline. CHEST/LUNG: symmetric in development and expansion.  Decreased breath sounds in the bases.  Bilateral crackles.  No wheezes. HEART: Regular S1 and S2 without murmur, rub or gallop. ABDOMEN: soft, nontender, nondistended. Normoactive bowel sounds. No rebound. No guarding. No hepatosplenomegaly. EXTREMITIES: Edema: 3+ and pitting. No cyanosis. No clubbing. 2+ DP pulses LYMPHATIC: no cervical/axillary/inguinal lymph nodes appreciated MUSCULOSKELETAL: No point tenderness. No bulk atrophy. Joints: normal.  SKIN:  No rash or lesion. NEUROLOGIC: Doll's eyes intact. Corneal reflex intact. Spontaneous respirations intact. Cranial nerves II-XII are grossly symmetric and physiologic. Babinski absent. No sensory deficit. Motor: 5/5 @ RUE, 5/5 @ LUE, 5/5 @ RLL,  5/5 @ LLL.  DTR: 2+ @ R biceps, 2+ @ L biceps, 2+ @ R patellar,  2+ @ L patellar. No cerebellar  signs. Gait was not assessed.   Resolved Hospital Problem list      Assessment & Plan:   ASSESSMENT/PLAN:  ASSESSMENT (included in the Hospital Problem List)  Principal Problem:   Acute hypercapnic respiratory failure (HCC) Active Problems:   Pleural effusion, malignant   Bilateral pleural effusion   Elevated brain natriuretic peptide (BNP) level   Pulmonary edema   Pericardial effusion   Adenocarcinoma of left lung, stage 4 (HCC)   Type 2  diabetes mellitus without complication (HCC)   DNR (do not resuscitate) discussion   Acute hypoxemic respiratory failure ([place])   By systems: PULMONARY Acute on chronic hypoxemic respiratory failure Lung cancer, stage IV 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   Check ABG Already received furosemide in ER.  Monitor urine output.  Chest CT without contrast in am. If no significant improvement, may need to consider palliative thoracentesis or even PleurX catheter placement.   CARDIOVASCULAR Pericardial effusion Reported to be stable on CT abd/pelvis. Obtain noncontrast chest CT   RENAL: No acute issues   GASTROINTESTINAL GI PROPHYLAXIS: famotidine   HEMATOLOGIC DVT PROPHYLAXIS: SCDs for now in anticipation of thoracentesis in am.   INFECTIOUS: No acute issues   ENDOCRINE Type 2 diabetes Sliding scale insulin for now   NEUROLOGIC: No acute issues   PLAN/RECOMMENDATIONS  Admit to ICU under my service (Attending: Renee Pain, MD) with the diagnoses highlighted above in the active Hospital Problem List (ASSESSMENT). IV fluids: KVO Meds: see above Labs: PT/PTT Imaging: noncontrast chest CT in am NUTRITION: diabetic    My assessment, plan of care, findings, medications, side effects, etc. were discussed with: patient (answered all questions to patient's satisfaction).   Best practice:  Diet: diabetic Pain/Anxiety/Delirium protocol (if indicated): N/A VAP protocol (if indicated): elevate HOB 30 degrees at all times DVT prophylaxis: SCDs for now GI prophylaxis: famotidine Glucose control: sliding scale insulin Mobility/Activity: bedrest   Code Status: DNR  The patient clearly verbalized that she understood that the healthcare team's intention at this time is to improve her clinical status such that she can be discharged from the facility.  However, she stipulated that she did NOT wish for any  resuscitative efforts to be pursued in the event of cardiac or respiratory arrest. Family Communication:   no family at the bedside Disposition: admit to ICU   Labs   CBC: Recent Labs  Lab 08/21/20 1148 08/20/2020 2142  WBC 7.8 11.2*  NEUTROABS 6.2 9.8*  HGB 11.5* 12.0  HCT 35.2* 39.4  MCV 87.1 92.5  PLT 426* 595    Basic Metabolic Panel: Recent Labs  Lab 08/21/20 1148 09/11/2020 2142  NA 136 141  K 4.2 3.5  CL 99 99  CO2 27 30  GLUCOSE 139* 208*  BUN 12 16  CREATININE 0.70 0.71  CALCIUM 9.3 9.4   GFR: Estimated Creatinine Clearance: 50.9 mL/min (by C-G formula based on SCr of 0.71 mg/dL). Recent Labs  Lab 08/21/20 1148 08/24/2020 2142  WBC 7.8 11.2*    Liver Function Tests: Recent Labs  Lab 08/21/20 1148  AST 54*  ALT 51*  ALKPHOS 130*  BILITOT 0.7  PROT 7.8  ALBUMIN 2.7*   No results for input(s): LIPASE, AMYLASE in the last 168 hours. No results for input(s): AMMONIA in the last 168 hours.  ABG    Component Value Date/Time   PHART 7.440 08/07/2020 2009   PCO2ART 32.4 08/07/2020 2009   PO2ART 77 (L)  08/07/2020 2009   HCO3 29.5 (H) 09/06/2020 2142   TCO2 23 08/07/2020 2009   ACIDBASEDEF 0.5 09/13/2020 2142   O2SAT 94.2 09/10/2020 2142     Coagulation Profile: No results for input(s): INR, PROTIME in the last 168 hours.  Cardiac Enzymes: No results for input(s): CKTOTAL, CKMB, CKMBINDEX, TROPONINI in the last 168 hours.  HbA1C: Hgb A1c MFr Bld  Date/Time Value Ref Range Status  08/06/2020 03:30 AM 6.3 (H) 4.8 - 5.6 % Final    Comment:    (NOTE)         Prediabetes: 5.7 - 6.4         Diabetes: >6.4         Glycemic control for adults with diabetes: <7.0   12/30/2019 11:18 AM 10.7 (H) 4.8 - 5.6 % Final    Comment:    (NOTE) Pre diabetes:          5.7%-6.4%  Diabetes:              >6.4%  Glycemic control for   <7.0% adults with diabetes     CBG: No results for input(s): GLUCAP in the last 168 hours.   Past Medical History    Past Medical History:  Diagnosis Date   Arthritis    Diabetes mellitus without complication ([place])    Hypertension    Non-small cell lung cancer ([place])    Dx 12/30/19 by thoracentesis       Surgical History    Past Surgical History:  Procedure Laterality Date   ABDOMINAL HYSTERECTOMY     ARTERIAL LINE INSERTION N/A 08/07/2020   Procedure: ARTERIAL LINE INSERTION;  Surgeon: Dixie Dials, MD;  Location: [facility];  Service: Cardiovascular;  Laterality: N/A;   ECTOPIC PREGNANCY SURGERY     PERICARDIOCENTESIS N/A 08/07/2020   Procedure: PERICARDIOCENTESIS;  Surgeon: Dixie Dials, MD;  Location: [facility];  Service: Cardiovascular;  Laterality: N/A;      Social History   Social History   Socioeconomic History   Marital status: Widowed    Spouse name: Not on file   Number of children: Not on file   Years of education: Not on file   Highest education level: Not on file  Occupational History   Not on file  Tobacco Use   Smoking status: Never   Smokeless tobacco: Never  Substance and Sexual Activity   Alcohol use: Not Currently   Drug use: Never   Sexual activity: Not on file  Other Topics Concern   Not on file  Social History Narrative   Not on file   Social Determinants of Health   Financial Resource Strain: Low Risk    Difficulty of Paying Living Expenses: Not hard at all  Food Insecurity: No Food Insecurity   Worried About Running Out of Food in the Last Year: Never true   [place] in the Last Year: Never true  Transportation Needs: No Transportation Needs   Lack of Transportation (Medical): No   Lack of Transportation (Non-Medical): No  Physical Activity: Inactive   Days of Exercise per Week: 0 days   Minutes of Exercise per Session: 0 min  Stress: No Stress Concern Present   Feeling of Stress : Not at all  Social Connections: Socially Isolated   Frequency of Communication with Friends and Family: Once a week   Frequency of Social  Gatherings with Friends and Family: Once a week   Attends Religious Services: Never   Active  Member of Clubs or Organizations: No   Attends Archivist Meetings: 1 to 4 times per year   Marital Status: Widowed      Family History   History reviewed. No pertinent family history. family history is not on file.    Allergies No Known Allergies    Current Medications No current facility-administered medications for this encounter.  Current Outpatient Medications:    albuterol (VENTOLIN HFA) 108 (90 Base) MCG/ACT inhaler, Inhale 2 puffs into the lungs every 6 (six) hours as needed for wheezing or shortness of breath., Disp: 1 each, Rfl: 1   amLODipine (NORVASC) 5 MG tablet, TAKE 1 TABLET (5 MG TOTAL) BY MOUTH DAILY., Disp: 30 tablet, Rfl: 1   colchicine 0.6 MG tablet, Take 1 tablet (0.6 mg total) by mouth 2 (two) times daily., Disp: 90 tablet, Rfl: 1   furosemide (LASIX) 20 MG tablet, Take 20 mg by mouth daily., Disp: , Rfl:    hydrALAZINE (APRESOLINE) 25 MG tablet, Take 25 mg by mouth 2 (two) times daily., Disp: , Rfl:    hydrocortisone cream 1 %, Apply 1 application topically daily as needed for itching (rash)., Disp: , Rfl:    metoprolol tartrate (LOPRESSOR) 25 MG tablet, Take 25 mg by mouth 2 (two) times daily., Disp: , Rfl:    potassium chloride (KLOR-CON) 10 MEQ tablet, Take 10 mEq by mouth 2 (two) times daily., Disp: , Rfl:   Facility-Administered Medications Ordered in Other Encounters:    sodium chloride (PF) 0.9 % injection, , , ,    Home Medications  Prior to Admission medications   Medication Sig Start Date End Date Taking? Authorizing Provider  albuterol (VENTOLIN HFA) 108 (90 Base) MCG/ACT inhaler Inhale 2 puffs into the lungs every 6 (six) hours as needed for wheezing or shortness of breath. 01/05/20 09/13/2020 Yes Kc, Maren [place], MD  amLODipine (NORVASC) 5 MG tablet TAKE 1 TABLET (5 MG TOTAL) BY MOUTH DAILY. 05/21/20 09/09/2020 Yes [name], MD  colchicine 0.6 MG  tablet Take 1 tablet (0.6 mg total) by mouth 2 (two) times daily. 01/28/20  Yes Oretha Milch D, MD  furosemide (LASIX) 20 MG tablet Take 20 mg by mouth daily. 08/24/20  Yes [provider]  hydrALAZINE (APRESOLINE) 25 MG tablet Take 25 mg by mouth 2 (two) times daily.   Yes [provider]  hydrocortisone cream 1 % Apply 1 application topically daily as needed for itching (rash).   Yes [provider]  metoprolol tartrate (LOPRESSOR) 25 MG tablet Take 25 mg by mouth 2 (two) times daily.   Yes [provider]  potassium chloride (KLOR-CON) 10 MEQ tablet Take 10 mEq by mouth 2 (two) times daily.   Yes [provider]      Critical care time: 60 minutes.  The treatment and management of the patient's condition was required based on the threat of imminent deterioration. This time reflects time spent by the physician evaluating, providing care and managing the critically ill patient's care. The time was spent at the immediate bedside (or on the same floor/unit and dedicated to this patient's care). Time involved in separately billable procedures is NOT included int he critical care time indicated above. Family meeting and update time may be included above if and only if the patient is unable/incompetent to participate in clinical interview and/or decision making, and the discussion was necessary to determining treatment decisions.   Renee Pain, MD Board Certified by the ABIM, [place]  [place] Pager: [phone]

## 2020-08-27 NOTE — Progress Notes (Deleted)
[place] CONSULT NOTE  Patient Care Team: [name], MD as PCP - General (Hematology and Oncology) Brien Mates, RN as Oncology Nurse Navigator (Oncology)  CHIEF COMPLAINTS/PURPOSE OF CONSULTATION:   Adenocarcinoma of the lung  ASSESSMENT & PLAN:   No problem-specific Assessment & Plan notes found for this encounter.  No orders of the defined types were placed in this encounter.    HISTORY OF PRESENTING ILLNESS:   Sara Ferguson 75 y.o. female is here because of new diagnosis of adenocarcinoma lung.  Oncology History Overview Note  Chronology  75 year old female with history of hypertension, diabetes presented to the ED with hypoxia shortness of breath found to have large left-sided pleural effusion as well as pericardial effusion. Patient tells me that she had SOB for about 5 months prior to presentation and she initially thought it was bronchitis. She has also noted some weight loss of about 15 lbs and attributed this to dietary changes. Cough noted, lot of phlegm , no hemoptysis. A day before presentation to the hospital, she noticed that she could not walk to the mailbox and hence went to the hospital. 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  Repeat chest x-ray 11/15 showed persistent consolidation/effusion of the left lower lobe, and repeat thoracentesis performed 11/17 with 850 mL fluid removal. Patient underwent CT chest showed improved aeration of the left lung with residual left infrahilar masslike density possibly reflecting the primary bronchogenic carcinoma, grossly stable left axillary confluent mediastinal and hilar adenopathy consistent with metastatic disease, left adrenal nodule suspicious for metastasis, new small dependent right pleural effusion stable pericardial effusion. CT abdomen pelvis showed no evidence of  primary intra abdominal or intrapelvic malignancy. Partial visualization of heterogeneously enhancing left lower lung opacities as well as Left pleural effusion. 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 There is a 2.4 cm LEFT adrenal nodule. Findings are concerning for metastatic disease.  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 MR brain negative for malignancy.  Foundation one EGFR exon 19 deletion. Started tagrisso Dec 2nd week 2021. Imaging from March 2022 consistent with response.   Adenocarcinoma of left lung, stage 4 ([place])  01/13/2020 Initial Diagnosis   Adenocarcinoma of left lung, stage 4 ([place])   01/13/2020 Cancer Staging   Staging form: Lung, AJCC 8th Edition - Clinical stage from 01/13/2020: Stage IVB (cTX, cN3, cM1c) - Signed by [name], MD on 01/13/2020   01/30/2020 -  Chemotherapy   The patient had [No matching medication found in this treatment plan]  for chemotherapy treatment.     She was admitted from 6/19-6/30 for worsening SOB She was found to have imaging concerning for recurrent pericardial effusion, left breast heterogenous density with skin thickening concerning for carcinoma, Left-greater-than-right heterogeneous interstitial and airspace opacity, concerning for pulmonary edema or infection. Left lower lobe bronchi are occluded., axillary and metastatic mediastinal LN Cytology from pericardial fluid  confirmed adenocarcinoma.  [place]  She never smoked or chewed any tobacco products No occupational exposure.  Worked in Scientist, research (medical) [employer], mom died of liver cancer in 81's, sister died of breast cancer in 22's  MEDICAL HISTORY:  Past Medical History:  Diagnosis Date   Arthritis    Diabetes mellitus without complication ([place])    Hypertension    Non-small cell lung cancer ([place])    Dx 12/30/19 by thoracentesis     SURGICAL HISTORY: Past Surgical History:  Procedure Laterality Date   ABDOMINAL HYSTERECTOMY     ARTERIAL LINE INSERTION N/A 08/07/2020   Procedure: ARTERIAL LINE INSERTION;  Surgeon: Dixie Dials, MD;  Location: [facility];  Service: Cardiovascular;  Laterality: N/A;   ECTOPIC PREGNANCY SURGERY     PERICARDIOCENTESIS N/A 08/07/2020   Procedure: PERICARDIOCENTESIS;  Surgeon: Dixie Dials, MD;  Location: [facility];  Service: Cardiovascular;  Laterality: N/A;    SOCIAL HISTORY: Social History   Socioeconomic History   Marital status: Widowed    Spouse name: Not on file   Number of children: Not on file   Years of education: Not on file   Highest education level: Not on file  Occupational History   Not on file  Tobacco Use   Smoking status: Never   Smokeless tobacco: Never  Substance and Sexual Activity   Alcohol use: Not Currently   Drug use: Never   Sexual activity: Not on file  Other Topics Concern   Not on file  Social History Narrative   Not on file   Social Determinants of Health   Financial Resource Strain: Low Risk    Difficulty of Paying Living Expenses: Not hard at all  Food Insecurity: No Food Insecurity   Worried About Running Out of Food in the Last Year: Never true   [place] in the Last Year: Never true  Transportation Needs: No Transportation Needs   Lack of Transportation (Medical): No   Lack of Transportation (Non-Medical): No  Physical Activity: Inactive   Days of Exercise per Week: 0 days   Minutes of Exercise per  Session: 0 min  Stress: No Stress Concern Present   Feeling of Stress : Not at all  Social Connections: Socially Isolated   Frequency of Communication with Friends and Family: Once a week   Frequency of Social Gatherings with Friends and Family: Once a week   Attends Religious Services: Never   Marine scientist or Organizations: No   Attends Archivist Meetings: 1 to 4 times per year   Marital Status: Widowed  Human resources officer Violence: Not At Risk   Fear of Current or Ex-Partner: No   Emotionally Abused: No   Physically Abused: No   Sexually Abused: No    FAMILY HISTORY: No family history on file.  ALLERGIES:  has No Known Allergies.  MEDICATIONS:  No current facility-administered medications for this visit.   Current Outpatient Medications  Medication Sig Dispense Refill   albuterol (VENTOLIN HFA) 108 (90 Base) MCG/ACT inhaler Inhale 2 puffs into the lungs every 6 (six) hours as needed for wheezing or shortness of breath. 1 each 1   amLODipine (NORVASC) 5 MG tablet TAKE 1 TABLET (5 MG TOTAL) BY MOUTH DAILY. 30 tablet 1   colchicine 0.6 MG tablet Take 1 tablet (0.6 mg total) by mouth 2 (two) times daily. 90 tablet 1   guaifenesin (ROBITUSSIN) 100 MG/5ML syrup Take 200 mg by mouth 3 (three) times daily as needed for cough.     hydrocortisone cream 1 % Apply 1 application topically daily as  needed for itching (rash).     metoprolol tartrate (LOPRESSOR) 50 MG tablet Take 25 mg by mouth 2 (two) times daily.     Facility-Administered Medications Ordered in Other Visits  Medication Dose Route Frequency Provider Last Rate Last Admin   sodium chloride (PF) 0.9 % injection              PHYSICAL EXAMINATION: ECOG PERFORMANCE STATUS: 2 - Symptomatic, <50% confined to bed  There were no vitals filed for this visit.  There were no vitals filed for this visit.  Physical Exam Constitutional:      General: She is not in acute distress.    Appearance: Normal  appearance. She is not toxic-appearing.  HENT:     Head: Normocephalic and atraumatic.     Mouth/Throat:     Mouth: Mucous membranes are moist.  Cardiovascular:     Rate and Rhythm: Normal rate and regular rhythm.     Pulses: Normal pulses.     Heart sounds: Normal heart sounds.  Pulmonary:     Comments: Decreased air entry LLL.Scattered crackles Chest:     Comments: Left breast skin redness in upper inner quadrant. Palpable axillary LN. Abdominal:     General: Abdomen is flat.     Palpations: Abdomen is soft.  Musculoskeletal:        General: No swelling (BLE swelling improved). Normal range of motion.     Right lower leg: Edema (Trace, could be related to medication) present.     Left lower leg: Edema (Trace) present.  Lymphadenopathy:     Cervical: No cervical adenopathy.  Skin:    General: Skin is warm and dry.  Neurological:     General: No focal deficit present.     Mental Status: She is alert and oriented to person, place, and time.  Psychiatric:        Mood and Affect: Mood normal.        Behavior: Behavior normal.     LABORATORY DATA:  I have reviewed the data as listed Lab Results  Component Value Date   WBC 7.8 08/21/2020   HGB 11.5 (L) 08/21/2020   HCT 35.2 (L) 08/21/2020   MCV 87.1 08/21/2020   PLT 426 (H) 08/21/2020     Chemistry      Component Value Date/Time   NA 136 08/21/2020 1148   K 4.2 08/21/2020 1148   CL 99 08/21/2020 1148   CO2 27 08/21/2020 1148   BUN 12 08/21/2020 1148   CREATININE 0.70 08/21/2020 1148   CREATININE 0.79 04/26/2020 1513      Component Value Date/Time   CALCIUM 9.3 08/21/2020 1148   ALKPHOS 130 (H) 08/21/2020 1148   AST 54 (H) 08/21/2020 1148   AST 24 04/26/2020 1513   ALT 51 (H) 08/21/2020 1148   ALT 23 04/26/2020 1513   BILITOT 0.7 08/21/2020 1148   BILITOT 0.4 04/26/2020 1513     RADIOGRAPHIC STUDIES: I have personally reviewed the radiological images as listed and agreed with the findings in the  report. DG Chest 1 View  Result Date: 08/11/2020 CLINICAL DATA:  Shortness of breath.  Thoracentesis EXAM: CHEST  1 VIEW COMPARISON:  Yesterday FINDINGS: Negative for pneumothorax. Continued extensive opacification of the left chest from pulmonary opacity and small loculated pleural effusion by CT. Cardiopericardial enlargement with large pericardial effusion by CT. Improved aeration on the right, although persistent perihilar infiltrate. IMPRESSION: 1. No acute finding related to thoracentesis. 2. No detected change from yesterday other  than improved lung volumes. Electronically Signed   By: Monte Fantasia M.D.   On: 08/11/2020 10:29   CT Chest Wo Contrast  Result Date: 08/05/2020 CLINICAL DATA:  Shortness of breath EXAM: CT CHEST WITHOUT CONTRAST TECHNIQUE: Multidetector CT imaging of the chest was performed following the standard protocol without IV contrast. COMPARISON:  04/19/2020 FINDINGS: Cardiovascular: Large pericardial effusion measuring approximately 2.1 cm in thickness. Calcific aortic atherosclerosis. Mediastinum/Nodes: Increased density within the upper mediastinum, likely lymphadenopathy. There is left greater than right axillary adenopathy. Asymmetric density within the left breast. There is also left breast skin thickening. Lungs/Pleura: Left-greater-than-right heterogeneous interstitial and airspace opacity. Peribronchial thickening. Left lower lobe bronchi are occluded. There is complete complex of the left lower lobe. Upper Abdomen: 16 mm left adrenal mass. Musculoskeletal: Sclerotic lesions of the left second,, right third and left seventh ribs. Numerous sclerotic lesions within the vertebral bodies, greatest at T3. IMPRESSION: 1. Left breast heterogeneous density with skin thickening is concerning for carcinoma the skin thickening could be due to edema. Correlation with mammography is recommended. 2. Large pericardial effusion, measuring up to 2.1 cm in thickness, progressed. 3.  Left-greater-than-right heterogeneous interstitial and airspace opacity, concerning for pulmonary edema or infection. Left lower lobe bronchi are occluded. Reportedly the patient has a history of non-small cell lung carcinoma, but no distinct mass is visualized on this noncontrast study. Aeration of both lungs is much worse than on 04/19/2020. 4. Left greater than right axillary and mediastinal lymphadenopathy, likely metastatic. 5. Sclerotic lesions of the left second, right third and left seventh ribs, consistent with metastatic disease. 6. 16 mm left adrenal mass, concerning for metastatic disease. Aortic Atherosclerosis (ICD10-I70.0). Electronically Signed   By: Ulyses Jarred M.D.   On: 08/05/2020 20:53   CARDIAC CATHETERIZATION  Result Date: 08/07/2020 Monitor patient closely in CCU under CCM guidance/Management Continue pericardial drain for 2-3 days till effusion is less than 30 ml per day.  DG Chest Port 1 View  Result Date: 08/10/2020 CLINICAL DATA:  Shortness of breath.  Extubation. EXAM: PORTABLE CHEST 1 VIEW COMPARISON:  08/09/2020 FINDINGS: Endotracheal tube and nasogastric tube have been removed. Again noted is marked consolidation and opacification throughout the left hemithorax with minimal aeration near the left lung apex. Left lung aeration has slightly decreased. Markedly decreased aeration in the right lung with increased patchy interstitial densities in the right lung. Cardiac silhouette is obscured by the lung densities. Negative for pneumothorax. Again noted is a small bore catheter overlying the left side of the heart. IMPRESSION: 1. Decreased aeration in both lungs following removal of the endotracheal tube. Difficult to exclude worsening edema or interstitial lung densities. 2. Stable position of the small bore tube overlying the left side of the chest. Electronically Signed   By: Markus Daft M.D.   On: 08/10/2020 09:02   DG CHEST PORT 1 VIEW  Result Date: 08/09/2020 CLINICAL  DATA:  Intubation.  Respiratory failure. EXAM: PORTABLE CHEST 1 VIEW COMPARISON:  08/07/2020.  CT 08/05/2020. FINDINGS: Endotracheal tube, NG tube in stable position. Cardiomegaly again noted. Bilateral pulmonary infiltrates/edema, improved from prior exam. Large left pleural effusion. No pneumothorax. Degenerative changes scoliosis thoracic spine. IMPRESSION: 1.  Endotracheal tube and NG tube in stable position. 2. Cardiomegaly. Bilateral pulmonary infiltrates/edema, improved from prior exam. Large left pleural effusion. Electronically Signed   By: Marcello Moores  Register   On: 08/09/2020 07:19   DG CHEST PORT 1 VIEW  Result Date: 08/07/2020 CLINICAL DATA:  75 year old female status post intubation. EXAM: PORTABLE  CHEST 1 VIEW COMPARISON:  Chest radiograph dated 08/05/2020 and CT dated 08/05/2020. FINDINGS: Endotracheal tube with tip approximately 17 mm above the carina. Significant interval progression of bilateral pulmonary opacities compared to the prior radiograph likely representing worsening edema/CHF or pneumonia. Probable small bilateral pleural effusions. No pneumothorax. The cardiac borders are silhouetted. Atherosclerotic calcification of the aorta. No acute osseous pathology. Osteopenia. IMPRESSION: 1. Endotracheal tube above the carina. 2. Significant interval progression of bilateral pulmonary opacities likely representing worsening edema/CHF or pneumonia. Electronically Signed   By: Anner Crete M.D.   On: 08/07/2020 19:31   DG Chest Port 1 View  Result Date: 08/05/2020 CLINICAL DATA:  Cough and shortness of breath. EXAM: PORTABLE CHEST 1 VIEW COMPARISON:  February 23, 2020 FINDINGS: Markedly enlarged and globular cardiac silhouette. Calcific atherosclerotic disease of the aorta. Probable left pleural effusion. Left lower lobe airspace disease versus atelectasis. Bilateral interstitial opacities with central predominance. Osseous structures are without acute abnormality. Soft tissues are grossly  normal. IMPRESSION: 1. Markedly enlarged and globular cardiac silhouette. Cardiomyopathy versus enlarging pericardial effusion. 2. Probable left pleural effusion with left lower lobe airspace disease versus atelectasis. 3. Bilateral interstitial opacities with central predominance may represent interstitial pulmonary edema. Electronically Signed   By: Fidela Salisbury M.D.   On: 08/05/2020 17:46   DG Abd Portable 1V  Result Date: 08/08/2020 CLINICAL DATA:  OG tube placement EXAM: PORTABLE ABDOMEN - 1 VIEW COMPARISON:  CT 04/19/2020, chest x-ray 08/07/2020 FINDINGS: Esophageal tube tip and side-port presumably project over the gastric body. Mild diffuse air-filled bowel without definitive obstructive pattern. Catheter tubing projects over the left lower chest central abdomen and pelvis. Sclerotic lesion within the left anterior iliac spine and left pubic symphysis. IMPRESSION: 1. Esophageal tube tip and side port overlie the gastric body 2. Mild diffuse increased bowel gas without definitive obstructive pattern 3. Osseous sclerotic metastatic disease. Electronically Signed   By: Donavan Foil M.D.   On: 08/08/2020 16:13   ECHOCARDIOGRAM COMPLETE  Result Date: 08/06/2020    ECHOCARDIOGRAM REPORT   Patient Name:   JANALEE GROBE Date of Exam: 08/06/2020 Medical Rec #:  [id number]      Height:       64.0 in Accession #:    [id number]     Weight:       142.9 lb Date of Birth:  08/21/1945      BSA:          1.696 m Patient Age:    3 years       BP:           164/72 mmHg Patient Gender: F              HR:           101 bpm. Exam Location:  Inpatient Procedure: 2D Echo, Cardiac Doppler and Color Doppler Indications:     I31.3 Pericardial effusion  History:         Patient has prior history of Echocardiogram examinations, most                  recent 01/26/2020. Risk Factors:Hypertension and Diabetes.                  Cancer.  Sonographer:     Jonelle Sidle Dance Referring Phys:  [id number] Elmarie Shiley Diagnosing Phys:  Dixie Dials MD IMPRESSIONS  1. Left ventricular ejection fraction, by estimation, is 60 to 65%. The left ventricle has normal function. The left ventricle has no  regional wall motion abnormalities. Left ventricular diastolic parameters are consistent with Grade I diastolic dysfunction (impaired relaxation).  2. Right ventricular systolic function is normal. The right ventricular size is normal.  3. Moderate pericardial effusion. The pericardial effusion is circumferential. There is no evidence of cardiac tamponade.  4. The mitral valve is degenerative. Mild mitral valve regurgitation.  5. The aortic valve is tricuspid. There is mild calcification of the aortic valve. There is mild thickening of the aortic valve. Aortic valve regurgitation is not visualized. Mild aortic valve sclerosis is present, with no evidence of aortic valve stenosis.  6. The inferior vena cava is dilated in size with <50% respiratory variability, suggesting right atrial pressure of 15 mmHg. 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 FINDINGS  Left Ventricle: Left ventricular ejection fraction, by estimation, is 60 to 65%. The left ventricle has normal function. The left ventricle has no regional wall motion abnormalities. The left ventricular internal cavity size was normal in size. There is  borderline concentric left ventricular hypertrophy. Left ventricular diastolic parameters are consistent with Grade I diastolic dysfunction (impaired relaxation). Right Ventricle: The right ventricular size is normal. No increase in right ventricular wall thickness. Right ventricular systolic function is normal. Left Atrium: Left atrial size was normal in size. Right Atrium: Right atrial size was normal in size. Pericardium: A moderately sized pericardial effusion is present. The pericardial effusion is circumferential. The pericardial effusion appears to contain mixed echogenic material. There is no  evidence of cardiac tamponade. Mitral Valve: The mitral valve is degenerative in appearance. There is mild thickening of the mitral valve leaflet(s). There is mild calcification of the mitral valve leaflet(s). Mild mitral annular calcification. Mild mitral valve regurgitation. Tricuspid Valve: The tricuspid valve is normal in structure. Tricuspid valve regurgitation is trivial. Aortic Valve: The aortic valve is tricuspid. There is mild calcification of the aortic valve. There is mild thickening of the aortic valve. Aortic valve regurgitation is not visualized. Mild aortic valve sclerosis is present, with no evidence of aortic valve stenosis. Pulmonic Valve: The pulmonic valve was normal in structure. Pulmonic valve regurgitation is not visualized. Aorta: The aortic root is normal in size and structure. There is minimal (Grade I) atheroma plaque involving the ascending aorta. Venous: The inferior vena cava is dilated in size with less than 50% respiratory variability, suggesting right atrial pressure of 15 mmHg. IAS/Shunts: The atrial septum is grossly normal.  LEFT VENTRICLE PLAX 2D LVIDd:         3.70 cm  Diastology LVIDs:         2.10 cm  LV e' medial:    4.46 cm/s LV PW:         1.20 cm  LV E/e' medial:  17.7 LV IVS:        1.10 cm  LV e' lateral:   4.68 cm/s LVOT diam:     1.90 cm  LV E/e' lateral: 16.9 LV SV:         52 LV SV Index:   31 LVOT Area:     2.84 cm  RIGHT VENTRICLE             IVC RV Basal diam:  2.50 cm     IVC diam: 2.20 cm RV S prime:     10.40 cm/s TAPSE (M-mode): 1.8 cm LEFT ATRIUM             Index       RIGHT ATRIUM  Index LA diam:        4.20 cm 2.48 cm/m  RA Area:     9.54 cm LA Vol (A2C):   66.9 ml 39.45 ml/m RA Volume:   19.60 ml 11.56 ml/m LA Vol (A4C):   25.8 ml 15.21 ml/m LA Biplane Vol: 42.6 ml 25.12 ml/m  AORTIC VALVE LVOT Vmax:   97.00 cm/s LVOT Vmean:  66.700 cm/s LVOT VTI:    0.185 m  AORTA Ao Root diam: 2.80 cm Ao Asc diam:  2.70 cm MITRAL VALVE MV Area (PHT): 5.13  cm     SHUNTS MV Decel Time: 148 msec     Systemic VTI:  0.18 m MV E velocity: 79.00 cm/s   Systemic Diam: 1.90 cm MV A velocity: 138.00 cm/s MV E/A ratio:  0.57 Dixie Dials MD Electronically signed by Dixie Dials MD Signature Date/Time: 08/06/2020/12:02:39 PM    Final    ECHOCARDIOGRAM LIMITED  Result Date: 08/15/2020    ECHOCARDIOGRAM LIMITED REPORT   Patient Name:   [name] Date of Exam: 08/15/2020 Medical Rec #:  [id number]      Height:       64.0 in Accession #:    [id number]     Weight:       134.9 lb Date of Birth:  12/11/1945      BSA:          1.655 m Patient Age:    39 years       BP:           135/55 mmHg Patient Gender: F              HR:           88 bpm. Exam Location:  Inpatient Procedure: Limited Echo and Limited Color Doppler Indications:    I31.3 Pericardial effusion (noninflammatory)  History:        Patient has prior history of Echocardiogram examinations, most                 recent 08/10/2020. Signs/Symptoms:Dyspnea; Risk                 Factors:Hypertension and Dyslipidemia. Cancer. Pericarditis.  Sonographer:    Jonelle Sidle Dance Referring Phys: [place]  1. Left ventricular ejection fraction, by estimation, is 55 to 60%. The left ventricle has normal function. The left ventricle has no regional wall motion abnormalities. There is mild concentric left ventricular hypertrophy of the anterior, septal and posterior segments. Left ventricular diastolic function could not be evaluated.  2. Right ventricular systolic function is normal. The right ventricular size is normal.  3. Left atrial size was moderately dilated.  4. A small pericardial effusion is present. The pericardial effusion is circumferential. There is no evidence of cardiac tamponade.  5. The mitral valve is degenerative. Mild mitral valve regurgitation.  6. The aortic valve is tricuspid. There is mild calcification of the aortic valve. There is mild thickening of the aortic valve. Aortic valve regurgitation  is not visualized. Mild aortic valve sclerosis is present, with no evidence of aortic valve stenosis.  7. There is mild (Grade II) atheroma plaque involving the ascending aorta.  8. The inferior vena cava is dilated in size with >50% respiratory variability, suggesting right atrial pressure of 8 mmHg. FINDINGS  Left Ventricle: Left ventricular ejection fraction, by estimation, is 55 to 60%. The left ventricle has normal function. The left ventricle has no regional wall motion abnormalities. The left ventricular internal cavity  size was normal in size. There is  mild concentric left ventricular hypertrophy of the anterior, septal and posterior segments. Left ventricular diastolic function could not be evaluated. Right Ventricle: The right ventricular size is normal. No increase in right ventricular wall thickness. Right ventricular systolic function is normal. Left Atrium: Left atrial size was moderately dilated. Right Atrium: Right atrial size was normal in size. Pericardium: A small pericardial effusion is present. The pericardial effusion is circumferential. The pericardial effusion appears to contain fibrous material. There is no evidence of cardiac tamponade. Mitral Valve: The mitral valve is degenerative in appearance. Mild mitral valve regurgitation. Tricuspid Valve: The tricuspid valve is normal in structure. Tricuspid valve regurgitation is mild. Aortic Valve: The aortic valve is tricuspid. There is mild calcification of the aortic valve. There is mild thickening of the aortic valve. There is mild aortic valve annular calcification. Aortic valve regurgitation is not visualized. Mild aortic valve sclerosis is present, with no evidence of aortic valve stenosis. Pulmonic Valve: The pulmonic valve was normal in structure. Pulmonic valve regurgitation is not visualized. Aorta: The aortic root is normal in size and structure. There is mild (Grade II) atheroma plaque involving the ascending aorta. Venous: The  inferior vena cava is dilated in size with greater than 50% respiratory variability, suggesting right atrial pressure of 8 mmHg. IAS/Shunts: The interatrial septum was not assessed. Additional Comments: There is a small pleural effusion in both left and right lateral regions. LEFT VENTRICLE PLAX 2D LVIDd:         3.00 cm LVIDs:         2.20 cm LV PW:         1.30 cm LV IVS:        1.20 cm LVOT diam:     1.50 cm LVOT Area:     1.77 cm  IVC IVC diam: 2.10 cm LEFT ATRIUM         Index LA diam:    3.70 cm 2.24 cm/m   AORTA Ao Root diam: 3.00 cm Ao Asc diam:  2.50 cm  SHUNTS Systemic Diam: 1.50 cm Dixie Dials MD Electronically signed by Dixie Dials MD Signature Date/Time: 08/15/2020/4:58:15 PM    Final    ECHOCARDIOGRAM LIMITED  Result Date: 08/10/2020    ECHOCARDIOGRAM LIMITED REPORT   Patient Name:   [name] Date of Exam: 08/10/2020 Medical Rec #:  [id number]      Height:       64.0 in Accession #:    [id number]     Weight:       138.4 lb Date of Birth:  1945-04-05      BSA:          1.673 m Patient Age:    59 years       BP:           129/56 mmHg Patient Gender: F              HR:           89 bpm. Exam Location:  Inpatient Procedure: 2D Echo, Color Doppler and Cardiac Doppler Indications:     Pericardial effusion  History:         Patient has prior history of Echocardiogram examinations, most                  recent 08/08/2020. Signs/Symptoms:Shortness of Breath. Cancer.                  Pericarditis.  Sonographer:  Clayton Lefort RDCS (AE) Referring Phys:  [place] Diagnosing Phys: Dixie Dials MD IMPRESSIONS  1. Left ventricular ejection fraction, by estimation, is 60 to 65%. The left ventricle has normal function. The left ventricle has no regional wall motion abnormalities. There is moderate left ventricular hypertrophy. Left ventricular diastolic function  could not be evaluated.  2. Right ventricular systolic function is normal. The right ventricular size is normal.  3. Left atrial size was  moderately dilated.  4. Right atrial size was moderately dilated.  5. A small pericardial effusion is present. The pericardial effusion is localized near the right ventricle, localized near the right atrium and surrounding the apex. There is no evidence of cardiac tamponade.  6. The mitral valve is degenerative. Mild mitral valve regurgitation.  7. The aortic valve is tricuspid. There is mild calcification of the aortic valve. There is mild thickening of the aortic valve. Mild to moderate aortic valve sclerosis/calcification is present, without any evidence of aortic stenosis.  8. There is mild (Grade II) atheroma plaque involving the aortic root.  9. The inferior vena cava is normal in size with <50% respiratory variability, suggesting right atrial pressure of 8 mmHg. FINDINGS  Left Ventricle: Left ventricular ejection fraction, by estimation, is 60 to 65%. The left ventricle has normal function. The left ventricle has no regional wall motion abnormalities. There is moderate left ventricular hypertrophy. Left ventricular diastolic function could not be evaluated. Right Ventricle: The right ventricular size is normal. No increase in right ventricular wall thickness. Right ventricular systolic function is normal. Left Atrium: Left atrial size was moderately dilated. Right Atrium: Right atrial size was moderately dilated. Pericardium: A small pericardial effusion is present. The pericardial effusion is localized near the right ventricle, localized near the right atrium and surrounding the apex. There is no evidence of cardiac tamponade. Mitral Valve: The mitral valve is degenerative in appearance. There is mild thickening of the mitral valve leaflet(s). There is mild calcification of the mitral valve leaflet(s). Mild mitral annular calcification. Mild mitral valve regurgitation. Tricuspid Valve: The tricuspid valve is normal in structure. Tricuspid valve regurgitation is mild. Aortic Valve: The aortic valve is  tricuspid. There is mild calcification of the aortic valve. There is mild thickening of the aortic valve. There is mild aortic valve annular calcification. Mild to moderate aortic valve sclerosis/calcification is present, without any evidence of aortic stenosis. Pulmonic Valve: The pulmonic valve was normal in structure. Aorta: The aortic root is normal in size and structure. There is mild (Grade II) atheroma plaque involving the aortic root. Venous: The inferior vena cava is normal in size with less than 50% respiratory variability, suggesting right atrial pressure of 8 mmHg. IAS/Shunts: The interatrial septum was not assessed. Dixie Dials MD Electronically signed by Dixie Dials MD Signature Date/Time: 08/10/2020/1:26:34 PM    Final    ECHOCARDIOGRAM LIMITED  Result Date: 08/08/2020    ECHOCARDIOGRAM LIMITED REPORT   Patient Name:   PRISCELLA DONNA Date of Exam: 08/08/2020 Medical Rec #:  [id number]      Height:       64.0 in Accession #:    [id number]     Weight:       133.3 lb Date of Birth:  [birth year]      BSA:          1.647 m Patient Age:    42 years       BP:           116/47 mmHg Patient  Gender: F              HR:           85 bpm. Exam Location:  Inpatient Procedure: Limited Echo Indications:     Pericardial effusion post pericardial centesis  History:         Patient has prior history of Echocardiogram examinations, most                  recent 08/06/2020. Signs/Symptoms:Shortness of Breath. Cancer.                  Pericarditis.  Sonographer:     [name] RDCS Referring Phys:  [place] Diagnosing Phys: Dixie Dials MD IMPRESSIONS  1. Left ventricular ejection fraction, by estimation, is 65 to 70%. The left ventricle has normal function. The left ventricle has no regional wall motion abnormalities. There is mild concentric left ventricular hypertrophy.  2. Right ventricular systolic function is normal.  3. Left atrial size was mildly dilated.  4. Right atrial size was mildly dilated.  5.  Sucessful pericardiocentesis.  6. The mitral valve is degenerative.  7. The aortic valve is tricuspid. There is mild calcification of the aortic valve. There is mild thickening of the aortic valve.  8. There is mild (Grade II) atheroma plaque involving the ascending aorta. FINDINGS  Left Ventricle: Left ventricular ejection fraction, by estimation, is 65 to 70%. The left ventricle has normal function. The left ventricle has no regional wall motion abnormalities. There is mild concentric left ventricular hypertrophy. Right Ventricle: Right ventricular systolic function is normal. Left Atrium: Left atrial size was mildly dilated. Right Atrium: Right atrial size was mildly dilated. Pericardium: Sucessful pericardiocentesis. There is no evidence of pericardial effusion. Mitral Valve: The mitral valve is degenerative in appearance. Tricuspid Valve: The tricuspid valve is normal in structure. Aortic Valve: The aortic valve is tricuspid. There is mild calcification of the aortic valve. There is mild thickening of the aortic valve. There is mild aortic valve annular calcification. Pulmonic Valve: The pulmonic valve was normal in structure. Aorta: The aortic root is normal in size and structure. There is mild (Grade II) atheroma plaque involving the ascending aorta. Venous: The inferior vena cava was not well visualized. Dixie Dials MD Electronically signed by Dixie Dials MD Signature Date/Time: 08/08/2020/3:22:53 PM    Final         [name], MD 08/31/2020 9:36 PM

## 2020-08-27 NOTE — ED Notes (Signed)
Respiratory contacted for BIPAP initiation

## 2020-08-28 ENCOUNTER — Inpatient Hospital Stay (HOSPITAL_COMMUNITY): Payer: Medicare Other

## 2020-08-28 ENCOUNTER — Inpatient Hospital Stay: Payer: Self-pay | Admitting: Hematology and Oncology

## 2020-08-28 DIAGNOSIS — J9622 Acute and chronic respiratory failure with hypercapnia: Secondary | ICD-10-CM | POA: Diagnosis present

## 2020-08-28 DIAGNOSIS — Z9981 Dependence on supplemental oxygen: Secondary | ICD-10-CM | POA: Diagnosis not present

## 2020-08-28 DIAGNOSIS — Z66 Do not resuscitate: Secondary | ICD-10-CM | POA: Diagnosis present

## 2020-08-28 DIAGNOSIS — J910 Malignant pleural effusion: Secondary | ICD-10-CM | POA: Diagnosis present

## 2020-08-28 DIAGNOSIS — J9621 Acute and chronic respiratory failure with hypoxia: Secondary | ICD-10-CM | POA: Diagnosis present

## 2020-08-28 DIAGNOSIS — C3492 Malignant neoplasm of unspecified part of left bronchus or lung: Secondary | ICD-10-CM | POA: Diagnosis present

## 2020-08-28 DIAGNOSIS — R55 Syncope and collapse: Secondary | ICD-10-CM | POA: Diagnosis present

## 2020-08-28 DIAGNOSIS — I5031 Acute diastolic (congestive) heart failure: Secondary | ICD-10-CM | POA: Diagnosis present

## 2020-08-28 DIAGNOSIS — E872 Acidosis: Secondary | ICD-10-CM | POA: Diagnosis present

## 2020-08-28 DIAGNOSIS — Z515 Encounter for palliative care: Secondary | ICD-10-CM

## 2020-08-28 DIAGNOSIS — E119 Type 2 diabetes mellitus without complications: Secondary | ICD-10-CM | POA: Diagnosis present

## 2020-08-28 DIAGNOSIS — Z681 Body mass index (BMI) 19 or less, adult: Secondary | ICD-10-CM | POA: Diagnosis not present

## 2020-08-28 DIAGNOSIS — C7951 Secondary malignant neoplasm of bone: Secondary | ICD-10-CM | POA: Diagnosis present

## 2020-08-28 DIAGNOSIS — E876 Hypokalemia: Secondary | ICD-10-CM | POA: Diagnosis not present

## 2020-08-28 DIAGNOSIS — J9601 Acute respiratory failure with hypoxia: Secondary | ICD-10-CM | POA: Diagnosis present

## 2020-08-28 DIAGNOSIS — E43 Unspecified severe protein-calorie malnutrition: Secondary | ICD-10-CM | POA: Diagnosis present

## 2020-08-28 DIAGNOSIS — J90 Pleural effusion, not elsewhere classified: Secondary | ICD-10-CM

## 2020-08-28 DIAGNOSIS — I313 Pericardial effusion (noninflammatory): Secondary | ICD-10-CM

## 2020-08-28 DIAGNOSIS — I110 Hypertensive heart disease with heart failure: Secondary | ICD-10-CM | POA: Diagnosis present

## 2020-08-28 DIAGNOSIS — M1990 Unspecified osteoarthritis, unspecified site: Secondary | ICD-10-CM | POA: Diagnosis present

## 2020-08-28 DIAGNOSIS — J811 Chronic pulmonary edema: Secondary | ICD-10-CM | POA: Diagnosis present

## 2020-08-28 DIAGNOSIS — J810 Acute pulmonary edema: Secondary | ICD-10-CM

## 2020-08-28 DIAGNOSIS — R7989 Other specified abnormal findings of blood chemistry: Secondary | ICD-10-CM | POA: Diagnosis present

## 2020-08-28 DIAGNOSIS — R54 Age-related physical debility: Secondary | ICD-10-CM | POA: Diagnosis present

## 2020-08-28 DIAGNOSIS — J9602 Acute respiratory failure with hypercapnia: Secondary | ICD-10-CM

## 2020-08-28 DIAGNOSIS — Z7189 Other specified counseling: Secondary | ICD-10-CM

## 2020-08-28 DIAGNOSIS — C7970 Secondary malignant neoplasm of unspecified adrenal gland: Secondary | ICD-10-CM | POA: Diagnosis present

## 2020-08-28 DIAGNOSIS — Z79899 Other long term (current) drug therapy: Secondary | ICD-10-CM | POA: Diagnosis not present

## 2020-08-28 DIAGNOSIS — Z20822 Contact with and (suspected) exposure to covid-19: Secondary | ICD-10-CM | POA: Diagnosis present

## 2020-08-28 LAB — BLOOD GAS, ARTERIAL
Acid-Base Excess: 2.2 mmol/L — ABNORMAL HIGH (ref 0.0–2.0)
Acid-Base Excess: 6.1 mmol/L — ABNORMAL HIGH (ref 0.0–2.0)
Bicarbonate: 28.9 mmol/L — ABNORMAL HIGH (ref 20.0–28.0)
Bicarbonate: 31.2 mmol/L — ABNORMAL HIGH (ref 20.0–28.0)
FIO2: 40
FIO2: 45
O2 Saturation: 88.8 %
O2 Saturation: 89.8 %
Patient temperature: 97
Patient temperature: 98.6
pCO2 arterial: 49.3 mmHg — ABNORMAL HIGH (ref 32.0–48.0)
pCO2 arterial: 55.2 mmHg — ABNORMAL HIGH (ref 32.0–48.0)
pH, Arterial: 7.333 — ABNORMAL LOW (ref 7.350–7.450)
pH, Arterial: 7.418 (ref 7.350–7.450)
pO2, Arterial: 56.2 mmHg — ABNORMAL LOW (ref 83.0–108.0)
pO2, Arterial: 57.4 mmHg — ABNORMAL LOW (ref 83.0–108.0)

## 2020-08-28 LAB — BASIC METABOLIC PANEL
Anion gap: 12 (ref 5–15)
BUN: 17 mg/dL (ref 8–23)
CO2: 30 mmol/L (ref 22–32)
Calcium: 9.4 mg/dL (ref 8.9–10.3)
Chloride: 100 mmol/L (ref 98–111)
Creatinine, Ser: 0.59 mg/dL (ref 0.44–1.00)
GFR, Estimated: >60 mL/min (ref >60)
Glucose, Bld: 152 mg/dL — ABNORMAL HIGH (ref 70–99)
Potassium: 3.4 mmol/L — ABNORMAL LOW (ref 3.5–5.1)
Sodium: 142 mmol/L (ref 135–145)

## 2020-08-28 LAB — HEMOGLOBIN A1C
Hgb A1c MFr Bld: 6.2 % — ABNORMAL HIGH (ref 4.8–5.6)
Mean Plasma Glucose: 131.24 mg/dL

## 2020-08-28 LAB — GLUCOSE, CAPILLARY
Glucose-Capillary: 143 mg/dL — ABNORMAL HIGH (ref 70–99)
Glucose-Capillary: 78 mg/dL (ref 70–99)
Glucose-Capillary: 184 mg/dL — ABNORMAL HIGH (ref 70–99)
Glucose-Capillary: 104 mg/dL — ABNORMAL HIGH (ref 70–99)
Glucose-Capillary: 100 mg/dL — ABNORMAL HIGH (ref 70–99)
Glucose-Capillary: 91 mg/dL (ref 70–99)

## 2020-08-28 LAB — PROTIME-INR
INR: 1.1 (ref 0.8–1.2)
Prothrombin Time: 14.6 seconds (ref 11.4–15.2)

## 2020-08-28 LAB — APTT: aPTT: 30 seconds (ref 24–36)

## 2020-08-28 LAB — MAGNESIUM: Magnesium: 1.8 mg/dL (ref 1.7–2.4)

## 2020-08-28 LAB — MRSA NEXT GEN BY PCR, NASAL: MRSA by PCR Next Gen: NOT DETECTED

## 2020-08-28 LAB — TROPONIN I (HIGH SENSITIVITY): Troponin I (High Sensitivity): 17 ng/L (ref <18)

## 2020-08-28 MED ORDER — DOCUSATE SODIUM 100 MG PO CAPS: 100.0000 mg | ORAL_CAPSULE | Freq: Two times a day (BID) | ORAL | Status: DC | PRN

## 2020-08-28 MED ORDER — CHLORHEXIDINE GLUCONATE CLOTH 2 % EX PADS
6.0000 | MEDICATED_PAD | Freq: Every day | CUTANEOUS | Status: DC
  Administered 2020-08-28 – 2020-08-29 (×2): 6 via TOPICAL

## 2020-08-28 MED ORDER — ACETAMINOPHEN 325 MG PO TABS: 650.0000 mg | ORAL_TABLET | ORAL | Status: DC | PRN

## 2020-08-28 MED ORDER — IPRATROPIUM-ALBUTEROL 0.5-2.5 (3) MG/3ML IN SOLN
3.0000 mL | Freq: Four times a day (QID) | RESPIRATORY_TRACT | Status: DC
  Administered 2020-08-28 – 2020-08-29 (×6): 3 mL via RESPIRATORY_TRACT
  Filled 2020-08-28 (×7): qty 3

## 2020-08-28 MED ORDER — HYDRALAZINE HCL 25 MG PO TABS
25.0000 mg | ORAL_TABLET | Freq: Three times a day (TID) | ORAL | Status: DC
  Administered 2020-08-28 – 2020-08-29 (×2): 25 mg via ORAL
  Filled 2020-08-28 (×2): qty 1

## 2020-08-28 MED ORDER — ORAL CARE MOUTH RINSE
15.0000 mL | Freq: Two times a day (BID) | OROMUCOSAL | Status: DC
  Administered 2020-08-28 (×2): 15 mL via OROMUCOSAL

## 2020-08-28 MED ORDER — CHLORHEXIDINE GLUCONATE 0.12 % MT SOLN
15.0000 mL | Freq: Two times a day (BID) | OROMUCOSAL | Status: DC
  Administered 2020-08-28 – 2020-08-29 (×3): 15 mL via OROMUCOSAL
  Filled 2020-08-28 (×2): qty 15

## 2020-08-28 MED ORDER — MAGNESIUM SULFATE 2 GM/50ML IV SOLN
2.0000 g | Freq: Once | INTRAVENOUS | Status: AC
Start: 2020-08-28 — End: 2020-08-28
  Administered 2020-08-28: 2 g via INTRAVENOUS
  Filled 2020-08-28: qty 50

## 2020-08-28 MED ORDER — FUROSEMIDE 10 MG/ML IJ SOLN
20.0000 mg | Freq: Four times a day (QID) | INTRAMUSCULAR | Status: AC
  Administered 2020-08-28 (×2): 20 mg via INTRAVENOUS
  Filled 2020-08-28 (×2): qty 2

## 2020-08-28 MED ORDER — INSULIN ASPART 100 UNIT/ML IJ SOLN
0.0000 [IU] | INTRAMUSCULAR | Status: DC
  Administered 2020-08-28: 1 [IU] via SUBCUTANEOUS
  Administered 2020-08-28: 2 [IU] via SUBCUTANEOUS
  Filled 2020-08-28: qty 0.09

## 2020-08-28 MED ORDER — HEPARIN SODIUM (PORCINE) 5000 UNIT/ML IJ SOLN
5000.0000 [IU] | Freq: Three times a day (TID) | INTRAMUSCULAR | Status: DC
  Administered 2020-08-28 – 2020-08-29 (×3): 5000 [IU] via SUBCUTANEOUS
  Filled 2020-08-28 (×3): qty 1

## 2020-08-28 MED ORDER — COLCHICINE 0.6 MG PO TABS
0.6000 mg | ORAL_TABLET | Freq: Every day | ORAL | Status: DC
  Administered 2020-08-28 – 2020-08-29 (×2): 0.6 mg via ORAL
  Filled 2020-08-28 (×2): qty 1

## 2020-08-28 MED ORDER — AMLODIPINE BESYLATE 5 MG PO TABS
5.0000 mg | ORAL_TABLET | Freq: Every day | ORAL | Status: DC
  Administered 2020-08-28 – 2020-08-29 (×2): 5 mg via ORAL
  Filled 2020-08-28 (×2): qty 1

## 2020-08-28 MED ORDER — FAMOTIDINE IN NACL 20-0.9 MG/50ML-% IV SOLN
20.0000 mg | Freq: Two times a day (BID) | INTRAVENOUS | Status: DC
  Administered 2020-08-28 (×2): 20 mg via INTRAVENOUS
  Filled 2020-08-28 (×3): qty 50

## 2020-08-28 MED ORDER — METOPROLOL TARTRATE 25 MG PO TABS
25.0000 mg | ORAL_TABLET | Freq: Two times a day (BID) | ORAL | Status: DC
  Administered 2020-08-28 – 2020-08-29 (×2): 25 mg via ORAL
  Filled 2020-08-28 (×2): qty 1

## 2020-08-28 MED ORDER — POLYETHYLENE GLYCOL 3350 17 G PO PACK: 17.0000 g | PACK | Freq: Every day | ORAL | Status: DC | PRN

## 2020-08-28 MED ORDER — ONDANSETRON HCL 4 MG/2ML IJ SOLN: 4.0000 mg | Freq: Four times a day (QID) | INTRAMUSCULAR | Status: DC | PRN

## 2020-08-28 MED ORDER — ALBUTEROL SULFATE (2.5 MG/3ML) 0.083% IN NEBU: 2.5000 mg | INHALATION_SOLUTION | RESPIRATORY_TRACT | Status: DC | PRN

## 2020-08-28 MED ORDER — SODIUM CHLORIDE 0.9% FLUSH
10.0000 mL | Freq: Three times a day (TID) | INTRAVENOUS | Status: DC
  Administered 2020-08-28 – 2020-08-29 (×4): 10 mL

## 2020-08-28 MED ORDER — POTASSIUM CHLORIDE CRYS ER 20 MEQ PO TBCR: 40.0000 meq | EXTENDED_RELEASE_TABLET | Freq: Once | ORAL | Status: DC

## 2020-08-28 NOTE — Progress Notes (Signed)
I introduced spiritual care services to Sara Ferguson.  She was not feeling up to talking at the moment.  We will attempt follow up at a different time, but please page as needs arise.  [address], [place] Pager, [phone]

## 2020-08-28 NOTE — Procedures (Signed)
Insertion of Chest Tube Procedure Note  Jeananne Bedwell  [id number]  May 13, 1945  Date:08/28/20  Time:9:51 AM    Provider Performing: Roselie Awkward   Procedure: Chest Tube Insertion (48546)  Indication(s) Effusion  Consent Risks of the procedure as well as the alternatives and risks of each were explained to the patient and/or caregiver.  Consent for the procedure was obtained and is signed in the bedside chart  Anesthesia Topical only with 1% lidocaine    Time Out Verified patient identification, verified procedure, site/side was marked, verified correct patient position, special equipment/implants available, medications/allergies/relevant history reviewed, required imaging and test results available.   Sterile Technique Maximal sterile technique including full sterile barrier drape, hand hygiene, sterile gown, sterile gloves, mask, hair covering, sterile ultrasound probe cover (if used).   Procedure Description Ultrasound used to identify appropriate pleural anatomy for placement and overlying skin marked. Area of placement cleaned and draped in sterile fashion.  A 14 French pigtail pleural catheter was placed into the right pleural space using Seldinger technique. Appropriate return of fluid was obtained.  The tube was connected to atrium and placed on -20 cm H2O wall suction.   Complications/Tolerance None; patient tolerated the procedure well. Chest X-ray is ordered to verify placement.   EBL Minimal  Specimen(s) fluid > sent for cytology  Roselie Awkward, MD [place] [facility] Pager: [phone] Cell: [phone] After 7:00 pm call Elink  [phone]

## 2020-08-28 NOTE — Progress Notes (Signed)
[facility] ADULT ICU REPLACEMENT PROTOCOL   The patient does apply for the [facility] Adult ICU Electrolyte Replacment Protocol based on the criteria listed below:   1. Is GFR >/= 30 ml/min? Yes.    Patient's GFR today is >60 2. Is SCr </= 2? Yes.   Patient's SCr is 0.59 ml/kg/hr 3. Did SCr increase >/= 0.5 in 24 hours? No. 4. Abnormal electrolyte(s):   K 3.4, Mg 1.8 5. Ordered repletion with: protocol 6. If a panic level lab has been reported, has the CCM MD in charge been notified?  Physician:  Lowella Bandy R Jaquelyn Sakamoto 08/28/2020 4:17 AM

## 2020-08-28 NOTE — ED Notes (Signed)
ED TO INPATIENT HANDOFF REPORT  Name/Age/Gender Sara Ferguson 75 y.o. female  Code Status    Code Status Orders  (From admission, onward)         Start     Ordered   08/28/20 0019  Do not attempt resuscitation (DNR)  Continuous       Question Answer Comment  In the event of cardiac or respiratory ARREST Do not call a "code blue"   In the event of cardiac or respiratory ARREST Do not perform Intubation, CPR, defibrillation or ACLS   In the event of cardiac or respiratory ARREST Use medication by any route, position, wound care, and other measures to relive pain and suffering. May use oxygen, suction and manual treatment of airway obstruction as needed for comfort.      08/28/20 0019        Code Status History    Date Active Date Inactive Code Status Order ID Comments User Context   08/28/2020 0014 08/28/2020 0019 Full Code [id number]  Renee Pain, MD ED   08/05/2020 2221 08/17/2020 2041 Full Code [id number]  Elwyn Reach, MD ED   01/25/2020 1436 01/28/2020 2212 Full Code [id number]  Harold Hedge, MD ED   12/31/2019 0217 01/05/2020 1611 Full Code [id number]  Elgergawy, Silver Huguenin, MD Inpatient    Advance Directive Documentation   Flowsheet Row Most Recent Value  Type of Advance Directive Healthcare Power of Attorney  Pre-existing out of facility DNR order (yellow form or pink MOST form) --  "MOST" Form in Place? --      Home/SNF/Other Home  Chief Complaint Acute hypoxemic respiratory failure ([place]) [J96.01]  Level of Care/Admitting Diagnosis ED Disposition    ED Disposition  Admit   Condition  --   [facility]: [facility] [[id number]]  Level of Care: ICU [6]  May admit patient to [facility] or [facility] if equivalent level of care is available:: No  Covid Evaluation: Confirmed COVID Negative  Diagnosis: Acute hypoxemic respiratory failure [facility]) [[id number]]  Admitting Physician: Renee Pain [[id number]]  Attending Physician: Renee Pain [[id number]]  Estimated length of stay: 3 - 4 days  Certification:: I certify this patient will need inpatient services for at least 2 midnights         Medical History Past Medical History:  Diagnosis Date  . Arthritis   . Diabetes mellitus without complication ([place])   . Hypertension   . Non-small cell lung cancer ([place])    Dx 12/30/19 by thoracentesis     Allergies No Known Allergies  IV Location/Drains/Wounds Patient Lines/Drains/Airways Status    Active Line/Drains/Airways    Name Placement date Placement time Site Days   Peripheral IV 08/18/2020 22 G Anterior;Left Hand 08/25/2020  1820  Hand  1          Labs/Imaging Results for orders placed or performed during the hospital encounter of 08/24/2020 (from the past 48 hour(s))  Blood gas, venous (at [facility] and [facility], not at [facility])     Status: Abnormal   Collection Time: 09/10/2020  9:42 PM  Result Value Ref Range   pH, Ven 7.188 (LL) 7.250 - 7.430    Comment: CRITICAL RESULT CALLED TO, READ BACK BY AND VERIFIED WITH: RN N [facility] AT 2207 09/15/2020 [place] A    pCO2, Ven 80.8 (HH) 44.0 - 60.0 mmHg    Comment: CRITICAL RESULT CALLED TO, READ BACK BY AND VERIFIED WITH: RN N [facility] AT 2207 08/28/2020 [place] A  pO2, Ven 88.5 (H) 32.0 - 45.0 mmHg   Bicarbonate 29.5 (H) 20.0 - 28.0 mmol/L   Acid-base deficit 0.5 0.0 - 2.0 mmol/L   O2 Saturation 94.2 %   Patient temperature 98.6     Comment: Performed at [facility], [place] [address], [place], [place] [address]  Basic metabolic panel     Status: Abnormal   Collection Time: 08/24/2020  9:42 PM  Result Value Ref Range   Sodium 141 135 - 145 mmol/L   Potassium 3.5 3.5 - 5.1 mmol/L   Chloride 99 98 - 111 mmol/L   CO2 30 22 - 32 mmol/L   Glucose, Bld 208 (H) 70 - 99 mg/dL    Comment: Glucose reference range applies only to samples taken after fasting for at least 8 hours.   BUN 16 8 - 23 mg/dL   Creatinine, Ser 0.71 0.44 - 1.00 mg/dL   Calcium 9.4 8.9 -  10.3 mg/dL   GFR, Estimated >60 >60 mL/min    Comment: (NOTE) Calculated using the CKD-EPI Creatinine Equation (2021)    Anion gap 12 5 - 15    Comment: Performed at [facility], [place] [address], [place], [place] [address]  CBC with Differential     Status: Abnormal   Collection Time: 08/19/2020  9:42 PM  Result Value Ref Range   WBC 11.2 (H) 4.0 - 10.5 K/uL   RBC 4.26 3.87 - 5.11 MIL/uL   Hemoglobin 12.0 12.0 - 15.0 g/dL   HCT 39.4 36.0 - 46.0 %   MCV 92.5 80.0 - 100.0 fL   MCH 28.2 26.0 - 34.0 pg   MCHC 30.5 30.0 - 36.0 g/dL   RDW 13.0 11.5 - 15.5 %   Platelets 389 150 - 400 K/uL   nRBC 0.0 0.0 - 0.2 %   Neutrophils Relative % 87 %   Neutro Abs 9.8 (H) 1.7 - 7.7 K/uL   Lymphocytes Relative 6 %   Lymphs Abs 0.6 (L) 0.7 - 4.0 K/uL   Monocytes Relative 6 %   Monocytes Absolute 0.7 0.1 - 1.0 K/uL   Eosinophils Relative 0 %   Eosinophils Absolute 0.0 0.0 - 0.5 K/uL   Basophils Relative 0 %   Basophils Absolute 0.0 0.0 - 0.1 K/uL   Immature Granulocytes 1 %   Abs Immature Granulocytes 0.09 (H) 0.00 - 0.07 K/uL    Comment: Performed at [facility], [place] [address] [place], [place] [address]  Troponin I (High Sensitivity)     Status: None   Collection Time: 09/16/2020  9:42 PM  Result Value Ref Range   Troponin I (High Sensitivity) 13 <18 ng/L    Comment: (NOTE) Elevated high sensitivity troponin I (hsTnI) values and significant  changes across serial measurements may suggest ACS but many other  chronic and acute conditions are known to elevate hsTnI results.  Refer to the "Links" section for chest pain algorithms and additional  guidance. Performed at [facility], [place] [address], [place], [place] [address]   Brain natriuretic peptide     Status: Abnormal   Collection Time: 09/12/2020  9:42 PM  Result Value Ref Range   B Natriuretic Peptide 201.1 (H) 0.0 - 100.0 pg/mL    Comment: Performed at [facility], [place] [address] [place], [place] [address]  Resp Panel by RT-PCR (Flu A&B, Covid) Nasopharyngeal Swab     Status: None   Collection Time: 08/22/2020  9:42 PM  Specimen: Nasopharyngeal Swab; Nasopharyngeal(NP) swabs in vial transport medium  Result Value Ref Range   SARS Coronavirus 2 by RT PCR NEGATIVE NEGATIVE    Comment: (NOTE) SARS-CoV-2 target nucleic acids are NOT DETECTED.  The SARS-CoV-2 RNA is generally detectable in upper respiratory specimens during the acute phase of infection. The lowest concentration of SARS-CoV-2 viral copies this assay can detect is 138 copies/mL. A negative result does not preclude SARS-Cov-2 infection and should not be used as the sole basis for treatment or other patient management decisions. A negative result may occur with  improper specimen collection/handling, submission of specimen other than nasopharyngeal swab, presence of viral mutation(s) within the areas targeted by this assay, and inadequate number of viral copies(<138 copies/mL). A negative result must be combined with clinical observations, patient history, and epidemiological information. The expected result is Negative.  Fact Sheet for Patients:  EntrepreneurPulse.com.au  Fact Sheet for Healthcare Providers:  IncredibleEmployment.be  This test is no t yet approved or cleared by the [place] FDA and  has been authorized for detection and/or diagnosis of SARS-CoV-2 by FDA under an Emergency Use Authorization (EUA). This EUA will remain  in effect (meaning this test can be used) for the duration of the COVID-19 declaration under Section 564(b)(1) of the Act, 21 U.S.C.section 360bbb-3(b)(1), unless the authorization is terminated  or revoked sooner.       Influenza A by PCR NEGATIVE NEGATIVE   Influenza B by PCR NEGATIVE NEGATIVE    Comment: (NOTE) The Xpert Xpress SARS-CoV-2/FLU/RSV plus assay is intended as an aid in the  diagnosis of influenza from Nasopharyngeal swab specimens and should not be used as a sole basis for treatment. Nasal washings and aspirates are unacceptable for Xpert Xpress SARS-CoV-2/FLU/RSV testing.  Fact Sheet for Patients: EntrepreneurPulse.com.au  Fact Sheet for Healthcare Providers: IncredibleEmployment.be  This test is not yet approved or cleared by the [place] FDA and has been authorized for detection and/or diagnosis of SARS-CoV-2 by FDA under an Emergency Use Authorization (EUA). This EUA will remain in effect (meaning this test can be used) for the duration of the COVID-19 declaration under Section 564(b)(1) of the Act, 21 U.S.C. section 360bbb-3(b)(1), unless the authorization is terminated or revoked.  Performed at [facility], [place] [address], [place], [place] [address]   Blood gas, arterial     Status: Abnormal   Collection Time: 09/04/2020 11:58 PM  Result Value Ref Range   FIO2 45.00    pH, Arterial 7.333 (L) 7.350 - 7.450   pCO2 arterial 55.2 (H) 32.0 - 48.0 mmHg   pO2, Arterial 57.4 (L) 83.0 - 108.0 mmHg   Bicarbonate 28.9 (H) 20.0 - 28.0 mmol/L   Acid-Base Excess 2.2 (H) 0.0 - 2.0 mmol/L   O2 Saturation 88.8 %   Patient temperature 97.0    Allens test (pass/fail) PASS PASS    Comment: Performed at [facility], [place] [address], [place], [place] [address]  Troponin I (High Sensitivity)     Status: None   Collection Time: 08/28/20 12:00 AM  Result Value Ref Range   Troponin I (High Sensitivity) 17 <18 ng/L    Comment: (NOTE) Elevated high sensitivity troponin I (hsTnI) values and significant  changes across serial measurements may suggest ACS but many other  chronic and acute conditions are known to elevate hsTnI results.  Refer to the "Links" section for chest pain algorithms and additional  guidance. Performed at [facility], [place] [address]  [place], [place] [address]    DG Chest Portable 1 View  Result Date: 08/17/2020 CLINICAL DATA:  Shortness of breath EXAM: PORTABLE CHEST 1 VIEW COMPARISON:  08/11/2020 FINDINGS: Cardiac shadow is enlarged but stable. Aortic calcifications are noted. Changes of increased vascular congestion and edema are noted. Additionally effusions are seen left greater than right. The right effusion is new from the prior exam. The left effusion is stable. No bony abnormality is noted. IMPRESSION: Increasing changes of CHF with new right-sided pleural effusion. Electronically Signed   By: Inez Catalina M.D.   On: 09/02/2020 21:57    Pending Labs Unresulted Labs (From admission, onward)    Start     Ordered   08/28/20 7858  Basic metabolic panel  Tomorrow morning,   R        08/28/20 0014   08/28/20 0500  Blood gas, arterial  Tomorrow morning,   R        08/28/20 0014   08/28/20 0500  Magnesium  Tomorrow morning,   R        08/28/20 0014   08/28/20 0014  Protime-INR  Once,   STAT        08/28/20 0014   08/28/20 0014  APTT  Once,   STAT        08/28/20 0014   08/28/20 0012  Hemoglobin A1c  Once,   STAT       Comments: To assess prior glycemic control    08/28/20 0014          Vitals/Pain Today's Vitals   09/02/2020 2300 08/25/2020 2332 08/28/20 0045 08/28/20 0130  BP: (!) 148/57 125/75 127/67 (!) 146/68  Pulse: (!) 106 (!) 108 (!) 111 (!) 112  Resp: (!) 33 19 15 20   Temp:      TempSrc:      SpO2: 97% 91% 100% 100%  PainSc:        Isolation Precautions No active isolations  Medications Medications  docusate sodium (COLACE) capsule 100 mg (has no administration in time range)  polyethylene glycol (MIRALAX / GLYCOLAX) packet 17 g (has no administration in time range)  insulin aspart (novoLOG) injection 0-9 Units (2 Units Subcutaneous Given 08/28/20 0103)  famotidine (PEPCID) IVPB 20 mg premix (has no administration in time range)  ondansetron (ZOFRAN) injection 4 mg (has no administration in  time range)  acetaminophen (TYLENOL) tablet 650 mg (has no administration in time range)  albuterol (PROVENTIL) (2.5 MG/3ML) 0.083% nebulizer solution 2.5 mg (has no administration in time range)  ipratropium-albuterol (DUONEB) 0.5-2.5 (3) MG/3ML nebulizer solution 3 mL (has no administration in time range)  ipratropium-albuterol (DUONEB) 0.5-2.5 (3) MG/3ML nebulizer solution 3 mL (3 mLs Nebulization Given 08/27/2020 2248)  furosemide (LASIX) injection 60 mg (60 mg Intravenous Given 09/16/2020 2231)    Mobility walks with person assist

## 2020-08-28 NOTE — ED Notes (Signed)
Respiratory contacted for transfer to floor

## 2020-08-28 NOTE — Progress Notes (Signed)
Pt transported from ED room WA16 to room 1227.  Pt remained stable and comfortable on BiPAP throughout the trip.

## 2020-08-28 NOTE — Plan of Care (Signed)
  Problem: Education: Goal: Knowledge of General Education information will improve Description: Including pain rating scale, medication(s)/side effects and non-pharmacologic comfort measures Outcome: Progressing   Problem: Clinical Measurements: Goal: Respiratory complications will improve Outcome: Not Progressing   Problem: Nutrition: Goal: Adequate nutrition will be maintained Outcome: Not Progressing   Problem: Coping: Goal: Level of anxiety will decrease Outcome: Progressing

## 2020-08-28 NOTE — Progress Notes (Signed)
NAME:  Sara Ferguson, MRN:  [id number], DOB:  May 23, 1945, LOS: 0 ADMISSION DATE:  08/20/2020, CONSULTATION DATE:  7/11 REFERRING MD:  7/11, CHIEF COMPLAINT:  dyspnea and decreased level of consciousness   History of Present Illness:  75 y/o female admitted in the setting of decreased level of conciousness and hypoxemia while in the MRI scanner as part of her work up for lung cance.r  She was placed on BIPAP and moved ot the ICU and PCCM was consulted.    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 rate (!) 21, SpO2 98 %.    FiO2 (%):  [30 %-45 %] 30 %  No intake or output data in the 24 hours ending 08/28/20 0806 There were no vitals filed for this visit.  Examination:  General:  Frail female, increased work of breathing HENT: NCAT OP clear PULM: No breath sounds on left, breath sounds noted on right but diminished as well, increased effort CV: tachycardic, muffled heart sounds GI: BS+, soft, nontender MSK: normal bulk and tone Derm: significant edema in legs bilaterally Neuro: awake, alert, no distress, MAEW    Resolved Hospital Problem list     Assessment & Plan:  Acute on chronic respiratory failure with hypoxemia due to left lung mass and new right pleural effusion BIPAP prn Place chest tube right lung> send fluid for cytology May need morphine soon  Possible acute pulmonary edema Lasix now  Stage IV lung cancer: currently not a candidate for cancer  treatment as she is critically ill due to respiratory failure.  If her condition improves with furosemide and a chest tube will re-assess.  However I suspect that she will need in patient palliative care Will consult palliative medicine and update oncology  Pericardial effusion Continue colchicine  Best Practice (right click and "Reselect all SmartList Selections" daily)   Diet/type: Regular consistency (see orders) DVT prophylaxis: prophylactic heparin  GI prophylaxis: N/A Lines: N/A Foley:  N/A Code Status:  DNR Last date of multidisciplinary goals of care discussion [7/12: I updated her niece and let her know that if the chest tube doesn't help she will need inpatient comfort measures]  Labs   CBC: Recent Labs  Lab 08/21/20 1148 08/23/2020 2142  WBC 7.8 11.2*  NEUTROABS 6.2 9.8*  HGB 11.5* 12.0  HCT 35.2* 39.4  MCV 87.1 92.5  PLT 426* 599    Basic Metabolic Panel: Recent Labs  Lab 08/21/20 1148 08/22/2020 2142 08/28/20 0314  NA 136 141 142  K 4.2 3.5 3.4*  CL 99 99 100  CO2 27 30 30   GLUCOSE 139* 208* 152*  BUN 12 16 17   CREATININE 0.70 0.71 0.59  CALCIUM 9.3 9.4 9.4  MG  --   --  1.8   GFR: Estimated Creatinine Clearance: 50.9 mL/min (by C-G formula based on SCr of 0.59 mg/dL). Recent Labs  Lab 08/21/20 1148 09/09/2020 2142  WBC 7.8 11.2*    Liver Function Tests: Recent Labs  Lab 08/21/20 1148  AST 54*  ALT 51*  ALKPHOS 130*  BILITOT 0.7  PROT 7.8  ALBUMIN 2.7*   No results for input(s): LIPASE, AMYLASE in the last 168 hours. No results for input(s): AMMONIA in the last 168 hours.  ABG    Component Value Date/Time   PHART 7.418 08/28/2020 0505   PCO2ART 49.3 (H) 08/28/2020 0505   PO2ART 56.2 (L) 08/28/2020 0505   HCO3 31.2 (H) 08/28/2020 0505   TCO2 23 08/07/2020 2009   ACIDBASEDEF 0.5 08/31/2020 2142   O2SAT 89.8 08/28/2020 0505     Coagulation Profile: Recent Labs  Lab 08/28/20 0314  INR 1.1    Cardiac Enzymes: No results for  input(s): CKTOTAL, CKMB, CKMBINDEX, TROPONINI in the last 168 hours.  HbA1C: Hgb A1c MFr Bld  Date/Time Value Ref Range Status  08/28/2020 03:14 AM 6.2 (H) 4.8 - 5.6 % Final    Comment:    (NOTE) Pre diabetes:          5.7%-6.4%  Diabetes:              >6.4%  Glycemic control for   <7.0% adults with diabetes   08/06/2020 03:30 AM 6.3 (H) 4.8 - 5.6 % Final    Comment:    (NOTE)         Prediabetes: 5.7 - 6.4         Diabetes: >6.4         Glycemic control for adults with diabetes: <7.0     CBG: Recent Labs  Lab 08/28/20 0323 08/28/20 0739  GLUCAP 143* 78    Critical care time: 35 minutes     Roselie Awkward, MD  PCCM Pager: [phone] Cell: [phone] After 7:00 pm call Elink  [phone]

## 2020-08-28 NOTE — Progress Notes (Signed)
[facility] Progress Note Patient Name: Sara Ferguson DOB: 25-Aug-1945 MRN: [id number]   Date of Service  08/28/2020  HPI/Events of Note  Patient admitted with acute on chronic respiratory failure secondary to CHF, malignant pleural effusions, and pericardial effusion. Patient is currently on BIPAP.  eICU Interventions  New Patient Evaluation.        Sara Ferguson Sara Ferguson 08/28/2020, 3:25 AM

## 2020-08-29 ENCOUNTER — Ambulatory Visit: Payer: Self-pay | Admitting: Hematology and Oncology

## 2020-08-29 LAB — CBC WITH DIFFERENTIAL/PLATELET
Abs Immature Granulocytes: 0.02 10*3/uL (ref 0.00–0.07)
Basophils Absolute: 0 10*3/uL (ref 0.0–0.1)
Basophils Relative: 0 %
Eosinophils Absolute: 0 10*3/uL (ref 0.0–0.5)
Eosinophils Relative: 0 %
HCT: 37.7 % (ref 36.0–46.0)
Hemoglobin: 11.4 g/dL — ABNORMAL LOW (ref 12.0–15.0)
Immature Granulocytes: 0 %
Lymphocytes Relative: 10 %
Lymphs Abs: 0.8 10*3/uL (ref 0.7–4.0)
MCH: 27.8 pg (ref 26.0–34.0)
MCHC: 30.2 g/dL (ref 30.0–36.0)
MCV: 92 fL (ref 80.0–100.0)
Monocytes Absolute: 1 10*3/uL (ref 0.1–1.0)
Monocytes Relative: 12 %
Neutro Abs: 6.5 10*3/uL (ref 1.7–7.7)
Neutrophils Relative %: 78 %
Platelets: 290 10*3/uL (ref 150–400)
RBC: 4.1 MIL/uL (ref 3.87–5.11)
RDW: 13.2 % (ref 11.5–15.5)
WBC: 8.4 10*3/uL (ref 4.0–10.5)
nRBC: 0 % (ref 0.0–0.2)

## 2020-08-29 LAB — COMPREHENSIVE METABOLIC PANEL
ALT: 35 U/L (ref 0–44)
AST: 42 U/L — ABNORMAL HIGH (ref 15–41)
Albumin: 2.6 g/dL — ABNORMAL LOW (ref 3.5–5.0)
Alkaline Phosphatase: 124 U/L (ref 38–126)
Anion gap: 10 (ref 5–15)
BUN: 17 mg/dL (ref 8–23)
CO2: 31 mmol/L (ref 22–32)
Calcium: 8.8 mg/dL — ABNORMAL LOW (ref 8.9–10.3)
Chloride: 100 mmol/L (ref 98–111)
Creatinine, Ser: 0.61 mg/dL (ref 0.44–1.00)
GFR, Estimated: >60 mL/min (ref >60)
Glucose, Bld: 87 mg/dL (ref 70–99)
Potassium: 3.1 mmol/L — ABNORMAL LOW (ref 3.5–5.1)
Sodium: 141 mmol/L (ref 135–145)
Total Bilirubin: 1 mg/dL (ref 0.3–1.2)
Total Protein: 6.3 g/dL — ABNORMAL LOW (ref 6.5–8.1)

## 2020-08-29 LAB — GLUCOSE, CAPILLARY
Glucose-Capillary: 90 mg/dL (ref 70–99)
Glucose-Capillary: 81 mg/dL (ref 70–99)
Glucose-Capillary: 90 mg/dL (ref 70–99)

## 2020-08-29 LAB — MAGNESIUM: Magnesium: 1.8 mg/dL (ref 1.7–2.4)

## 2020-08-29 MED ORDER — POLYVINYL ALCOHOL 1.4 % OP SOLN
1.0000 [drp] | Freq: Four times a day (QID) | OPHTHALMIC | Status: DC | PRN
  Filled 2020-08-29: qty 15

## 2020-08-29 MED ORDER — LORAZEPAM 2 MG/ML IJ SOLN
INTRAMUSCULAR | Status: AC
  Administered 2020-08-29: 1 mg via INTRAVENOUS
  Filled 2020-08-29: qty 1

## 2020-08-29 MED ORDER — LORAZEPAM 2 MG/ML IJ SOLN: 1.0000 mg | Freq: Once | INTRAMUSCULAR | Status: AC

## 2020-08-29 MED ORDER — GLYCOPYRROLATE 1 MG PO TABS: 1.0000 mg | ORAL_TABLET | ORAL | Status: DC | PRN

## 2020-08-29 MED ORDER — DIPHENHYDRAMINE HCL 50 MG/ML IJ SOLN: 25.0000 mg | INTRAMUSCULAR | Status: DC | PRN

## 2020-08-29 MED ORDER — MORPHINE SULFATE (PF) 2 MG/ML IV SOLN: 2.0000 mg | INTRAVENOUS | Status: DC | PRN

## 2020-08-29 MED ORDER — LORAZEPAM 0.5 MG PO TABS
0.5000 mg | ORAL_TABLET | ORAL | Status: DC | PRN
  Administered 2020-08-29: 0.5 mg via ORAL
  Filled 2020-08-29: qty 1

## 2020-08-29 MED ORDER — LORAZEPAM 2 MG/ML IJ SOLN: 2.0000 mg | INTRAMUSCULAR | Status: DC | PRN

## 2020-08-29 MED ORDER — OXYCODONE HCL 5 MG PO TABS
5.0000 mg | ORAL_TABLET | ORAL | Status: DC | PRN
  Administered 2020-08-29: 5 mg via ORAL
  Filled 2020-08-29: qty 1

## 2020-08-29 MED ORDER — MORPHINE 100MG IN NS 100ML (1MG/ML) PREMIX INFUSION
0.0000 mg/h | INTRAVENOUS | Status: DC
  Administered 2020-08-29: 5 mg/h via INTRAVENOUS
  Filled 2020-08-29 (×3): qty 100

## 2020-08-29 MED ORDER — GLYCOPYRROLATE 0.2 MG/ML IJ SOLN
0.2000 mg | INTRAMUSCULAR | Status: DC | PRN
Start: 2020-08-29 — End: 2020-08-30

## 2020-08-29 MED ORDER — DEXTROSE 5 % IV SOLN: INTRAVENOUS | Status: DC

## 2020-08-29 MED ORDER — GLYCOPYRROLATE 0.2 MG/ML IJ SOLN: 0.2000 mg | INTRAMUSCULAR | Status: DC | PRN

## 2020-08-29 MED ORDER — MAGNESIUM SULFATE 2 GM/50ML IV SOLN
2.0000 g | Freq: Once | INTRAVENOUS | Status: AC
  Administered 2020-08-29: 2 g via INTRAVENOUS
  Filled 2020-08-29: qty 50

## 2020-08-29 MED ORDER — MORPHINE BOLUS VIA INFUSION
5.0000 mg | INTRAVENOUS | Status: DC | PRN
  Administered 2020-08-29: 5 mg via INTRAVENOUS
  Filled 2020-08-29: qty 5

## 2020-08-29 MED ORDER — POTASSIUM CHLORIDE 10 MEQ/100ML IV SOLN
10.0000 meq | INTRAVENOUS | Status: DC
  Administered 2020-08-29 (×4): 10 meq via INTRAVENOUS
  Filled 2020-08-29 (×8): qty 100

## 2020-09-04 ENCOUNTER — Ambulatory Visit: Payer: Self-pay | Admitting: Hematology and Oncology

## 2020-09-05 ENCOUNTER — Ambulatory Visit: Payer: Self-pay | Admitting: Hematology and Oncology

## 2020-09-12 ENCOUNTER — Other Ambulatory Visit: Payer: Self-pay

## 2020-09-14 ENCOUNTER — Encounter (HOSPITAL_COMMUNITY): Payer: Self-pay | Admitting: Hematology and Oncology

## 2020-09-17 NOTE — Progress Notes (Signed)
Palliative care brief progress note  Discussed case with Eliseo Gum from [facility].  Sara Ferguson is transitioning to comfort care.  She does appear comfortable at this time.  Palliative will plan to follow-up tomorrow to assess for any symptom management needs.  Please call if there are needs in the interim.  Micheline Rough, MD [place] Palliative Medicine Team [phone]  NO CHARGE NOTE

## 2020-09-17 NOTE — Consult Note (Signed)
Consultation Note Date: 2020/09/20   Patient Name: Sara Ferguson  DOB: 1945-04-26  MRN: [id number]  Age / Sex: 75 y.o., female  PCP: [name], MD Referring Physician: Juanito Doom, MD  Reason for Consultation: Establishing goals of care  HPI/Patient Profile: 75 y.o. female  with past medical history of adenocarcinoma of the left lung admitted on 09/06/2020 with decreased level of consciousness and hypoxemia while obtaining MRI as part of work-up for lung cancer.  She was placed on BiPAP and tolerated that well.  Work-up revealed persistent pericardial left pleural effusion as well as new right-sided pleural effusion.  Chest tube was placed and fluid was sent for cytology.  Due to severity of her condition, palliative consulted for goals of care.  Clinical Assessment and Goals of Care: I met today with [name].   I introduced palliative care as specialized medical care for people living with serious illness. It focuses on providing relief from the symptoms and stress of a serious illness. The goal is to improve quality of life for both the patient and the family.  She remembers meeting Tacey Ruiz from our team in the past.  We discussed clinical course as well as wishes moving forward in regard to advanced directives.  Concepts specific to code status and care plan this hospitalization discussed.  We discussed difference between a aggressive medical intervention path and a palliative, comfort focused care path.  Values and goals of care important to patient and family were attempted to be elicited.  [name] reports understanding that she has severe disease and there is high concern that she may not survive this hospitalization.  She tells me that she has a lot of faith and that she is relying on "God's will".  At the same time, she states that she feels better after placement of chest  tube and is not "giving up."  She does agree that CPR or intubation in the event of respiratory or cardiac arrest is not likely to result in her getting well enough to get out of the hospital and is in agreement with DNR/DNI as limit of care.  At this point, she would like to continue with current interventions and reassess her situation again in 24 to 48 hours (assuming she does not decompensated).   Questions and concerns addressed.   PMT will continue to support holistically.  SUMMARY OF RECOMMENDATIONS   -DNR/DNI -Continue current interventions.  [name] reports that she feels that she is better today after placement of chest tube.  She still remains very frail and appears short of breath at time of my examination.  Overall, she is hopeful for continued medical interventions and further treatment for her lung cancer, but at the same time she understands the severity of her condition.  I was very open with her about the fact this may be a terminal event.  We discussed plan to continue with current interventions and reassess her situation tomorrow. -I called and reviewed the above with patient's niece, [name].  She  reports having the opportunity to speak with Dr. Lake Bells and understands the severity of [name]'s condition.  She tells me that, if she survives this acute illness, they will need to speak further with Dr. [name] regarding long-term care plan once results of CT and MRI are available.  I checked today per her request and results of these are not yet posted.  She tells me that Dr. [name] had ordered these to help assess if she is a candidate for further disease modifying therapy for her cancer. -Palliative to continue to follow.  She is certainly at high risk for continued decline and this may well be a terminal admission.  Code Status/Advance Care Planning: DNR Palliative Prophylaxis:  Bowel Regimen and Frequent Pain Assessment  Psycho-social/Spiritual:  Desire for  further Chaplaincy support: Did not address today Additional Recommendations: Caregiving  Support/Resources  Prognosis:  Guarded  Discharge Planning: To Be Determined      Primary Diagnoses: Present on Admission:  Pleural effusion, malignant  Bilateral pleural effusion  Pericardial effusion  Acute hypercapnic respiratory failure (HCC)  Adenocarcinoma of left lung, stage 4 (HCC)  Elevated brain natriuretic peptide (BNP) level  Pulmonary edema  Acute hypoxemic respiratory failure ([place])   I have reviewed the medical record, interviewed the patient and family, and examined the patient. The following aspects are pertinent.  Past Medical History:  Diagnosis Date   Arthritis    Diabetes mellitus without complication ([place])    Hypertension    Non-small cell lung cancer ([place])    Dx 12/30/19 by thoracentesis    Social History   Socioeconomic History   Marital status: Widowed    Spouse name: Not on file   Number of children: Not on file   Years of education: Not on file   Highest education level: Not on file  Occupational History   Not on file  Tobacco Use   Smoking status: Never   Smokeless tobacco: Never  Substance and Sexual Activity   Alcohol use: Not Currently   Drug use: Never   Sexual activity: Not on file  Other Topics Concern   Not on file  Social History Narrative   Not on file   Social Determinants of Health   Financial Resource Strain: Low Risk    Difficulty of Paying Living Expenses: Not hard at all  Food Insecurity: No Food Insecurity   Worried About Running Out of Food in the Last Year: Never true   [place] in the Last Year: Never true  Transportation Needs: No Transportation Needs   Lack of Transportation (Medical): No   Lack of Transportation (Non-Medical): No  Physical Activity: Inactive   Days of Exercise per Week: 0 days   Minutes of Exercise per Session: 0 min  Stress: No Stress Concern Present   Feeling of Stress : Not at all   Social Connections: Socially Isolated   Frequency of Communication with Friends and Family: Once a week   Frequency of Social Gatherings with Friends and Family: Once a week   Attends Religious Services: Never   Marine scientist or Organizations: No   Attends Archivist Meetings: 1 to 4 times per year   Marital Status: Widowed   History reviewed. No pertinent family history. Scheduled Meds:  amLODipine  5 mg Oral Daily   chlorhexidine  15 mL Mouth Rinse BID   Chlorhexidine Gluconate Cloth  6 each Topical Daily   colchicine  0.6 mg Oral Daily   heparin injection (  subcutaneous)  5,000 Units Subcutaneous Q8H   hydrALAZINE  25 mg Oral Q8H   insulin aspart  0-9 Units Subcutaneous Q4H   ipratropium-albuterol  3 mL Nebulization Q6H   mouth rinse  15 mL Mouth Rinse q12n4p   metoprolol tartrate  25 mg Oral BID   potassium chloride  40 mEq Oral Once   sodium chloride flush  10 mL Intracatheter Q8H   Continuous Infusions:  famotidine (PEPCID) IV Stopped (08/28/20 2251)   potassium chloride 100 mL/hr at 2020/09/02 0617   PRN Meds:.acetaminophen, albuterol, docusate sodium, ondansetron (ZOFRAN) IV, polyethylene glycol Medications Prior to Admission:  Prior to Admission medications   Medication Sig Start Date End Date Taking? Authorizing Provider  albuterol (VENTOLIN HFA) 108 (90 Base) MCG/ACT inhaler Inhale 2 puffs into the lungs every 6 (six) hours as needed for wheezing or shortness of breath. 01/05/20 08/31/2020 Yes Kc, Maren Beach, MD  amLODipine (NORVASC) 5 MG tablet TAKE 1 TABLET (5 MG TOTAL) BY MOUTH DAILY. 05/21/20 09/01/2020 Yes [name], MD  colchicine 0.6 MG tablet Take 1 tablet (0.6 mg total) by mouth 2 (two) times daily. 01/28/20  Yes Oretha Milch D, MD  furosemide (LASIX) 20 MG tablet Take 20 mg by mouth daily. 08/24/20  Yes [provider]  hydrALAZINE (APRESOLINE) 25 MG tablet Take 25 mg by mouth 2 (two) times daily.   Yes [provider]   hydrocortisone cream 1 % Apply 1 application topically daily as needed for itching (rash).   Yes [provider]  metoprolol tartrate (LOPRESSOR) 25 MG tablet Take 25 mg by mouth 2 (two) times daily.   Yes [provider]  potassium chloride (KLOR-CON) 10 MEQ tablet Take 10 mEq by mouth 2 (two) times daily.   Yes [provider]   No Known Allergies Review of Systems  Constitutional:  Positive for activity change and fatigue.  Respiratory:  Positive for chest tightness and shortness of breath.   Neurological:  Positive for weakness.  Psychiatric/Behavioral:  Positive for sleep disturbance.    Physical Exam General: Alert, awake, increased work of breathing.   HEENT: No bruits, no goiter, no JVD Heart: Tachycardic, distant, no murmur appreciated Lungs: Diminished bilaterally with scattered coarse throughout Abdomen: Soft, nontender, nondistended, positive bowel sounds.   Ext: Significant edema in lower extremities Skin: Warm and dry Neuro: Grossly intact, nonfocal.   Vital Signs: BP (!) 164/56   Pulse 100   Temp 98.2 F (36.8 C) (Oral)   Resp (!) 26   Wt 44 kg   SpO2 98%   BMI 16.65 kg/m  Pain Scale: 0-10   Pain Score: Asleep   SpO2: SpO2: 98 % O2 Device:SpO2: 98 % O2 Flow Rate: .O2 Flow Rate (L/min): 4 L/min  IO: Intake/output summary:  Intake/Output Summary (Last 24 hours) at 09-02-2020 0641 Last data filed at 09-02-20 0617 Gross per 24 hour  Intake 260.37 ml  Output 2550 ml  Net -2289.63 ml    LBM: Last BM Date:  (PTA) Baseline Weight: Weight: 44 kg Most recent weight: Weight: 44 kg     Palliative Assessment/Data:   Flowsheet Rows    Flowsheet Row Most Recent Value  Intake Tab   Referral Department Hospitalist  Unit at Time of Referral ICU  Palliative Care Primary Diagnosis Sepsis/Infectious Disease  Date Notified 08/28/20  Palliative Care Type Return patient Palliative Care  Reason for referral Clarify Goals of Care   Date of Admission 08/20/2020  Date first seen by Palliative Care 08/28/20  #  of days Palliative referral response time 0 Day(s)  # of days IP prior to Palliative referral 1  Clinical Assessment   Palliative Performance Scale Score 30%  Psychosocial & Spiritual Assessment   Palliative Care Outcomes   Patient/Family meeting held? Yes  Who was at the meeting? Patient       Time In: 1310 Time Out: 1430 Time Total: 80  Greater than 50%  of this time was spent counseling and coordinating care related to the above assessment and plan.  Signed by: Micheline Rough, MD   Please contact Palliative Medicine Team phone at [phone] for questions and concerns.  For individual provider: See Shea Evans

## 2020-09-17 NOTE — Progress Notes (Addendum)
NAME:  Sara Ferguson, MRN:  [id number], DOB:  1945/03/23, LOS: 1 ADMISSION DATE:  09/15/2020, CONSULTATION DATE:  7/11 REFERRING MD:  7/11, CHIEF COMPLAINT:  dyspnea and decreased level of consciousness   History of Present Illness:  75 y/o female admitted in the setting of decreased level of conciousness and hypoxemia while in the MRI scanner as part of her work up for lung cance.r  She was placed on BIPAP and moved ot the ICU and PCCM was consulted.    Pertinent  Medical History  Stage IV lung cancer, adenocarcinoma > diagnosed 12/2019; s/p  Hypertension DM2 Arthritis  Significant Hospital Events: Including procedures, antibiotic start and stop dates in addition to other pertinent events   7/11 MRI brain, unresponsive, dyspnea, on NIMV.  7/12 pigtail 7/13 MRI results finalized: no brain mets, but with numerous new bone mets throughout calvarium. Patient decided to transition to comfort care   Interim History / Subjective:  MRI results finalized which show new bone mets throughout calvarium. Patient requested to eat breakfast before talking about MRI results.   We did however have a long talk about goals of care -- see separate brief progress note -- in which patient has decided to transition to comfort focussed care   Objective   Blood pressure (!) 147/127, pulse (!) 110, temperature (!) 97.5 F (36.4 C), temperature source Axillary, resp. rate (!) 31, weight 44 kg, SpO2 96 %.    FiO2 (%):  [30 %] 30 %   Intake/Output Summary (Last 24 hours) at Sep 10, 2020 0943 Last data filed at 10-Sep-2020 0617 Gross per 24 hour  Intake 260.37 ml  Output 2550 ml  Net -2289.63 ml   Filed Weights   September 10, 2020 0500  Weight: 44 kg    Examination:  General:  Frail deconditioned elderly F reclined in bed with increased work of breathing  HENT: NCAT. Temporal muscle wasting. Pink mm  PULM: absent L sided breath sounds. Trap and scalene accessory muscle recruitment  CV: tachycardic, very  distant heart sounds. 1+ peripheral pulse  GI: thin soft ndnt  MSK: no acute joint deformity no cyanosis or clubbing. BLE edema  Skin: c/d/w no rash  Neuro: AAOx3 following commands, perrl  Psych: calm, cooperative. Appropriate for age and situation     Resolved Hospital Problem list     Assessment & Plan:   Stage IV pulmonary adenocarcinoma -poor candidate for onc therapies. 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  -dc labs, medications not aimed at comfort -adding PO oxy and ativan (pt requesting trail of orals before IV therapies) -- will escalate to IV morphine if needed   Acute on chronic respiratory failure with hypoxemia: L Lung Mass  R pleural effusion -effusion improving s/p pigtail 7/12  Possible pulmonary edema  P -does not look like ordered pleural fluid cytology was sent -- in light of transitioning to comfort care, no real utility in sending a repeat sample  -routine pigtail management  -O2 for comfort (patient feels more comfortable with a mask. Fine for now -- anticipate that with optimized palliative interventions we will be able to scale back on supplemental O2)  -continue BD   Hypokalemia P -dc remaining replacement given transition to comfort care   Pericardial effusion -dc colcrys   Best Practice (right click and "Reselect all SmartList Selections" daily)   Diet/type: Regular consistency (see orders) DVT  prophylaxis: prophylactic heparin  GI prophylaxis: N/A Lines: N/A Foley:  N/A Code Status:  DNR Last date of multidisciplinary goals of care discussion niece was updated 7/12 that patient may require comfort care measures.   Dispo: stable for transfer out of ICU. Will ask [facility] to take over care 7/14 CCM off   Labs   CBC: Recent Labs  Lab 09/12/2020 2142 09-25-2020 0238  WBC 11.2* 8.4   NEUTROABS 9.8* 6.5  HGB 12.0 11.4*  HCT 39.4 37.7  MCV 92.5 92.0  PLT 389 157    Basic Metabolic Panel: Recent Labs  Lab 09/16/2020 2142 08/28/20 0314 2020-09-25 0238  NA 141 142 141  K 3.5 3.4* 3.1*  CL 99 100 100  CO2 30 30 31   GLUCOSE 208* 152* 87  BUN 16 17 17   CREATININE 0.71 0.59 0.61  CALCIUM 9.4 9.4 8.8*  MG  --  1.8 1.8   GFR: Estimated Creatinine Clearance: 40.9 mL/min (by C-G formula based on SCr of 0.61 mg/dL). Recent Labs  Lab 09/06/2020 2142 09-25-2020 0238  WBC 11.2* 8.4    Liver Function Tests: Recent Labs  Lab 09-25-2020 0238  AST 42*  ALT 35  ALKPHOS 124  BILITOT 1.0  PROT 6.3*  ALBUMIN 2.6*   No results for input(s): LIPASE, AMYLASE in the last 168 hours. No results for input(s): AMMONIA in the last 168 hours.  ABG    Component Value Date/Time   PHART 7.418 08/28/2020 0505   PCO2ART 49.3 (H) 08/28/2020 0505   PO2ART 56.2 (L) 08/28/2020 0505   HCO3 31.2 (H) 08/28/2020 0505   TCO2 23 08/07/2020 2009   ACIDBASEDEF 0.5 08/26/2020 2142   O2SAT 89.8 08/28/2020 0505     Coagulation Profile: Recent Labs  Lab 08/28/20 0314  INR 1.1    Cardiac Enzymes: No results for input(s): CKTOTAL, CKMB, CKMBINDEX, TROPONINI in the last 168 hours.  HbA1C: Hgb A1c MFr Bld  Date/Time Value Ref Range Status  08/28/2020 03:14 AM 6.2 (H) 4.8 - 5.6 % Final    Comment:    (NOTE) Pre diabetes:          5.7%-6.4%  Diabetes:              >6.4%  Glycemic control for   <7.0% adults with diabetes   08/06/2020 03:30 AM 6.3 (H) 4.8 - 5.6 % Final    Comment:    (NOTE)         Prediabetes: 5.7 - 6.4         Diabetes: >6.4         Glycemic control for adults with diabetes: <7.0     CBG: Recent Labs  Lab 08/28/20 1710 08/28/20 1937 25-Sep-2020 0044 09/25/20 0324 2020-09-25 0721  GLUCAP 100* 91 90 81 90    CCT: n/a   Eliseo Gum MSN, AGACNP-BC [place] for pager 25-Sep-2020, 9:43 AM

## 2020-09-17 NOTE — Progress Notes (Signed)
[facility] Progress Note Patient Name: Sara Ferguson DOB: [birth year] MRN: [id number]   Date of Service  September 28, 2020  HPI/Events of Note  K+ 3.1, Mg++ 1.8  eICU Interventions  Electrolytes replaced per E-Link adult electrolyte replacement protocol.        Kerry Kass [name] 2020/09/28, 4:19 AM

## 2020-09-17 NOTE — Death Summary Note (Signed)
DEATH SUMMARY   Patient Details  Name: Sara Ferguson MRN: [id number] DOB: 1945-03-21  Admission/Discharge Information   Admit Date:  2020/09/22  Date of Death: Date of Death: 2020-09-24  Time of Death: Time of Death: 1636/05/22  Length of Stay: 1  Referring Physician: [name], MD   Reason(s) for Hospitalization  Dyspnea  Diagnoses  Preliminary cause of death:  Lung cancer Secondary Diagnoses (including complications and co-morbidities):  Principal Problem:   Acute hypercapnic respiratory failure ([place]) Active Problems:   Pleural effusion, malignant   Bilateral pleural effusion   Type 2 diabetes mellitus without complication (HCC)   Pericardial effusion   Adenocarcinoma of left lung, stage 4 ([place])   DNR (do not resuscitate) discussion   Elevated brain natriuretic peptide (BNP) level   Pulmonary edema   Acute hypoxemic respiratory failure ([place]) DNR Comfort measures  Brief Hospital Course (including significant findings, care, treatment, and services provided and events leading to death)  Sara Ferguson is a 75 y.o. year old female who has stage 4 lung cancer and was admitted for acute respiratory failure with hypoxemia due to an enlarging right pleural effusion and large left lung mass with associated left sided pleural effusion.  She was admitted to the ICU due to severe acute respiratory faliure.  On admission she noted she was DNR, but was still interested in aggressive management. We placed a chest tube and drained over 1.3 liters of fluid from her right chest but she remained dyspneic.  We explained to her and her daughter that she was not a candidate for more cancer treatment as she was too weak and treatment would cause more harm than good.  Based on this she elected to focus completely on comfort measures.  We started narcotics and benzodiazepines for relief of dyspnea and anxiety.  She died peacefully.     Pertinent Labs and Studies  Significant Diagnostic Studies DG  Chest 1 View  Result Date: 08/28/2020 CLINICAL DATA:  75 year old female chest tube placement. Metastatic adenocarcinoma. EXAM: CHEST  1 VIEW COMPARISON:  Portable chest May 22, 2112 hours yesterday. CT Abdomen and Pelvis 1829 hours yesterday. FINDINGS: Portable AP semi upright view at 1010 hours. Pigtail right chest tube placed and projects at the right cardiophrenic angle. Veiling opacity has largely resolved in the right lung since yesterday. Continued dense veiling opacity in the left lung and obscuring the cardiac contour. Right lung pulmonary vascularity appears more normal. No pneumothorax. No areas of worsening ventilation. Small volume of bowel gas in the upper abdomen. Stable visualized osseous structures. IMPRESSION: 1. Regressed or resolved right pleural effusion and improved right lung ventilation following pigtail chest tube placement. No pneumothorax. 2. Stable ongoing abnormal left lung and mediastinal opacity. Electronically Signed   By: Genevie Ann M.D.   On: 08/28/2020 10:38   DG Chest 1 View  Result Date: 08/11/2020 CLINICAL DATA:  Shortness of breath.  Thoracentesis EXAM: CHEST  1 VIEW COMPARISON:  Yesterday FINDINGS: Negative for pneumothorax. Continued extensive opacification of the left chest from pulmonary opacity and small loculated pleural effusion by CT. Cardiopericardial enlargement with large pericardial effusion by CT. Improved aeration on the right, although persistent perihilar infiltrate. IMPRESSION: 1. No acute finding related to thoracentesis. 2. No detected change from yesterday other than improved lung volumes. Electronically Signed   By: Monte Fantasia M.D.   On: 08/11/2020 10:29   CT Chest Wo Contrast  Result Date: 08/05/2020 CLINICAL DATA:  Shortness of breath EXAM: CT CHEST WITHOUT CONTRAST TECHNIQUE: Multidetector CT imaging  of the chest was performed following the standard protocol without IV contrast. COMPARISON:  04/19/2020 FINDINGS: Cardiovascular: Large pericardial  effusion measuring approximately 2.1 cm in thickness. Calcific aortic atherosclerosis. Mediastinum/Nodes: Increased density within the upper mediastinum, likely lymphadenopathy. There is left greater than right axillary adenopathy. Asymmetric density within the left breast. There is also left breast skin thickening. Lungs/Pleura: Left-greater-than-right heterogeneous interstitial and airspace opacity. Peribronchial thickening. Left lower lobe bronchi are occluded. There is complete complex of the left lower lobe. Upper Abdomen: 16 mm left adrenal mass. Musculoskeletal: Sclerotic lesions of the left second,, right third and left seventh ribs. Numerous sclerotic lesions within the vertebral bodies, greatest at T3. IMPRESSION: 1. Left breast heterogeneous density with skin thickening is concerning for carcinoma the skin thickening could be due to edema. Correlation with mammography is recommended. 2. Large pericardial effusion, measuring up to 2.1 cm in thickness, progressed. 3. Left-greater-than-right heterogeneous interstitial and airspace opacity, concerning for pulmonary edema or infection. Left lower lobe bronchi are occluded. Reportedly the patient has a history of non-small cell lung carcinoma, but no distinct mass is visualized on this noncontrast study. Aeration of both lungs is much worse than on 04/19/2020. 4. Left greater than right axillary and mediastinal lymphadenopathy, likely metastatic. 5. Sclerotic lesions of the left second, right third and left seventh ribs, consistent with metastatic disease. 6. 16 mm left adrenal mass, concerning for metastatic disease. Aortic Atherosclerosis (ICD10-I70.0). Electronically Signed   By: Ulyses Jarred M.D.   On: 08/05/2020 20:53   MR Brain W Wo Contrast  Result Date: 09/05/2020 CLINICAL DATA:  Non-small cell lung cancer staging EXAM: MRI HEAD WITHOUT AND WITH CONTRAST TECHNIQUE: Multiplanar, multiecho pulse sequences of the brain and surrounding structures were  obtained without and with intravenous contrast. CONTRAST:  32mL GADAVIST GADOBUTROL 1 MMOL/ML IV SOLN COMPARISON:  01/30/2020 FINDINGS: Brain: No enhancement or swelling to suggest metastatic disease. Apparent FLAIR hyperintensity over the posterior vermis is attributed to artifact. Chronic small vessel ischemia confluent deep white matter gliosis and chronic lacune at the posterior right putamen. Generalized volume loss. No acute infarct. Vascular: Unremarkable Skull and upper cervical spine: New numerous hypointense bone lesions throughout the calvarium best seen on axial T1 and diffusion imaging. No evidence of dural infiltration. Sinuses/Orbits: Negative Other: Generalized motion artifact to the degree that findings could be obscured. IMPRESSION: 1. Numerous calvarial metastases since 2021. 2. No evidence of intracranial metastasis. 3. Generalized motion artifact. Electronically Signed   By: Monte Fantasia M.D.   On: 09/05/20 07:27   CT Abdomen Pelvis W Contrast  Result Date: September 05, 2020 CLINICAL DATA:  Left-sided lung cancer.  Restaging. EXAM: CT ABDOMEN AND PELVIS WITH CONTRAST TECHNIQUE: Multidetector CT imaging of the abdomen and pelvis was performed using the standard protocol following bolus administration of intravenous contrast. CONTRAST:  16mL OMNIPAQUE IOHEXOL 350 MG/ML SOLN COMPARISON:  04/19/2020. FINDINGS: Lower chest: Moderate right pleural effusion with loculation. Visualized portion of the left lower lobe shows a drowned lung appearance in there is associated pleural fluid with adjacent substantial pleural enhancement. Mild to moderate pericardial effusion is similar to mildly decreased compared to chest CT of 08/05/2020. Hepatobiliary: No suspicious focal abnormality within the liver parenchyma. There is no evidence for gallstones, gallbladder wall thickening, or pericholecystic fluid. No intrahepatic or extrahepatic biliary dilation. Pancreas: No focal mass lesion. No dilatation of the  main duct. No intraparenchymal cyst. No peripancreatic edema. Spleen: No splenomegaly. No focal mass lesion. Adrenals/Urinary Tract: Right adrenal gland unremarkable. Similar appearance left adrenal nodule  measuring 2.2 x 1.9 cm today compared to 2.1 x 1.9 cm previously. Kidneys unremarkable. No evidence for hydroureter. The urinary bladder appears normal for the degree of distention. Stomach/Bowel: Stomach is unremarkable. No gastric wall thickening. No evidence of outlet obstruction. Duodenum is normally positioned as is the ligament of Treitz. No small bowel wall thickening. No small bowel dilatation. The terminal ileum is normal. No gross colonic mass. No colonic wall thickening. Vascular/Lymphatic: There is moderate atherosclerotic calcification of the abdominal aorta without aneurysm. There is no gastrohepatic or hepatoduodenal ligament lymphadenopathy. No retroperitoneal or mesenteric lymphadenopathy. No pelvic sidewall lymphadenopathy. Reproductive: Uterus surgically absent.  There is no adnexal mass. Other: No intraperitoneal free fluid. Musculoskeletal: Bones are diffusely demineralized with scattered sclerotic lesions as before. 2.7 cm sclerotic lesion anterior left iliac bone on 43/2 was 2.9 cm previously (remeasured). Right sacral lesion measuring 2.6 cm today was 2.7 cm (remeasured) previously. IMPRESSION: 1. Moderate right pleural effusion with loculation. Visualized portion of the left lower lobe shows a drowned lung appearance in there is associated pleural fluid with adjacent substantial pleural enhancement. These findings are better characterized on previous CT chest 08/05/2020. 2. Mild to moderate pericardial effusion is similar to mildly decreased compared to 08/05/2020. 3. Stable left adrenal nodule.  Metastatic disease not excluded. 4. Similar appearance of scattered sclerotic lesions in the lumbar spine and bony pelvis consistent with metastatic involvement. 5. Aortic Atherosclerosis  (ICD10-I70.0). Electronically Signed   By: Misty Stanley M.D.   On: Sep 20, 2020 09:06   CARDIAC CATHETERIZATION  Result Date: 08/07/2020 Monitor patient closely in CCU under CCM guidance/Management Continue pericardial drain for 2-3 days till effusion is less than 30 ml per day.  DG Chest Portable 1 View  Result Date: 08/18/2020 CLINICAL DATA:  Shortness of breath EXAM: PORTABLE CHEST 1 VIEW COMPARISON:  08/11/2020 FINDINGS: Cardiac shadow is enlarged but stable. Aortic calcifications are noted. Changes of increased vascular congestion and edema are noted. Additionally effusions are seen left greater than right. The right effusion is new from the prior exam. The left effusion is stable. No bony abnormality is noted. IMPRESSION: Increasing changes of CHF with new right-sided pleural effusion. Electronically Signed   By: Inez Catalina M.D.   On: 09/05/2020 21:57   DG Chest Port 1 View  Result Date: 08/10/2020 CLINICAL DATA:  Shortness of breath.  Extubation. EXAM: PORTABLE CHEST 1 VIEW COMPARISON:  08/09/2020 FINDINGS: Endotracheal tube and nasogastric tube have been removed. Again noted is marked consolidation and opacification throughout the left hemithorax with minimal aeration near the left lung apex. Left lung aeration has slightly decreased. Markedly decreased aeration in the right lung with increased patchy interstitial densities in the right lung. Cardiac silhouette is obscured by the lung densities. Negative for pneumothorax. Again noted is a small bore catheter overlying the left side of the heart. IMPRESSION: 1. Decreased aeration in both lungs following removal of the endotracheal tube. Difficult to exclude worsening edema or interstitial lung densities. 2. Stable position of the small bore tube overlying the left side of the chest. Electronically Signed   By: Markus Daft M.D.   On: 08/10/2020 09:02   DG CHEST PORT 1 VIEW  Result Date: 08/09/2020 CLINICAL DATA:  Intubation.  Respiratory  failure. EXAM: PORTABLE CHEST 1 VIEW COMPARISON:  08/07/2020.  CT 08/05/2020. FINDINGS: Endotracheal tube, NG tube in stable position. Cardiomegaly again noted. Bilateral pulmonary infiltrates/edema, improved from prior exam. Large left pleural effusion. No pneumothorax. Degenerative changes scoliosis thoracic spine. IMPRESSION: 1.  Endotracheal  tube and NG tube in stable position. 2. Cardiomegaly. Bilateral pulmonary infiltrates/edema, improved from prior exam. Large left pleural effusion. Electronically Signed   By: Marcello Moores  Register   On: 08/09/2020 07:19   DG CHEST PORT 1 VIEW  Result Date: 08/07/2020 CLINICAL DATA:  75 year old female status post intubation. EXAM: PORTABLE CHEST 1 VIEW COMPARISON:  Chest radiograph dated 08/05/2020 and CT dated 08/05/2020. FINDINGS: Endotracheal tube with tip approximately 17 mm above the carina. Significant interval progression of bilateral pulmonary opacities compared to the prior radiograph likely representing worsening edema/CHF or pneumonia. Probable small bilateral pleural effusions. No pneumothorax. The cardiac borders are silhouetted. Atherosclerotic calcification of the aorta. No acute osseous pathology. Osteopenia. IMPRESSION: 1. Endotracheal tube above the carina. 2. Significant interval progression of bilateral pulmonary opacities likely representing worsening edema/CHF or pneumonia. Electronically Signed   By: Anner Crete M.D.   On: 08/07/2020 19:31   DG Chest Port 1 View  Result Date: 08/05/2020 CLINICAL DATA:  Cough and shortness of breath. EXAM: PORTABLE CHEST 1 VIEW COMPARISON:  February 23, 2020 FINDINGS: Markedly enlarged and globular cardiac silhouette. Calcific atherosclerotic disease of the aorta. Probable left pleural effusion. Left lower lobe airspace disease versus atelectasis. Bilateral interstitial opacities with central predominance. Osseous structures are without acute abnormality. Soft tissues are grossly normal. IMPRESSION: 1. Markedly  enlarged and globular cardiac silhouette. Cardiomyopathy versus enlarging pericardial effusion. 2. Probable left pleural effusion with left lower lobe airspace disease versus atelectasis. 3. Bilateral interstitial opacities with central predominance may represent interstitial pulmonary edema. Electronically Signed   By: Fidela Salisbury M.D.   On: 08/05/2020 17:46   DG Abd Portable 1V  Result Date: 08/08/2020 CLINICAL DATA:  OG tube placement EXAM: PORTABLE ABDOMEN - 1 VIEW COMPARISON:  CT 04/19/2020, chest x-ray 08/07/2020 FINDINGS: Esophageal tube tip and side-port presumably project over the gastric body. Mild diffuse air-filled bowel without definitive obstructive pattern. Catheter tubing projects over the left lower chest central abdomen and pelvis. Sclerotic lesion within the left anterior iliac spine and left pubic symphysis. IMPRESSION: 1. Esophageal tube tip and side port overlie the gastric body 2. Mild diffuse increased bowel gas without definitive obstructive pattern 3. Osseous sclerotic metastatic disease. Electronically Signed   By: Donavan Foil M.D.   On: 08/08/2020 16:13   ECHOCARDIOGRAM COMPLETE  Result Date: 08/06/2020    ECHOCARDIOGRAM REPORT   Patient Name:   NOBIE ALLEYNE Date of Exam: 08/06/2020 Medical Rec #:  [id number]      Height:       64.0 in Accession #:    [id number]     Weight:       142.9 lb Date of Birth:  September 09, 1945      BSA:          1.696 m Patient Age:    75 years       BP:           164/72 mmHg Patient Gender: F              HR:           101 bpm. Exam Location:  Inpatient Procedure: 2D Echo, Cardiac Doppler and Color Doppler Indications:     I31.3 Pericardial effusion  History:         Patient has prior history of Echocardiogram examinations, most                  recent 01/26/2020. Risk Factors:Hypertension and Diabetes.  Cancer.  Sonographer:     Jonelle Sidle Dance Referring Phys:  [id number] Elmarie Shiley Diagnosing Phys: Dixie Dials MD IMPRESSIONS  1.  Left ventricular ejection fraction, by estimation, is 60 to 65%. The left ventricle has normal function. The left ventricle has no regional wall motion abnormalities. Left ventricular diastolic parameters are consistent with Grade I diastolic dysfunction (impaired relaxation).  2. Right ventricular systolic function is normal. The right ventricular size is normal.  3. Moderate pericardial effusion. The pericardial effusion is circumferential. There is no evidence of cardiac tamponade.  4. The mitral valve is degenerative. Mild mitral valve regurgitation.  5. The aortic valve is tricuspid. There is mild calcification of the aortic valve. There is mild thickening of the aortic valve. Aortic valve regurgitation is not visualized. Mild aortic valve sclerosis is present, with no evidence of aortic valve stenosis.  6. The inferior vena cava is dilated in size with <50% respiratory variability, suggesting right atrial pressure of 15 mmHg. 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 FINDINGS  Left Ventricle: Left ventricular ejection fraction, by estimation, is 60 to 65%. The left ventricle has normal function. The left ventricle has no regional wall motion abnormalities. The left ventricular internal cavity size was normal in size. There is  borderline concentric left ventricular hypertrophy. Left ventricular diastolic parameters are consistent with Grade I diastolic dysfunction (impaired relaxation). Right Ventricle: The right ventricular size is normal. No increase in right ventricular wall thickness. Right ventricular systolic function is normal. Left Atrium: Left atrial size was normal in size. Right Atrium: Right atrial size was normal in size. Pericardium: A moderately sized pericardial effusion is present. The pericardial effusion is circumferential. The pericardial effusion appears to contain mixed echogenic material. There is no evidence of cardiac tamponade.  Mitral Valve: The mitral valve is degenerative in appearance. There is mild thickening of the mitral valve leaflet(s). There is mild calcification of the mitral valve leaflet(s). Mild mitral annular calcification. Mild mitral valve regurgitation. Tricuspid Valve: The tricuspid valve is normal in structure. Tricuspid valve regurgitation is trivial. Aortic Valve: The aortic valve is tricuspid. There is mild calcification of the aortic valve. There is mild thickening of the aortic valve. Aortic valve regurgitation is not visualized. Mild aortic valve sclerosis is present, with no evidence of aortic valve stenosis. Pulmonic Valve: The pulmonic valve was normal in structure. Pulmonic valve regurgitation is not visualized. Aorta: The aortic root is normal in size and structure. There is minimal (Grade I) atheroma plaque involving the ascending aorta. Venous: The inferior vena cava is dilated in size with less than 50% respiratory variability, suggesting right atrial pressure of 15 mmHg. IAS/Shunts: The atrial septum is grossly normal.  LEFT VENTRICLE PLAX 2D LVIDd:         3.70 cm  Diastology LVIDs:         2.10 cm  LV e' medial:    4.46 cm/s LV PW:         1.20 cm  LV E/e' medial:  17.7 LV IVS:        1.10 cm  LV e' lateral:   4.68 cm/s LVOT diam:     1.90 cm  LV E/e' lateral: 16.9 LV SV:         52 LV SV Index:   31 LVOT Area:     2.84 cm  RIGHT VENTRICLE             IVC RV Basal diam:  2.50 cm  IVC diam: 2.20 cm RV S prime:     10.40 cm/s TAPSE (M-mode): 1.8 cm LEFT ATRIUM             Index       RIGHT ATRIUM          Index LA diam:        4.20 cm 2.48 cm/m  RA Area:     9.54 cm LA Vol (A2C):   66.9 ml 39.45 ml/m RA Volume:   19.60 ml 11.56 ml/m LA Vol (A4C):   25.8 ml 15.21 ml/m LA Biplane Vol: 42.6 ml 25.12 ml/m  AORTIC VALVE LVOT Vmax:   97.00 cm/s LVOT Vmean:  66.700 cm/s LVOT VTI:    0.185 m  AORTA Ao Root diam: 2.80 cm Ao Asc diam:  2.70 cm MITRAL VALVE MV Area (PHT): 5.13 cm     SHUNTS MV Decel Time:  148 msec     Systemic VTI:  0.18 m MV E velocity: 79.00 cm/s   Systemic Diam: 1.90 cm MV A velocity: 138.00 cm/s MV E/A ratio:  0.57 Dixie Dials MD Electronically signed by Dixie Dials MD Signature Date/Time: 08/06/2020/12:02:39 PM    Final    ECHOCARDIOGRAM LIMITED  Result Date: 08/15/2020    ECHOCARDIOGRAM LIMITED REPORT   Patient Name:   [name] Date of Exam: 08/15/2020 Medical Rec #:  [id number]      Height:       64.0 in Accession #:    [id number]     Weight:       134.9 lb Date of Birth:  July 19, 1945      BSA:          1.655 m Patient Age:    61 years       BP:           135/55 mmHg Patient Gender: F              HR:           88 bpm. Exam Location:  Inpatient Procedure: Limited Echo and Limited Color Doppler Indications:    I31.3 Pericardial effusion (noninflammatory)  History:        Patient has prior history of Echocardiogram examinations, most                 recent 08/10/2020. Signs/Symptoms:Dyspnea; Risk                 Factors:Hypertension and Dyslipidemia. Cancer. Pericarditis.  Sonographer:    Jonelle Sidle Dance Referring Phys: [place]  1. Left ventricular ejection fraction, by estimation, is 55 to 60%. The left ventricle has normal function. The left ventricle has no regional wall motion abnormalities. There is mild concentric left ventricular hypertrophy of the anterior, septal and posterior segments. Left ventricular diastolic function could not be evaluated.  2. Right ventricular systolic function is normal. The right ventricular size is normal.  3. Left atrial size was moderately dilated.  4. A small pericardial effusion is present. The pericardial effusion is circumferential. There is no evidence of cardiac tamponade.  5. The mitral valve is degenerative. Mild mitral valve regurgitation.  6. The aortic valve is tricuspid. There is mild calcification of the aortic valve. There is mild thickening of the aortic valve. Aortic valve regurgitation is not visualized. Mild  aortic valve sclerosis is present, with no evidence of aortic valve stenosis.  7. There is mild (Grade II) atheroma plaque involving the ascending aorta.  8. The inferior vena cava  is dilated in size with >50% respiratory variability, suggesting right atrial pressure of 8 mmHg. FINDINGS  Left Ventricle: Left ventricular ejection fraction, by estimation, is 55 to 60%. The left ventricle has normal function. The left ventricle has no regional wall motion abnormalities. The left ventricular internal cavity size was normal in size. There is  mild concentric left ventricular hypertrophy of the anterior, septal and posterior segments. Left ventricular diastolic function could not be evaluated. Right Ventricle: The right ventricular size is normal. No increase in right ventricular wall thickness. Right ventricular systolic function is normal. Left Atrium: Left atrial size was moderately dilated. Right Atrium: Right atrial size was normal in size. Pericardium: A small pericardial effusion is present. The pericardial effusion is circumferential. The pericardial effusion appears to contain fibrous material. There is no evidence of cardiac tamponade. Mitral Valve: The mitral valve is degenerative in appearance. Mild mitral valve regurgitation. Tricuspid Valve: The tricuspid valve is normal in structure. Tricuspid valve regurgitation is mild. Aortic Valve: The aortic valve is tricuspid. There is mild calcification of the aortic valve. There is mild thickening of the aortic valve. There is mild aortic valve annular calcification. Aortic valve regurgitation is not visualized. Mild aortic valve sclerosis is present, with no evidence of aortic valve stenosis. Pulmonic Valve: The pulmonic valve was normal in structure. Pulmonic valve regurgitation is not visualized. Aorta: The aortic root is normal in size and structure. There is mild (Grade II) atheroma plaque involving the ascending aorta. Venous: The inferior vena cava is dilated  in size with greater than 50% respiratory variability, suggesting right atrial pressure of 8 mmHg. IAS/Shunts: The interatrial septum was not assessed. Additional Comments: There is a small pleural effusion in both left and right lateral regions. LEFT VENTRICLE PLAX 2D LVIDd:         3.00 cm LVIDs:         2.20 cm LV PW:         1.30 cm LV IVS:        1.20 cm LVOT diam:     1.50 cm LVOT Area:     1.77 cm  IVC IVC diam: 2.10 cm LEFT ATRIUM         Index LA diam:    3.70 cm 2.24 cm/m   AORTA Ao Root diam: 3.00 cm Ao Asc diam:  2.50 cm  SHUNTS Systemic Diam: 1.50 cm Dixie Dials MD Electronically signed by Dixie Dials MD Signature Date/Time: 08/15/2020/4:58:15 PM    Final    ECHOCARDIOGRAM LIMITED  Result Date: 08/10/2020    ECHOCARDIOGRAM LIMITED REPORT   Patient Name:   [name] Date of Exam: 08/10/2020 Medical Rec #:  [id number]      Height:       64.0 in Accession #:    [id number]     Weight:       138.4 lb Date of Birth:  03-14-1945      BSA:          1.673 m Patient Age:    18 years       BP:           129/56 mmHg Patient Gender: F              HR:           89 bpm. Exam Location:  Inpatient Procedure: 2D Echo, Color Doppler and Cardiac Doppler Indications:     Pericardial effusion  History:         Patient has  prior history of Echocardiogram examinations, most                  recent 08/08/2020. Signs/Symptoms:Shortness of Breath. Cancer.                  Pericarditis.  Sonographer:     Clayton Lefort RDCS (AE) Referring Phys:  [place] Diagnosing Phys: Dixie Dials MD IMPRESSIONS  1. Left ventricular ejection fraction, by estimation, is 60 to 65%. The left ventricle has normal function. The left ventricle has no regional wall motion abnormalities. There is moderate left ventricular hypertrophy. Left ventricular diastolic function  could not be evaluated.  2. Right ventricular systolic function is normal. The right ventricular size is normal.  3. Left atrial size was moderately dilated.  4. Right  atrial size was moderately dilated.  5. A small pericardial effusion is present. The pericardial effusion is localized near the right ventricle, localized near the right atrium and surrounding the apex. There is no evidence of cardiac tamponade.  6. The mitral valve is degenerative. Mild mitral valve regurgitation.  7. The aortic valve is tricuspid. There is mild calcification of the aortic valve. There is mild thickening of the aortic valve. Mild to moderate aortic valve sclerosis/calcification is present, without any evidence of aortic stenosis.  8. There is mild (Grade II) atheroma plaque involving the aortic root.  9. The inferior vena cava is normal in size with <50% respiratory variability, suggesting right atrial pressure of 8 mmHg. FINDINGS  Left Ventricle: Left ventricular ejection fraction, by estimation, is 60 to 65%. The left ventricle has normal function. The left ventricle has no regional wall motion abnormalities. There is moderate left ventricular hypertrophy. Left ventricular diastolic function could not be evaluated. Right Ventricle: The right ventricular size is normal. No increase in right ventricular wall thickness. Right ventricular systolic function is normal. Left Atrium: Left atrial size was moderately dilated. Right Atrium: Right atrial size was moderately dilated. Pericardium: A small pericardial effusion is present. The pericardial effusion is localized near the right ventricle, localized near the right atrium and surrounding the apex. There is no evidence of cardiac tamponade. Mitral Valve: The mitral valve is degenerative in appearance. There is mild thickening of the mitral valve leaflet(s). There is mild calcification of the mitral valve leaflet(s). Mild mitral annular calcification. Mild mitral valve regurgitation. Tricuspid Valve: The tricuspid valve is normal in structure. Tricuspid valve regurgitation is mild. Aortic Valve: The aortic valve is tricuspid. There is mild  calcification of the aortic valve. There is mild thickening of the aortic valve. There is mild aortic valve annular calcification. Mild to moderate aortic valve sclerosis/calcification is present, without any evidence of aortic stenosis. Pulmonic Valve: The pulmonic valve was normal in structure. Aorta: The aortic root is normal in size and structure. There is mild (Grade II) atheroma plaque involving the aortic root. Venous: The inferior vena cava is normal in size with less than 50% respiratory variability, suggesting right atrial pressure of 8 mmHg. IAS/Shunts: The interatrial septum was not assessed. Dixie Dials MD Electronically signed by Dixie Dials MD Signature Date/Time: 08/10/2020/1:26:34 PM    Final    ECHOCARDIOGRAM LIMITED  Result Date: 08/08/2020    ECHOCARDIOGRAM LIMITED REPORT   Patient Name:   NZINGA FERRAN Date of Exam: 08/08/2020 Medical Rec #:  [id number]      Height:       64.0 in Accession #:    [id number]     Weight:  133.3 lb Date of Birth:  [birth year]      BSA:          1.647 m Patient Age:    58 years       BP:           116/47 mmHg Patient Gender: F              HR:           85 bpm. Exam Location:  Inpatient Procedure: Limited Echo Indications:     Pericardial effusion post pericardial centesis  History:         Patient has prior history of Echocardiogram examinations, most                  recent 08/06/2020. Signs/Symptoms:Shortness of Breath. Cancer.                  Pericarditis.  Sonographer:     [name] RDCS Referring Phys:  [place] Diagnosing Phys: Dixie Dials MD IMPRESSIONS  1. Left ventricular ejection fraction, by estimation, is 65 to 70%. The left ventricle has normal function. The left ventricle has no regional wall motion abnormalities. There is mild concentric left ventricular hypertrophy.  2. Right ventricular systolic function is normal.  3. Left atrial size was mildly dilated.  4. Right atrial size was mildly dilated.  5. Sucessful pericardiocentesis.   6. The mitral valve is degenerative.  7. The aortic valve is tricuspid. There is mild calcification of the aortic valve. There is mild thickening of the aortic valve.  8. There is mild (Grade II) atheroma plaque involving the ascending aorta. FINDINGS  Left Ventricle: Left ventricular ejection fraction, by estimation, is 65 to 70%. The left ventricle has normal function. The left ventricle has no regional wall motion abnormalities. There is mild concentric left ventricular hypertrophy. Right Ventricle: Right ventricular systolic function is normal. Left Atrium: Left atrial size was mildly dilated. Right Atrium: Right atrial size was mildly dilated. Pericardium: Sucessful pericardiocentesis. There is no evidence of pericardial effusion. Mitral Valve: The mitral valve is degenerative in appearance. Tricuspid Valve: The tricuspid valve is normal in structure. Aortic Valve: The aortic valve is tricuspid. There is mild calcification of the aortic valve. There is mild thickening of the aortic valve. There is mild aortic valve annular calcification. Pulmonic Valve: The pulmonic valve was normal in structure. Aorta: The aortic root is normal in size and structure. There is mild (Grade II) atheroma plaque involving the ascending aorta. Venous: The inferior vena cava was not well visualized. Dixie Dials MD Electronically signed by Dixie Dials MD Signature Date/Time: 08/08/2020/3:22:53 PM    Final     Microbiology Recent Results (from the past 240 hour(s))  Resp Panel by RT-PCR (Flu A&B, Covid) Nasopharyngeal Swab     Status: None   Collection Time: 09/02/2020  9:42 PM   Specimen: Nasopharyngeal Swab; Nasopharyngeal(NP) swabs in vial transport medium  Result Value Ref Range Status   SARS Coronavirus 2 by RT PCR NEGATIVE NEGATIVE Final    Comment: (NOTE) SARS-CoV-2 target nucleic acids are NOT DETECTED.  The SARS-CoV-2 RNA is generally detectable in upper respiratory specimens during the acute phase of infection.  The lowest concentration of SARS-CoV-2 viral copies this assay can detect is 138 copies/mL. A negative result does not preclude SARS-Cov-2 infection and should not be used as the sole basis for treatment or other patient management decisions. A negative result may occur with  improper specimen collection/handling, submission of  specimen other than nasopharyngeal swab, presence of viral mutation(s) within the areas targeted by this assay, and inadequate number of viral copies(<138 copies/mL). A negative result must be combined with clinical observations, patient history, and epidemiological information. The expected result is Negative.  Fact Sheet for Patients:  EntrepreneurPulse.com.au  Fact Sheet for Healthcare Providers:  IncredibleEmployment.be  This test is no t yet approved or cleared by the [place] FDA and  has been authorized for detection and/or diagnosis of SARS-CoV-2 by FDA under an Emergency Use Authorization (EUA). This EUA will remain  in effect (meaning this test can be used) for the duration of the COVID-19 declaration under Section 564(b)(1) of the Act, 21 U.S.C.section 360bbb-3(b)(1), unless the authorization is terminated  or revoked sooner.       Influenza A by PCR NEGATIVE NEGATIVE Final   Influenza B by PCR NEGATIVE NEGATIVE Final    Comment: (NOTE) The Xpert Xpress SARS-CoV-2/FLU/RSV plus assay is intended as an aid in the diagnosis of influenza from Nasopharyngeal swab specimens and should not be used as a sole basis for treatment. Nasal washings and aspirates are unacceptable for Xpert Xpress SARS-CoV-2/FLU/RSV testing.  Fact Sheet for Patients: EntrepreneurPulse.com.au  Fact Sheet for Healthcare Providers: IncredibleEmployment.be  This test is not yet approved or cleared by the [place] FDA and has been authorized for detection and/or diagnosis of SARS-CoV-2 by FDA  under an Emergency Use Authorization (EUA). This EUA will remain in effect (meaning this test can be used) for the duration of the COVID-19 declaration under Section 564(b)(1) of the Act, 21 U.S.C. section 360bbb-3(b)(1), unless the authorization is terminated or revoked.  Performed at [facility], [place] [address] [place], [place] [address]   MRSA Next Gen by PCR, Nasal     Status: None   Collection Time: 08/28/20  3:21 AM   Specimen: Nasal Mucosa; Nasal Swab  Result Value Ref Range Status   MRSA by PCR Next Gen NOT DETECTED NOT DETECTED Final    Comment: (NOTE) The GeneXpert MRSA Assay (FDA approved for NASAL specimens only), is one component of a comprehensive MRSA colonization surveillance program. It is not intended to diagnose MRSA infection nor to guide or monitor treatment for MRSA infections. Test performance is not FDA approved in patients less than 46 years old. Performed at [facility], [place] [address] [place], [place] [address]     Lab Basic Metabolic Panel: Recent Labs  Lab 09/03/2020 2142 08/28/20 0314 09-09-2020 0238  NA 141 142 141  K 3.5 3.4* 3.1*  CL 99 100 100  CO2 30 30 31   GLUCOSE 208* 152* 87  BUN 16 17 17   CREATININE 0.71 0.59 0.61  CALCIUM 9.4 9.4 8.8*  MG  --  1.8 1.8   Liver Function Tests: Recent Labs  Lab September 09, 2020 0238  AST 42*  ALT 35  ALKPHOS 124  BILITOT 1.0  PROT 6.3*  ALBUMIN 2.6*   No results for input(s): LIPASE, AMYLASE in the last 168 hours. No results for input(s): AMMONIA in the last 168 hours. CBC: Recent Labs  Lab 09/11/2020 2142 Sep 09, 2020 0238  WBC 11.2* 8.4  NEUTROABS 9.8* 6.5  HGB 12.0 11.4*  HCT 39.4 37.7  MCV 92.5 92.0  PLT 389 290   Cardiac Enzymes: No results for input(s): CKTOTAL, CKMB, CKMBINDEX, TROPONINI in the last 168 hours. Sepsis Labs: Recent Labs  Lab 09/09/2020 2142 09/09/20 0238  WBC 11.2* 8.4    Procedures/Operations  Right chest tube  Roselie Awkward 08/24/2020, 2:21 PM

## 2020-09-17 NOTE — Progress Notes (Signed)
68ml of Morphine gtt was wasted. Witnessed waste with Danielle Rankin, RN

## 2020-09-17 NOTE — Progress Notes (Signed)
I was present with Sara Ferguson and her nurses as she passed peacefully and provided grief support to her niece when she arrived.  [place], Bcc Pager, [phone] 4:59 PM

## 2020-09-17 NOTE — Progress Notes (Signed)
CCM Brief Progress Note  Full note to follow   75yo F stg IV adenocarcinoma.  We discussed her clinical course, and specifically we discussed palliative medicine and hospice. She asked if palliative medicine was suicide -- I explained very clearly it is not. We discussed the priority of symptom management in palliative and hospice medicine, and that the aim is not to cause death -- but in focusing on treating symptoms like pain and shortness of breath toward the end of life, death may meet [place] sooner than with aggressive interventions for the cause of pain and shortness of breath.   She feels she has a greater understanding of palliative and hospice medicine now. She states " I am ready for the pain medicine now." I clarified if she is requesting that we shift our efforts toward palliative and comfort care, to which she replies yes.   Ms. Hammitt requested that we try oral pain and anxiety medication before IV. I have ordered oxy 5mg  q4hr PRN and ativan 0.5mg  q4 PRN. We can increase these and/or shift to IV medication as needed.   I asked Ms. Ransdell if she would be interested in a hospice facility or home hospice-- to which she said "No. My family can't deal with it." I asked how she would feel if she passed away during this hospitalization, in the hospital to which she said "That's fine with me, I'm ready for the pain medicine now."    I will notify my collaborating critical care physician as well as the palliative care team.     Eliseo Gum MSN, AGACNP-BC [place]  Sep 10, 2020, 9:15 AM

## 2020-09-17 DEATH — deceased

## 2022-10-25 IMAGING — DX DG CHEST 1V
1 series · 1 of 1 positions shown · non-contrast
Comparison: 01/02/2020

CLINICAL DATA: Left thoracentesis, pleural effusions

EXAM:
CHEST  1 VIEW

[chest ap]
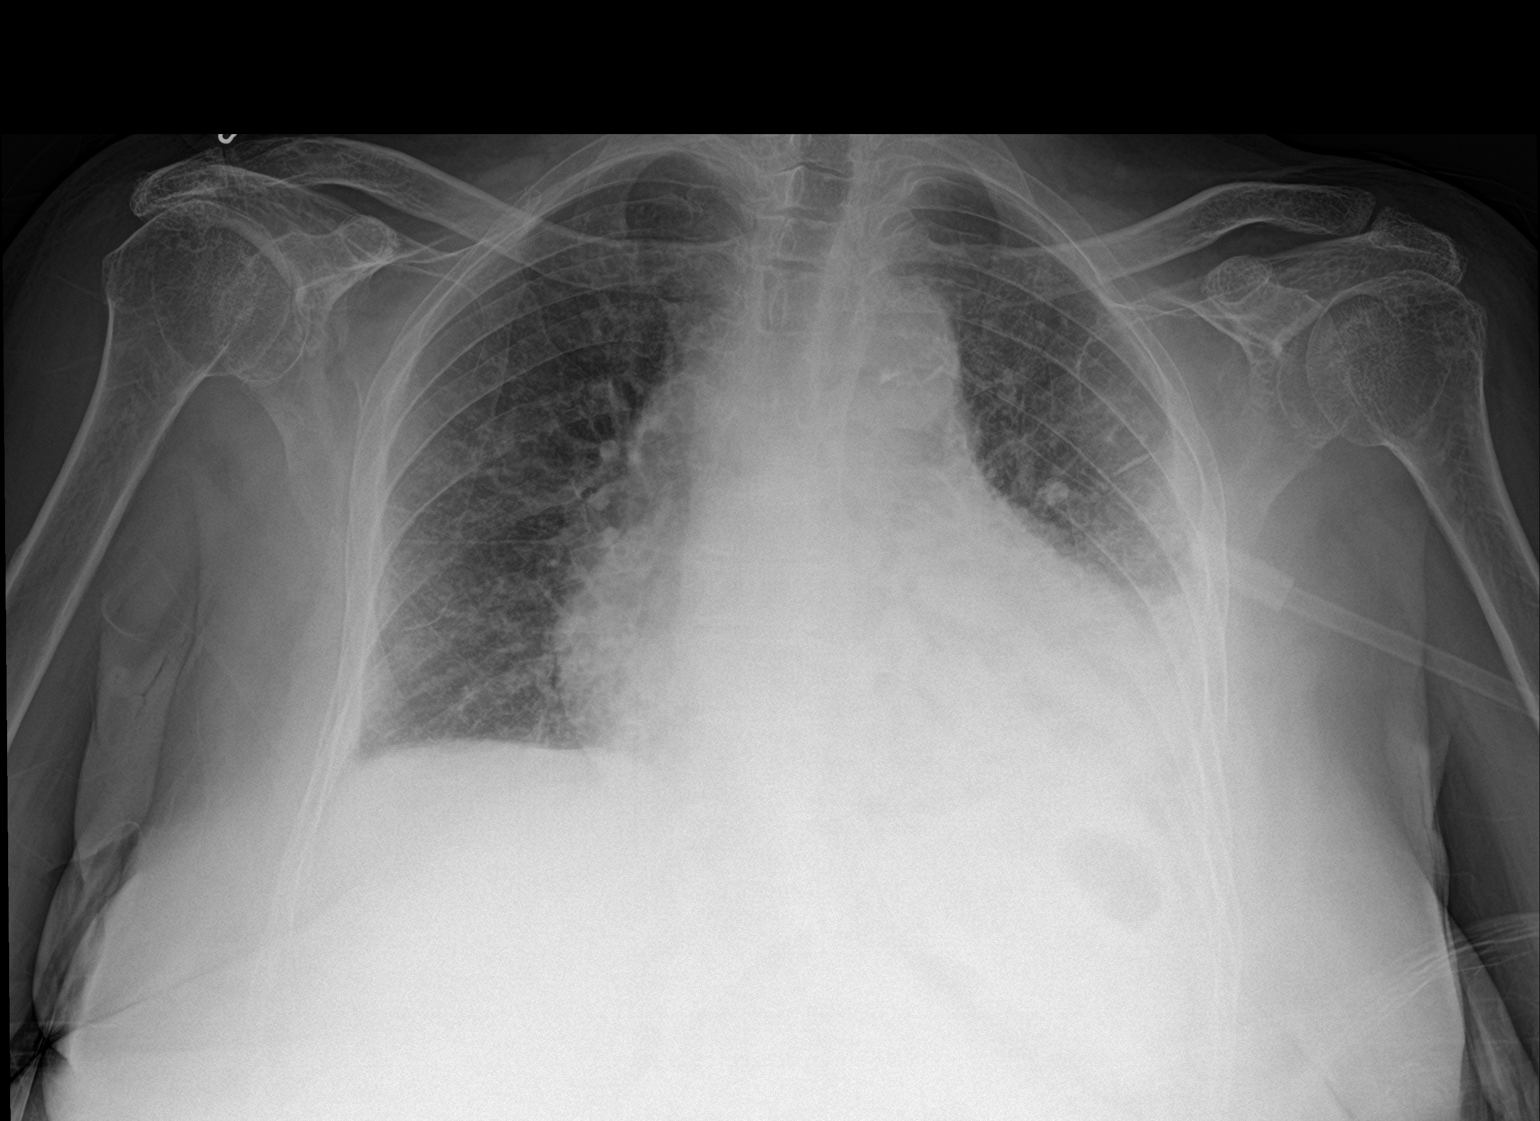

[1 of 1 positions shown; findings below may reference images not displayed]

FINDINGS: Notable reduction in size of the left pleural effusion. No
significant pneumothorax. Stable enlargement of the
cardiopericardial silhouette. Atherosclerotic calcification of the
aortic arch. Bony demineralization.
IMPRESSION: 1. Notable reduction in size of the left pleural effusion, status
post thoracentesis. No significant pneumothorax.
2. Stable enlargement of the cardiopericardial silhouette.

## 2022-10-25 IMAGING — US US THORACENTESIS ASP PLEURAL SPACE W/IMG GUIDE
1 series · 4 of 4 positions shown · non-contrast
Comparison: none

INDICATION: Left pleural effusion

[Series 1: us thoracentesis asp pleural space w/img guide · 4 of 4 slices shown]
[im 1/4]
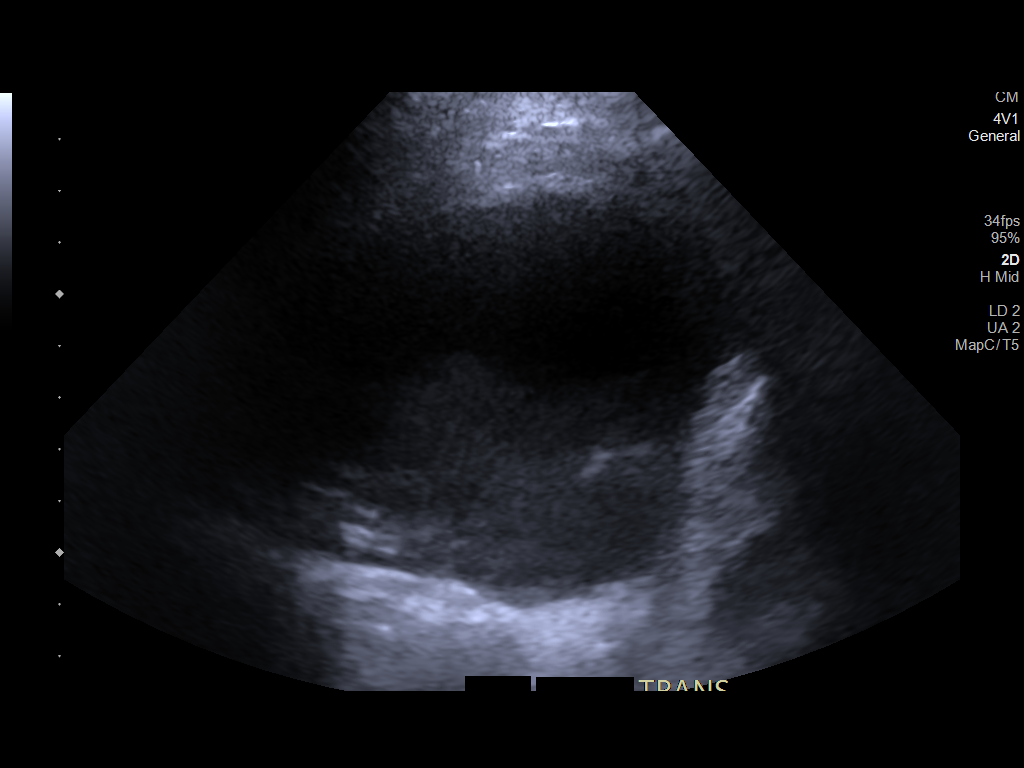
[im 2/4]
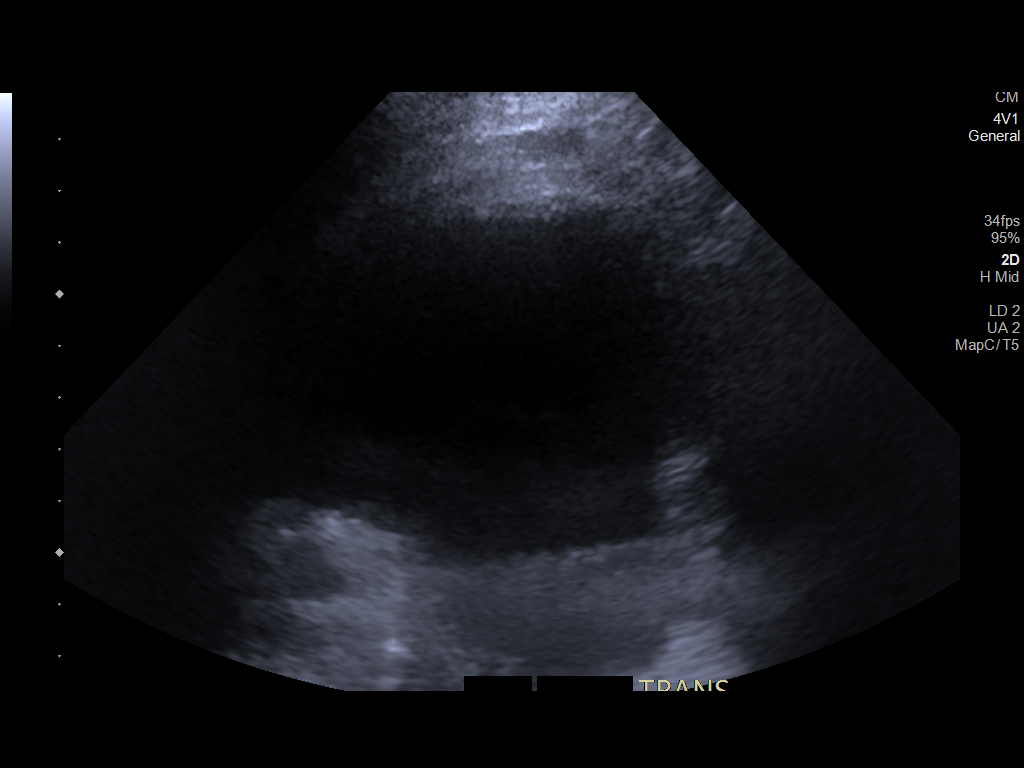
[im 3/4]
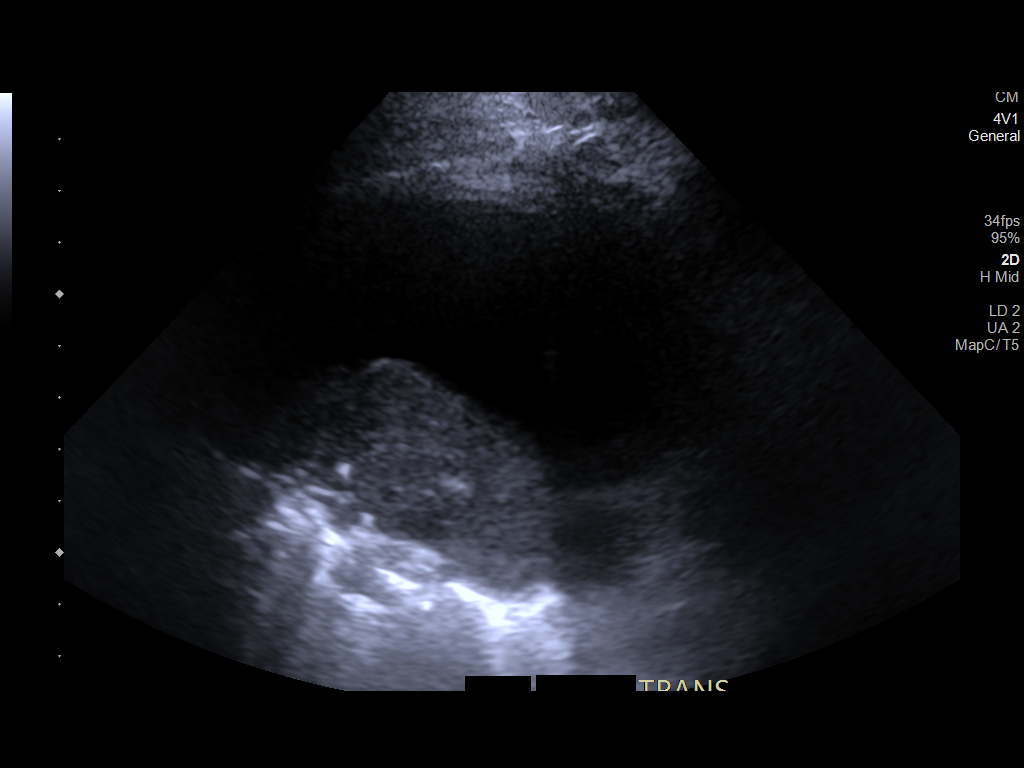
[im 4/4]
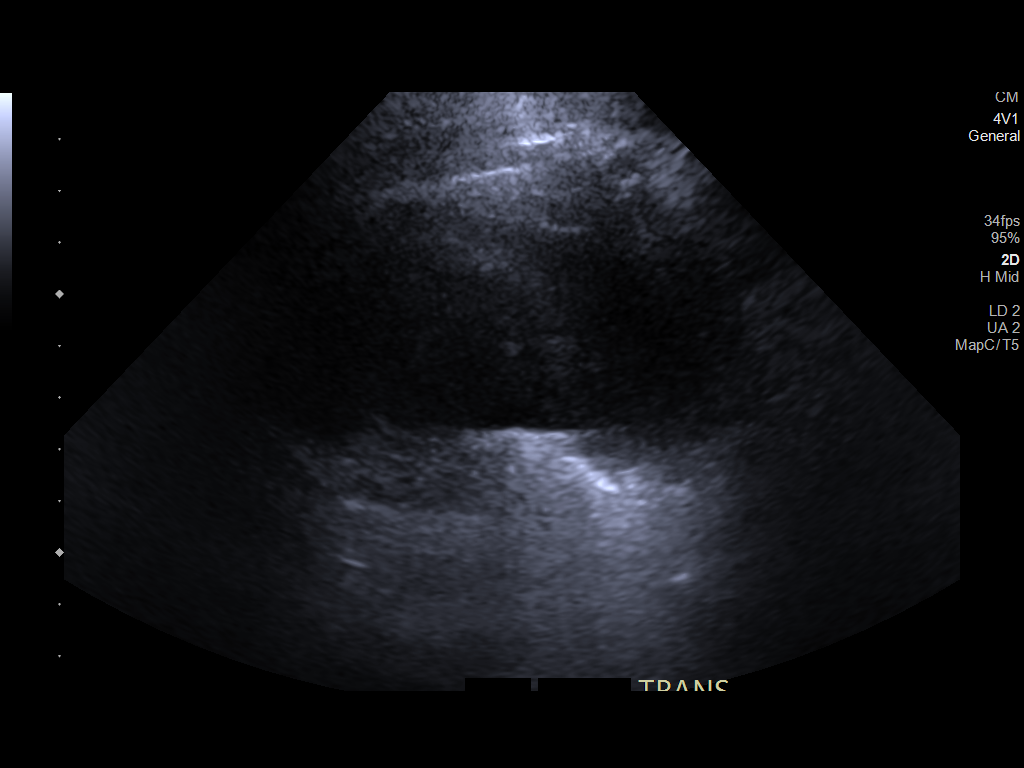

[4 of 4 positions shown; findings below may reference images not displayed]

Known Non small cell lung cancer

EXAM:
ULTRASOUND GUIDED LEFT THORACENTESIS

MEDICATIONS:
10 cc 1% lidocaine.

COMPLICATIONS:
None immediate.

PROCEDURE:
An ultrasound guided thoracentesis was thoroughly discussed with the
patient and questions answered. The benefits, risks, alternatives
and complications were also discussed. The patient understands and
wishes to proceed with the procedure. Written consent was obtained.

Ultrasound was performed to localize and mark an adequate pocket of
fluid in the left chest. The area was then prepped and draped in the
normal sterile fashion. 1% Lidocaine was used for local anesthesia.
Under ultrasound guidance a 19 G Yueh catheter was introduced.
Thoracentesis was performed. The catheter was removed and a dressing
applied.
FINDINGS: A total of approximately 850 cc of serosanguinous fluid was removed.
Samples were sent to the laboratory as requested by the clinical
team.
IMPRESSION: Successful ultrasound guided left thoracentesis yielding 850 cc of
pleural fluid.

Postprocedural chest radiograph showed no pneumothorax.

Read by

Gopal Benavides

## 2022-10-30 IMAGING — CT NM PET TUM IMG INITIAL (PI) SKULL BASE T - THIGH
7 series · 25 of 25 positions shown · non-contrast
Comparison: Chest CT on 03/06/2019

CLINICAL DATA: Initial treatment strategy for non-small cell lung
carcinoma.

EXAM:
NUCLEAR MEDICINE PET SKULL BASE TO THIGH
TECHNIQUE: 7.6 mCi F-18 FDG was injected intravenously. Full-ring PET imaging
was performed from the skull base to thigh after the radiotracer. CT
data was obtained and used for attenuation correction and anatomic
localization.
Fasting blood glucose: 229 mg/dl

[Series 3: pet sk_thigh ac · axial · 5.0mm · 4.07mm/px · z∈[-1067,-199]mm · 5 of 218 slices shown]
[im 1/218]
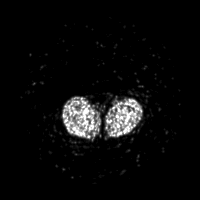
[im 55/218]
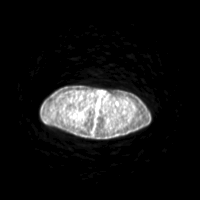
[im 109/218]
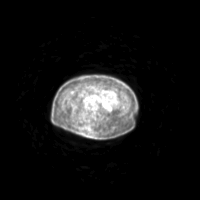
[im 163/218]
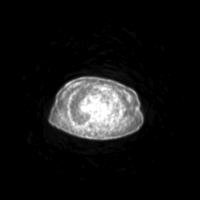
[im 218/218]
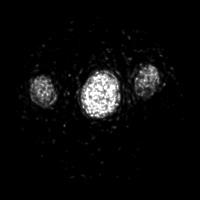

[Series 4: ct sk_thigh 5.0 bf37 · axial · 5.0mm · 0.98mm/px · z∈[-1067,-199]mm · 5 of 218 slices shown]
[im 1/218]
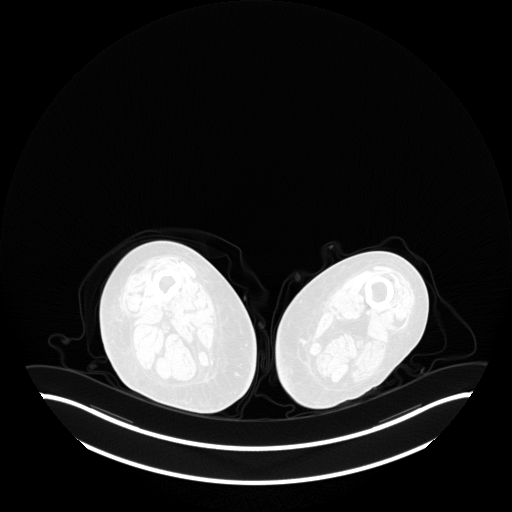
[im 55/218]
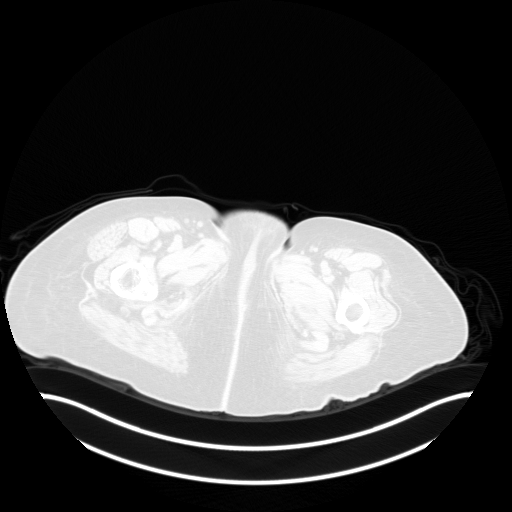
[im 109/218]
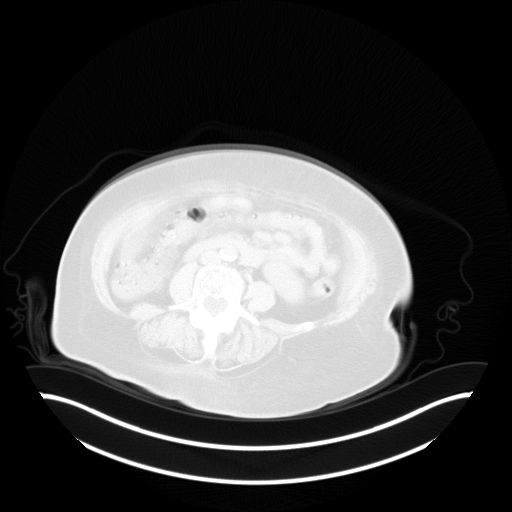
[im 163/218]
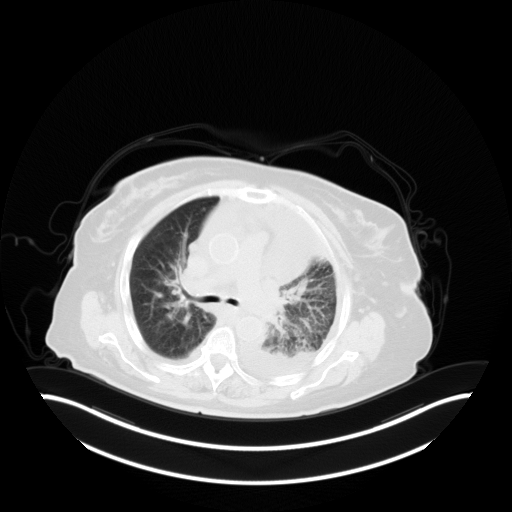
[im 218/218  brain]
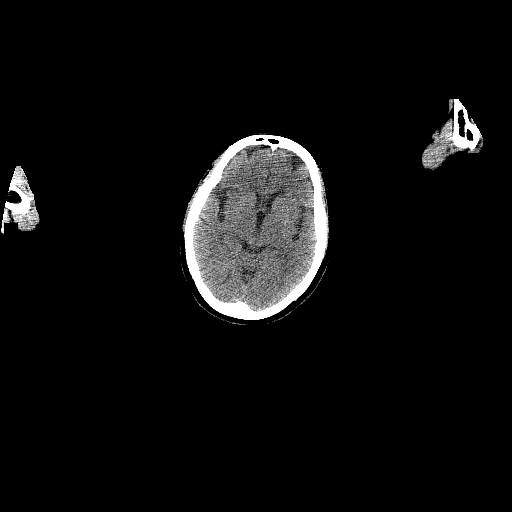

[Series 5: pet sk_thigh nac · axial · 5.0mm · 4.07mm/px · z∈[-1067,-199]mm · 6 of 218 slices shown]
[im 1/218]
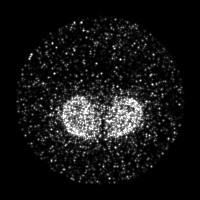
[im 44/218]
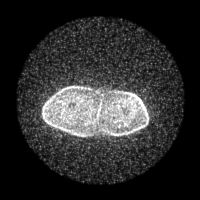
[im 87/218]
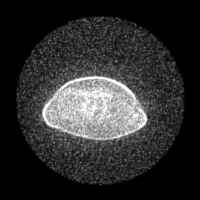
[im 131/218]
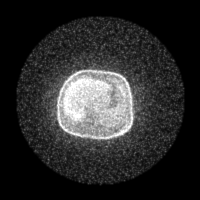
[im 174/218]
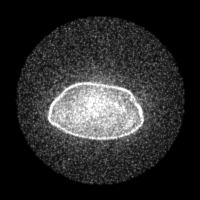
[im 218/218]
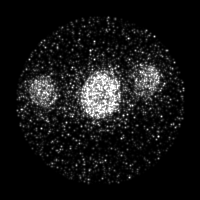

[Series 8: ct sk_thigh 5.0 br59 lung_bone · axial · 5.0mm · 0.64mm/px · z∈[-547,-303]mm · 2 of 62 slices shown]
[im 1/62]
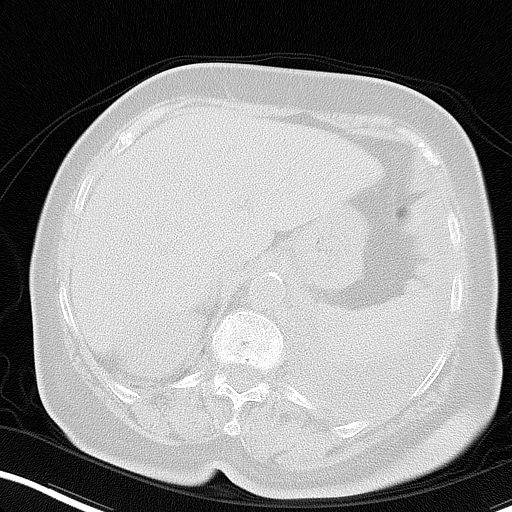
[im 62/62]
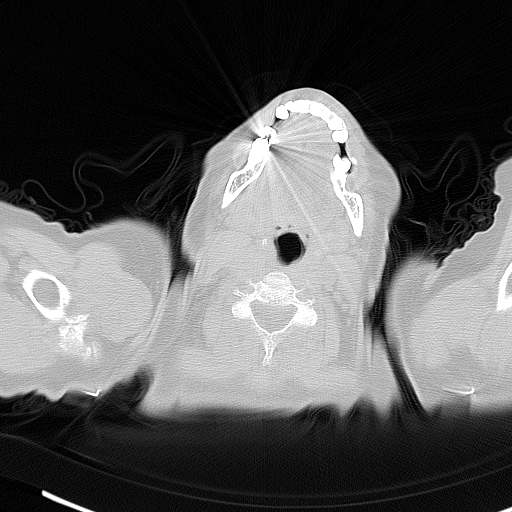

[Series 603: <mip collection> · coronal · 1.80mm/px · 1 of 32 slices shown]
[im 1/32]
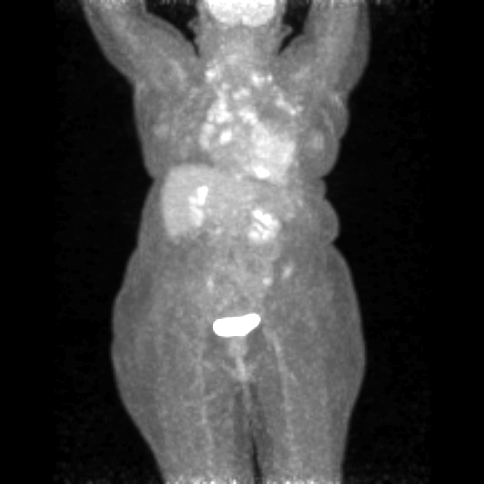

[Series 604: fused cor · 1 of 30 slices shown]
[im 1/30]
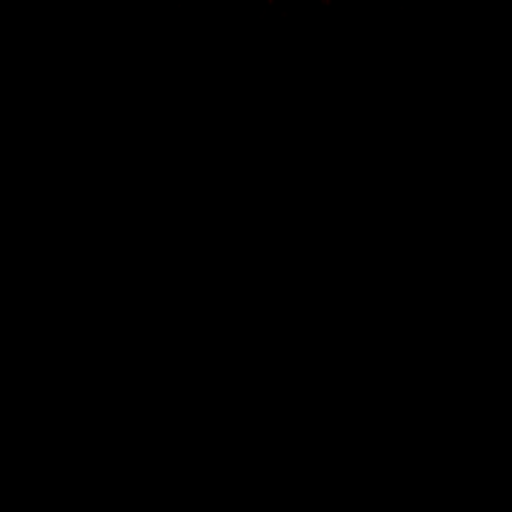

[Series 605: range-ct sk_thigh 5.0 bf37-tra-<alpha range> · 5 of 210 slices shown]
[im 1/210]
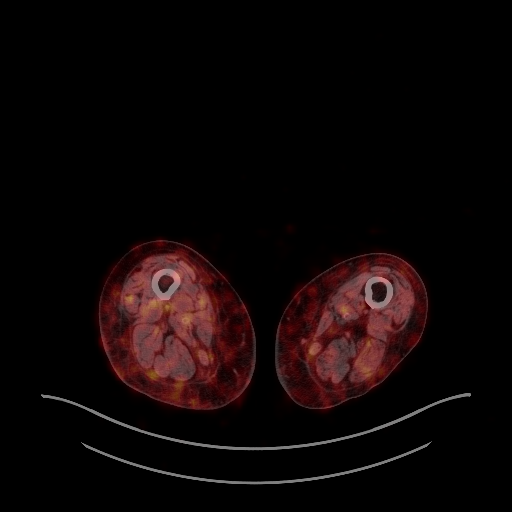
[im 53/210]
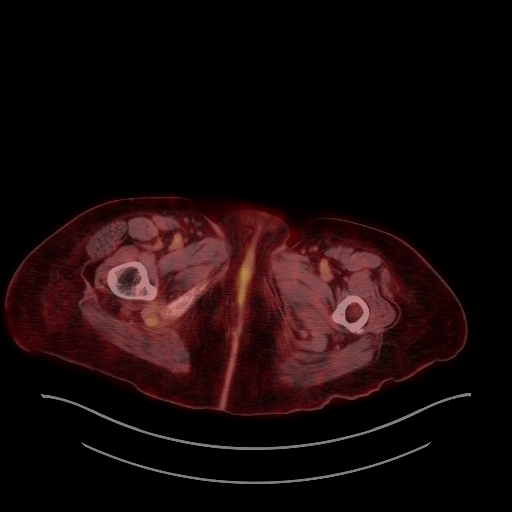
[im 105/210]
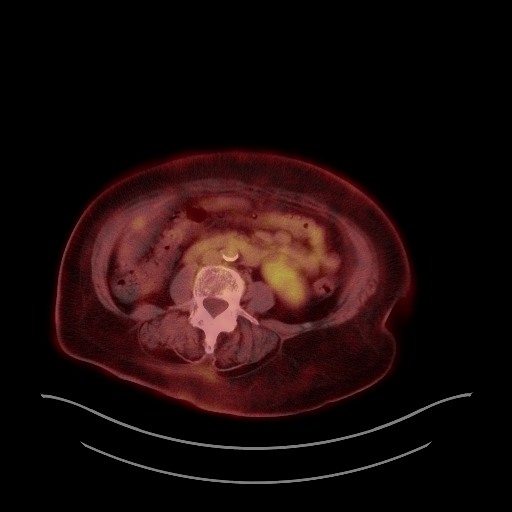
[im 157/210]
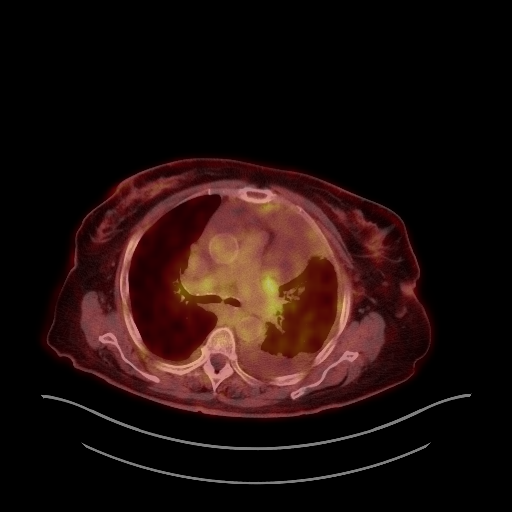
[im 210/210]
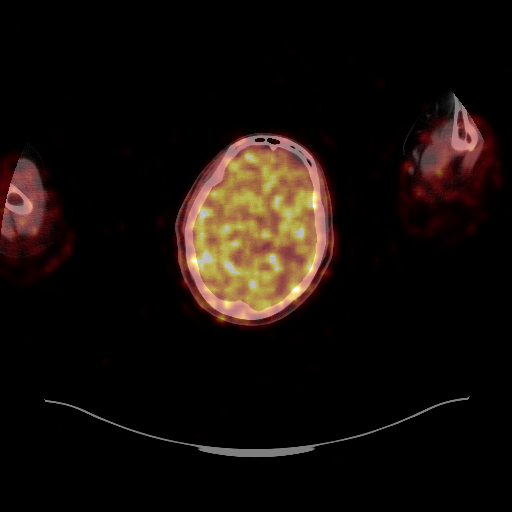

[25 of 25 positions shown; findings below may reference images not displayed]

FINDINGS: Mediastinal blood-pool activity (background): SUV max =

Liver activity (reference): SUV max = N/A

NECK: Hypermetabolic lymphadenopathy seen in the lower jugular
chains bilaterally, and bilateral supraclavicular regions.

Incidental CT findings:  None.

CHEST: A rounded masslike opacity is seen in the central left lower
lobe which measures 5.2 x 4.7 cm on image 66/4, and is
hypermetabolic with SUV max of 4.9. This masslike opacity appears to
obstruct the posterior left lower lobe bronchus, resulting in left
lower lobe postobstructive atelectasis and pneumonitis. Another
focal area of hypermetabolic activity is seen in the posteroinferior
left lower lobe which measures approximately 4 cm and has an SUV max
of 6.6. This may represent another left lower lobe pulmonary mass.

A moderate multiloculated left pleural effusion is seen without
hypermetabolic activity. Diffuse left lung interstitial thickening
is seen, suspicious for lymphangitic spread of carcinoma.

Mild lymphadenopathy is seen throughout the mediastinum, bilateral
hilar regions, left internal mammary chain, and left subpectoral and
axillary regions, which is hypermetabolic and consistent with
metastatic disease. Index lymph node in the subcarinal region
measures 2.0 cm on image 61/4, with SUV max of 5.9.

Incidental CT findings: Small to moderate pericardial effusion,
without associated hypermetabolic activity.

ABDOMEN/PELVIS: No abnormal hypermetabolic activity within the
liver, pancreas, or spleen. A 2.4 cm left adrenal mass is seen which
shows mild FDG uptake with SUV max of 2.8. This is suspicious for
adrenal metastasis. A 1.0 cm left retrocrural lymph node is seen on
image 82/4 which is hypermetabolic, with SUV max of 4.2. No other
hypermetabolic lymph nodes in the abdomen or pelvis.

Incidental CT findings:  None.

SKELETON: No focal hypermetabolic bone lesions to suggest skeletal
metastasis.

Incidental CT findings:  None.
IMPRESSION: Hypermetabolic masslike opacities in the central left lower lobe and
possibly the posterior-inferior left lower lobe, consistent with
primary bronchogenic carcinoma.

Moderate multiloculated left pleural effusion, and findings
consistent with diffuse left lung lymphangitic spread of carcinoma.

Hypermetabolic lymphadenopathy throughout the chest, lower neck, and
left retrocrural region, consistent with metastatic disease.

Probable left adrenal metastasis.

Small to moderate pericardial effusion, without associated FDG
activity.
